# Patient Record
Sex: Male | Born: 1940 | Race: White | Hispanic: No | Marital: Married | Smoking: Never smoker
Health system: Southern US, Community
[De-identification: ages and names within clinical notes are randomized; demographics above are authoritative.]

## PROBLEM LIST (undated history)

## (undated) DIAGNOSIS — C61 Malignant neoplasm of prostate: Secondary | ICD-10-CM

## (undated) DIAGNOSIS — D649 Anemia, unspecified: Secondary | ICD-10-CM

## (undated) DIAGNOSIS — M1990 Unspecified osteoarthritis, unspecified site: Secondary | ICD-10-CM

## (undated) DIAGNOSIS — G709 Myoneural disorder, unspecified: Secondary | ICD-10-CM

## (undated) DIAGNOSIS — C189 Malignant neoplasm of colon, unspecified: Secondary | ICD-10-CM

## (undated) DIAGNOSIS — N529 Male erectile dysfunction, unspecified: Secondary | ICD-10-CM

## (undated) DIAGNOSIS — K4090 Unilateral inguinal hernia, without obstruction or gangrene, not specified as recurrent: Secondary | ICD-10-CM

## (undated) DIAGNOSIS — I739 Peripheral vascular disease, unspecified: Secondary | ICD-10-CM

## (undated) HISTORY — DX: Male erectile dysfunction, unspecified: N52.9

## (undated) HISTORY — PX: BACK SURGERY: SHX140

## (undated) HISTORY — DX: Malignant neoplasm of prostate: C61

## (undated) HISTORY — PX: TONSILLECTOMY: SUR1361

---
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
## 2002-03-27 HISTORY — PX: SPINE SURGERY: SHX786

## 2002-06-01 ENCOUNTER — Encounter: Admission: RE | Admit: 2002-06-01 | Discharge: 2002-06-01 | Payer: Self-pay | Admitting: Family Medicine

## 2002-06-01 ENCOUNTER — Encounter: Payer: Self-pay | Admitting: Family Medicine

## 2002-06-05 ENCOUNTER — Encounter: Admission: RE | Admit: 2002-06-05 | Discharge: 2002-06-05 | Payer: Self-pay | Admitting: Family Medicine

## 2002-06-23 ENCOUNTER — Encounter: Payer: Self-pay | Admitting: Neurosurgery

## 2002-06-23 ENCOUNTER — Ambulatory Visit (HOSPITAL_COMMUNITY): Admission: RE | Admit: 2002-06-23 | Discharge: 2002-06-24 | Payer: Self-pay | Admitting: Neurosurgery

## 2002-10-30 ENCOUNTER — Encounter: Payer: Self-pay | Admitting: Neurosurgery

## 2002-10-30 ENCOUNTER — Inpatient Hospital Stay (HOSPITAL_COMMUNITY): Admission: RE | Admit: 2002-10-30 | Discharge: 2002-10-31 | Payer: Self-pay | Admitting: Neurosurgery

## 2007-09-04 ENCOUNTER — Telehealth: Payer: Self-pay | Admitting: Family Medicine

## 2007-09-04 ENCOUNTER — Ambulatory Visit: Payer: Self-pay | Admitting: Family Medicine

## 2007-09-04 DIAGNOSIS — L259 Unspecified contact dermatitis, unspecified cause: Secondary | ICD-10-CM | POA: Insufficient documentation

## 2007-09-06 ENCOUNTER — Ambulatory Visit: Payer: Self-pay | Admitting: Family Medicine

## 2007-09-06 LAB — CONVERTED CEMR LAB
Bilirubin Urine: NEGATIVE
Blood in Urine, dipstick: NEGATIVE
Glucose, Urine, Semiquant: NEGATIVE
Nitrite: NEGATIVE
Protein, U semiquant: NEGATIVE
Specific Gravity, Urine: 1.02
Urobilinogen, UA: 0.2
WBC Urine, dipstick: NEGATIVE
pH: 7

## 2007-09-11 ENCOUNTER — Encounter: Payer: Self-pay | Admitting: Family Medicine

## 2007-09-11 LAB — CONVERTED CEMR LAB
ALT: 19 units/L (ref 0–53)
AST: 25 units/L (ref 0–37)
Albumin: 4.4 g/dL (ref 3.5–5.2)
Alkaline Phosphatase: 51 units/L (ref 39–117)
BUN: 25 mg/dL — ABNORMAL HIGH (ref 6–23)
Basophils Absolute: 0 10*3/uL (ref 0.0–0.1)
Basophils Relative: 0.2 % (ref 0.0–1.0)
Bilirubin, Direct: 0.1 mg/dL (ref 0.0–0.3)
CO2: 30 meq/L (ref 19–32)
Calcium: 9.6 mg/dL (ref 8.4–10.5)
Chloride: 104 meq/L (ref 96–112)
Cholesterol: 201 mg/dL (ref 0–200)
Creatinine, Ser: 0.9 mg/dL (ref 0.4–1.5)
Direct LDL: 110.6 mg/dL
Eosinophils Absolute: 0 10*3/uL (ref 0.0–0.7)
Eosinophils Relative: 0.1 % (ref 0.0–5.0)
GFR calc Af Amer: 109 mL/min
GFR calc non Af Amer: 90 mL/min
Glucose, Bld: 97 mg/dL (ref 70–99)
HCT: 41.4 % (ref 39.0–52.0)
HDL: 69.6 mg/dL (ref >39.0)
Hemoglobin: 14.3 g/dL (ref 13.0–17.0)
Lymphocytes Relative: 19.6 % (ref 12.0–46.0)
MCHC: 34.6 g/dL (ref 30.0–36.0)
MCV: 95.9 fL (ref 78.0–100.0)
Monocytes Absolute: 0.6 10*3/uL (ref 0.1–1.0)
Monocytes Relative: 5.8 % (ref 3.0–12.0)
Neutro Abs: 7.2 10*3/uL (ref 1.4–7.7)
Neutrophils Relative %: 74.3 % (ref 43.0–77.0)
PSA: 2.49 ng/mL (ref 0.10–4.00)
Platelets: 236 10*3/uL (ref 150–400)
Potassium: 3.8 meq/L (ref 3.5–5.1)
RBC: 4.31 M/uL (ref 4.22–5.81)
RDW: 11.8 % (ref 11.5–14.6)
Sodium: 142 meq/L (ref 135–145)
TSH: 0.43 microintl units/mL (ref 0.35–5.50)
Total Bilirubin: 1.3 mg/dL — ABNORMAL HIGH (ref 0.3–1.2)
Total CHOL/HDL Ratio: 2.9
Total Protein: 6.8 g/dL (ref 6.0–8.3)
Triglycerides: 54 mg/dL (ref 0–149)
VLDL: 11 mg/dL (ref 0–40)
WBC: 9.7 10*3/uL (ref 4.5–10.5)

## 2007-09-13 ENCOUNTER — Ambulatory Visit: Payer: Self-pay | Admitting: Family Medicine

## 2007-09-13 DIAGNOSIS — F528 Other sexual dysfunction not due to a substance or known physiological condition: Secondary | ICD-10-CM | POA: Insufficient documentation

## 2007-11-19 ENCOUNTER — Ambulatory Visit: Payer: Self-pay | Admitting: Family Medicine

## 2007-11-19 DIAGNOSIS — E291 Testicular hypofunction: Secondary | ICD-10-CM | POA: Insufficient documentation

## 2007-11-19 DIAGNOSIS — G5760 Lesion of plantar nerve, unspecified lower limb: Secondary | ICD-10-CM | POA: Insufficient documentation

## 2007-11-27 ENCOUNTER — Encounter: Payer: Self-pay | Admitting: Family Medicine

## 2009-11-09 ENCOUNTER — Ambulatory Visit: Payer: Self-pay | Admitting: Family Medicine

## 2009-11-09 LAB — CONVERTED CEMR LAB
Bilirubin Urine: NEGATIVE
Blood in Urine, dipstick: NEGATIVE
Glucose, Urine, Semiquant: NEGATIVE
Ketones, urine, test strip: NEGATIVE
Nitrite: NEGATIVE
Protein, U semiquant: NEGATIVE
Specific Gravity, Urine: 1.025
Urobilinogen, UA: 0.2
pH: 7

## 2009-11-15 ENCOUNTER — Ambulatory Visit: Payer: Self-pay | Admitting: Family Medicine

## 2009-11-15 DIAGNOSIS — C61 Malignant neoplasm of prostate: Secondary | ICD-10-CM | POA: Insufficient documentation

## 2009-11-15 LAB — CONVERTED CEMR LAB
ALT: 19 units/L (ref 0–53)
AST: 26 units/L (ref 0–37)
Albumin: 4.3 g/dL (ref 3.5–5.2)
Alkaline Phosphatase: 56 units/L (ref 39–117)
BUN: 20 mg/dL (ref 6–23)
Basophils Absolute: 0.1 10*3/uL (ref 0.0–0.1)
Basophils Relative: 0.7 % (ref 0.0–3.0)
Bilirubin, Direct: 0.2 mg/dL (ref 0.0–0.3)
CO2: 33 meq/L — ABNORMAL HIGH (ref 19–32)
Calcium: 9.4 mg/dL (ref 8.4–10.5)
Chloride: 101 meq/L (ref 96–112)
Cholesterol: 210 mg/dL — ABNORMAL HIGH (ref 0–200)
Creatinine, Ser: 0.8 mg/dL (ref 0.4–1.5)
Direct LDL: 112.7 mg/dL
Eosinophils Absolute: 0.4 10*3/uL (ref 0.0–0.7)
Eosinophils Relative: 4.9 % (ref 0.0–5.0)
GFR calc non Af Amer: 96.83 mL/min (ref >60)
Glucose, Bld: 85 mg/dL (ref 70–99)
HCT: 42.4 % (ref 39.0–52.0)
HDL: 91.6 mg/dL (ref >39.00)
Hemoglobin: 14.1 g/dL (ref 13.0–17.0)
Lymphocytes Relative: 22.6 % (ref 12.0–46.0)
Lymphs Abs: 1.8 10*3/uL (ref 0.7–4.0)
MCHC: 33.4 g/dL (ref 30.0–36.0)
MCV: 97.9 fL (ref 78.0–100.0)
Monocytes Absolute: 0.7 10*3/uL (ref 0.1–1.0)
Monocytes Relative: 8.8 % (ref 3.0–12.0)
Neutro Abs: 5 10*3/uL (ref 1.4–7.7)
Neutrophils Relative %: 63 % (ref 43.0–77.0)
PSA: 6.11 ng/mL — ABNORMAL HIGH (ref 0.10–4.00)
Platelets: 204 10*3/uL (ref 150.0–400.0)
Potassium: 4.6 meq/L (ref 3.5–5.1)
RBC: 4.33 M/uL (ref 4.22–5.81)
RDW: 13.4 % (ref 11.5–14.6)
Sodium: 143 meq/L (ref 135–145)
TSH: 0.69 microintl units/mL (ref 0.35–5.50)
Total Bilirubin: 0.8 mg/dL (ref 0.3–1.2)
Total CHOL/HDL Ratio: 2
Total Protein: 6.4 g/dL (ref 6.0–8.3)
Triglycerides: 32 mg/dL (ref 0.0–149.0)
VLDL: 6.4 mg/dL (ref 0.0–40.0)
WBC: 7.9 10*3/uL (ref 4.5–10.5)

## 2009-12-14 ENCOUNTER — Encounter: Payer: Self-pay | Admitting: Family Medicine

## 2009-12-28 ENCOUNTER — Encounter (INDEPENDENT_AMBULATORY_CARE_PROVIDER_SITE_OTHER): Payer: Self-pay | Admitting: *Deleted

## 2010-01-04 ENCOUNTER — Encounter: Payer: Self-pay | Admitting: Family Medicine

## 2010-01-18 ENCOUNTER — Encounter: Payer: Self-pay | Admitting: Family Medicine

## 2010-02-01 ENCOUNTER — Encounter: Payer: Self-pay | Admitting: Family Medicine

## 2010-02-23 ENCOUNTER — Encounter: Payer: Self-pay | Admitting: Family Medicine

## 2010-03-02 ENCOUNTER — Ambulatory Visit
Admission: RE | Admit: 2010-03-02 | Discharge: 2010-04-26 | Payer: Self-pay | Source: Home / Self Care | Attending: Radiation Oncology | Admitting: Radiation Oncology

## 2010-03-03 ENCOUNTER — Encounter: Payer: Self-pay | Admitting: Family Medicine

## 2010-03-14 ENCOUNTER — Encounter
Admission: RE | Admit: 2010-03-14 | Discharge: 2010-03-14 | Payer: Self-pay | Source: Home / Self Care | Attending: Urology | Admitting: Urology

## 2010-04-11 LAB — COMPREHENSIVE METABOLIC PANEL
ALT: 27 U/L (ref 0–53)
AST: 26 U/L (ref 0–37)
Albumin: 4.3 g/dL (ref 3.5–5.2)
Alkaline Phosphatase: 57 U/L (ref 39–117)
BUN: 18 mg/dL (ref 6–23)
CO2: 33 mEq/L — ABNORMAL HIGH (ref 19–32)
Calcium: 10.1 mg/dL (ref 8.4–10.5)
Chloride: 103 mEq/L (ref 96–112)
Creatinine, Ser: 0.87 mg/dL (ref 0.4–1.5)
GFR calc Af Amer: >60 mL/min (ref >60)
GFR calc non Af Amer: >60 mL/min (ref >60)
Glucose, Bld: 76 mg/dL (ref 70–99)
Potassium: 4 mEq/L (ref 3.5–5.1)
Sodium: 144 mEq/L (ref 135–145)
Total Bilirubin: 1 mg/dL (ref 0.3–1.2)
Total Protein: 6.9 g/dL (ref 6.0–8.3)

## 2010-04-11 LAB — CBC
HCT: 43.9 % (ref 39.0–52.0)
Hemoglobin: 14.7 g/dL (ref 13.0–17.0)
MCH: 31.8 pg (ref 26.0–34.0)
MCHC: 33.5 g/dL (ref 30.0–36.0)
MCV: 95 fL (ref 78.0–100.0)
Platelets: 236 10*3/uL (ref 150–400)
RBC: 4.62 MIL/uL (ref 4.22–5.81)
RDW: 12.5 % (ref 11.5–15.5)
WBC: 5.9 10*3/uL (ref 4.0–10.5)

## 2010-04-11 LAB — PROTIME-INR
INR: 1.01 (ref 0.00–1.49)
Prothrombin Time: 13.5 seconds (ref 11.6–15.2)

## 2010-04-11 LAB — APTT: aPTT: 35 seconds (ref 24–37)

## 2010-04-14 ENCOUNTER — Encounter: Payer: Self-pay | Admitting: Family Medicine

## 2010-04-14 ENCOUNTER — Ambulatory Visit
Admission: RE | Admit: 2010-04-14 | Discharge: 2010-04-14 | Payer: Self-pay | Source: Home / Self Care | Attending: Urology | Admitting: Urology

## 2010-04-19 NOTE — Op Note (Signed)
  NAME:  Oscar Sparks, [name]                ACCOUNT NO.:  [id number]  MEDICAL RECORD NO.:  [id number]          PATIENT TYPE:  AMB  LOCATION:  [facility]                         FACILITY:  [facility]  PHYSICIAN:  Rogerio Boutelle I. Patsi Sears, M.D.DATE OF BIRTH:  31-Oct-1940  DATE OF PROCEDURE: DATE OF DISCHARGE:                              OPERATIVE REPORT   PREOPERATIVE DIAGNOSIS:  Adenocarcinoma of the prostate.  POSTOPERATIVE DIAGNOSIS:  Adenocarcinoma of the prostate.  OPERATION:  I-125 radioactive seed implantation.  SURGEON:  Lizet Kelso I. Patsi Sears, M.D.  RADIATION THERAPIST:  Maryln Gottron, M.D.  PREPARATION:  After appropriate preanesthesia, the patient was brought to the operating room, placed on the operating table in dorsal supine position where general LMA anesthesia was induced.  He was then re- placed in the dorsal lithotomy position where the pubis was prepped with Betadine solution and draped in usual fashion.  REVIEW OF HISTORY:  This 70 year old male has had PSA of 8.25 and history of carcinoma of the prostate in multiple areas including the right apex and right mid prostate, right base, left apex, left lateral prostate and left mid base.  All his cancers are Gleason 3+3=6, in 6/12 biopsies.  He has no voiding symptoms and gland size of 33.6 cc.  PROCEDURE DETAILS:  With the patient in the lithotomy position, a total number of 33 needles were placed in the prostate, with 145 Gy total radiation dose delivered in 67 seeds.  The patient tolerated the procedure well.  Cystourethroscopy at the end of procedure revealed no seeds in the urethra or the bladder.  There was no blood in the bladder. Foley catheter was placed.  The patient was given IV Toradol, awakened and taken to recovery room in good condition.     Estephanie Hubbs I. Patsi Sears, M.D.     SIT/MEDQ  D:  04/14/2010  T:  04/14/2010  Job:  [id number]  Electronically Signed by Jethro Bolus M.D. on 04/19/2010 01:43:50 PM

## 2010-04-24 LAB — CONVERTED CEMR LAB: Testosterone: 394.27 ng/dL (ref 350.00–890)

## 2010-04-26 NOTE — Consult Note (Signed)
Summary: [facility]   Imported By: Maryln Gottron 12/21/2009 12:47:30  _____________________________________________________________________  External Attachment:    Type:   Image     Comment:   External Document

## 2010-04-26 NOTE — Letter (Signed)
Summary: [facility]  [facility]   Imported By: Maryln Gottron 03/01/2010 11:01:48  _____________________________________________________________________  External Attachment:    Type:   Image     Comment:   External Document

## 2010-04-26 NOTE — Consult Note (Signed)
Summary: Dr Charlsie Merles note  Dr Charlsie Merles note   Imported By: Kassie Mends 12/09/2007 13:28:43  _____________________________________________________________________  External Attachment:    Type:   Image     Comment:   Dr Charlsie Merles note

## 2010-04-26 NOTE — Assessment & Plan Note (Signed)
Summary: new acute poison sumac?/mhf   Vital Signs:  Patient Profile:   70 Years Old Male Height:     71.5 inches Weight:      156 pounds Temp:     98.0 degrees F oral Pulse rate:   71 / minute Pulse rhythm:   regular BP sitting:   119 / 78  (left arm) Cuff size:   regular  Vitals Entered By: Alfred Levins, CMA (September 04, 2007 11:31 AM)                 Chief Complaint:  establish and poison ivy.  History of Present Illness: 70 yr old male to re- establish with [place] after an absence of 5 years. Has been well for the most part. After working in his yard last weekend he developed an itchy rash over both arms and both legs. Has used Benadryl and Calamine.     Current Allergies (reviewed today): No known allergies   Past Medical History:    Reviewed history and no changes required:       Unremarkable  Past Surgical History:    Reviewed history and no changes required:       Lumbar spine 2004 per Dr. Newell Coral       Tonsillectomy   Family History:    Reviewed history and no changes required:       Family History Breast cancer 1st degree relative <50  Social History:    Reviewed history and no changes required:       Married       Never Smoked       Alcohol use-no       Drug use-no       Regular exercise-no   Risk Factors:  Tobacco use:  never Drug use:  no Alcohol use:  no Exercise:  no   Review of Systems  The patient denies anorexia, fever, weight loss, weight gain, vision loss, decreased hearing, hoarseness, chest pain, syncope, dyspnea on exertion, peripheral edema, prolonged cough, headaches, hemoptysis, abdominal pain, melena, hematochezia, severe indigestion/heartburn, hematuria, incontinence, genital sores, muscle weakness, suspicious skin lesions, transient blindness, difficulty walking, depression, unusual weight change, abnormal bleeding, enlarged lymph nodes, angioedema, breast masses, and testicular masses.     Physical Exam  General:  Well-developed,well-nourished,in no acute distress; alert,appropriate and cooperative throughout examination Skin:     diffuse red papulovessicular rash as above    Impression & Recommendations:  Problem # 1:  CONTACT DERMATITIS (ICD-692.9)  His updated medication list for this problem includes:    Sterapred Ds 12 Day 10 Mg Tabs (Prednisone) .Marland Kitchen... As directed   Complete Medication List: 1)  Sterapred Ds 12 Day 10 Mg Tabs (Prednisone) .... As directed   Patient Instructions: 1)  Add Aveeno tub soaks. Will set up cpx soon.   Prescriptions: STERAPRED DS 12 DAY 10 MG  TABS (PREDNISONE) as directed  #1 x 0   Entered and Authorized by:   Nelwyn Salisbury MD   Signed by:   Nelwyn Salisbury MD on 09/04/2007   Method used:   Electronically sent to ...       [facility]  [address]  [phone]*       [address]       [place], [place]  [address]       Ph: [phone] or [phone]       Fax: [phone]   RxID:   [id number]  ]

## 2010-04-26 NOTE — Letter (Signed)
Summary: [facility] Referral (unable to schedule) Notification  [facility]  [address] [place], [place] [address]   Phone: [phone]  Fax: [phone]      December 28, 2009 Oscar Sparks [birth year] MRN: [id number]   [facility] [address] [place], [place]  [address]   Dear Dr. Clent Ridges:   Thank you for your kind referral of the above patient. We have attempted to schedule the recommended Colonoscopy but have been unable to schedule because:  _x_ The patient was not available by phone and/or has not returned our calls.  __ The patient declined to schedule the procedure at this time.  We appreciate the referral and hope that we will have the opportunity to treat this patient in the future.    Sincerely,   [facility]  Vania Rea. Jarold Motto M.D. Hedwig Morton. Juanda Chance M.D. Venita Lick. Russella Dar M.D. Wilhemina Bonito. Marina Goodell M.D. Barbette Hair. Arlyce Dice M.D. Iva Boop M.D. Cheron Every.D.

## 2010-04-26 NOTE — Letter (Signed)
Summary: Lipid Letter  [facility]  [address] [place], [place] [address]   Phone: [phone]  Fax: [phone]    09/11/2007  Orvil Feil [address] [place], [place]  [address]  Dear Oscar Sparks:  We have carefully reviewed your last lipid profile from 09/06/2007 and the results are noted below with a summary of recommendations for lipid management.    Cholesterol:       201     Goal: <200   HDL "good" Cholesterol:   34.7     Goal: >39   LDL "bad" Cholesterol:   110.6     Goal: <100   Triglycerides:       54     Goal: <149        TLC Diet (Therapeutic Lifestyle Change): Saturated Fats & Transfatty acids should be kept < 7% of total calories ***Reduce Saturated Fats Polyunstaurated Fat can be up to 10% of total calories Monounsaturated Fat Fat can be up to 20% of total calories Total Fat should be no greater than 25-35% of total calories Carbohydrates should be 50-60% of total calories Protein should be approximately 15% of total calories Fiber should be at least 20-30 grams a day ***Increased fiber may help lower LDL Total Cholesterol should be < 200mg /day Consider adding plant stanol/sterols to diet (example: Benacol spread) ***A higher intake of unsaturated fat may reduce Triglycerides and Increase HDL    Adjunctive Measures (may lower LIPIDS and reduce risk of Heart Attack) include: Aerobic Exercise (20-30 minutes 3-4 times a week) Limit Alcohol Consumption Weight Reduction Aspirin 75-81 mg a day by mouth (if not allergic or contraindicated) Vitamin E 400 IU a day by mouth Folic Acid 1mg  a day by mouth Dietary Fiber 20-30 grams a day by mouth   If you have any questions, please call. We appreciate being able to work with you.   Sincerely,    [facility]

## 2010-04-26 NOTE — Letter (Signed)
Summary: [facility]  [facility]   Imported By: Maryln Gottron 01/24/2010 10:29:31  _____________________________________________________________________  External Attachment:    Type:   Image     Comment:   External Document

## 2010-04-26 NOTE — Assessment & Plan Note (Signed)
Summary: cpx/njr   Vital Signs:  Patient profile:   70 year old male Weight:      149 pounds BMI:     20.57 BP sitting:   116 / 64  (left arm) Cuff size:   regular  Vitals Entered By: Raechel Ache, RN (November 15, 2009 10:14 AM) CC: CPX, labs done. C/o weight loss.   History of Present Illness: 70 yr old man for a cpx. He has not seen [place] for 2 years. he feels fine but he has lost some weight. By our records he has lost 10 lbs in the past 2 years. His appetite is good, he eats a well balanced diet, and he exercises regularly. He walks about 4-5 miles every day. When we gave him a cpx 2 years ago, he declined a colonoscopy, but he is interested in this now. Of note, his PSA has tripled in the past 2 years, and I am concerned that his weight loss could be related to a malignancy.   Allergies (verified): No Known Drug Allergies  Past History:  Past Medical History: ED low testosterone sees Dr. Nita Sells for dermatology exams  Past Surgical History: Reviewed history from 09/04/2007 and no changes required. Lumbar spine 2004 per Dr. Newell Coral Tonsillectomy  Family History: Reviewed history from 09/04/2007 and no changes required. Family History Breast cancer 1st degree relative <50  Social History: Reviewed history from 09/04/2007 and no changes required. Married Never Smoked Alcohol use-no Drug use-no Regular exercise-no  Review of Systems  The patient denies anorexia, fever, weight gain, vision loss, decreased hearing, hoarseness, chest pain, syncope, dyspnea on exertion, peripheral edema, prolonged cough, headaches, hemoptysis, abdominal pain, melena, hematochezia, severe indigestion/heartburn, hematuria, incontinence, genital sores, muscle weakness, suspicious skin lesions, transient blindness, difficulty walking, depression, unusual weight change, abnormal bleeding, enlarged lymph nodes, angioedema, breast masses, and testicular masses.    Physical Exam  General:   thin, alert Head:  Normocephalic and atraumatic without obvious abnormalities. No apparent alopecia or balding. Eyes:  No corneal or conjunctival inflammation noted. EOMI. Perrla. Funduscopic exam benign, without hemorrhages, exudates or papilledema. Vision grossly normal. Ears:  External ear exam shows no significant lesions or deformities.  Otoscopic examination reveals clear canals, tympanic membranes are intact bilaterally without bulging, retraction, inflammation or discharge. Hearing is grossly normal bilaterally. Nose:  External nasal examination shows no deformity or inflammation. Nasal mucosa are pink and moist without lesions or exudates. Mouth:  Oral mucosa and oropharynx without lesions or exudates.  Teeth in good repair. Neck:  No deformities, masses, or tenderness noted. Chest Wall:  No deformities, masses, tenderness or gynecomastia noted. Lungs:  Normal respiratory effort, chest expands symmetrically. Lungs are clear to auscultation, no crackles or wheezes. Heart:  Normal rate and regular rhythm. S1 and S2 normal without gallop, murmur, click, rub or other extra sounds. 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 Rectal:  No external abnormalities noted. Normal sphincter tone. No rectal masses or tenderness. Heme neg. Genitalia:  Testes bilaterally descended without nodularity, tenderness or masses. No scrotal masses or lesions. No penis lesions or urethral discharge. Prostate:  overall size is normal but it is firm, and the right lobe is slightly larger than the left. There is a firm nodule on the right also. Not tender Msk:  No deformity or scoliosis noted of thoracic or lumbar spine.   Pulses:  R and L carotid,radial,femoral,dorsalis pedis and posterior  tibial pulses are full and equal bilaterally Extremities:  No clubbing, cyanosis, edema, or deformity noted with normal full  range of motion of all joints.   Neurologic:  No cranial nerve deficits noted. Station and gait are normal. Plantar reflexes are down-going bilaterally. DTRs are symmetrical throughout. Sensory, motor and coordinative functions appear intact. Skin:  Intact without suspicious lesions or rashes Cervical Nodes:  No lymphadenopathy noted Axillary Nodes:  No palpable lymphadenopathy Inguinal Nodes:  No significant adenopathy Psych:  Cognition and judgment appear intact. Alert and cooperative with normal attention span and concentration. No apparent delusions, illusions, hallucinations   Impression & Recommendations:  Problem # 1:  WELL ADULT EXAM (ICD-V70.0)  Orders: Hemoccult Guaiac-1 spec.(in office) (82270) EKG w/ Interpretation (93000)  Complete Medication List: 1)  Fish Oil 1000 Mg Caps (Omega-3 fatty acids) .... Once daily 2)  Glucosamine Chondroitin Complx Caps (Glucosamine-chondroit-biofl-mn) .... Two times a day 3)  Viagra 100 Mg Tabs (Sildenafil citrate) .... As needed  Other Orders: Urology Referral (Urology) Gastroenterology Referral (GI)  Patient Instructions: 1)  Will refer to Urology to evaluate his increased PSA.  2)  Schedule a colonoscopy/ sigmoidoscopy to help detect colon cancer.  Prescriptions: VIAGRA 100 MG TABS (SILDENAFIL CITRATE) as needed  #10 x 11   Entered and Authorized by:   Nelwyn Salisbury MD   Signed by:   Nelwyn Salisbury MD on 11/15/2009   Method used:   Electronically to        [facility]  [phone]* (retail)       [address]       [place], [place]  [address]       Ph: [phone] or [phone]       Fax: [phone]   RxID:   [id number]

## 2010-04-26 NOTE — Letter (Signed)
Summary: [facility]  [facility]   Imported By: Maryln Gottron 01/11/2010 11:07:57  _____________________________________________________________________  External Attachment:    Type:   Image     Comment:   External Document

## 2010-04-26 NOTE — Assessment & Plan Note (Signed)
Summary: cpx/ok per doc/njr   Vital Signs:  Patient Profile:   70 Years Old Male Height:     71.5 inches Weight:      158 pounds Temp:     98.0 degrees F oral Pulse rate:   72 / minute Pulse rhythm:   regular BP sitting:   98 / 72  (left arm) Cuff size:   regular  Vitals Entered By: Alfred Levins, CMA (September 13, 2007 9:22 AM)                 Chief Complaint:  cpx.  History of Present Illness: 70 yr old male for cpx. He has concerns over the past year over some weight loss and some loss of libido. His desire for sex is down, and he has had some erection difficulties as well. He continues to exercise and walks 10 miles a day. He eats well. Has lost some strength and some muscle mass in spite of this. All his cpx labs are normal today.    Current Allergies (reviewed today): No known allergies   Past Medical History:    Reviewed history from 09/04/2007 and no changes required:       Unremarkable       sees a dermatologist yearly  Past Surgical History:    Reviewed history from 09/04/2007 and no changes required:       Lumbar spine 2004 per Dr. Newell Coral       Tonsillectomy   Family History:    Reviewed history from 09/04/2007 and no changes required:       Family History Breast cancer 1st degree relative <50  Social History:    Reviewed history from 09/04/2007 and no changes required:       Married       Never Smoked       Alcohol use-no       Drug use-no       Regular exercise-no    Review of Systems  The patient denies anorexia, fever, weight gain, vision loss, decreased hearing, hoarseness, chest pain, syncope, dyspnea on exertion, peripheral edema, prolonged cough, headaches, hemoptysis, abdominal pain, melena, hematochezia, severe indigestion/heartburn, hematuria, incontinence, genital sores, muscle weakness, suspicious skin lesions, transient blindness, difficulty walking, depression, unusual weight change, abnormal bleeding, enlarged lymph nodes, angioedema,  breast masses, and testicular masses.     Physical Exam  General:     Well-developed,well-nourished,in no acute distress; alert,appropriate and cooperative throughout examination Head:     Normocephalic and atraumatic without obvious abnormalities. No apparent alopecia or balding. Eyes:     No corneal or conjunctival inflammation noted. EOMI. Perrla. Funduscopic exam benign, without hemorrhages, exudates or papilledema. Vision grossly normal. Ears:     External ear exam shows no significant lesions or deformities.  Otoscopic examination reveals clear canals, tympanic membranes are intact bilaterally without bulging, retraction, inflammation or discharge. Hearing is grossly normal bilaterally. Nose:     External nasal examination shows no deformity or inflammation. Nasal mucosa are pink and moist without lesions or exudates. Mouth:     Oral mucosa and oropharynx without lesions or exudates.  Teeth in good repair. Neck:     No deformities, masses, or tenderness noted. Chest Wall:     No deformities, masses, tenderness or gynecomastia noted. Lungs:     Normal respiratory effort, chest expands symmetrically. Lungs are clear to auscultation, no crackles or wheezes. Heart:     Normal rate and regular rhythm. S1 and S2 normal without gallop, murmur, click, rub  or other extra sounds. EKG normal except for some first degree AV block. Abdomen:     Bowel sounds positive,abdomen soft and non-tender without masses, organomegaly or hernias noted. Rectal:     No external abnormalities noted. Normal sphincter tone. No rectal masses or tenderness. Heme neg. Genitalia:     Testes bilaterally descended without nodularity, tenderness or masses. No scrotal masses or lesions. No penis lesions or urethral discharge. Prostate:     Prostate gland firm and smooth, no enlargement, nodularity, tenderness, mass, asymmetry or induration. Msk:     No deformity or scoliosis noted of thoracic or lumbar spine.     Pulses:     R and L carotid,radial,femoral,dorsalis pedis and posterior tibial pulses are full and equal bilaterally Extremities:     No clubbing, cyanosis, edema, or deformity noted with normal full range of motion of all joints.   Neurologic:     No cranial nerve deficits noted. Station and gait are normal. Plantar reflexes are down-going bilaterally. DTRs are symmetrical throughout. Sensory, motor and coordinative functions appear intact. Skin:     Intact without suspicious lesions or rashes Cervical Nodes:     No lymphadenopathy noted Axillary Nodes:     No palpable lymphadenopathy Inguinal Nodes:     No significant adenopathy Psych:     Cognition and judgment appear intact. Alert and cooperative with normal attention span and concentration. 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 As needed   Complete Medication List: 1)  Cialis 20 Mg Tabs (Tadalafil) .... As needed   Patient Instructions: 1)  Seems quite healthy, but he may well have had a drop in his testosterone level over the past year. Check a level today. Try samples for Cialis.    Prescriptions: CIALIS 20 MG  TABS (TADALAFIL) as needed  #3 x 0   Entered and Authorized by:   Nelwyn Salisbury MD   Signed by:   Nelwyn Salisbury MD on 09/13/2007   Method used:   Samples Given   RxID:   [id number]  ]

## 2010-04-26 NOTE — Letter (Signed)
[facility]   Imported By: Sherian Rein 02/09/2010 09:44:16  _____________________________________________________________________  External Attachment:    Type:   Image     Comment:   External Document

## 2010-04-26 NOTE — Progress Notes (Signed)
Summary: WORK IN CPX  Phone Note Call from Patient Call back at Home Phone [phone] Call back at [phone]   Caller: Patient Call For: DR FRY Summary of Call: PT WOULD LIKE TO BE WORK IN FOR CPX ASAP. Initial call taken by: Heron Sabins,  September 04, 2007 12:17 PM  Follow-up for Phone Call        ok Follow-up by: Nelwyn Salisbury MD,  September 04, 2007 12:21 PM  Additional Follow-up for Phone Call Additional follow up Details #1::        lmom at cell# Additional Follow-up by: Heron Sabins,  September 05, 2007 12:48 PM    Additional Follow-up for Phone Call Additional follow up Details #2::    cpx labs 09-06-07 9am cpx 09-13-07 9.45a Follow-up by: Heron Sabins,  September 05, 2007 4:50 PM

## 2010-04-26 NOTE — Assessment & Plan Note (Signed)
Summary: talk with Dr Bobbye Riggs   Vital Signs:  Patient Profile:   70 Years Old Male Height:     71.5 inches Weight:      159 pounds Temp:     98.1 degrees F oral Pulse rate:   64 / minute Pulse rhythm:   regular BP sitting:   112 / 66  (left arm) Cuff size:   regular  Vitals Entered By: Alfred Levins, CMA (November 19, 2007 8:47 AM)                 Chief Complaint:  rt foot pain.  History of Present Illness: Here for a painful swelling in the right foot at the 2nd MTP. This has been present for 4 months. No trauma. He does wear supportive shoes. He relates that he is pleased with the Androgel and the Cialis.     Current Allergies: No known allergies   Past Medical History:    Reviewed history from 09/13/2007 and no changes required:       ED       low testosterone       sees a dermatologist yearly     Review of Systems  The patient denies anorexia, fever, weight loss, weight gain, vision loss, decreased hearing, hoarseness, chest pain, syncope, dyspnea on exertion, peripheral edema, prolonged cough, headaches, hemoptysis, abdominal pain, melena, hematochezia, severe indigestion/heartburn, hematuria, incontinence, genital sores, muscle weakness, suspicious skin lesions, transient blindness, difficulty walking, depression, unusual weight change, abnormal bleeding, enlarged lymph nodes, angioedema, breast masses, and testicular masses.     Physical Exam  General:     Well-developed,well-nourished,in no acute distress; alert,appropriate and cooperative throughout examination Msk:     right foot is tender and mildly swollen between the 2nd and 3rd MTP joints.     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 As needed 2)  Androgel Pump 1 % Gel (Testosterone) .... 5g every day   Patient Instructions: 1)  refer to Podiatry   Prescriptions: CIALIS 20 MG  TABS (TADALAFIL) as needed  #10 x 11   Entered and Authorized by:   Nelwyn Salisbury MD   Signed by:   Alfred Levins, CMA on 11/19/2007   Method used:   Electronically to        [facility]  [phone]* (retail)       [address]       [place], [place]  [address]       Ph: [phone] or [phone]       Fax: [phone]   RxID:   [id number]  ]

## 2010-04-27 ENCOUNTER — Ambulatory Visit: Payer: PRIVATE HEALTH INSURANCE | Admitting: Radiation Oncology

## 2010-04-28 NOTE — Letter (Signed)
Summary: [facility]  [facility]   Imported By: Maryln Gottron 04/21/2010 14:25:48  _____________________________________________________________________  External Attachment:    Type:   Image     Comment:   External Document

## 2010-04-28 NOTE — Letter (Signed)
Summary: [facility]  [facility]   Imported By: Lennie Odor 03/15/2010 14:21:50  _____________________________________________________________________  External Attachment:    Type:   Image     Comment:   External Document

## 2010-05-18 ENCOUNTER — Ambulatory Visit: Payer: PRIVATE HEALTH INSURANCE | Attending: Radiation Oncology | Admitting: Radiation Oncology

## 2010-05-18 DIAGNOSIS — C61 Malignant neoplasm of prostate: Secondary | ICD-10-CM | POA: Insufficient documentation

## 2010-08-12 NOTE — Op Note (Signed)
NAME:  Oscar Sparks, Oscar Sparks                          ACCOUNT NO.:  [id number]   MEDICAL RECORD NO.:  [id number]                   PATIENT TYPE:  INP   LOCATION:  NA                                   FACILITY:  [facility]   PHYSICIAN:  Hewitt Shorts, M.D.            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   POSTOPERATIVE DIAGNOSIS:  Right L4-5 lumbar disk herniation.   OPERATION PERFORMED:  Right L4 hemilaminectomy and microdiskectomy with  microdissection.   SURGEON:  Hewitt Shorts, M.D.   ASSISTANT:  Payton Doughty, M.D.   ANESTHESIA:  General endotracheal.   INDICATIONS FOR PROCEDURE:  The patient is a 70 year old man who presented  with a right lumbar radiculopathy.  He was found by MRI scan to have a  lumbar disk herniation that is located on the right side behind the body of  L4 compressing the L4 nerve root as it exited into the L4-5 nerve root  foramen.  Decision was made to proceed with hemilaminectomy and  microdiskectomy.   DESCRIPTION OF PROCEDURE:  The patient was brought to the operating room and  placed under general endotracheal anesthesia.  The patient was turned to a  prone position and the lumbar region was prepped with Betadine soap and  solution and draped in sterile fashion.  The midline was infiltrated with  local anesthetic with epinephrine.  An x-ray was taken.  The L4 level  identified and a midline incision was made and carried down through the  subcutaneous tissue.  Bipolar cautery and electrocautery were used to  maintain hemostasis.  Dissection was carried down to the lumbar fascia which  was incised on the right side of midline and the paraspinal muscles were  dissected from the spinous process and lamina in subperiosteal fashion.  The  L4 lamina was identified.  An x-ray was taken to confirm the  localization.  The operating microscope was draped and brought into the field to provide  additional magnification, illumination and visualization and the remainder  of the procedure was performed using microdissection and microsurgical  technique.  A hemilaminectomy was performed using the [facility] Max drill and  Kerrison punches.  We identified the thecal sac and then explored the  epidural space and identified the disk herniation.  It was located ventral  to the right L4 nerve root.  The disk herniation was removed in a piecemeal  fashion.  All loose fragments of disk material were removed and good  decompression of the right L4 nerve root was achieved.  We did not enter  into the disk space.  There was no obvious rent in the disk space.  Once the  diskectomy was completed, hemostasis was established with the use of bipolar  cautery as well as  Gelfoam soaked in thrombin.  All the Gelfoam, though, was  removed prior to closure.  We did instill 2 cc of fentanyl and 80 mg of Depo-  Medrol in the epidural space and then proceeded with closure.  The deep  fascia was closed with interrupted undyed 1 Vicryl suture and subcutaneous  and subcuticular layer were closed with interrupted inverted 2-0 undyed  Vicryl sutures and  the skin edges were reapproximated with Dermabond. The patient tolerated the  procedure well.  The estimated blood loss was 50 cc.  Sponge and needle  counts were correct.  Following surgery, the patient was turned back to  supine position, reversed from anesthetic, extubated and transferred to  recovery room for further care.                                                Hewitt Shorts, M.D.    RWN/MEDQ  D:  06/23/2002  T:  06/23/2002  Job:  [id number]

## 2010-08-12 NOTE — Op Note (Signed)
NAME:  Oscar Sparks, Oscar Sparks                          ACCOUNT NO.:  [id number]   MEDICAL RECORD NO.:  [id number]                   PATIENT TYPE:  INP   LOCATION:  3011                                 FACILITY:  [facility]   PHYSICIAN:  Hewitt Shorts, M.D.            DATE OF BIRTH:  October 05, 1940   DATE OF PROCEDURE:  10/30/2002  DATE OF DISCHARGE:                                 OPERATIVE REPORT   PREOPERATIVE DIAGNOSIS:  Recurrent right L4-L5 lumbar herniated disc.   POSTOPERATIVE DIAGNOSIS:  Recurrent right L4-L5 lumbar herniated disc.   PROCEDURE:  Right L4 hemilaminectomy and microdiscectomy with  microdissection.   SURGEON:  Hewitt Shorts, M.D.   ASSISTANT:  Payton Doughty, M.D.   ANESTHESIA:  General endotracheal anesthesia.   INDICATIONS FOR PROCEDURE:  The patient is a 70 year old man who presented  with a recurrent right L4-L5 lumbar disc herniation with a free fragment  that has migrated rostrally into the right L4-L5 nerve root foramen directly  compressing the right L4 nerve root.  The decision was made to proceed with  elective hemilaminectomy and microdiscectomy.   PROCEDURE:  The patient was brought to the operating room and placed under  general endotracheal anesthesia.  The patient was turned to the prone  position and the lumbar region was prepped with DuraPrep and draped in a  sterile fashion.  The previous midline was reopened, dissection was carried  down through the subcutaneous tissue to the lumbar fascia which was incised  on the right side of the midline.  The paraspinal muscles were dissected  from the spinous processes and lamina in a subperiosteal fashion.  The  previous hemilaminectomy was identified and we dissected the scar tissues  from the margins of that hemilaminectomy.  We identified the thecal sac.  The medial facetectomy was slightly extended and we were able to identify  the right L4 nerve roots, the pedicles of the right L4 vertebral body,  and  the right L4-L5 disc space.  An x-ray was taken to help with localization.  The microscope was draped and brought onto the field to provide illumination  and visualization and our dissection was continued using microdissection and  microsurgical technique.  We identified the disc herniation ventral to the  right L4 nerve roots.  We incised over the scar tissue and a fragment of  disc extruded and we were able to further dissect within the foramen and  remove additional fragments of disc herniation and with this, we were able  to decompress the right L4 nerve roots.  Good decompression was achieved.  All loose fragments of disc material were removed from the epidural space  and the foramen.  We did not enter into the disc space and notably, we did  not enter into the disc space at the time of his previous surgery.  Hemostasis was established using bipolar cautery.  Once the discectomy was  completed, we instilled 2 cc of Fentanyl and 80 mg Depo-Medrol into the  epidural space around the nerve root and thecal sac and then proceeded with  closure.  The deep fascia was closed with interrupted undyed #1 Vicryl  suture, the subcutaneous and subcuticular layer was closed with interrupted  inverted 2-0 Vicryl sutures, and the skin  was reapproximated with Dermabond.  The patient tolerated the procedure  well.  Estimated blood loss was 50 mL.  Sponge counts were correct.  Following surgery, the patient was turned back to the supine position,  reversed from the anesthetic, extubated, and transferred to the recovery  room for further care.                                               Hewitt Shorts, M.D.    RWN/MEDQ  D:  10/30/2002  T:  10/30/2002  Job:  [id number]

## 2010-10-07 ENCOUNTER — Other Ambulatory Visit: Payer: Self-pay | Admitting: Family Medicine

## 2010-10-07 ENCOUNTER — Telehealth: Payer: Self-pay | Admitting: Family Medicine

## 2010-10-07 NOTE — Telephone Encounter (Signed)
Please advise 

## 2010-10-07 NOTE — Telephone Encounter (Signed)
Pt is requesting a refill on Prednisone, however Dr. Fabian Sharp said that we could call in some cream. I spoke with pt and he declined.

## 2010-10-07 NOTE — Telephone Encounter (Signed)
Per Dr. Fabian Sharp- needs a office visit first.

## 2010-10-07 NOTE — Telephone Encounter (Signed)
Pt had a Rx for Prednisone for poison ivy. He is out, but he has gotten into poison ivy again while working in the yard. The bottle says no refills, but he is wondering if we can authorize a refill today so he can go to the pharmacy & pick it up. I also recommended that he call the pharmacy and ask them to submit a refill auth request to [place].

## 2010-10-07 NOTE — Telephone Encounter (Signed)
Script was denied by Dr. Fabian Sharp, however she did offer to call in some cream. Pt declined that and wanted me to forward this request to Dr. Clent Ridges.

## 2010-10-10 ENCOUNTER — Telehealth: Payer: Self-pay | Admitting: Family Medicine

## 2010-10-10 NOTE — Telephone Encounter (Signed)
Refill request for Prednison 10 mg 12 day pack. Pt stated that he was working in the yard and must have come into contact with poison ivy. Pt would like a call when & if we can handle this.

## 2010-10-11 NOTE — Telephone Encounter (Signed)
Call in one pack as noted

## 2010-10-12 MED ORDER — PREDNISONE (PAK) 10 MG PO TABS: ORAL_TABLET | Freq: Every day | ORAL | Status: DC

## 2010-10-12 NOTE — Telephone Encounter (Signed)
Call in a 12 day taper pack to take as directed

## 2010-10-12 NOTE — Telephone Encounter (Signed)
Left message on machine for patient

## 2011-03-27 ENCOUNTER — Encounter: Payer: Self-pay | Admitting: *Deleted

## 2011-03-27 NOTE — Progress Notes (Signed)
03/03/2010 I-PSS results=00000001 score+1

## 2011-11-25 ENCOUNTER — Other Ambulatory Visit: Payer: Self-pay | Admitting: Family Medicine

## 2011-11-28 NOTE — Telephone Encounter (Signed)
Pt last seen 11/15/09. Pls advise.

## 2011-12-28 ENCOUNTER — Telehealth: Payer: Self-pay | Admitting: Family Medicine

## 2011-12-28 NOTE — Telephone Encounter (Signed)
Patient called stating that he need a refill of his viagra sent to [facility]. Please assist.

## 2011-12-29 NOTE — Telephone Encounter (Signed)
He needs an OV first.

## 2012-01-01 ENCOUNTER — Ambulatory Visit (INDEPENDENT_AMBULATORY_CARE_PROVIDER_SITE_OTHER): Payer: PRIVATE HEALTH INSURANCE | Admitting: Family Medicine

## 2012-01-01 ENCOUNTER — Encounter: Payer: Self-pay | Admitting: Family Medicine

## 2012-01-01 VITALS — BP 116/76 | HR 90 | Temp 98.4°F | Wt 150.0 lb

## 2012-01-01 DIAGNOSIS — C61 Malignant neoplasm of prostate: Secondary | ICD-10-CM

## 2012-01-01 DIAGNOSIS — N529 Male erectile dysfunction, unspecified: Secondary | ICD-10-CM

## 2012-01-01 MED ORDER — SILDENAFIL CITRATE 100 MG PO TABS: 100.0000 mg | ORAL_TABLET | ORAL | Status: DC | PRN

## 2012-01-01 NOTE — Progress Notes (Signed)
  Subjective:    Patient ID: Oscar Sparks, male    DOB: December 02, 1940, 71 y.o.   MRN: [id number]  HPI Here to follow up after an absence of 2 and 1/2 years. We had noted a rapid rise in his PSA and sent him to Dr. Patsi Sears. His prostate biopsy was positive for early cancer, and he had cesium seeds implanted. This was very successful, and his PSA counts have been down ever since. His PSA from one month ago was 0.99. He feels great and has no concerns, although he asks for a refill on Viagra. His sex life had been quite curtailed during his prostate treatment, but he is now back to normal and he enjoys a healthy relationship with his wife.    Review of Systems  Constitutional: Negative.   Respiratory: Negative.   Cardiovascular: Negative.   Gastrointestinal: Negative.   Genitourinary: Negative.        Objective:   Physical Exam  Constitutional: He appears well-developed and well-nourished.  Neck: No thyromegaly present.  Cardiovascular: Normal rate, regular rhythm, normal heart sounds and intact distal pulses.   Pulmonary/Chest: Effort normal and breath sounds normal.  Lymphadenopathy:    He has no cervical adenopathy.          Assessment & Plan:  He seems to be doing well. Refilled Viagra.

## 2012-02-07 ENCOUNTER — Telehealth: Payer: Self-pay | Admitting: Family Medicine

## 2012-02-07 MED ORDER — TADALAFIL 20 MG PO TABS: 20.0000 mg | ORAL_TABLET | Freq: Every day | ORAL | Status: DC | PRN

## 2012-02-07 NOTE — Telephone Encounter (Signed)
I sent script e-scribe and left voice message for pt 

## 2012-02-07 NOTE — Telephone Encounter (Signed)
Please advise 

## 2012-02-07 NOTE — Telephone Encounter (Signed)
Call in Cialis 20 mg to take prn, #10 with 11 rf 

## 2012-02-07 NOTE — Telephone Encounter (Signed)
Caller: Kalev/Patient; Phone: [phone]; Reason for Call: Calling to report that prescribed medication (Viagra) is not having any effect on his condition.  Please call patient today between 2pm and 3pm to discuss alternate medication.

## 2012-02-07 NOTE — Addendum Note (Signed)
Addended by: Aniceto Boss A on: 02/07/2012 01:49 PM   Modules accepted: Orders

## 2012-09-19 ENCOUNTER — Observation Stay (HOSPITAL_COMMUNITY)
Admission: EM | Admit: 2012-09-19 | Discharge: 2012-09-19 | Disposition: A | Discharge status: Home / Self Care | Payer: PRIVATE HEALTH INSURANCE | Attending: Internal Medicine | Admitting: Internal Medicine

## 2012-09-19 ENCOUNTER — Encounter (HOSPITAL_COMMUNITY): Payer: Self-pay | Admitting: Emergency Medicine

## 2012-09-19 ENCOUNTER — Other Ambulatory Visit: Payer: Self-pay

## 2012-09-19 ENCOUNTER — Emergency Department (HOSPITAL_COMMUNITY): Payer: PRIVATE HEALTH INSURANCE

## 2012-09-19 DIAGNOSIS — Z79899 Other long term (current) drug therapy: Secondary | ICD-10-CM | POA: Insufficient documentation

## 2012-09-19 DIAGNOSIS — R079 Chest pain, unspecified: Secondary | ICD-10-CM

## 2012-09-19 DIAGNOSIS — R072 Precordial pain: Principal | ICD-10-CM | POA: Insufficient documentation

## 2012-09-19 DIAGNOSIS — N529 Male erectile dysfunction, unspecified: Secondary | ICD-10-CM | POA: Insufficient documentation

## 2012-09-19 DIAGNOSIS — I440 Atrioventricular block, first degree: Secondary | ICD-10-CM | POA: Insufficient documentation

## 2012-09-19 LAB — BASIC METABOLIC PANEL
BUN: 21 mg/dL (ref 6–23)
CO2: 26 mEq/L (ref 19–32)
Calcium: 9.7 mg/dL (ref 8.4–10.5)
Chloride: 104 mEq/L (ref 96–112)
Creatinine, Ser: 0.78 mg/dL (ref 0.50–1.35)
GFR calc Af Amer: >90 mL/min (ref >90)
GFR calc non Af Amer: >90 mL/min (ref >90)
Glucose, Bld: 102 mg/dL — ABNORMAL HIGH (ref 70–99)
Potassium: 4.1 mEq/L (ref 3.5–5.1)
Sodium: 139 mEq/L (ref 135–145)

## 2012-09-19 LAB — CBC WITH DIFFERENTIAL/PLATELET
Basophils Absolute: 0 10*3/uL (ref 0.0–0.1)
Basophils Relative: 1 % (ref 0–1)
Eosinophils Absolute: 0.2 10*3/uL (ref 0.0–0.7)
Eosinophils Relative: 3 % (ref 0–5)
HCT: 41.1 % (ref 39.0–52.0)
Hemoglobin: 14.1 g/dL (ref 13.0–17.0)
Lymphocytes Relative: 29 % (ref 12–46)
Lymphs Abs: 1.9 10*3/uL (ref 0.7–4.0)
MCH: 31.6 pg (ref 26.0–34.0)
MCHC: 34.3 g/dL (ref 30.0–36.0)
MCV: 92.2 fL (ref 78.0–100.0)
Monocytes Absolute: 0.6 10*3/uL (ref 0.1–1.0)
Monocytes Relative: 9 % (ref 3–12)
Neutro Abs: 3.9 10*3/uL (ref 1.7–7.7)
Neutrophils Relative %: 59 % (ref 43–77)
Platelets: 231 10*3/uL (ref 150–400)
RBC: 4.46 MIL/uL (ref 4.22–5.81)
RDW: 12.4 % (ref 11.5–15.5)
WBC: 6.6 10*3/uL (ref 4.0–10.5)

## 2012-09-19 LAB — POCT I-STAT TROPONIN I: Troponin i, poc: 0.01 ng/mL (ref 0.00–0.08)

## 2012-09-19 MED ORDER — ASPIRIN 81 MG PO CHEW
324.0000 mg | CHEWABLE_TABLET | Freq: Once | ORAL | Status: AC
  Administered 2012-09-19: 324 mg via ORAL
  Filled 2012-09-19: qty 1

## 2012-09-19 MED ORDER — ONDANSETRON HCL 4 MG/2ML IJ SOLN
4.0000 mg | Freq: Once | INTRAMUSCULAR | Status: DC
  Filled 2012-09-19: qty 2

## 2012-09-19 MED ORDER — SODIUM CHLORIDE 0.9 % IV BOLUS (SEPSIS)
1000.0000 mL | Freq: Once | INTRAVENOUS | Status: AC
  Administered 2012-09-19: 1000 mL via INTRAVENOUS

## 2012-09-19 NOTE — ED Provider Notes (Signed)
History    CSN: [id number] Arrival date & time 09/19/12  1148  First MD Initiated Contact with Patient 09/19/12 1154     Chief Complaint  Patient presents with  . Chest Pain   (Consider location/radiation/quality/duration/timing/severity/associated sxs/prior Treatment) HPI Comments: Patient presents with a chief complaint of chest pain.  He reports that he began having substernal chest pain radiating to the left anterior chest this morning while working on the yard.  He describes the pain as a pressure.  The pain lasted approximately one hour and then resolved without intervention.  He denies any chest pain at this time.  Pain associated with nausea,SOB, lightheadedness, and SOB.  All symptoms have resolved at this time.  He denies any prior cardiac history.  He reports that his PCP set him up with a Stress Test 1-2 years ago, which patient reports was normal.  No history of DM, HTN, or Hyperlipidemia.  Denies history of PE or DVT.  Denies prolonged travel or surgeries in the past 4 weeks.  Denies lower extremity edema or pain.  Patient is a 72 y.o. male presenting with chest pain. The history is provided by the patient.  Chest Pain Associated symptoms: nausea   Associated symptoms: no abdominal pain, no anxiety, no back pain, no cough, no fever, no headache, no lower extremity edema, no near-syncope, no numbness, no syncope and no weakness   Risk factors: no smoking    Past Medical History  Diagnosis Date  . ED (erectile dysfunction)   . Prostate cancer     sees Dr. Patsi Sears, received cesium seeds    Past Surgical History  Procedure Laterality Date  . Spine surgery  2004    Dr. Newell Coral  . Tonsillectomy     Family History  Problem Relation Age of Onset  . Breast cancer     History  Substance Use Topics  . Smoking status: Never Smoker   . Smokeless tobacco: Never Used  . Alcohol Use: 0.5 oz/week    1 drink(s) per week    Review of Systems  Constitutional: Negative for  fever.  Respiratory: Negative for cough.   Cardiovascular: Positive for chest pain. Negative for syncope and near-syncope.  Gastrointestinal: Positive for nausea. Negative for abdominal pain.  Musculoskeletal: Negative for back pain.  Neurological: Positive for light-headedness. Negative for weakness, numbness and headaches.  All other systems reviewed and are negative.    Allergies  Review of patient's allergies indicates no known allergies.  Home Medications   Current Outpatient Rx  Name  Route  Sig  Dispense  Refill  . B Complex-C (SUPER B COMPLEX PO)   Oral   Take by mouth.         . calcium carbonate (OS-CAL) 1250 MG chewable tablet   Oral   Chew 1 tablet by mouth daily.         . Multiple Vitamins-Minerals (MULTIVITAMIN WITH MINERALS) tablet   Oral   Take 1 tablet by mouth daily.         Marland Kitchen selenium 50 MCG TABS   Oral   Take 50 mcg by mouth daily.         . tadalafil (CIALIS) 20 MG tablet   Oral   Take 1 tablet (20 mg total) by mouth daily as needed for erectile dysfunction.   10 tablet   11   . vitamin E 400 UNIT capsule   Oral   Take 400 Units by mouth daily.  BP 116/69  Pulse 66  Temp(Src) 97.9 F (36.6 C) (Oral)  Resp 12  SpO2 100% Physical Exam  Nursing note and vitals reviewed. Constitutional: He appears well-developed and well-nourished. No distress.  HENT:  Head: Normocephalic and atraumatic.  Neck: Normal range of motion. Neck supple.  Cardiovascular: Normal rate, regular rhythm, normal heart sounds and intact distal pulses.   Pulses:      Dorsalis pedis pulses are 2+ on the right side, and 2+ on the left side.  Pulmonary/Chest: Effort normal and breath sounds normal. No respiratory distress. He has no wheezes. He has no rales. He exhibits no tenderness.  Abdominal: Soft. There is no tenderness.  Musculoskeletal: Normal range of motion.  No LE edema  Neurological: He is alert.  Skin: Skin is warm and dry. He is not  diaphoretic.  Psychiatric: He has a normal mood and affect.    ED Course  Procedures (including critical care time) Labs Reviewed  BASIC METABOLIC PANEL - Abnormal; Notable for the following:    Glucose, Bld 102 (*)    All other components within normal limits  CBC WITH DIFFERENTIAL  POCT I-STAT TROPONIN I   Dg Chest 2 View  09/19/2012   *RADIOLOGY REPORT*  Clinical Data: Chest pain  CHEST - 2 VIEW  Comparison: Prior chest x-ray 03/14/2010  Findings: The lungs are clear and well aerated.  No pleural effusion, pneumothorax, pulmonary edema or focal airspace consolidation.  Cardiac and mediastinal contours within normal limits.  Calcifications noted in the transverse aorta.  Subtle 8 mm nodular opacity projects over the lateral right base.  This may represent a prominent nipple shadow.  No acute osseous abnormality.  IMPRESSION:  1.  No acute cardiopulmonary disease.  2.  Subtle nodular opacity in the periphery of the right base may represent a prominent nipple shadow or a true pulmonary nodule. Recommend non emergent repeat PA and lateral chest x-ray with nipple markers in place.   Original Report Authenticated By: Malachy Moan, M.D.   No diagnosis found.   Date: 09/19/2012  Rate: 70  Rhythm: normal sinus rhythm  QRS Axis: normal  Intervals: PR prolonged  ST/T Wave abnormalities: nonspecific T wave changes  Conduction Disutrbances:first-degree A-V block   Narrative Interpretation:   Old EKG Reviewed: none available    MDM  Patient presents with a chief complaint of chest pain.  Patient is pain free during ED course.  No acute ischemic changes on EKG.  Troponin negative.  No acute findings on CXR.  Discussed admission with the patient.  However, the patient refused.  Discussed with patient at length the risks of going home before the work up is completed and without being monitored.  Patient demonstrated understanding and stated that he will return to the ED if the pain returns.   Strict return precautions given.     Pascal Lux Hamer, PA-C 09/19/12 [phone]

## 2012-09-19 NOTE — ED Notes (Signed)
Patient transported to X-ray 

## 2012-09-19 NOTE — ED Notes (Signed)
Pt presenting to ed with c/o chest pressure s/p working in his yard pt states sudden onset with positive nausea and lightheadedness pt denies shortness of breath and states positive headache pain

## 2012-09-19 NOTE — ED Notes (Signed)
PA at bedside.

## 2012-09-20 NOTE — ED Provider Notes (Signed)
Medical screening examination/treatment/procedure(s) were conducted as a shared visit with non-physician practitioner(s) and myself.  I personally evaluated the patient during the encounter. Chief complaint chest pain. We suggested admission to Mr. Burich.   He opted to go home. Risk and benefits discussed with patient and his wife Including the possibility of a heart attack.  Donnetta Hutching, MD 09/20/12 [phone]

## 2012-10-30 ENCOUNTER — Other Ambulatory Visit: Payer: Self-pay

## 2013-01-30 ENCOUNTER — Other Ambulatory Visit: Payer: Self-pay

## 2014-06-04 ENCOUNTER — Encounter: Payer: Self-pay | Admitting: Family Medicine

## 2014-06-04 ENCOUNTER — Ambulatory Visit (INDEPENDENT_AMBULATORY_CARE_PROVIDER_SITE_OTHER): Payer: PRIVATE HEALTH INSURANCE | Admitting: Family Medicine

## 2014-06-04 VITALS — BP 102/63 | HR 66 | Temp 98.2°F | Ht 71.5 in | Wt 144.0 lb

## 2014-06-04 DIAGNOSIS — S39011A Strain of muscle, fascia and tendon of abdomen, initial encounter: Secondary | ICD-10-CM

## 2014-06-04 DIAGNOSIS — S76219A Strain of adductor muscle, fascia and tendon of unspecified thigh, initial encounter: Secondary | ICD-10-CM

## 2014-06-04 NOTE — Progress Notes (Signed)
Pre visit review using our clinic review tool, if applicable. No additional management support is needed unless otherwise documented below in the visit note. 

## 2014-06-04 NOTE — Progress Notes (Signed)
   Subjective:    Patient ID: Oscar Sparks, male    DOB: May 18, 1940, 74 y.o.   MRN: [id number]  HPI Here for 6 months of daily pain in the left groin area which bothers him the most when going up or down steps or when getting into or out of a car. He is active and still walks 5-7 miles a day. Advil helps briefly. No back or hip problems. He has tried stretching exercises with no benefit.    Review of Systems  Constitutional: Negative.   Musculoskeletal: Positive for arthralgias and gait problem. Negative for back pain.       Objective:   Physical Exam  Constitutional: He appears well-developed and well-nourished.  Musculoskeletal:  Tender over the proximal insertion of the left hip adductor. Full ROM of the hip, no swelling          Assessment & Plan:  Chronic groin strain. We will refer to Sports Medicine

## 2014-06-18 ENCOUNTER — Ambulatory Visit: Payer: PRIVATE HEALTH INSURANCE | Admitting: Family Medicine

## 2014-06-25 ENCOUNTER — Ambulatory Visit (INDEPENDENT_AMBULATORY_CARE_PROVIDER_SITE_OTHER)
Admission: RE | Admit: 2014-06-25 | Discharge: 2014-06-25 | Disposition: A | Discharge status: Home / Self Care | Payer: PRIVATE HEALTH INSURANCE | Source: Ambulatory Visit | Attending: Family Medicine | Admitting: Family Medicine

## 2014-06-25 ENCOUNTER — Encounter: Payer: Self-pay | Admitting: Family Medicine

## 2014-06-25 ENCOUNTER — Ambulatory Visit (INDEPENDENT_AMBULATORY_CARE_PROVIDER_SITE_OTHER): Payer: PRIVATE HEALTH INSURANCE | Admitting: Family Medicine

## 2014-06-25 VITALS — BP 114/76 | HR 56 | Ht 71.5 in | Wt 145.0 lb

## 2014-06-25 DIAGNOSIS — G5702 Lesion of sciatic nerve, left lower limb: Secondary | ICD-10-CM

## 2014-06-25 DIAGNOSIS — M25552 Pain in left hip: Secondary | ICD-10-CM

## 2014-06-25 NOTE — Patient Instructions (Addendum)
Good to see you.   Ice 20 minutes 2 times daily. Usually after activity and before bed. Exercises 3 times a week.  Take tylenol 650 mg three times a day is the best evidence based medicine we have for arthritis.  Glucosamine sulfate 1500mg  twice a day is a supplement that has been shown to help moderate to severe arthritis. Vitamin D 2000 IU daily Fish oil 2 grams daily.  Tumeric 500mg  twice daily.  Capsaicin topically up to four times a day may also help with pain. Cortisone injections are an option if these interventions do not seem to make a difference or need more relief.  If cortisone injections do not help, there are different types of shots that may help but they take longer to take effect.  We can discuss this at follow up.  It's important that you continue to stay active. Consider physical therapy to strengthen muscles around the joint that hurts to take pressure off of the joint itself. Water aerobics and cycling with low resistance are the best two types of exercise for arthritis. Come back and see me in 3-4 weeks.

## 2014-06-25 NOTE — Assessment & Plan Note (Addendum)
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  Piriformis Syndrome  Using an anatomical model, reviewed with the patient the structures involved and how they related to diagnosis. The patient indicated understanding.   The patient was given a handout from Dr. Arne Cleveland book "The Sports Medicine Patient Advisor" describing the anatomy and rehabilitation of the following condition: Piriformis Syndrome  Also given a handout with more extensive Piriformis stretching, hip flexor and abductor strengthening, ham stretching  Rec deep massage, explained self-massage with ball Return to clinic in 3-4 weeks

## 2014-06-25 NOTE — Assessment & Plan Note (Signed)
Unfortunate secondary to the limited to range of motion with internal rotation as well as patient's history I am concerned the patient actually has some osteophytic changes and x-rays are currently ordered today for further evaluation. We discussed over-the-counter medications a could be beneficial in patient did work with Product/process development scientist today to learn home exercises in greater detail. We discussed an icing regimen. We discussed proper shoewear. We discussed changing certain activities throughout the weeks that could be beneficial. Patient will make these changes and come back and see me again in 3-4 weeks for further evaluation.

## 2014-06-25 NOTE — Progress Notes (Signed)
Oscar Sparks, [place] [address] Phone: [phone] Subjective:    I'm seeing this patient by the request  of:  FRY,STEPHEN A, MD   CC: Left hip pain  [id number] Oscar Sparks is a 74 y.o. male coming in with complaint of left hip pain. Patient was seen by primary care provider 3 weeks ago No significant concern for a groin strain. Patient states that unfortunately going up and down stairs seems to be the most hurtful thing. Patient states though that now even some of his other activities such as golfing or walking can give him discomfort. Seems that most of this pain seems to be in the groin area but sometimes can have pain on the posterior aspect of the hip. Patient has noticed that he has changed how he walks somewhat and since then that when the posterior and lateral aspect of the hip has occurred. Patient denies any radiation down the leg or any associated back pain. Patient also denies any fevers chills or any abnormal weight loss. Patient rates the severity of pain of 5 out of 10 but this is not stopping any of his daily activities and not waking him up at night.    Past medical history, social, surgical and family history all reviewed in electronic medical record.  Past Medical History  Diagnosis Date  . ED (erectile dysfunction)   . Prostate cancer     sees Dr. Gaynelle Arabian, received cesium seeds     Past Surgical History  Procedure Laterality Date  . Spine surgery  2004    Dr. Sherwood Gambler  . Tonsillectomy     History   Social History  . Marital Status: Married    Spouse Name: N/A  . Number of Children: N/A  . Years of Education: N/A   Occupational History  . Not on file.   Social History Main Topics  . Smoking status: Never Smoker   . Smokeless tobacco: Never Used  . Alcohol Use: 0.6 oz/week    1 Glasses of wine per week  . Drug Use: No  . Sexual Activity: Not on file   Other Topics Concern  . Not on file    Social History Narrative   No Known Allergies Oscar Sparks does not currently have medications on file.   Review of Systems: No headache, visual changes, nausea, vomiting, diarrhea, constipation, dizziness, abdominal pain, skin rash, fevers, chills, night sweats, weight loss, swollen lymph nodes, body aches, joint swelling, muscle aches, chest pain, shortness of breath, mood changes.   Objective Blood pressure 114/76, pulse 56, height 5' 11.5" (1.816 m), weight 145 lb (65.772 kg), SpO2 99 %.  General: No apparent distress alert and oriented x3 mood and affect normal, dressed appropriately.  HEENT: Pupils equal, extraocular movements intact  Respiratory: Patient's speak in full sentences and does not appear short of breath  Cardiovascular: No lower extremity edema, non tender, no erythema  Skin: Warm dry intact with no signs of infection or rash on extremities or on axial skeleton.  Abdomen: Soft nontender  Neuro: Cranial nerves II through XII are intact, neurovascularly intact in all extremities with 2+ DTRs and 2+ pulses.  Lymph: No lymphadenopathy of posterior or anterior cervical chain or axillae bilaterally.  Gait normal with good balance and coordination.  MSK:  Non tender with full range of motion and good stability and symmetric strength and tone of shoulders, elbows, wrist,  knee and ankles bilaterally.  \Hip: Left ROM IR:  36 Deg with pain, ER: 25 Deg with pain, Flexion: 100 Deg, Extension: 100 Deg, Abduction: 45 Deg, Adduction: 45 Deg Strength IR: 4/5, ER: 5/5, Flexion: 5/5, Extension: 4/5, Abduction: 4/5, Adduction: 5/5 Pelvic alignment unremarkable to inspection and palpation. Standing hip rotation and gait without trendelenburg sign / unsteadiness. Greater trochanter without tenderness to palpation. Tender over the piriformis muscle as well. Positive Faber and pain with internal rotation No SI joint tenderness and normal minimal SI movement. Contralateral hip unremarkable  minimal decrease in internal and external range of motion  Procedure note 97110; 15 minutes spent for Therapeutic exercises as stated in above notes.  This included exercises focusing on stretching, strengthening, with significant focus on eccentric aspects.   Hip strengthening exercises which included:  Pelvic tilt/bracing to help with proper recruitment of the lower abs and pelvic floor muscles  Glute strengthening to properly contract glutes without over-engaging low back and hamstrings - prone hip extension and glute bridge exercises Proper stretching techniques to increase effectiveness for the hip flexors, groin, quads, piriformis and low back when appropriate  Proper technique shown and discussed handout in great detail with ATC.  All questions were discussed and answered.       Impression and Recommendations:     This case required medical decision making of moderate complexity.

## 2014-06-25 NOTE — Progress Notes (Signed)
Pre visit review using our clinic review tool, if applicable. No additional management support is needed unless otherwise documented below in the visit note. 

## 2014-07-16 ENCOUNTER — Ambulatory Visit (INDEPENDENT_AMBULATORY_CARE_PROVIDER_SITE_OTHER): Payer: PRIVATE HEALTH INSURANCE | Admitting: Family Medicine

## 2014-07-16 ENCOUNTER — Encounter: Payer: Self-pay | Admitting: Family Medicine

## 2014-07-16 VITALS — BP 112/72 | HR 65 | Ht 71.5 in | Wt 144.0 lb

## 2014-07-16 DIAGNOSIS — G5702 Lesion of sciatic nerve, left lower limb: Secondary | ICD-10-CM | POA: Diagnosis not present

## 2014-07-16 NOTE — Progress Notes (Signed)
[facility] [address] [place], [place] [address] Phone: [phone] Subjective:     CC: Left hip pain follow up  [id number] Oscar Sparks is a 74 y.o. male coming in with complaint of left hip pain. Patient was seen previously and was diagnosed with more of a piriformis syndrome. Patient did have x-rays after last exam that did show some degenerative changes with mild narrowing of the hips bilaterally but no significant bony abnormality with patient's past medical history significant for prostate cancer. Patient was given conservative therapy such as exercises including external rotation and hip abductor strengthening. Patient was also to do manual massage as well as over-the-counter natural supplementations. Patient states he is doing any 5% better. Still noticed some mild discomfort especially after doing stairs a lot. States though that there is no significant radiation down the leg, no numbness, nothing that his stopping him from any activities. Patient is doing very well and is happy with the results.    Past medical history, social, surgical and family history all reviewed in electronic medical record.  Past Medical History  Diagnosis Date  . ED (erectile dysfunction)   . Prostate cancer     sees Dr. Gaynelle Sparks, received cesium seeds     Past Surgical History  Procedure Laterality Date  . Spine surgery  2004    Dr. Sherwood Gambler  . Tonsillectomy     History   Social History  . Marital Status: Married    Spouse Name: N/A  . Number of Children: N/A  . Years of Education: N/A   Occupational History  . Not on file.   Social History Main Topics  . Smoking status: Never Smoker   . Smokeless tobacco: Never Used  . Alcohol Use: 0.6 oz/week    1 Glasses of wine per week  . Drug Use: No  . Sexual Activity: Not on file   Other Topics Concern  . Not on file   Social History Narrative   No Known Allergies Oscar Sparks does not currently have  medications on file.   Review of Systems: No headache, visual changes, nausea, vomiting, diarrhea, constipation, dizziness, abdominal pain, skin rash, fevers, chills, night sweats, weight loss, swollen lymph nodes, body aches, joint swelling, muscle aches, chest pain, shortness of breath, mood changes.   Objective Blood pressure 112/72, pulse 65, height 5' 11.5" (1.816 m), weight 144 lb (65.318 kg), SpO2 99 %.  General: No apparent distress alert and oriented x3 mood and affect normal, dressed appropriately.  HEENT: Pupils equal, extraocular movements intact  Respiratory: Patient's speak in full sentences and does not appear short of breath  Cardiovascular: No lower extremity edema, non tender, no erythema  Skin: Warm dry intact with no signs of infection or rash on extremities or on axial skeleton.  Abdomen: Soft nontender  Neuro: Cranial nerves II through XII are intact, neurovascularly intact in all extremities with 2+ DTRs and 2+ pulses.  Lymph: No lymphadenopathy of posterior or anterior cervical chain or axillae bilaterally.  Gait normal with good balance and coordination.  MSK:  Non tender with full range of motion and good stability and symmetric strength and tone of shoulders, elbows, wrist,  knee and ankles bilaterally.  Hip: Left ROM IR: 15 Deg with pain, ER: 25 Deg with pain, Flexion: 100 Deg, Extension: 100 Deg, Abduction: 45 Deg, Adduction: 45 Deg Strength IR: 4/5, ER: 5/5, Flexion: 5/5, Extension: 4/5, Abduction: 4/5, Adduction: 5/5 Pelvic alignment unremarkable to inspection and palpation.  Standing hip rotation and gait without trendelenburg sign / unsteadiness. Greater trochanter without tenderness to palpation. Less tender than previously over the piriformis Positive Faber and pain with internal rotation or range of motion with external rotation. No SI joint tenderness and normal minimal SI movement. Contralateral hip unremarkable minimal decrease in internal and  external range of motion    Impression and Recommendations:     This case required medical decision making of moderate complexity.

## 2014-07-16 NOTE — Patient Instructions (Signed)
Good to see you You are doing great! Ice when you need it after activity and maybe before bed Try the pennsaid 2 times daily See me when you need me

## 2014-07-16 NOTE — Assessment & Plan Note (Signed)
Patient is doing much better with the conservative therapy. Encourage patient to continue the natural supplementations as well as the home exercises. His lungs patient does well patient can follow-up on an as-needed basis.

## 2014-07-16 NOTE — Progress Notes (Signed)
Pre visit review using our clinic review tool, if applicable. No additional management support is needed unless otherwise documented below in the visit note. 

## 2015-09-10 ENCOUNTER — Ambulatory Visit: Payer: PRIVATE HEALTH INSURANCE | Admitting: Family Medicine

## 2016-10-09 ENCOUNTER — Ambulatory Visit (INDEPENDENT_AMBULATORY_CARE_PROVIDER_SITE_OTHER): Payer: PRIVATE HEALTH INSURANCE | Admitting: Family Medicine

## 2016-10-09 ENCOUNTER — Encounter: Payer: Self-pay | Admitting: Family Medicine

## 2016-10-09 VITALS — BP 141/75 | HR 81 | Temp 98.8°F | Ht 71.5 in | Wt 142.0 lb

## 2016-10-09 DIAGNOSIS — R531 Weakness: Secondary | ICD-10-CM | POA: Diagnosis not present

## 2016-10-09 LAB — POC URINALSYSI DIPSTICK (AUTOMATED)
Bilirubin, UA: NEGATIVE
Blood, UA: NEGATIVE
Glucose, UA: NEGATIVE
Ketones, UA: NEGATIVE
Leukocytes, UA: NEGATIVE
Nitrite, UA: NEGATIVE
Protein, UA: NEGATIVE
Spec Grav, UA: >=1.03 — AB (ref 1.010–1.025)
Urobilinogen, UA: 0.2 E.U./dL
pH, UA: 6 (ref 5.0–8.0)

## 2016-10-09 NOTE — Progress Notes (Signed)
   Subjective:    Patient ID: Oscar Sparks, male    DOB: 02/01/1941, 76 y.o.   MRN: [id number]  HPI Here for an 11 month history of intermittent generalized weakness, cold chills, and a dry cough. No fevers or ST. No SOB. No other specific symptoms. These have been a little worse the last few days.    Review of Systems  Constitutional: Positive for chills and fatigue. Negative for diaphoresis and fever.  HENT: Negative.   Eyes: Negative.   Respiratory: Positive for cough. Negative for chest tightness, shortness of breath and wheezing.   Cardiovascular: Negative.   Gastrointestinal: Negative.   Endocrine: Negative.   Genitourinary: Negative.   Neurological: Negative.        Objective:   Physical Exam  Constitutional: He is oriented to person, place, and time. He appears well-developed and well-nourished. No distress.  HENT:  Right Ear: External ear normal.  Left Ear: External ear normal.  Nose: Nose normal.  Mouth/Throat: Oropharynx is clear and moist.  Eyes: Conjunctivae are normal. No scleral icterus.  Neck: Neck supple. No thyromegaly present.  Cardiovascular: Normal rate, regular rhythm, normal heart sounds and intact distal pulses.   Pulmonary/Chest: Effort normal and breath sounds normal. No respiratory distress. He has no wheezes. He has no rales.  Abdominal: Soft. Bowel sounds are normal. He exhibits no distension and no mass. There is no tenderness. There is no rebound and no guarding.  Lymphadenopathy:    He has no cervical adenopathy.  Neurological: He is alert and oriented to person, place, and time.          Assessment & Plan:  Generalized weakness and a dry cough. It is unclear what the etiology of this could be. We will send him for labs to rule out infection, anemia, etc and he will get a CXR as well. We will follow up per the results.  Alysia Penna, MD

## 2016-10-09 NOTE — Patient Instructions (Signed)
WE NOW OFFER   [facility] FAST TRACK!!!  SAME DAY Appointments for ACUTE CARE  Such as: Sprains, Injuries, cuts, abrasions, rashes, muscle pain, joint pain, back pain Colds, flu, sore throats, headache, allergies, cough, fever  Ear pain, sinus and eye infections Abdominal pain, nausea, vomiting, diarrhea, upset stomach Animal/insect bites  3 Easy Ways to Schedule: Walk-In Scheduling Call in scheduling Mychart Sign-up: [email or web address]         

## 2016-10-10 ENCOUNTER — Encounter (HOSPITAL_COMMUNITY): Payer: Self-pay | Admitting: Emergency Medicine

## 2016-10-10 ENCOUNTER — Emergency Department (HOSPITAL_COMMUNITY)
Admission: EM | Admit: 2016-10-10 | Discharge: 2016-10-11 | Disposition: A | Discharge status: Home / Self Care | Payer: PRIVATE HEALTH INSURANCE | Attending: Emergency Medicine | Admitting: Emergency Medicine

## 2016-10-10 ENCOUNTER — Telehealth: Payer: Self-pay | Admitting: Family Medicine

## 2016-10-10 DIAGNOSIS — Z79899 Other long term (current) drug therapy: Secondary | ICD-10-CM | POA: Diagnosis not present

## 2016-10-10 DIAGNOSIS — Z8546 Personal history of malignant neoplasm of prostate: Secondary | ICD-10-CM | POA: Diagnosis not present

## 2016-10-10 DIAGNOSIS — D509 Iron deficiency anemia, unspecified: Secondary | ICD-10-CM | POA: Diagnosis not present

## 2016-10-10 DIAGNOSIS — R0602 Shortness of breath: Secondary | ICD-10-CM | POA: Diagnosis not present

## 2016-10-10 DIAGNOSIS — D649 Anemia, unspecified: Secondary | ICD-10-CM

## 2016-10-10 DIAGNOSIS — R5383 Other fatigue: Secondary | ICD-10-CM | POA: Diagnosis present

## 2016-10-10 LAB — RETICULOCYTES
RBC.: 4.51 MIL/uL (ref 4.22–5.81)
Retic Count, Absolute: 45.1 10*3/uL (ref 19.0–186.0)
Retic Ct Pct: 1 % (ref 0.4–3.1)

## 2016-10-10 LAB — COMPREHENSIVE METABOLIC PANEL
ALT: 23 U/L (ref 17–63)
AST: 32 U/L (ref 15–41)
Albumin: 3.9 g/dL (ref 3.5–5.0)
Alkaline Phosphatase: 64 U/L (ref 38–126)
Anion gap: 10 (ref 5–15)
BUN: 19 mg/dL (ref 6–20)
CO2: 25 mmol/L (ref 22–32)
Calcium: 8.8 mg/dL — ABNORMAL LOW (ref 8.9–10.3)
Chloride: 99 mmol/L — ABNORMAL LOW (ref 101–111)
Creatinine, Ser: 0.91 mg/dL (ref 0.61–1.24)
GFR calc Af Amer: >60 mL/min (ref >60)
GFR calc non Af Amer: >60 mL/min (ref >60)
Glucose, Bld: 113 mg/dL — ABNORMAL HIGH (ref 65–99)
Potassium: 4.1 mmol/L (ref 3.5–5.1)
Sodium: 134 mmol/L — ABNORMAL LOW (ref 135–145)
Total Bilirubin: 0.6 mg/dL (ref 0.3–1.2)
Total Protein: 6.9 g/dL (ref 6.5–8.1)

## 2016-10-10 LAB — IRON AND TIBC
Iron: 7 ug/dL — ABNORMAL LOW (ref 45–182)
Saturation Ratios: 2 % — ABNORMAL LOW (ref 17.9–39.5)
TIBC: 458 ug/dL — ABNORMAL HIGH (ref 250–450)
UIBC: 451 ug/dL

## 2016-10-10 LAB — BASIC METABOLIC PANEL
BUN: 15 mg/dL (ref 6–23)
CO2: 28 mEq/L (ref 19–32)
Calcium: 9.4 mg/dL (ref 8.4–10.5)
Chloride: 99 mEq/L (ref 96–112)
Creatinine, Ser: 0.91 mg/dL (ref 0.40–1.50)
GFR: 86.55 mL/min (ref >60.00)
Glucose, Bld: 96 mg/dL (ref 70–99)
Potassium: 4.6 mEq/L (ref 3.5–5.1)
Sodium: 136 mEq/L (ref 135–145)

## 2016-10-10 LAB — CBC WITH DIFFERENTIAL/PLATELET
Basophils Absolute: 0 10*3/uL (ref 0.0–0.1)
Basophils Relative: 0.4 % (ref 0.0–3.0)
Eosinophils Absolute: 0 10*3/uL (ref 0.0–0.7)
Eosinophils Relative: 0.6 % (ref 0.0–5.0)
HCT: 25.8 % — ABNORMAL LOW (ref 39.0–52.0)
Hemoglobin: 7.2 g/dL — CL (ref 13.0–17.0)
Lymphocytes Relative: 19.5 % (ref 12.0–46.0)
Lymphs Abs: 1.6 10*3/uL (ref 0.7–4.0)
MCHC: 28 g/dL — ABNORMAL LOW (ref 30.0–36.0)
MCV: 64 fl — ABNORMAL LOW (ref 78.0–100.0)
Monocytes Absolute: 0.8 10*3/uL (ref 0.1–1.0)
Monocytes Relative: 9.7 % (ref 3.0–12.0)
Neutro Abs: 5.8 10*3/uL (ref 1.4–7.7)
Neutrophils Relative %: 69.8 % (ref 43.0–77.0)
Platelets: 355 10*3/uL (ref 150.0–400.0)
RBC: 4.03 Mil/uL — ABNORMAL LOW (ref 4.22–5.81)
RDW: 20.7 % — ABNORMAL HIGH (ref 11.5–15.5)
WBC: 8.4 10*3/uL (ref 4.0–10.5)

## 2016-10-10 LAB — CBC
HCT: 24.6 % — ABNORMAL LOW (ref 39.0–52.0)
Hemoglobin: 6.7 g/dL — CL (ref 13.0–17.0)
MCH: 17.7 pg — ABNORMAL LOW (ref 26.0–34.0)
MCHC: 27.2 g/dL — ABNORMAL LOW (ref 30.0–36.0)
MCV: 64.9 fL — ABNORMAL LOW (ref 78.0–100.0)
Platelets: 310 10*3/uL (ref 150–400)
RBC: 3.79 MIL/uL — ABNORMAL LOW (ref 4.22–5.81)
RDW: 19.3 % — ABNORMAL HIGH (ref 11.5–15.5)
WBC: 5.6 10*3/uL (ref 4.0–10.5)

## 2016-10-10 LAB — HEPATIC FUNCTION PANEL
ALT: 16 U/L (ref 0–53)
AST: 25 U/L (ref 0–37)
Albumin: 4.2 g/dL (ref 3.5–5.2)
Alkaline Phosphatase: 74 U/L (ref 39–117)
Bilirubin, Direct: 0.1 mg/dL (ref 0.0–0.3)
Total Bilirubin: 0.6 mg/dL (ref 0.2–1.2)
Total Protein: 6.7 g/dL (ref 6.0–8.3)

## 2016-10-10 LAB — POC OCCULT BLOOD, ED: Fecal Occult Bld: NEGATIVE

## 2016-10-10 LAB — TSH: TSH: 2.13 u[IU]/mL (ref 0.35–4.50)

## 2016-10-10 LAB — PREPARE RBC (CROSSMATCH)

## 2016-10-10 LAB — VITAMIN B12: Vitamin B-12: 744 pg/mL (ref 180–914)

## 2016-10-10 LAB — ABO/RH: ABO/RH(D): O POS

## 2016-10-10 LAB — FERRITIN: Ferritin: 5 ng/mL — ABNORMAL LOW (ref 24–336)

## 2016-10-10 LAB — FOLATE: Folate: 40 ng/mL (ref >5.9)

## 2016-10-10 MED ORDER — SODIUM CHLORIDE 0.9 % IV SOLN: Freq: Once | INTRAVENOUS | Status: DC

## 2016-10-10 MED ORDER — FERROUS SULFATE 325 (65 FE) MG PO TABS: 325.0000 mg | ORAL_TABLET | Freq: Two times a day (BID) | ORAL | 0 refills | Status: DC

## 2016-10-10 NOTE — ED Notes (Signed)
Unable to collect lab patient is receiving blood product

## 2016-10-10 NOTE — ED Notes (Signed)
RATE DOSE CHANGE BLOOD RATE FROM 120 to 200 VERBAL ORDER PROVIDER

## 2016-10-10 NOTE — Telephone Encounter (Signed)
I spoke with Oscar Sparks and and went over lab results, Oscar Sparks agreed to go to [facility] and Dr. Sarajane Jews is aware.

## 2016-10-10 NOTE — ED Triage Notes (Signed)
Patient been having on going weakness and saw [facility] yesterday and had blood work done. Patient was called today stating that Hgb 7.1 on needed to go to Ed for blood traNSFUSION.  Patient denies any abnormal or dark stools.

## 2016-10-10 NOTE — Telephone Encounter (Signed)
Please  call [phone] pt work number

## 2016-10-10 NOTE — Telephone Encounter (Signed)
I left a voice message for pt to return my call, very important lab results to discuss, hemoglobin 7.2 and Dr. Sarajane Jews recommends pt to to ER, most likely will need to transfuse.

## 2016-10-10 NOTE — ED Provider Notes (Signed)
[facility] DEPT Provider Note   CSN: [id number] Arrival date & time: 10/10/16  1825     History   Chief Complaint Chief Complaint  Patient presents with  . sent by PCP for Hgb 7.1    HPI Oscar Sparks is a 76 y.o. male.  Oscar Sparks is a 76 y.o. Male who presents to the emergency department complaining of worsening fatigue over the past several months as well as anemia detected by his PCP today. Patient tells me since September 2017 he's been feeling more fatigued and generally weak. He reports he becomes short of breath at times with exertion. He reports having low energy. He reports seeing his PCP yesterday who drew blood work. He was noted to have a hemoglobin of 7.1. This is a significant decrease from his baseline of around 13. He denies feeling short of breath at rest. Patient denies any easy bruising or bleeding. He denies anticoagulation use. He denies hematemesis, hematochezia, gum bleeding or nosebleeds. He denies fevers, abdominal pain, chest pain, rashes, urinary symptoms, nausea, vomiting, diarrhea, hematemesis, hematochezia, anticoagulation use, or history of anemia.   The history is provided by the patient, medical records and the spouse. No language interpreter was used.    Past Medical History:  Diagnosis Date  . ED (erectile dysfunction)   . Prostate cancer [facility])    sees Dr. Gaynelle Arabian, received cesium seeds     Patient Active Problem List   Diagnosis Date Noted  . Left hip pain 06/25/2014  . 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 Prostate cancer ([facility]) 11/15/2009  . 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 CONTACT DERMATITIS 09/04/2007    Past Surgical History:  Procedure Laterality Date  . BACK SURGERY    . SPINE SURGERY  2004   Dr. Sherwood Gambler  . TONSILLECTOMY         Home Medications    Prior to Admission medications   Medication Sig Start Date End Date Taking?  Authorizing Provider  Ascorbic Acid (VITAMIN C PO) Take by mouth daily.   Yes [provider]  B Complex-C (SUPER B COMPLEX PO) Take by mouth.   Yes [provider]  Cholecalciferol (VITAMIN D PO) Take by mouth daily.   Yes [provider]  Multiple Vitamins-Minerals (MULTIVITAMIN WITH MINERALS) tablet Take 1 tablet by mouth daily.   Yes [provider]  Multiple Vitamins-Minerals (ZINC PO) Take by mouth daily.   Yes [provider]  Omega-3 Fatty Acids (FISH OIL PO) Take by mouth daily.   Yes [provider]  selenium 50 MCG TABS Take 50 mcg by mouth daily.   Yes [provider]  TURMERIC PO Take 1 tablet by mouth daily.   Yes [provider]  vitamin E 400 UNIT capsule Take 400 Units by mouth daily.   Yes [provider]  ferrous sulfate 325 (65 FE) MG tablet Take 1 tablet (325 mg total) by mouth 2 (two) times daily with a meal. 10/10/16   Waynetta Pean, PA-C  tadalafil (CIALIS) 20 MG tablet Take 1 tablet (20 mg total) by mouth daily as needed for erectile dysfunction. Patient not taking: Reported on 10/10/2016 02/07/12   Laurey Morale, MD    Family History Family History  Problem Relation Age of Onset  . Breast cancer Unknown     Social History Social History  Substance Use Topics  . Smoking status: Never Smoker  . Smokeless tobacco:  Never Used  . Alcohol use 0.6 oz/week    1 Glasses of wine per week     Allergies   Poison ivy extract   Review of Systems Review of Systems  Constitutional: Positive for fatigue. Negative for chills and fever.  HENT: Negative for congestion, nosebleeds and sore throat.   Eyes: Negative for visual disturbance.  Respiratory: Positive for shortness of breath (with exertion ). Negative for cough and wheezing.   Cardiovascular: Negative for chest pain.  Gastrointestinal: Negative for abdominal pain, blood in stool, diarrhea, nausea and vomiting.  Genitourinary:  Negative for dysuria and hematuria.  Musculoskeletal: Negative for back pain and neck pain.  Skin: Negative for rash.  Neurological: Negative for dizziness, syncope, weakness, light-headedness, numbness and headaches.     Physical Exam Updated Vital Signs BP (!) 112/52   Pulse 76   Temp 98.2 F (36.8 C) (Oral)   Resp 18   SpO2 98%   Physical Exam  Constitutional: He is oriented to person, place, and time. He appears well-developed and well-nourished. No distress.  Nontoxic appearing.  HENT:  Head: Normocephalic and atraumatic.  Mouth/Throat: Oropharynx is clear and moist.  Eyes: Pupils are equal, round, and reactive to light. Conjunctivae are normal. Right eye exhibits no discharge. Left eye exhibits no discharge.  Neck: Neck supple.  Cardiovascular: Normal rate, regular rhythm, normal heart sounds and intact distal pulses.  Exam reveals no gallop and no friction rub.   No murmur heard. Pulmonary/Chest: Effort normal and breath sounds normal. No respiratory distress. He has no wheezes. He has no rales.  Abdominal: Soft. There is no tenderness. There is no guarding.  Genitourinary: Rectum normal.  Genitourinary Comments: Digital rectal exam by myself with male PA student as chaperone. No gross bloody stool. Normal anal sphincter tone.  Musculoskeletal: He exhibits no edema.  Lymphadenopathy:    He has no cervical adenopathy.  Neurological: He is alert and oriented to person, place, and time. Coordination normal.  Skin: Skin is warm and dry. Capillary refill takes less than 2 seconds. No rash noted. He is not diaphoretic. No erythema. There is pallor.  Slightly pale.  Psychiatric: He has a normal mood and affect. His behavior is normal.  Nursing note and vitals reviewed.    ED Treatments / Results  Labs (all labs ordered are listed, but only abnormal results are displayed) Labs Reviewed  COMPREHENSIVE METABOLIC PANEL - Abnormal; Notable for the following:       Result  Value   Sodium 134 (*)    Chloride 99 (*)    Glucose, Bld 113 (*)    Calcium 8.8 (*)    All other components within normal limits  CBC - Abnormal; Notable for the following:    RBC 3.79 (*)    Hemoglobin 6.7 (*)    HCT 24.6 (*)    MCV 64.9 (*)    MCH 17.7 (*)    MCHC 27.2 (*)    RDW 19.3 (*)    All other components within normal limits  IRON AND TIBC - Abnormal; Notable for the following:    Iron 7 (*)    TIBC 458 (*)    Saturation Ratios 2 (*)    All other components within normal limits  FERRITIN - Abnormal; Notable for the following:    Ferritin 5 (*)    All other components within normal limits  VITAMIN B12  RETICULOCYTES  FOLATE  HEMOGLOBIN AND HEMATOCRIT, BLOOD  POC OCCULT BLOOD, ED  TYPE AND  SCREEN  PREPARE RBC (CROSSMATCH)  ABO/RH    EKG  EKG Interpretation None       Radiology No results found.  Procedures Procedures (including critical care time)    Medications Ordered in ED Medications  0.9 %  sodium chloride infusion (not administered)     Initial Impression / Assessment and Plan / ED Course  I have reviewed the triage vital signs and the nursing notes.  Pertinent labs & imaging results that were available during my care of the patient were reviewed by me and considered in my medical decision making (see chart for details).    This  is a 76 y.o. Male who presents to the emergency department complaining of worsening fatigue over the past several months as well as anemia detected by his PCP today. Patient tells me since September 2017 he's been feeling more fatigued and generally weak. He reports he becomes short of breath at times with exertion. He reports having low energy. He reports seeing his PCP yesterday who drew blood work. He was noted to have a hemoglobin of 7.1. This is a significant decrease from his baseline of around 13. He denies feeling short of breath at rest. Patient denies any easy bruising or bleeding. He denies anticoagulation  use. He denies hematemesis, hematochezia, gum bleeding or nosebleeds. On exam patient is afebrile nontoxic appearing. He does appear slightly pale. Exam is otherwise unremarkable. Blood work reveals a hemoglobin of 6.7. This is decreased from yesterday that was 7.2. Guaiac result is negative. Iron level is low as is TIBC. I discussed the risks and benefits of a blood transfusion. Patient would like blood transfusion today. I also recommended admission for this patient's unexplained anemia that has continued to drop since yesterday. Patient is strongly against admission. I did discuss the risks and benefits of an admission today. He declines admission. He will follow-up with primary care. Will start the patient on oral iron supplementation. Discussed high iron diet. At reevaluation patient is still receiving his blood transfusion. Pain is for repeat H&H following blood transfusion. If this is improved patient can be discharged as he requests. At shift change patient is still awaiting completion of his blood transfusion. Patient care signed out to Anderson Malta, PA-C at shift change who will disposition the patient after H&H results.   This patient was discussed with and evaluated by Dr. Ellender Hose who agrees with assessment and plan.   Final Clinical Impressions(s) / ED Diagnoses   Final diagnoses:  Symptomatic anemia  Iron deficiency anemia, unspecified iron deficiency anemia type    New Prescriptions New Prescriptions   FERROUS SULFATE 325 (65 FE) MG TABLET    Take 1 tablet (325 mg total) by mouth 2 (two) times daily with a meal.     Waynetta Pean, PA-C 10/10/16 2350    Duffy Bruce, MD 10/11/16 1144

## 2016-10-11 DIAGNOSIS — D509 Iron deficiency anemia, unspecified: Secondary | ICD-10-CM | POA: Diagnosis not present

## 2016-10-11 LAB — BPAM RBC
Blood Product Expiration Date: 201808102359
ISSUE DATE / TIME: 201807172046
Unit Type and Rh: 5100

## 2016-10-11 LAB — TYPE AND SCREEN
ABO/RH(D): O POS
Antibody Screen: NEGATIVE
Unit division: 0

## 2016-10-11 LAB — HEMOGLOBIN AND HEMATOCRIT, BLOOD
HCT: 27.4 % — ABNORMAL LOW (ref 39.0–52.0)
Hemoglobin: 7.8 g/dL — ABNORMAL LOW (ref 13.0–17.0)

## 2016-10-12 ENCOUNTER — Ambulatory Visit (INDEPENDENT_AMBULATORY_CARE_PROVIDER_SITE_OTHER)
Admission: RE | Admit: 2016-10-12 | Discharge: 2016-10-12 | Disposition: A | Discharge status: Home / Self Care | Payer: PRIVATE HEALTH INSURANCE | Source: Ambulatory Visit | Attending: Family Medicine | Admitting: Family Medicine

## 2016-10-12 DIAGNOSIS — R05 Cough: Secondary | ICD-10-CM | POA: Diagnosis not present

## 2016-10-12 DIAGNOSIS — R531 Weakness: Secondary | ICD-10-CM

## 2016-10-16 ENCOUNTER — Encounter: Payer: Self-pay | Admitting: Family Medicine

## 2016-10-16 ENCOUNTER — Ambulatory Visit (INDEPENDENT_AMBULATORY_CARE_PROVIDER_SITE_OTHER): Payer: PRIVATE HEALTH INSURANCE | Admitting: Family Medicine

## 2016-10-16 VITALS — BP 122/71 | HR 63 | Temp 98.1°F | Ht 71.5 in | Wt 141.0 lb

## 2016-10-16 DIAGNOSIS — D509 Iron deficiency anemia, unspecified: Secondary | ICD-10-CM | POA: Diagnosis not present

## 2016-10-16 NOTE — Patient Instructions (Signed)
WE NOW OFFER   [facility] FAST TRACK!!!  SAME DAY Appointments for ACUTE CARE  Such as: Sprains, Injuries, cuts, abrasions, rashes, muscle pain, joint pain, back pain Colds, flu, sore throats, headache, allergies, cough, fever  Ear pain, sinus and eye infections Abdominal pain, nausea, vomiting, diarrhea, upset stomach Animal/insect bites  3 Easy Ways to Schedule: Walk-In Scheduling Call in scheduling Mychart Sign-up: [email or web address]         

## 2016-10-16 NOTE — Progress Notes (Signed)
   Subjective:    Patient ID: Oscar Sparks, male    DOB: May 22, 1940, 76 y.o.   MRN: [id number]  HPI Here to follow up on an ER visit on 10-10-16 for severe anemia. He saw [place] the day before for several month sof progressive weakness and fatigue, and labs that day revealed his Hgb to be quite low. We sent him to the ER the next day, and that day the Hgb was 6.7. His iron was low at 7 and the ferritin was 5. Folate and B12 were normal. Stool hemocult was negative. He was transfused 2 units of PRBC. No follow up Hgb was obtained. Since then he feels much better with normal stamina and strength. He has been taking ferrous sulfate bid and is tolerating it well. His last colonoscopy was 15 years ago.   Review of Systems  Constitutional: Negative.   Respiratory: Negative.   Cardiovascular: Negative.   Gastrointestinal: Negative.   Neurological: Negative.        Objective:   Physical Exam  Constitutional: He is oriented to person, place, and time. He appears well-developed and well-nourished.  Cardiovascular: Normal rate, regular rhythm and intact distal pulses.   Pulmonary/Chest: Effort normal and breath sounds normal. No respiratory distress. He has no wheezes. He has no rales.  Neurological: He is alert and oriented to person, place, and time.          Assessment & Plan:  Iron deficiency anemia. We will check another CBC today. He will stay on oral iron. Refer to GI to consider panendoscopy to look for GI sources of blood loss.  Alysia Penna, MD

## 2016-10-17 LAB — CBC WITH DIFFERENTIAL/PLATELET
Basophils Absolute: 0 10*3/uL (ref 0.0–0.1)
Basophils Relative: 0.1 % (ref 0.0–3.0)
Eosinophils Absolute: 0.3 10*3/uL (ref 0.0–0.7)
Eosinophils Relative: 3.4 % (ref 0.0–5.0)
HCT: 28.1 % — ABNORMAL LOW (ref 39.0–52.0)
Hemoglobin: 8.1 g/dL — ABNORMAL LOW (ref 13.0–17.0)
Lymphocytes Relative: 27.9 % (ref 12.0–46.0)
Lymphs Abs: 2.4 10*3/uL (ref 0.7–4.0)
MCHC: 28.7 g/dL — ABNORMAL LOW (ref 30.0–36.0)
MCV: 71.2 fl — ABNORMAL LOW (ref 78.0–100.0)
Monocytes Absolute: 0.9 10*3/uL (ref 0.1–1.0)
Monocytes Relative: 10 % (ref 3.0–12.0)
Neutro Abs: 5.1 10*3/uL (ref 1.4–7.7)
Neutrophils Relative %: 58.6 % (ref 43.0–77.0)
Platelets: 401 10*3/uL — ABNORMAL HIGH (ref 150.0–400.0)
RBC: 3.94 Mil/uL — ABNORMAL LOW (ref 4.22–5.81)
RDW: 25.4 % — ABNORMAL HIGH (ref 11.5–15.5)
WBC: 8.8 10*3/uL (ref 4.0–10.5)

## 2016-12-14 ENCOUNTER — Encounter: Payer: Self-pay | Admitting: Family Medicine

## 2016-12-18 ENCOUNTER — Encounter: Payer: Self-pay | Admitting: Family Medicine

## 2017-03-09 ENCOUNTER — Ambulatory Visit: Payer: PRIVATE HEALTH INSURANCE | Admitting: Family Medicine

## 2017-03-12 ENCOUNTER — Encounter: Payer: Self-pay | Admitting: Family Medicine

## 2017-03-12 ENCOUNTER — Ambulatory Visit (INDEPENDENT_AMBULATORY_CARE_PROVIDER_SITE_OTHER): Payer: PRIVATE HEALTH INSURANCE | Admitting: Family Medicine

## 2017-03-12 VITALS — BP 118/70 | HR 82 | Temp 98.4°F | Wt 141.6 lb

## 2017-03-12 DIAGNOSIS — D509 Iron deficiency anemia, unspecified: Secondary | ICD-10-CM

## 2017-03-12 NOTE — Progress Notes (Signed)
   Subjective:    Patient ID: Oscar Sparks, male    DOB: 11/25/1940, 76 y.o.   MRN: [id number]  HPI Here to follow up on iron deficiency anemia. He feels well with normal energy. He is taking 4 ferrous sulfate tablets a day. His last Hgb in July was 8.1.    Review of Systems  Constitutional: Negative.   Respiratory: Negative.   Cardiovascular: Negative.   Neurological: Negative.        Objective:   Physical Exam  Constitutional: He is oriented to person, place, and time. He appears well-developed and well-nourished.  Cardiovascular: Normal rate, regular rhythm, normal heart sounds and intact distal pulses.  Pulmonary/Chest: Effort normal and breath sounds normal. No respiratory distress. He has no wheezes. He has no rales.  Neurological: He is alert and oriented to person, place, and time.          Assessment & Plan:  Iron deficiency anemia. We will check a CBC and a ferritin today.  Alysia Penna, MD

## 2017-03-13 LAB — FERRITIN: Ferritin: 12.1 ng/mL — ABNORMAL LOW (ref 22.0–322.0)

## 2017-10-03 DIAGNOSIS — C44529 Squamous cell carcinoma of skin of other part of trunk: Secondary | ICD-10-CM | POA: Diagnosis not present

## 2017-10-03 DIAGNOSIS — C4441 Basal cell carcinoma of skin of scalp and neck: Secondary | ICD-10-CM | POA: Diagnosis not present

## 2017-10-03 DIAGNOSIS — C4442 Squamous cell carcinoma of skin of scalp and neck: Secondary | ICD-10-CM | POA: Diagnosis not present

## 2017-10-03 DIAGNOSIS — D225 Melanocytic nevi of trunk: Secondary | ICD-10-CM | POA: Diagnosis not present

## 2017-10-03 DIAGNOSIS — Z1283 Encounter for screening for malignant neoplasm of skin: Secondary | ICD-10-CM | POA: Diagnosis not present

## 2017-10-03 DIAGNOSIS — C44519 Basal cell carcinoma of skin of other part of trunk: Secondary | ICD-10-CM | POA: Diagnosis not present

## 2017-10-31 DIAGNOSIS — Z08 Encounter for follow-up examination after completed treatment for malignant neoplasm: Secondary | ICD-10-CM | POA: Diagnosis not present

## 2017-10-31 DIAGNOSIS — Z85828 Personal history of other malignant neoplasm of skin: Secondary | ICD-10-CM | POA: Diagnosis not present

## 2018-03-18 DIAGNOSIS — T691XXA Chilblains, initial encounter: Secondary | ICD-10-CM | POA: Diagnosis not present

## 2018-07-03 ENCOUNTER — Ambulatory Visit (INDEPENDENT_AMBULATORY_CARE_PROVIDER_SITE_OTHER): Payer: PRIVATE HEALTH INSURANCE | Admitting: Family Medicine

## 2018-07-03 ENCOUNTER — Encounter: Payer: Self-pay | Admitting: Family Medicine

## 2018-07-03 ENCOUNTER — Other Ambulatory Visit: Payer: Self-pay

## 2018-07-03 DIAGNOSIS — R1033 Periumbilical pain: Secondary | ICD-10-CM

## 2018-07-03 NOTE — Progress Notes (Deleted)
   Subjective:    Patient ID: Oscar Sparks, male    DOB: January 09, 1941, 78 y.o.   MRN: [id number]  HPI    Review of Systems     Objective:   Physical Exam        Assessment & Plan:

## 2018-07-03 NOTE — Progress Notes (Signed)
   Subjective:    Patient ID: Oscar Sparks, male    DOB: 1940/11/07, 78 y.o.   MRN: [id number]  HPI Virtual Visit via Telephone Note  I connected with the patient on 07/03/18 at  2:15 PM EDT by [email or web address]  and verified that I am speaking with the correct person using two identifiers. We had technical difficulty with both video and audio, so we spoke by telephone.   I discussed the limitations, risks, security and privacy concerns of performing an evaluation and management service by telephone and the availability of in person appointments. I also discussed with the patient that there may be a patient responsible charge related to this service. The patient expressed understanding and agreed to proceed.  Location patient: home Location provider: work or home office Participants present for the call: patient, provider Patient did not have a visit in the prior 7 days to address this/these issue(s).   History of Present Illness: He had the onset 2 weeks ago of an intermittent sharp pain in the abdomen around the navel, and sometimes he can feel a small knot under the skin. This knot is very tender to the touch. No fever or nausea. No urinary or bowel symptoms.    Observations/Objective: Patient sounds cheerful and well on the phone. I do not appreciate any SOB. Speech and thought processing are grossly intact. Patient reported vitals:  Assessment and Plan: He likely has an umbilical hernia. We will plan to see him in person next week on 07-08-18, and at that time we will make decisions about how to care for this given the current virus pandemic. He will avoid any heavy lifting.  Alysia Penna, MD   Follow Up Instructions:     [phone] 5-10 [phone] 11-20 [id number] 21-30 I did not refer this patient for an OV in the next 24 hours for this/these issue(s).  I discussed the assessment and treatment plan with the patient. The patient was provided an opportunity to ask questions and all were answered.  The patient agreed with the plan and demonstrated an understanding of the instructions.   The patient was advised to call back or seek an in-person evaluation if the symptoms worsen or if the condition fails to improve as anticipated.  I provided  18 minutes of non-face-to-face time during this encounter.   Alysia Penna, MD    Review of Systems     Objective:   Physical Exam        Assessment & Plan:

## 2018-07-08 ENCOUNTER — Ambulatory Visit (INDEPENDENT_AMBULATORY_CARE_PROVIDER_SITE_OTHER): Payer: PRIVATE HEALTH INSURANCE | Admitting: Family Medicine

## 2018-07-08 ENCOUNTER — Other Ambulatory Visit: Payer: Self-pay

## 2018-07-08 ENCOUNTER — Encounter: Payer: Self-pay | Admitting: Family Medicine

## 2018-07-08 VITALS — BP 106/68 | HR 80 | Temp 98.2°F | Wt 146.0 lb

## 2018-07-08 DIAGNOSIS — K429 Umbilical hernia without obstruction or gangrene: Secondary | ICD-10-CM

## 2018-07-08 NOTE — Progress Notes (Signed)
   Subjective:    Patient ID: Oscar Sparks, male    DOB: Aug 01, 1940, 78 y.o.   MRN: [id number]  HPI Here to check on a possible abdominal hernia. We spoke virtually last week about a sharp intermittent pain around the navel that started about 10 days ago. He was able to feel a tender lump under the skin a few times, but now the lump is gone. His BMs are normal. He has refrained from heavy lifting.    Review of Systems  Constitutional: Negative.   Respiratory: Negative.   Cardiovascular: Negative.   Gastrointestinal: Positive for abdominal pain. Negative for abdominal distention, blood in stool, constipation, diarrhea, nausea and vomiting.  Genitourinary: Negative.        Objective:   Physical Exam Constitutional:      General: He is not in acute distress.    Appearance: Normal appearance.  Cardiovascular:     Rate and Rhythm: Normal rate and regular rhythm.     Pulses: Normal pulses.     Heart sounds: Normal heart sounds.  Pulmonary:     Effort: Pulmonary effort is normal.     Breath sounds: Normal breath sounds.  Abdominal:     General: Abdomen is flat. Bowel sounds are normal. There is no distension.     Palpations: Abdomen is soft.     Tenderness: There is no guarding or rebound.     Comments: He is mildly tender over the abdomen just inferior to the umbilicus, and there is a small reducible firm lump at the inferior edge of the umbilicus. Also the abdominal aorta is very easy to palpate and he has strong pulsations. No tenderness and no bruits.   Neurological:     Mental Status: He is alert.           Assessment & Plan:  He has a small umbilical hernia, and we agreed to simply monitor this for now. He will avoid heavy  lifting. Follow up if the lump gets bigger or if the pain increases. He may have an aortic aneurysm as well, although he is quite thin and his aorta may simply be easier to palpate. We will consider getting an [place] or CT later this year to evaluate this  better once the [place] pandemic allows [place] to.  Alysia Penna, MD

## 2018-09-17 ENCOUNTER — Emergency Department (HOSPITAL_COMMUNITY)
Admission: EM | Admit: 2018-09-17 | Discharge: 2018-09-18 | Disposition: A | Discharge status: Home / Self Care | Payer: PRIVATE HEALTH INSURANCE | Attending: Emergency Medicine | Admitting: Emergency Medicine

## 2018-09-17 ENCOUNTER — Emergency Department (HOSPITAL_COMMUNITY): Payer: PRIVATE HEALTH INSURANCE

## 2018-09-17 ENCOUNTER — Other Ambulatory Visit: Payer: Self-pay

## 2018-09-17 ENCOUNTER — Ambulatory Visit (INDEPENDENT_AMBULATORY_CARE_PROVIDER_SITE_OTHER): Payer: PRIVATE HEALTH INSURANCE | Admitting: Family Medicine

## 2018-09-17 ENCOUNTER — Encounter (HOSPITAL_COMMUNITY): Payer: Self-pay | Admitting: Pharmacy Technician

## 2018-09-17 ENCOUNTER — Encounter: Payer: Self-pay | Admitting: Family Medicine

## 2018-09-17 ENCOUNTER — Telehealth: Payer: Self-pay | Admitting: Family Medicine

## 2018-09-17 VITALS — BP 106/70 | HR 93 | Temp 98.2°F | Wt 138.0 lb

## 2018-09-17 DIAGNOSIS — R1031 Right lower quadrant pain: Secondary | ICD-10-CM | POA: Diagnosis not present

## 2018-09-17 DIAGNOSIS — R1903 Right lower quadrant abdominal swelling, mass and lump: Secondary | ICD-10-CM

## 2018-09-17 DIAGNOSIS — K6389 Other specified diseases of intestine: Secondary | ICD-10-CM | POA: Diagnosis not present

## 2018-09-17 DIAGNOSIS — Z79899 Other long term (current) drug therapy: Secondary | ICD-10-CM | POA: Insufficient documentation

## 2018-09-17 DIAGNOSIS — Z8546 Personal history of malignant neoplasm of prostate: Secondary | ICD-10-CM | POA: Insufficient documentation

## 2018-09-17 LAB — CBC WITH DIFFERENTIAL/PLATELET
Abs Immature Granulocytes: 0.05 10*3/uL (ref 0.00–0.07)
Basophils Absolute: 0.1 10*3/uL (ref 0.0–0.1)
Basophils Relative: 1 %
Eosinophils Absolute: 0.3 10*3/uL (ref 0.0–0.5)
Eosinophils Relative: 3 %
HCT: 35.6 % — ABNORMAL LOW (ref 39.0–52.0)
Hemoglobin: 10.6 g/dL — ABNORMAL LOW (ref 13.0–17.0)
Immature Granulocytes: 0 %
Lymphocytes Relative: 12 %
Lymphs Abs: 1.4 10*3/uL (ref 0.7–4.0)
MCH: 27.4 pg (ref 26.0–34.0)
MCHC: 29.8 g/dL — ABNORMAL LOW (ref 30.0–36.0)
MCV: 92 fL (ref 80.0–100.0)
Monocytes Absolute: 0.9 10*3/uL (ref 0.1–1.0)
Monocytes Relative: 8 %
Neutro Abs: 8.8 10*3/uL — ABNORMAL HIGH (ref 1.7–7.7)
Neutrophils Relative %: 76 %
Platelets: 435 10*3/uL — ABNORMAL HIGH (ref 150–400)
RBC: 3.87 MIL/uL — ABNORMAL LOW (ref 4.22–5.81)
RDW: 16.1 % — ABNORMAL HIGH (ref 11.5–15.5)
WBC: 11.6 10*3/uL — ABNORMAL HIGH (ref 4.0–10.5)
nRBC: 0 % (ref 0.0–0.2)

## 2018-09-17 LAB — COMPREHENSIVE METABOLIC PANEL
ALT: 15 U/L (ref 0–44)
AST: 22 U/L (ref 15–41)
Albumin: 3.4 g/dL — ABNORMAL LOW (ref 3.5–5.0)
Alkaline Phosphatase: 62 U/L (ref 38–126)
Anion gap: 9 (ref 5–15)
BUN: 13 mg/dL (ref 8–23)
CO2: 29 mmol/L (ref 22–32)
Calcium: 9 mg/dL (ref 8.9–10.3)
Chloride: 100 mmol/L (ref 98–111)
Creatinine, Ser: 0.98 mg/dL (ref 0.61–1.24)
GFR calc Af Amer: >60 mL/min (ref >60)
GFR calc non Af Amer: >60 mL/min (ref >60)
Glucose, Bld: 104 mg/dL — ABNORMAL HIGH (ref 70–99)
Potassium: 3.6 mmol/L (ref 3.5–5.1)
Sodium: 138 mmol/L (ref 135–145)
Total Bilirubin: 0.5 mg/dL (ref 0.3–1.2)
Total Protein: 6.4 g/dL — ABNORMAL LOW (ref 6.5–8.1)

## 2018-09-17 MED ORDER — IOHEXOL 300 MG/ML  SOLN
100.0000 mL | Freq: Once | INTRAMUSCULAR | Status: AC | PRN
  Administered 2018-09-17: 100 mL via INTRAVENOUS

## 2018-09-17 NOTE — ED Triage Notes (Signed)
abd pain started saturday -- last BM was yesterday, normal, - pain with palpation. States has had some pain intermittently at night.

## 2018-09-17 NOTE — ED Notes (Signed)
Patient verbalizes understanding of discharge instructions. Opportunity for questioning and answers were provided. Armband removed by staff, pt discharged from ED.  

## 2018-09-17 NOTE — Discharge Instructions (Addendum)
You need to follow-up with your gastroenterologist as soon as you can.

## 2018-09-17 NOTE — Telephone Encounter (Signed)
Pt was seen in ED and was told he need a colonoscopy. Pt want was told he has an obstruction in his colon. Please advise pt what he need to do to sched this.

## 2018-09-17 NOTE — Progress Notes (Signed)
   Subjective:    Patient ID: Oscar Sparks, male    DOB: May 16, 1940, 78 y.o.   MRN: [id number]  HPI Here for a painful mass in the right lower abdomen. This first appeared suddenly 4 days ago. The first 24 hours it was very painful, but now the pain has eased up quite a bit. No fever. No nausea or vomiting. He has been having normal BMs up to yesterday morning. No stools since then. He has no appetite. No urinary symptoms.    Review of Systems  Constitutional: Positive for appetite change. Negative for chills, diaphoresis and fever.  Respiratory: Negative.   Cardiovascular: Negative.   Gastrointestinal: Positive for abdominal distention and abdominal pain. Negative for anal bleeding, blood in stool, constipation, diarrhea, nausea, rectal pain and vomiting.  Genitourinary: Negative.        Objective:   Physical Exam Constitutional:      General: He is not in acute distress.    Appearance: Normal appearance. He is well-developed.  Cardiovascular:     Rate and Rhythm: Normal rate and regular rhythm.     Pulses: Normal pulses.     Heart sounds: Normal heart sounds.  Pulmonary:     Effort: Pulmonary effort is normal.     Breath sounds: Normal breath sounds.  Abdominal:     General: Bowel sounds are normal.     Tenderness: There is no guarding or rebound.     Comments: There is a large firm tender non-pulsatile mass in the right middle abdomen. The left abdomen is unremarkable. No inguinal masses.   Neurological:     Mental Status: He is alert.           Assessment & Plan:  I suspect this is a small bowel obstruction, less likely an appendiceal abscess. We will have his wife drive him directly to [facility] ER for further evaluation.  Alysia Penna, MD

## 2018-09-18 NOTE — Telephone Encounter (Signed)
ED f/u appointment made for 09/19/2018 at 11:15AM per patient request.

## 2018-09-18 NOTE — Telephone Encounter (Signed)
Set up a Doxy OV to discuss the ER visit asap

## 2018-09-18 NOTE — Telephone Encounter (Signed)
Patient wants to be referred to [facility] on [address] to see Dr. Herbie [place] Buccini for colon cancer. [phone].

## 2018-09-18 NOTE — Telephone Encounter (Signed)
Dr. Sarajane Jews can we go and place this referral for patient?

## 2018-09-18 NOTE — Telephone Encounter (Signed)
Dr. Fry please advise. Thanks  

## 2018-09-19 ENCOUNTER — Ambulatory Visit: Payer: Self-pay | Admitting: Family Medicine

## 2018-09-19 DIAGNOSIS — R195 Other fecal abnormalities: Secondary | ICD-10-CM | POA: Diagnosis not present

## 2018-09-19 DIAGNOSIS — R1031 Right lower quadrant pain: Secondary | ICD-10-CM | POA: Diagnosis not present

## 2018-09-19 DIAGNOSIS — R935 Abnormal findings on diagnostic imaging of other abdominal regions, including retroperitoneum: Secondary | ICD-10-CM | POA: Diagnosis not present

## 2018-09-19 DIAGNOSIS — Z862 Personal history of diseases of the blood and blood-forming organs and certain disorders involving the immune mechanism: Secondary | ICD-10-CM | POA: Diagnosis not present

## 2018-09-19 NOTE — Addendum Note (Signed)
Addended by: Alysia Penna A on: 09/19/2018 12:20 PM   Modules accepted: Orders

## 2018-09-19 NOTE — Telephone Encounter (Signed)
I reviewed his ER note from yesterday where he had a CT scan revealing a cecal mass which is most likely a colon cancer. I will do an urgent referral to Surgery to evaluate this further

## 2018-09-22 NOTE — ED Provider Notes (Signed)
[facility] EMERGENCY DEPARTMENT Provider Note   CSN: [id number] Arrival date & time: 09/17/18  1013     History   Chief Complaint Chief Complaint  Patient presents with  . Abdominal Pain    HPI Oscar Sparks is a 78 y.o. male.     HPI   77yM with abdominal pain. RLQ. ONset several days ago. Seen by PCP and referred to ED for imaging. Abdominal mass felt on exam. Mild nausea. No vomiting. Last BM yesterday.   Past Medical History:  Diagnosis Date  . ED (erectile dysfunction)   . Prostate cancer [facility]    sees Dr. Gaynelle Arabian, received cesium seeds     Patient Active Problem List   Diagnosis Date Noted  . 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 Left hip pain 06/25/2014  . 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 Prostate cancer ([place]) 11/15/2009  . 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 CONTACT DERMATITIS 09/04/2007    Past Surgical History:  Procedure Laterality Date  . BACK SURGERY    . SPINE SURGERY  2004   Dr. Sherwood Gambler  . TONSILLECTOMY          Home Medications    Prior to Admission medications   Medication Sig Start Date End Date Taking? Authorizing Provider  Ascorbic Acid (VITAMIN C PO) Take by mouth daily.    [provider]  B Complex-C (SUPER B COMPLEX PO) Take by mouth.    [provider]  Cholecalciferol (VITAMIN D PO) Take by mouth daily.    [provider]  ferrous sulfate 325 (65 FE) MG tablet Take 1 tablet (325 mg total) by mouth 2 (two) times daily with a meal. 10/10/16   Waynetta Pean, PA-C  MAGNESIUM OXIDE PO Take by mouth daily.    [provider]  Multiple Vitamins-Minerals (MULTIVITAMIN WITH MINERALS) tablet Take 1 tablet by mouth daily.    [provider]  Multiple Vitamins-Minerals (ZINC PO) Take by mouth daily.    [provider]  Omega-3 Fatty Acids (FISH OIL PO) Take by mouth daily.     [provider]  selenium 50 MCG TABS Take 50 mcg by mouth daily.    [provider]  TURMERIC PO Take 1 tablet by mouth daily.    [provider]  vitamin E 400 UNIT capsule Take 400 Units by mouth daily.    [provider]    Family History Family History  Problem Relation Age of Onset  . Breast cancer Other     Social History Social History   Tobacco Use  . Smoking status: Never Smoker  . Smokeless tobacco: Never Used  Substance Use Topics  . Alcohol use: Yes    Alcohol/week: 1.0 standard drinks    Types: 1 Glasses of wine per week  . Drug use: No     Allergies   Poison ivy extract   Review of Systems Review of Systems All systems reviewed and negative, other than as noted in HPI.   Physical Exam Updated Vital Signs BP 120/72 (BP Location: Left Arm)   Pulse 81   Temp 98.6 F (37 C) (Oral)   Resp 18   SpO2 97%   Physical Exam Vitals signs and nursing note reviewed.  Constitutional:      General: He is not in acute distress.    Appearance: He is well-developed.  HENT:     Head: Normocephalic and atraumatic.  Eyes:     General:        Right eye: No discharge.        Left eye: No discharge.     Conjunctiva/sclera: Conjunctivae normal.  Neck:     Musculoskeletal: Neck supple.  Cardiovascular:     Rate and Rhythm: Normal rate and regular rhythm.     Heart sounds: Normal heart sounds. No murmur. No friction rub. No gallop.   Pulmonary:     Effort: Pulmonary effort is normal. No respiratory distress.     Breath sounds: Normal breath sounds.  Abdominal:     General: There is no distension.     Palpations: Abdomen is soft. There is mass.     Tenderness: There is abdominal tenderness.     Comments: Tender mass in RLQ.   Musculoskeletal:        General: No tenderness.  Skin:    General: Skin is warm and dry.  Neurological:     Mental Status: He is alert.  Psychiatric:        Behavior: Behavior normal.         Thought Content: Thought content normal.      ED Treatments / Results  Labs (all labs ordered are listed, but only abnormal results are displayed) Labs Reviewed  CBC WITH DIFFERENTIAL/PLATELET - Abnormal; Notable for the following components:      Result Value   WBC 11.6 (*)    RBC 3.87 (*)    Hemoglobin 10.6 (*)    HCT 35.6 (*)    MCHC 29.8 (*)    RDW 16.1 (*)    Platelets 435 (*)    Neutro Abs 8.8 (*)    All other components within normal limits  COMPREHENSIVE METABOLIC PANEL - Abnormal; Notable for the following components:   Glucose, Bld 104 (*)    Total Protein 6.4 (*)    Albumin 3.4 (*)    All other components within normal limits    EKG    Radiology No results found.   Ct Abdomen Pelvis W Contrast  Result Date: 09/17/2018 CLINICAL DATA:  Right lower quadrant pain, hernia, mass EXAM: CT ABDOMEN AND PELVIS WITH CONTRAST TECHNIQUE: Multidetector CT imaging of the abdomen and pelvis was performed using the standard protocol following bolus administration of intravenous contrast. CONTRAST:  170mL OMNIPAQUE IOHEXOL 300 MG/ML  SOLN COMPARISON:  None. FINDINGS: Lower chest: No acute abnormality. Coronary artery calcifications and/or stents. Aortic valve calcifications. Hepatobiliary: No solid liver abnormality is seen. Subcentimeter low-attenuation lesions lesions of the left lobe of the liver (series 3, image 16) and subcapsular right lobe of the liver (series 3, image 22), incompletely characterized although likely simple cysts or hemangiomata. No gallstones, gallbladder wall thickening, or biliary dilatation. Pancreas: Unremarkable. No pancreatic ductal dilatation or surrounding inflammatory changes. Spleen: Normal in size without significant abnormality. Adrenals/Urinary Tract: Adrenal glands are unremarkable. Kidneys are normal, without renal calculi, solid lesion, or hydronephrosis. Bladder is unremarkable. Stomach/Bowel: Stomach is within normal limits. Appendix appears  normal. There is a circumferential mass of the mid cecum (series 3, image 35, series 6, image 35) with a distended cecal base and distended, fecalized terminal ileum, consistent with partial obstipation. Vascular/Lymphatic: Aortic atherosclerosis. There are enlarged lymph nodes in the right lower quadrant mesocolon measuring up to 1.3 x 1.0 cm (series 3, image 47). Reproductive: Prostate brachytherapy pellets Other: No abdominal wall hernia or abnormality. No abdominopelvic ascites. Musculoskeletal: No acute or significant osseous findings. IMPRESSION: 1. There is a circumferential mass  of the mid cecum concerning for primary colon malignancy (series 3, image 35, series 6, image 35) with a distended cecal base and distended, fecalized terminal ileum, consistent with partial obstipation. 2.  No evidence of abdominal wall hernia. 3. There are enlarged lymph nodes in the right lower quadrant mesocolon measuring up to 1.3 x 1.0 cm (series 3, image 47) but no definite evidence of metastatic disease in the abdomen or pelvis. 4. Subcentimeter low-attenuation lesions lesions of the left lobe of the liver (series 3, image 16) and subcapsular right lobe of the liver (series 3, image 22), incompletely characterized although likely simple cysts or hemangiomata. Attention on follow-up. 5. Other chronic, incidental, and postoperative findings as detailed above. Electronically Signed   By: Eddie Candle M.D.   On: 09/17/2018 14:08     Procedures Procedures (including critical care time)  Medications Ordered in ED Medications  iohexol (OMNIPAQUE) 300 MG/ML solution 100 mL (100 mLs Intravenous Contrast Given 09/17/18 1355)     Initial Impression / Assessment and Plan / ED Course  I have reviewed the triage vital signs and the nursing notes.  Pertinent labs & imaging results that were available during my care of the patient were reviewed by me and considered in my medical decision making (see chart for details).        77yM with abdominal pain and mass. CT findings discussed with pt. Unfortunately, likely colon CA. Will need close GI FU and like colonoscopy to further evaluate.   Final Clinical Impressions(s) / ED Diagnoses   Final diagnoses:  Colonic mass    ED Discharge Orders    None       Virgel Manifold, MD 09/22/18 1342

## 2018-09-24 DIAGNOSIS — C189 Malignant neoplasm of colon, unspecified: Secondary | ICD-10-CM

## 2018-09-24 DIAGNOSIS — R933 Abnormal findings on diagnostic imaging of other parts of digestive tract: Secondary | ICD-10-CM | POA: Diagnosis not present

## 2018-09-24 DIAGNOSIS — D128 Benign neoplasm of rectum: Secondary | ICD-10-CM | POA: Diagnosis not present

## 2018-09-24 DIAGNOSIS — C180 Malignant neoplasm of cecum: Secondary | ICD-10-CM | POA: Diagnosis not present

## 2018-09-24 HISTORY — DX: Malignant neoplasm of colon, unspecified: C18.9

## 2018-09-25 ENCOUNTER — Other Ambulatory Visit: Payer: Self-pay

## 2018-09-25 ENCOUNTER — Ambulatory Visit (INDEPENDENT_AMBULATORY_CARE_PROVIDER_SITE_OTHER): Payer: PRIVATE HEALTH INSURANCE | Admitting: Family Medicine

## 2018-09-25 ENCOUNTER — Encounter: Payer: Self-pay | Admitting: Family Medicine

## 2018-09-25 VITALS — BP 98/62 | HR 65 | Temp 98.0°F | Ht 71.5 in | Wt 137.2 lb

## 2018-09-25 DIAGNOSIS — R1903 Right lower quadrant abdominal swelling, mass and lump: Secondary | ICD-10-CM | POA: Diagnosis not present

## 2018-09-25 NOTE — Progress Notes (Signed)
   Subjective:    Patient ID: Oscar Sparks, male    DOB: Feb 25, 1941, 78 y.o.   MRN: [id number]  HPI Here to follow up on an ER visit on 09-16-28. We saw him that day for swelling in the abdomen, and on exam he had a large mass in the RLQ. At the ER a CT scan revealed a cecal mass that most likely represents a colon cancer. He also had a partial small bowel obstruction, but this has since resolved. The swelling in the abdomen has gone back down. His labs were unremarkable that day. He was seen urgently by Dr. Cristina Gong yesterday and he had a colonoscopy. Dr.Buccini confirmed he likely has a cancer, and biopsy results are pending. We had also done a referral to Surgery and they spoke to the patient, but they agreed to wait in the tissue diagnosis before they saw him. Today he feels fine except for some mild weakness. No abdominal pain or nausea. His appetite is good. He is having normal BMs.    Review of Systems  Constitutional: Positive for fatigue. Negative for fever.  Respiratory: Negative.   Cardiovascular: Negative.   Gastrointestinal: Negative.   Genitourinary: Negative.        Objective:   Physical Exam Constitutional:      Appearance: Normal appearance. He is not ill-appearing.  Cardiovascular:     Rate and Rhythm: Normal rate and regular rhythm.     Pulses: Normal pulses.     Heart sounds: Normal heart sounds.  Pulmonary:     Effort: Pulmonary effort is normal.     Breath sounds: Normal breath sounds.  Abdominal:     General: Abdomen is flat. Bowel sounds are normal. There is no distension.     Palpations: Abdomen is soft.     Tenderness: There is no abdominal tenderness. There is no guarding or rebound.     Hernia: No hernia is present.     Comments: A firm mass is felt in the RLQ. He is no longer distended like he was at his last exam here   Neurological:     Mental Status: He is alert.           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 We await the biopsy  results, and he will follow up with Dr. Cristina Gong. He had a partial SBO but this has resolved. I encouraged him to eat plenty of fruits and vegetables, to keep meats to a minimum, and to drink plenty of water. We will follow along. Alysia Penna, MD

## 2018-09-30 ENCOUNTER — Ambulatory Visit: Payer: Self-pay | Admitting: Surgery

## 2018-09-30 DIAGNOSIS — C182 Malignant neoplasm of ascending colon: Secondary | ICD-10-CM | POA: Diagnosis not present

## 2018-09-30 DIAGNOSIS — M40294 Other kyphosis, thoracic region: Secondary | ICD-10-CM

## 2018-09-30 DIAGNOSIS — M40204 Unspecified kyphosis, thoracic region: Secondary | ICD-10-CM | POA: Insufficient documentation

## 2018-09-30 NOTE — H&P (Signed)
Oscar Sparks Documented: 09/30/2018 9:21 AM Location: [facility] Patient #: [id number] DOB: 1940/08/08 Married / Language: English / Race: White Male  History of Present Illness Oscar Hector MD; 09/30/2018 10:06 AM) The patient is a 78 year old male who presents with a colonic mass. Note for "Colonic mass": ` ` ` Patient sent for surgical consultation at the request of Dr Oscar Sparks  Chief Complaint: Partially obstructing mass of cecum ` ` The patient is an active male who has noticed a lump in the right side of his abdomen. Had some crampy abdominal pain. Concern for possible bowel obstruction symptoms. Saw primary care physician. CAT scan revealed a bulky mass in the right colon. Noted is possibly cecum although clearly has dilated colon proximal to it. He improved on his own nonoperatively. Sent for urgent colonoscopy. Dr. Ernest Sparks removed as a few small adenomas in his rectum. Encountered a bulky mass in his proximal colon very suspicious for adenocarcinoma. Biopsies done. Pathology revealed results not back yet as of this morning. However suspicious for cancer. Surgical consultation requested.  The patient is not a smoker. Not diabetic. He walks a mile a day without difficulty. He's never had any abdominal surgery. Usually moves his bowels every morning. He has had colonoscopies in the past. His last one was over 5 years ago. He knows it was in town but not recall who did it. No personal nor family history of GI/colon cancer, inflammatory bowel disease, irritable bowel syndrome, allergy such as Celiac Sprue, dietary/dairy problems, colitis, ulcers nor gastritis. No recent sick contacts/gastroenteritis. No travel outside the country. No changes in diet. No dysphagia to solids or liquids. No significant heartburn or reflux. No hematochezia, hematemesis, coffee ground emesis. No evidence of prior gastric/peptic ulceration. Mother had breast cancer possible  bone metastases. Father had cardiac issues. The patient cells never had any heart issues. No exertional chest pain or shortness of breath. He is married. He is here today by himself.  (Review of systems as stated in this history (HPI) or in the review of systems. Otherwise all other 12 point ROS are negative) ` ` `   Past Surgical History Emeline Gins, [place]; 09/30/2018 9:31 AM) No pertinent past surgical history  Diagnostic Studies History Emeline Gins, [place]; 09/30/2018 9:31 AM) Colonoscopy within last year  Allergies Emeline Gins, CMA; 09/30/2018 9:32 AM) No Known Drug Allergies [09/30/2018]: Allergies Reconciled  Medication History Emeline Gins, CMA; 09/30/2018 9:35 AM) Iron (65MG  Tablet, Oral) Active. Omega 3 (1000MG  Capsule, Oral) Active. Turmeric Curcumin (500MG  Capsule, Oral) Active. Vitamin B Complex (Oral) Active. Vitamin C (500MG  Tablet, Oral) Active. Vitamin D3 (250 MCG(10000 UT) Capsule, Oral) Active. Vitamin E (400UNIT Tablet, Oral) Active. Zinc (50MG  Tablet, Oral) Active. Medications Reconciled  Social History Emeline Gins, [place]; 09/30/2018 9:31 AM) Alcohol use Occasional alcohol use. Caffeine use Coffee. No drug use Tobacco use Never smoker.  Family History Emeline Gins, [place]; 09/30/2018 9:31 AM) Breast Cancer Mother. Heart Disease Father.  Other Problems Emeline Gins, [place]; 09/30/2018 9:31 AM) Colon Cancer     Review of Systems Emeline Gins CMA; 09/30/2018 9:31 AM) General Not Present- Appetite Loss, Chills, Fatigue, Fever, Night Sweats, Weight Gain and Weight Loss. Skin Not Present- Change in Wart/Mole, Dryness, Hives, Jaundice, New Lesions, Non-Healing Wounds, Rash and Ulcer. HEENT Not Present- Earache, Hearing Loss, Hoarseness, Nose Bleed, Oral Ulcers, Ringing in the Ears, Seasonal Allergies, Sinus Pain, Sore Throat, Visual Disturbances, Wears glasses/contact lenses and Yellow Eyes. Respiratory Not Present- Bloody  sputum, Chronic Cough,  Difficulty Breathing, Snoring and Wheezing. Breast Not Present- Breast Mass, Breast Pain, Nipple Discharge and Skin Changes. Cardiovascular Not Present- Chest Pain, Difficulty Breathing Lying Down, Leg Cramps, Palpitations, Rapid Heart Rate, Shortness of Breath and Swelling of Extremities. Gastrointestinal Not Present- Abdominal Pain, Bloating, Bloody Stool, Change in Bowel Habits, Chronic diarrhea, Constipation, Difficulty Swallowing, Excessive gas, Gets full quickly at meals, Hemorrhoids, Indigestion, Nausea, Rectal Pain and Vomiting. Male Genitourinary Not Present- Blood in Urine, Change in Urinary Stream, Frequency, Impotence, Nocturia, Painful Urination, Urgency and Urine Leakage. Musculoskeletal Not Present- Back Pain, Joint Pain, Joint Stiffness, Muscle Pain, Muscle Weakness and Swelling of Extremities. Neurological Not Present- Decreased Memory, Fainting, Headaches, Numbness, Seizures, Tingling, Tremor, Trouble walking and Weakness. Psychiatric Not Present- Anxiety, Bipolar, Change in Sleep Pattern, Depression, Fearful and Frequent crying. Endocrine Not Present- Cold Intolerance, Excessive Hunger, Hair Changes, Heat Intolerance, Hot flashes and New Diabetes. Hematology Not Present- Blood Thinners, Easy Bruising, Excessive bleeding, Gland problems, HIV and Persistent Infections.  Vitals Emeline Gins CMA; 09/30/2018 9:32 AM) 09/30/2018 9:31 AM Weight: 137.4 lb Height: 71in Body Surface Area: 1.8 m Body Mass Index: 19.16 kg/m  Temp.: 98.52F  Pulse: 84 (Regular)  BP: 124/76 (Sitting, Left Arm, Standard)        Physical Exam Oscar Hector MD; 09/30/2018 9:58 AM)  General Mental Status-Alert. General Appearance-Not in acute distress, Not Sickly. Orientation-Oriented X3. Hydration-Well hydrated. Voice-Normal. Note: Thin but active. Bright and alert. Asks appropriate questions.  Integumentary Global Assessment Upon inspection and  palpation of skin surfaces of the - Axillae: non-tender, no inflammation or ulceration, no drainage. and Distribution of scalp and body hair is normal. General Characteristics Temperature - normal warmth is noted.  Head and Neck Head-normocephalic, atraumatic with no lesions or palpable masses. Face Global Assessment - atraumatic, no absence of expression. Neck Global Assessment - no abnormal movements, no bruit auscultated on the right, no bruit auscultated on the left, no decreased range of motion, non-tender. Trachea-midline. Thyroid Gland Characteristics - non-tender.  Eye Eyeball - Left-Extraocular movements intact, No Nystagmus - Left. Eyeball - Right-Extraocular movements intact, No Nystagmus - Right. Cornea - Left-No Hazy - Left. Cornea - Right-No Hazy - Right. Sclera/Conjunctiva - Left-No scleral icterus, No Discharge - Left. Sclera/Conjunctiva - Right-No scleral icterus, No Discharge - Right. Pupil - Left-Direct reaction to light normal. Pupil - Right-Direct reaction to light normal.  ENMT Ears Pinna - Left - no drainage observed, no generalized tenderness observed. Pinna - Right - no drainage observed, no generalized tenderness observed. Nose and Sinuses External Inspection of the Nose - no destructive lesion observed. Inspection of the nares - Left - quiet respiration. Inspection of the nares - Right - quiet respiration. Mouth and Throat Lips - Upper Lip - no fissures observed, no pallor noted. Lower Lip - no fissures observed, no pallor noted. Nasopharynx - no discharge present. Oral Cavity/Oropharynx - Tongue - no dryness observed. Oral Mucosa - no cyanosis observed. Hypopharynx - no evidence of airway distress observed.  Chest and Lung Exam Inspection Movements - Normal and Symmetrical. Accessory muscles - No use of accessory muscles in breathing. Palpation Palpation of the chest reveals - Non-tender. Auscultation Breath sounds - Normal and  Clear.  Cardiovascular Auscultation Rhythm - Regular. Murmurs & Other Heart Sounds - Auscultation of the heart reveals - No Murmurs and No Systolic Clicks.  Abdomen Inspection Inspection of the abdomen reveals - No Visible peristalsis and No Abnormal pulsations. Umbilicus - No Bleeding, No Urine drainage. Palpation/Percussion Palpation and Percussion of  the abdomen reveal - Soft, Non Tender, No Rebound tenderness, No Rigidity (guarding) and No Cutaneous hyperesthesia. Note: Abdomen flat & soft. Nontender. Not distended. Thin. Firm somewhat mobile mass in right paramedian region. Not tender. No umbilical or incisional hernias. No guarding.  Male Genitourinary Sexual Maturity Tanner 5 - Adult hair pattern and Adult penile size and shape. Note: No inguinal hernias. Normal external genitalia. Epididymi, testes, and spermatic cords normal without any masses.  Rectal Note: Deferred given recent colonoscopy.  Peripheral Vascular Upper Extremity Inspection - Left - No Cyanotic nailbeds - Left, Not Ischemic. Inspection - Right - No Cyanotic nailbeds - Right, Not Ischemic.  Neurologic Neurologic evaluation reveals -normal attention span and ability to concentrate, able to name objects and repeat phrases. Appropriate fund of knowledge , normal sensation and normal coordination. Mental Status Affect - not angry, not paranoid. Cranial Nerves-Normal Bilaterally. Gait-Normal.  Neuropsychiatric Mental status exam performed with findings of-able to articulate well with normal speech/language, rate, volume and coherence, thought content normal with ability to perform basic computations and apply abstract reasoning and no evidence of hallucinations, delusions, obsessions or homicidal/suicidal ideation.  Musculoskeletal Global Assessment Spine, Ribs and Pelvis - no instability, subluxation or laxity. Right Upper Extremity - no instability, subluxation or laxity. Note: Moderate  kyphosis of thoracic spine  Lymphatic Head & Neck  General Head & Neck Lymphatics: Bilateral - Description - No Localized lymphadenopathy. Axillary  General Axillary Region: Bilateral - Description - No Localized lymphadenopathy. Femoral & Inguinal  Generalized Femoral & Inguinal Lymphatics: Left - Description - No Localized lymphadenopathy. Right - Description - No Localized lymphadenopathy.    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  Pathology is not back yet although pictures and CAT scan highly suspicious. He reached out to the pathology department and asked them to try and get any answer in the next day or so.  CT scan of chest and CEA blood level to complete colon cancer workup.  Review of benefit from segmental colonic resection. Good candidate for minimally invasive approach. Ideally would do a robotic medial to lateral resection with intracorporeal anastomosis. Extraction at the Pfannenstiel incision. We can feel something palpable, I suspect it's is chronically distended cecum more than anything else. The tumor itself does not look particularly bulky on CAT scan. It seems more at the hepatic flexure and the cecum to be, but it doesn't change the plan of surgical removal. He is quite active and does not smoke. He is interesting proceeding as soon as possible.   PREOP COLON - ENCOUNTER FOR PREOPERATIVE EXAMINATION FOR GENERAL SURGICAL PROCEDURE (Z01.818)  Current Plans You are being scheduled for surgery- Our schedulers will call you.  You should hear from our office's scheduling department within 5 working days about the location, date, and time of surgery. We try to make accommodations for patient's preferences in scheduling surgery, but sometimes the OR schedule or the surgeon's schedule prevents [place] from making  those accommodations.  If you have not heard from our office [phone]) in 5 working days, call the office and ask for your surgeon's nurse.  If you have other questions about your diagnosis, plan, or surgery, call the office and ask for your surgeon's nurse.  Written instructions provided The anatomy & physiology of the digestive tract was discussed. The pathophysiology of the colon was discussed. Natural history risks without surgery was discussed.  I feel the risks of no intervention will lead to serious problems that outweigh the operative risks; therefore, I recommended a partial colectomy to remove the pathology. Minimally invasive (Robotic/Laparoscopic) & open techniques were discussed.  Risks such as bleeding, infection, abscess, leak, reoperation, possible ostomy, hernia, heart attack, death, and other risks were discussed. I noted a good likelihood this will help address the problem. Goals of post-operative recovery were discussed as well. Need for adequate nutrition, daily bowel regimen and healthy physical activity, to optimize recovery was noted as well. We will work to minimize complications. Educational materials were available as well. Questions were answered. The patient expresses understanding & wishes to proceed with surgery.  Pt Education - [facility] Colon Bowel Prep 2018 ERAS/Miralax/Antibiotics Started Neomycin Sulfate 500 MG Oral Tablet, 2 (two) Tablet SEE NOTE, #6, 09/30/2018, No Refill. Local Order: Pharmacist Notes: TAKE TWO TABLETS AT 2 PM, 3 PM, AND 10 PM THE DAY PRIOR TO SURGERY Started Flagyl 500 MG Oral Tablet, 2 (two) Tablet SEE NOTE, #6, 09/30/2018, No Refill. Local Order: Pharmacist Notes: Take at 2pm, 3pm, and 10pm the day prior to your colon operation Pt Education - Pamphlet Given - Laparoscopic Colorectal Surgery: discussed with patient and provided information. Pt Education - [facility] Colectomy post-op instructions: discussed with patient and provided  information.

## 2018-10-08 ENCOUNTER — Other Ambulatory Visit: Payer: Self-pay | Admitting: Surgery

## 2018-10-08 DIAGNOSIS — C182 Malignant neoplasm of ascending colon: Secondary | ICD-10-CM

## 2018-11-08 ENCOUNTER — Other Ambulatory Visit: Payer: Self-pay

## 2018-11-08 ENCOUNTER — Ambulatory Visit
Admission: RE | Admit: 2018-11-08 | Discharge: 2018-11-08 | Disposition: A | Discharge status: Home / Self Care | Payer: PRIVATE HEALTH INSURANCE | Source: Ambulatory Visit | Attending: Surgery | Admitting: Surgery

## 2018-11-08 ENCOUNTER — Inpatient Hospital Stay: Admission: RE | Admit: 2018-11-08 | Payer: Medicare Other | Source: Ambulatory Visit

## 2018-11-08 DIAGNOSIS — Z85038 Personal history of other malignant neoplasm of large intestine: Secondary | ICD-10-CM | POA: Diagnosis not present

## 2018-11-08 DIAGNOSIS — C182 Malignant neoplasm of ascending colon: Secondary | ICD-10-CM

## 2018-11-08 DIAGNOSIS — K6389 Other specified diseases of intestine: Secondary | ICD-10-CM | POA: Diagnosis not present

## 2018-11-08 MED ORDER — IOPAMIDOL (ISOVUE-300) INJECTION 61%
75.0000 mL | Freq: Once | INTRAVENOUS | Status: AC | PRN
  Administered 2018-11-08: 75 mL via INTRAVENOUS

## 2018-11-12 ENCOUNTER — Other Ambulatory Visit: Payer: Self-pay

## 2018-11-12 ENCOUNTER — Encounter: Payer: Self-pay | Admitting: Family Medicine

## 2018-11-12 ENCOUNTER — Ambulatory Visit (INDEPENDENT_AMBULATORY_CARE_PROVIDER_SITE_OTHER): Payer: PRIVATE HEALTH INSURANCE | Admitting: Family Medicine

## 2018-11-12 VITALS — BP 140/58 | HR 81 | Temp 98.3°F

## 2018-11-12 DIAGNOSIS — S300XXA Contusion of lower back and pelvis, initial encounter: Secondary | ICD-10-CM

## 2018-11-13 ENCOUNTER — Encounter: Payer: Self-pay | Admitting: Family Medicine

## 2018-11-13 NOTE — Progress Notes (Signed)
   Subjective:    Patient ID: Oscar Sparks, male    DOB: [birth year], 78 y.o.   MRN: [id number]  HPI Here for injuries from a fall at home one week ago. While turning around he tripped over his feet and fell backwards, landing on his bottom. He immediately had pain and numbness down the left leg. Since then the pain has been improving every day, and the numbness has resolved. No back pain. No weakness. He has been applying ice and heat to the left buttock, and he has taken Ibuprofen.    Review of Systems  Constitutional: Negative.   Respiratory: Negative.   Cardiovascular: Negative.   Musculoskeletal: Positive for myalgias.  Neurological: Positive for numbness.       Objective:   Physical Exam Constitutional:      Comments: Able to walk with a limp   Cardiovascular:     Rate and Rhythm: Normal rate and regular rhythm.     Pulses: Normal pulses.     Heart sounds: Normal heart sounds.  Pulmonary:     Effort: Pulmonary effort is normal.     Breath sounds: Normal breath sounds.  Musculoskeletal:     Comments: He is tender over the left sciatic notch, but lower spine has full ROM. Left hip is intact with full ROM.   Neurological:     General: No focal deficit present.     Mental Status: He is alert and oriented to person, place, and time.     Motor: No weakness.           Assessment & Plan:  He has a contusion to the left sciatic nerve. This seems to be resolving as expected. He will rest and take Ibuprofen as needed. He is written out of work from 11-11-18 until 11-18-18. Recheck prn.  Alysia Penna, MD

## 2018-11-15 NOTE — Patient Instructions (Signed)
YOU NEED TO HAVE A COVID 19 TEST ON_Sat 8/22______ @_______ , THIS TEST MUST BE DONE BEFORE SURGERY, COME  [name], Oscar Sparks , [phone] . ONCE YOUR COVID TEST IS COMPLETED, PLEASE BEGIN THE QUARANTINE INSTRUCTIONS AS OUTLINED IN YOUR HANDOUT.                Oscar Sparks   Your procedure is scheduled on: Wednesday 11/20/18   Report to [facility] Main  Entrance Report to admitting at 6:30 AM   1 VISITOR IS ALLOWED TO WAIT IN WAITING ROOM  ONLY DAY OF YOUR SURGERY.  NO VISITORS ARE ALLOWED IN SHORT STAY OR RECOVERY ROOM.   Call this number if you have problems the morning of surgery [place] AND RINSE YOUR MOUTH OUT, NO CHEWING GUM CANDY OR MINTS.  No solid food after midnight on Monday night 8/24 . The day before surgery Tues 8/25  Start your clear liquid diet.   Drink lots of liquid to prevent dehydration. Follow the bowel prep instructions given by Dr. [name] office  CLEAR LIQUID DIET   Foods Allowed                                                                     Foods Excluded  Coffee and tea, regular and decaf                             liquids that you cannot  Plain Jell-O any favor except red or purple                                           see through such as: Fruit ices (not with fruit pulp)                                     milk, soups, orange juice  Iced Popsicles                                    All solid food Carbonated beverages, regular and diet                                    Cranberry, grape and apple juices Sports drinks like Gatorade Lightly seasoned clear broth or consume(fat free) Sugar, honey syrup  Sample Menu Breakfast                                Lunch                                     Supper Cranberry juice  Beef broth                            Chicken broth Jell-O                                     Grape juice                           Apple  juice Coffee or tea                        Jell-O                                      Popsicle                                                Coffee or tea                        Coffee or tea  _____________________________________________________________________  YOU MAY HAVE CLEAR LIQUIDS FROM MIDNIGHT UNTIL 4:30AM. The morning of surgery.   At 4:30AM Please finish the prescribed Pre-Surgery Ensure drink.  Nothing by mouth after you finish the Ensure drink !   Take these medicines the morning of surgery with A SIP OF WATER:none                                 You may not have any metal on your body including piercings              Do not wear jewelry, lotions, powders or  deodorant                    Men may shave face and neck.   Do not bring valuables to the hospital. [place].  Contacts, dentures or bridgework may not be worn into surgery.       Name and phone number of your driver:  Special Instructions: N/A              Please read over the following fact sheets you were given: _____________________________________________________________________             [facility] - Preparing for Surgery Before surgery, you can play an important role.   Because skin is not sterile, your skin needs to be as free of germs as possible.   You can reduce the number of germs on your skin by washing with CHG (chlorahexidine gluconate) soap before surgery.   CHG is an antiseptic cleaner which kills germs and bonds with the skin to continue killing germs even after washing. Please DO NOT use if you have an allergy to CHG or antibacterial soaps.   If your skin becomes reddened/irritated stop using the CHG and inform your nurse when you arrive at Short Stay.   You may shave your face/neck. Please follow these instructions carefully:  1.  Shower with CHG Soap the night before surgery and the  morning of Surgery.  2.  If you choose to  wash your hair, wash your hair first as usual with your  normal  shampoo.  3.  After you shampoo, rinse your hair and body thoroughly to remove the  shampoo.                                        4.  Use CHG as you would any other liquid soap.  You can apply chg directly  to the skin and wash                       Gently with a scrungie or clean washcloth.  5.  Apply the CHG Soap to your body ONLY FROM THE NECK DOWN.   Do not use on face/ open                           Wound or open sores. Avoid contact with eyes, ears mouth and genitals (private parts).                       Wash face,  Genitals (private parts) with your normal soap.             6.  Wash thoroughly, paying special attention to the area where your surgery  will be performed.  7.  Thoroughly rinse your body with warm water from the neck down.  8.  DO NOT shower/wash with your normal soap after using and rinsing off  the CHG Soap.             9.  Pat yourself dry with a clean towel.            10.  Wear clean pajamas.            11.  Place clean sheets on your bed the night of your first shower and do not  sleep with pets. Day of Surgery : Do not apply any lotions/deodorants the morning of surgery.  Please wear clean clothes to the hospital/surgery center.  FAILURE TO FOLLOW THESE INSTRUCTIONS MAY RESULT IN THE CANCELLATION OF YOUR SURGERY PATIENT SIGNATURE_________________________________  NURSE SIGNATURE__________________________________  ________________________________________________________________________   [name]  An incentive spirometer is a tool that can help keep your lungs clear and active. This tool measures how well you are filling your lungs with each breath. Taking long deep breaths may help reverse or decrease the chance of developing breathing (pulmonary) problems (especially infection) following:  A long period of time when you are unable to move or be active. BEFORE THE PROCEDURE   If  the spirometer includes an indicator to show your best effort, your nurse or respiratory therapist will set it to a desired goal.  If possible, sit up straight or lean slightly forward. Try not to slouch.  Hold the incentive spirometer in an upright position. INSTRUCTIONS FOR USE  1. Sit on the edge of your bed if possible, or sit up as far as you can in bed or on a chair. 2. Hold the incentive spirometer in an upright position. 3. Breathe out normally. 4. Place the mouthpiece in your mouth and seal your lips tightly around it. 5. Breathe in slowly and as deeply as possible, raising  the piston or the ball toward the top of the column. 6. Hold your breath for 3-5 seconds or for as long as possible. Allow the piston or ball to fall to the bottom of the column. 7. Remove the mouthpiece from your mouth and breathe out normally. 8. Rest for a few seconds and repeat Steps 1 through 7 at least 10 times every 1-2 hours when you are awake. Take your time and take a few normal breaths between deep breaths. 9. The spirometer may include an indicator to show your best effort. Use the indicator as a goal to work toward during each repetition. 10. After each set of 10 deep breaths, practice coughing to be sure your lungs are clear. If you have an incision (the cut made at the time of surgery), support your incision when coughing by placing a pillow or rolled up towels firmly against it. Once you are able to get out of bed, walk around indoors and cough well. You may stop using the incentive spirometer when instructed by your caregiver.  RISKS AND COMPLICATIONS  Take your time so you do not get dizzy or light-headed.  If you are in pain, you may need to take or ask for pain medication before doing incentive spirometry. It is harder to take a deep breath if you are having pain. AFTER USE  Rest and breathe slowly and easily.  It can be helpful to keep track of a log of your progress. Your caregiver can  provide you with a simple table to help with this. If you are using the spirometer at home, follow these instructions: [place] IF:   You are having difficultly using the spirometer.  You have trouble using the spirometer as often as instructed.  Your pain medication is not giving enough relief while using the spirometer.  You develop fever of 100.5 F (38.1 C) or higher. SEEK IMMEDIATE MEDICAL CARE IF:   You cough up bloody sputum that had not been present before.  You develop fever of 102 F (38.9 C) or greater.  You develop worsening pain at or near the incision site. MAKE SURE YOU:   Understand these instructions.  Will watch your condition.  Will get help right away if you are not doing well or get worse. Document Released: 07/24/2006 Document Revised: 06/05/2011 Document Reviewed: 09/24/2006 [facility] Patient Information 2014 [place], [place].   ________________________________________________________________________

## 2018-11-16 ENCOUNTER — Other Ambulatory Visit (HOSPITAL_COMMUNITY)
Admission: RE | Admit: 2018-11-16 | Discharge: 2018-11-16 | Disposition: A | Discharge status: Home / Self Care | Payer: PRIVATE HEALTH INSURANCE | Source: Ambulatory Visit | Attending: Surgery | Admitting: Surgery

## 2018-11-16 DIAGNOSIS — Z20828 Contact with and (suspected) exposure to other viral communicable diseases: Secondary | ICD-10-CM | POA: Insufficient documentation

## 2018-11-16 DIAGNOSIS — Z01812 Encounter for preprocedural laboratory examination: Secondary | ICD-10-CM | POA: Insufficient documentation

## 2018-11-16 LAB — SARS CORONAVIRUS 2 (TAT 6-24 HRS): SARS Coronavirus 2: NEGATIVE

## 2018-11-18 ENCOUNTER — Encounter (HOSPITAL_COMMUNITY)
Admission: RE | Admit: 2018-11-18 | Discharge: 2018-11-18 | Disposition: A | Discharge status: Home / Self Care | Payer: PRIVATE HEALTH INSURANCE | Source: Ambulatory Visit | Attending: Surgery | Admitting: Surgery

## 2018-11-18 ENCOUNTER — Other Ambulatory Visit (HOSPITAL_COMMUNITY): Payer: Medicare Other

## 2018-11-18 ENCOUNTER — Encounter (HOSPITAL_COMMUNITY): Payer: Self-pay

## 2018-11-18 ENCOUNTER — Other Ambulatory Visit: Payer: Self-pay

## 2018-11-18 DIAGNOSIS — Z01818 Encounter for other preprocedural examination: Secondary | ICD-10-CM | POA: Insufficient documentation

## 2018-11-18 DIAGNOSIS — I739 Peripheral vascular disease, unspecified: Secondary | ICD-10-CM | POA: Insufficient documentation

## 2018-11-18 HISTORY — DX: Unspecified osteoarthritis, unspecified site: M19.90

## 2018-11-18 HISTORY — DX: Peripheral vascular disease, unspecified: I73.9

## 2018-11-18 HISTORY — DX: Myoneural disorder, unspecified: G70.9

## 2018-11-18 LAB — CBC
HCT: 28.6 % — ABNORMAL LOW (ref 39.0–52.0)
Hemoglobin: 8.1 g/dL — ABNORMAL LOW (ref 13.0–17.0)
MCH: 24.5 pg — ABNORMAL LOW (ref 26.0–34.0)
MCHC: 28.3 g/dL — ABNORMAL LOW (ref 30.0–36.0)
MCV: 86.4 fL (ref 80.0–100.0)
Platelets: 484 10*3/uL — ABNORMAL HIGH (ref 150–400)
RBC: 3.31 MIL/uL — ABNORMAL LOW (ref 4.22–5.81)
RDW: 15.9 % — ABNORMAL HIGH (ref 11.5–15.5)
WBC: 10.8 10*3/uL — ABNORMAL HIGH (ref 4.0–10.5)
nRBC: 0 % (ref 0.0–0.2)

## 2018-11-18 LAB — BASIC METABOLIC PANEL
Anion gap: 8 (ref 5–15)
BUN: 28 mg/dL — ABNORMAL HIGH (ref 8–23)
CO2: 28 mmol/L (ref 22–32)
Calcium: 9.2 mg/dL (ref 8.9–10.3)
Chloride: 106 mmol/L (ref 98–111)
Creatinine, Ser: 0.85 mg/dL (ref 0.61–1.24)
GFR calc Af Amer: >60 mL/min (ref >60)
GFR calc non Af Amer: >60 mL/min (ref >60)
Glucose, Bld: 99 mg/dL (ref 70–99)
Potassium: 4.4 mmol/L (ref 3.5–5.1)
Sodium: 142 mmol/L (ref 135–145)

## 2018-11-18 LAB — HEMOGLOBIN A1C
Hgb A1c MFr Bld: 4.9 % (ref 4.8–5.6)
Mean Plasma Glucose: 93.93 mg/dL

## 2018-11-18 MED ORDER — ENSURE PRE-SURGERY PO LIQD
592.0000 mL | Freq: Once | ORAL | Status: DC
  Filled 2018-11-18: qty 592

## 2018-11-18 MED ORDER — ENSURE PRE-SURGERY PO LIQD
296.0000 mL | Freq: Once | ORAL | Status: DC
  Filled 2018-11-18: qty 296

## 2018-11-18 NOTE — Progress Notes (Signed)
CRITICAL VALUE ALERT  Critical Value:  Hgb 8.1  Date & Time Notied:  1745  Provider Notified: Janett Billow PA at 17:54, Dr. Johney [place] through the answering service at 17:50  Orders Received/Actions taken:Dr. Harlow Asa, who was on call,  called Dr. Johney [place] and reported the value. He has no orders at this time.

## 2018-11-19 MED ORDER — SODIUM CHLORIDE 0.9 % IV SOLN
INTRAVENOUS | Status: DC
  Filled 2018-11-19: qty 6

## 2018-11-19 MED ORDER — BUPIVACAINE LIPOSOME 1.3 % IJ SUSP
20.0000 mL | Freq: Once | INTRAMUSCULAR | Status: DC
  Filled 2018-11-19: qty 20

## 2018-11-19 NOTE — Anesthesia Preprocedure Evaluation (Addendum)
Anesthesia Evaluation  Patient identified by MRN, date of birth, ID band Patient awake    Reviewed: Allergy & Precautions, NPO status , Patient's Chart, lab work & pertinent test results  History of Anesthesia Complications Negative for: history of anesthetic complications  Airway Mallampati: II  TM Distance: >3 FB Neck ROM: Full    Dental  (+) Dental Advisory Given, Teeth Intact   Pulmonary neg pulmonary ROS,    Pulmonary exam normal        Cardiovascular (-) angina+ Peripheral Vascular Disease  Normal cardiovascular exam     Neuro/Psych  Piriformis syndrome on left side  negative psych ROS   GI/Hepatic Neg liver ROS,  Colon cancer    Endo/Other  negative endocrine ROS  Renal/GU negative Renal ROS    Prostate cancer     Musculoskeletal  (+) Arthritis ,   Abdominal   Peds  Hematology negative hematology ROS (+)   Anesthesia Other Findings   Reproductive/Obstetrics                            Anesthesia Physical Anesthesia Plan  ASA: III  Anesthesia Plan: General   Post-op Pain Management:    Induction: Intravenous  PONV Risk Score and Plan: 3 and Treatment may vary due to age or medical condition, Ondansetron and Dexamethasone  Airway Management Planned: Oral ETT  Additional Equipment: None  Intra-op Plan:   Post-operative Plan: Extubation in OR  Informed Consent: I have reviewed the patients History and Physical, chart, labs and discussed the procedure including the risks, benefits and alternatives for the proposed anesthesia with the patient or authorized representative who has indicated his/her understanding and acceptance.     Dental advisory given  Plan Discussed with: CRNA and Anesthesiologist  Anesthesia Plan Comments:        Anesthesia Quick Evaluation

## 2018-11-20 ENCOUNTER — Encounter (HOSPITAL_COMMUNITY): Payer: Self-pay | Admitting: *Deleted

## 2018-11-20 ENCOUNTER — Other Ambulatory Visit: Payer: Self-pay

## 2018-11-20 ENCOUNTER — Inpatient Hospital Stay (HOSPITAL_COMMUNITY): Payer: PRIVATE HEALTH INSURANCE | Admitting: Physician Assistant

## 2018-11-20 ENCOUNTER — Inpatient Hospital Stay (HOSPITAL_COMMUNITY)
Admission: RE | Admit: 2018-11-20 | Discharge: 2018-11-24 | DRG: 330 | Disposition: A | Discharge status: Home Health | Payer: PRIVATE HEALTH INSURANCE | Attending: Surgery | Admitting: Surgery

## 2018-11-20 ENCOUNTER — Encounter (HOSPITAL_COMMUNITY)
Admission: RE | Disposition: A | Discharge status: Home Health | Payer: Self-pay | Source: Home / Self Care | Attending: Surgery

## 2018-11-20 DIAGNOSIS — Y92009 Unspecified place in unspecified non-institutional (private) residence as the place of occurrence of the external cause: Secondary | ICD-10-CM

## 2018-11-20 DIAGNOSIS — Z79899 Other long term (current) drug therapy: Secondary | ICD-10-CM | POA: Diagnosis not present

## 2018-11-20 DIAGNOSIS — I739 Peripheral vascular disease, unspecified: Secondary | ICD-10-CM | POA: Diagnosis present

## 2018-11-20 DIAGNOSIS — M170 Bilateral primary osteoarthritis of knee: Secondary | ICD-10-CM | POA: Diagnosis present

## 2018-11-20 DIAGNOSIS — R109 Unspecified abdominal pain: Secondary | ICD-10-CM | POA: Diagnosis not present

## 2018-11-20 DIAGNOSIS — Z23 Encounter for immunization: Secondary | ICD-10-CM

## 2018-11-20 DIAGNOSIS — C182 Malignant neoplasm of ascending colon: Secondary | ICD-10-CM | POA: Diagnosis not present

## 2018-11-20 DIAGNOSIS — M40204 Unspecified kyphosis, thoracic region: Secondary | ICD-10-CM | POA: Diagnosis not present

## 2018-11-20 DIAGNOSIS — D509 Iron deficiency anemia, unspecified: Secondary | ICD-10-CM | POA: Diagnosis present

## 2018-11-20 DIAGNOSIS — G629 Polyneuropathy, unspecified: Secondary | ICD-10-CM | POA: Diagnosis present

## 2018-11-20 DIAGNOSIS — M25552 Pain in left hip: Secondary | ICD-10-CM

## 2018-11-20 DIAGNOSIS — C61 Malignant neoplasm of prostate: Secondary | ICD-10-CM | POA: Diagnosis not present

## 2018-11-20 DIAGNOSIS — D62 Acute posthemorrhagic anemia: Secondary | ICD-10-CM | POA: Diagnosis not present

## 2018-11-20 DIAGNOSIS — W010XXA Fall on same level from slipping, tripping and stumbling without subsequent striking against object, initial encounter: Secondary | ICD-10-CM | POA: Diagnosis present

## 2018-11-20 SURGERY — COLECTOMY, PARTIAL, ROBOT-ASSISTED, LAPAROSCOPIC
Anesthesia: General | Site: Abdomen

## 2018-11-20 MED ORDER — VITAMIN C 500 MG PO TABS
1000.0000 mg | ORAL_TABLET | Freq: Every day | ORAL | Status: DC
  Administered 2018-11-21 – 2018-11-24 (×4): 1000 mg via ORAL
  Filled 2018-11-20 (×4): qty 2

## 2018-11-20 MED ORDER — SODIUM CHLORIDE 0.9 % IV SOLN
INTRAVENOUS | Status: DC | PRN
  Administered 2018-11-20: 1000 mL

## 2018-11-20 MED ORDER — MAGIC MOUTHWASH
15.0000 mL | Freq: Four times a day (QID) | ORAL | Status: DC | PRN
  Filled 2018-11-20: qty 15

## 2018-11-20 MED ORDER — DEXAMETHASONE SODIUM PHOSPHATE 10 MG/ML IJ SOLN
INTRAMUSCULAR | Status: DC | PRN
  Administered 2018-11-20: 10 mg via INTRAVENOUS

## 2018-11-20 MED ORDER — BUPIVACAINE-EPINEPHRINE 0.5% -1:200000 IJ SOLN
INTRAMUSCULAR | Status: AC
  Filled 2018-11-20: qty 1

## 2018-11-20 MED ORDER — ENOXAPARIN SODIUM 40 MG/0.4ML ~~LOC~~ SOLN
40.0000 mg | SUBCUTANEOUS | Status: DC
  Administered 2018-11-21 – 2018-11-24 (×4): 40 mg via SUBCUTANEOUS
  Filled 2018-11-20 (×4): qty 0.4

## 2018-11-20 MED ORDER — BUPIVACAINE-EPINEPHRINE (PF) 0.5% -1:200000 IJ SOLN
INTRAMUSCULAR | Status: DC | PRN
  Administered 2018-11-20: 30 mL

## 2018-11-20 MED ORDER — LIDOCAINE 2% (20 MG/ML) 5 ML SYRINGE
INTRAMUSCULAR | Status: AC
  Filled 2018-11-20: qty 5

## 2018-11-20 MED ORDER — PNEUMOCOCCAL VAC POLYVALENT 25 MCG/0.5ML IJ INJ
0.5000 mL | INJECTION | INTRAMUSCULAR | Status: AC
  Administered 2018-11-21: 0.5 mL via INTRAMUSCULAR
  Filled 2018-11-20: qty 0.5

## 2018-11-20 MED ORDER — SODIUM CHLORIDE 0.9 % IV SOLN: Freq: Three times a day (TID) | INTRAVENOUS | Status: DC | PRN

## 2018-11-20 MED ORDER — LIP MEDEX EX OINT
1.0000 "application " | TOPICAL_OINTMENT | Freq: Two times a day (BID) | CUTANEOUS | Status: DC
  Administered 2018-11-20 – 2018-11-24 (×9): 1 via TOPICAL
  Filled 2018-11-20: qty 7

## 2018-11-20 MED ORDER — FENTANYL CITRATE (PF) 100 MCG/2ML IJ SOLN: 25.0000 ug | INTRAMUSCULAR | Status: DC | PRN

## 2018-11-20 MED ORDER — OXYCODONE HCL 5 MG/5ML PO SOLN: 5.0000 mg | Freq: Once | ORAL | Status: DC | PRN

## 2018-11-20 MED ORDER — VITAMIN D 25 MCG (1000 UNIT) PO TABS
2000.0000 [IU] | ORAL_TABLET | Freq: Every day | ORAL | Status: DC
  Administered 2018-11-21 – 2018-11-24 (×4): 2000 [IU] via ORAL
  Filled 2018-11-20 (×4): qty 2

## 2018-11-20 MED ORDER — BISACODYL 5 MG PO TBEC
20.0000 mg | DELAYED_RELEASE_TABLET | Freq: Once | ORAL | Status: DC
  Filled 2018-11-20: qty 4

## 2018-11-20 MED ORDER — GABAPENTIN 300 MG PO CAPS
300.0000 mg | ORAL_CAPSULE | ORAL | Status: AC
  Administered 2018-11-20: 300 mg via ORAL
  Filled 2018-11-20: qty 1

## 2018-11-20 MED ORDER — PROCHLORPERAZINE EDISYLATE 10 MG/2ML IJ SOLN: 5.0000 mg | Freq: Four times a day (QID) | INTRAMUSCULAR | Status: DC | PRN

## 2018-11-20 MED ORDER — FENTANYL CITRATE (PF) 250 MCG/5ML IJ SOLN
INTRAMUSCULAR | Status: AC
  Filled 2018-11-20: qty 5

## 2018-11-20 MED ORDER — SODIUM CHLORIDE (PF) 0.9 % IJ SOLN
INTRAMUSCULAR | Status: DC | PRN
  Administered 2018-11-20: 30 mL

## 2018-11-20 MED ORDER — PROPOFOL 10 MG/ML IV BOLUS
INTRAVENOUS | Status: DC | PRN
  Administered 2018-11-20: 120 mg via INTRAVENOUS

## 2018-11-20 MED ORDER — PROCHLORPERAZINE MALEATE 10 MG PO TABS: 10.0000 mg | ORAL_TABLET | Freq: Four times a day (QID) | ORAL | Status: DC | PRN

## 2018-11-20 MED ORDER — ALUM & MAG HYDROXIDE-SIMETH 200-200-20 MG/5ML PO SUSP: 30.0000 mL | Freq: Four times a day (QID) | ORAL | Status: DC | PRN

## 2018-11-20 MED ORDER — BUPIVACAINE LIPOSOME 1.3 % IJ SUSP
INTRAMUSCULAR | Status: DC | PRN
  Administered 2018-11-20: 20 mL

## 2018-11-20 MED ORDER — SODIUM CHLORIDE 0.9 % IV SOLN
500.0000 mg | Freq: Once | INTRAVENOUS | Status: AC
  Administered 2018-11-20: 500 mg via INTRAVENOUS
  Filled 2018-11-20: qty 10

## 2018-11-20 MED ORDER — LIDOCAINE 2% (20 MG/ML) 5 ML SYRINGE
INTRAMUSCULAR | Status: DC | PRN
  Administered 2018-11-20: 1 mg/kg/h via INTRAVENOUS

## 2018-11-20 MED ORDER — HYDROMORPHONE HCL 1 MG/ML IJ SOLN: 0.5000 mg | INTRAMUSCULAR | Status: DC | PRN

## 2018-11-20 MED ORDER — ONDANSETRON HCL 4 MG/2ML IJ SOLN
INTRAMUSCULAR | Status: AC
  Filled 2018-11-20: qty 2

## 2018-11-20 MED ORDER — ONDANSETRON HCL 4 MG/2ML IJ SOLN: 4.0000 mg | Freq: Once | INTRAMUSCULAR | Status: DC | PRN

## 2018-11-20 MED ORDER — ROCURONIUM BROMIDE 10 MG/ML (PF) SYRINGE
PREFILLED_SYRINGE | INTRAVENOUS | Status: AC
  Filled 2018-11-20: qty 10

## 2018-11-20 MED ORDER — SODIUM CHLORIDE 0.9 % IV SOLN
INTRAVENOUS | Status: DC | PRN
  Administered 2018-11-20: 50 ug/min via INTRAVENOUS

## 2018-11-20 MED ORDER — SUCCINYLCHOLINE CHLORIDE 200 MG/10ML IV SOSY
PREFILLED_SYRINGE | INTRAVENOUS | Status: AC
  Filled 2018-11-20: qty 10

## 2018-11-20 MED ORDER — DIPHENHYDRAMINE HCL 12.5 MG/5ML PO ELIX: 12.5000 mg | ORAL_SOLUTION | Freq: Four times a day (QID) | ORAL | Status: DC | PRN

## 2018-11-20 MED ORDER — ENSURE SURGERY PO LIQD
237.0000 mL | Freq: Two times a day (BID) | ORAL | Status: DC
  Administered 2018-11-20 – 2018-11-23 (×5): 237 mL via ORAL
  Filled 2018-11-20 (×9): qty 237

## 2018-11-20 MED ORDER — SODIUM CHLORIDE 0.9 % IV SOLN
25.0000 mg | Freq: Once | INTRAVENOUS | Status: AC
  Administered 2018-11-20: 25 mg via INTRAVENOUS
  Filled 2018-11-20: qty 0.5

## 2018-11-20 MED ORDER — SACCHAROMYCES BOULARDII 250 MG PO CAPS
250.0000 mg | ORAL_CAPSULE | Freq: Two times a day (BID) | ORAL | Status: DC
  Administered 2018-11-20 – 2018-11-24 (×8): 250 mg via ORAL
  Filled 2018-11-20 (×8): qty 1

## 2018-11-20 MED ORDER — FENTANYL CITRATE (PF) 250 MCG/5ML IJ SOLN
INTRAMUSCULAR | Status: DC | PRN
  Administered 2018-11-20 (×3): 50 ug via INTRAVENOUS
  Administered 2018-11-20: 100 ug via INTRAVENOUS

## 2018-11-20 MED ORDER — ACETAMINOPHEN 500 MG PO TABS
1000.0000 mg | ORAL_TABLET | ORAL | Status: AC
  Administered 2018-11-20: 1000 mg via ORAL
  Filled 2018-11-20: qty 2

## 2018-11-20 MED ORDER — METOPROLOL TARTRATE 5 MG/5ML IV SOLN: 5.0000 mg | Freq: Four times a day (QID) | INTRAVENOUS | Status: DC | PRN

## 2018-11-20 MED ORDER — POLYETHYLENE GLYCOL 3350 17 GM/SCOOP PO POWD: 1.0000 | Freq: Once | ORAL | Status: DC

## 2018-11-20 MED ORDER — ONDANSETRON HCL 4 MG/2ML IJ SOLN: 4.0000 mg | Freq: Four times a day (QID) | INTRAMUSCULAR | Status: DC | PRN

## 2018-11-20 MED ORDER — LACTATED RINGERS IR SOLN
Status: DC | PRN
  Administered 2018-11-20: 1000 mL

## 2018-11-20 MED ORDER — SODIUM CHLORIDE 0.9 % IV SOLN
2.0000 g | Freq: Two times a day (BID) | INTRAVENOUS | Status: AC
  Administered 2018-11-20: 2 g via INTRAVENOUS
  Filled 2018-11-20: qty 2

## 2018-11-20 MED ORDER — LACTATED RINGERS IV SOLN
INTRAVENOUS | Status: DC
  Administered 2018-11-20 (×2): via INTRAVENOUS

## 2018-11-20 MED ORDER — ADULT MULTIVITAMIN W/MINERALS CH
1.0000 | ORAL_TABLET | Freq: Every day | ORAL | Status: DC
  Administered 2018-11-21 – 2018-11-24 (×4): 1 via ORAL
  Filled 2018-11-20 (×4): qty 1

## 2018-11-20 MED ORDER — ONDANSETRON HCL 4 MG PO TABS: 4.0000 mg | ORAL_TABLET | Freq: Four times a day (QID) | ORAL | Status: DC | PRN

## 2018-11-20 MED ORDER — DEXAMETHASONE SODIUM PHOSPHATE 10 MG/ML IJ SOLN
INTRAMUSCULAR | Status: AC
  Filled 2018-11-20: qty 1

## 2018-11-20 MED ORDER — ALVIMOPAN 12 MG PO CAPS
12.0000 mg | ORAL_CAPSULE | ORAL | Status: AC
  Administered 2018-11-20: 12 mg via ORAL
  Filled 2018-11-20: qty 1

## 2018-11-20 MED ORDER — HYDRALAZINE HCL 20 MG/ML IJ SOLN: 10.0000 mg | INTRAMUSCULAR | Status: DC | PRN

## 2018-11-20 MED ORDER — ONDANSETRON HCL 4 MG/2ML IJ SOLN
INTRAMUSCULAR | Status: DC | PRN
  Administered 2018-11-20: 4 mg via INTRAVENOUS

## 2018-11-20 MED ORDER — ENOXAPARIN SODIUM 40 MG/0.4ML ~~LOC~~ SOLN
40.0000 mg | Freq: Once | SUBCUTANEOUS | Status: AC
  Administered 2018-11-20: 40 mg via SUBCUTANEOUS
  Filled 2018-11-20: qty 0.4

## 2018-11-20 MED ORDER — SODIUM CHLORIDE (PF) 0.9 % IJ SOLN
INTRAMUSCULAR | Status: AC
  Filled 2018-11-20: qty 50

## 2018-11-20 MED ORDER — METRONIDAZOLE 500 MG PO TABS: 1000.0000 mg | ORAL_TABLET | ORAL | Status: DC

## 2018-11-20 MED ORDER — SUGAMMADEX SODIUM 200 MG/2ML IV SOLN
INTRAVENOUS | Status: DC | PRN
  Administered 2018-11-20: 300 mg via INTRAVENOUS

## 2018-11-20 MED ORDER — SODIUM CHLORIDE 0.9 % IV SOLN
INTRAVENOUS | Status: DC | PRN
  Administered 2018-11-20: 250 mL via INTRAVENOUS

## 2018-11-20 MED ORDER — SODIUM CHLORIDE 0.9 % IV SOLN
2.0000 g | INTRAVENOUS | Status: AC
  Administered 2018-11-20: 2 g via INTRAVENOUS
  Filled 2018-11-20: qty 2

## 2018-11-20 MED ORDER — LIDOCAINE 2% (20 MG/ML) 5 ML SYRINGE
INTRAMUSCULAR | Status: DC | PRN
  Administered 2018-11-20: 80 mg via INTRAVENOUS

## 2018-11-20 MED ORDER — GABAPENTIN 100 MG PO CAPS
200.0000 mg | ORAL_CAPSULE | Freq: Three times a day (TID) | ORAL | Status: DC
  Administered 2018-11-20 – 2018-11-24 (×12): 200 mg via ORAL
  Filled 2018-11-20 (×12): qty 2

## 2018-11-20 MED ORDER — OXYCODONE HCL 5 MG PO TABS: 5.0000 mg | ORAL_TABLET | Freq: Once | ORAL | Status: DC | PRN

## 2018-11-20 MED ORDER — ROCURONIUM BROMIDE 10 MG/ML (PF) SYRINGE
PREFILLED_SYRINGE | INTRAVENOUS | Status: DC | PRN
  Administered 2018-11-20: 30 mg via INTRAVENOUS
  Administered 2018-11-20: 50 mg via INTRAVENOUS

## 2018-11-20 MED ORDER — TRAMADOL HCL 50 MG PO TABS
50.0000 mg | ORAL_TABLET | Freq: Four times a day (QID) | ORAL | Status: DC | PRN
  Administered 2018-11-22 – 2018-11-24 (×5): 50 mg via ORAL
  Filled 2018-11-20 (×5): qty 1

## 2018-11-20 MED ORDER — ZINC SULFATE 220 (50 ZN) MG PO CAPS
220.0000 mg | ORAL_CAPSULE | Freq: Every day | ORAL | Status: DC
  Administered 2018-11-21 – 2018-11-24 (×4): 220 mg via ORAL
  Filled 2018-11-20 (×5): qty 1

## 2018-11-20 MED ORDER — ACETAMINOPHEN 500 MG PO TABS
1000.0000 mg | ORAL_TABLET | Freq: Four times a day (QID) | ORAL | Status: DC
  Administered 2018-11-20 – 2018-11-23 (×10): 1000 mg via ORAL
  Filled 2018-11-20 (×10): qty 2

## 2018-11-20 MED ORDER — NEOMYCIN SULFATE 500 MG PO TABS: 1000.0000 mg | ORAL_TABLET | ORAL | Status: DC

## 2018-11-20 MED ORDER — ZINC GLUCONATE 50 MG PO TABS: 50.0000 mg | ORAL_TABLET | Freq: Every day | ORAL | Status: DC

## 2018-11-20 MED ORDER — LACTATED RINGERS IV SOLN
INTRAVENOUS | Status: DC
  Administered 2018-11-20: 23:00:00 via INTRAVENOUS

## 2018-11-20 MED ORDER — DIPHENHYDRAMINE HCL 50 MG/ML IJ SOLN: 12.5000 mg | Freq: Four times a day (QID) | INTRAMUSCULAR | Status: DC | PRN

## 2018-11-20 MED ORDER — SUCCINYLCHOLINE CHLORIDE 200 MG/10ML IV SOSY
PREFILLED_SYRINGE | INTRAVENOUS | Status: DC | PRN
  Administered 2018-11-20: 100 mg via INTRAVENOUS

## 2018-11-20 MED ORDER — ALVIMOPAN 12 MG PO CAPS
12.0000 mg | ORAL_CAPSULE | Freq: Two times a day (BID) | ORAL | Status: DC
  Administered 2018-11-21: 10:00:00 12 mg via ORAL
  Filled 2018-11-20 (×3): qty 1

## 2018-11-20 MED ORDER — PROPOFOL 10 MG/ML IV BOLUS
INTRAVENOUS | Status: AC
  Filled 2018-11-20: qty 20

## 2018-11-20 MED ORDER — 0.9 % SODIUM CHLORIDE (POUR BTL) OPTIME
TOPICAL | Status: DC | PRN
  Administered 2018-11-20: 2000 mL

## 2018-11-20 MED ORDER — PHENYLEPHRINE HCL-NACL 10-0.9 MG/250ML-% IV SOLN
INTRAVENOUS | Status: AC
  Filled 2018-11-20: qty 250

## 2018-11-20 MED ORDER — LIDOCAINE HCL 2 % IJ SOLN
INTRAMUSCULAR | Status: AC
  Filled 2018-11-20: qty 20

## 2018-11-20 SURGICAL SUPPLY — 100 items
APL PRP STRL LF DISP 70% ISPRP (MISCELLANEOUS) ×1
APPLIER CLIP 5 13 M/L LIGAMAX5 (MISCELLANEOUS)
APPLIER CLIP ROT 10 11.4 M/L (STAPLE)
APR CLP MED LRG 11.4X10 (STAPLE)
APR CLP MED LRG 5 ANG JAW (MISCELLANEOUS)
BLADE EXTENDED COATED 6.5IN (ELECTRODE) ×2 IMPLANT
CANNULA REDUC XI 12-8 STAPL (CANNULA) ×1
CANNULA REDUCER 12-8 DVNC XI (CANNULA) ×1 IMPLANT
CELLS DAT CNTRL 66122 CELL SVR (MISCELLANEOUS) IMPLANT
CHLORAPREP W/TINT 26 (MISCELLANEOUS) ×2 IMPLANT
CLIP APPLIE 5 13 M/L LIGAMAX5 (MISCELLANEOUS) IMPLANT
CLIP APPLIE ROT 10 11.4 M/L (STAPLE) IMPLANT
CLIP VESOLOCK LG 6/CT PURPLE (CLIP) IMPLANT
CLIP VESOLOCK MED LG 6/CT (CLIP) IMPLANT
COVER SURGICAL LIGHT HANDLE (MISCELLANEOUS) ×4 IMPLANT
COVER TIP SHEARS 8 DVNC (MISCELLANEOUS) ×1 IMPLANT
COVER TIP SHEARS 8MM DA VINCI (MISCELLANEOUS) ×1
COVER WAND RF STERILE (DRAPES) IMPLANT
DECANTER SPIKE VIAL GLASS SM (MISCELLANEOUS) ×2 IMPLANT
DEVICE TROCAR PUNCTURE CLOSURE (ENDOMECHANICALS) IMPLANT
DRAIN CHANNEL 19F RND (DRAIN) ×2 IMPLANT
DRAPE ARM DVNC X/XI (DISPOSABLE) ×4 IMPLANT
DRAPE COLUMN DVNC XI (DISPOSABLE) ×1 IMPLANT
DRAPE DA VINCI XI ARM (DISPOSABLE) ×4
DRAPE DA VINCI XI COLUMN (DISPOSABLE) ×1
DRAPE SURG IRRIG POUCH 19X23 (DRAPES) ×2 IMPLANT
DRSG OPSITE POSTOP 4X10 (GAUZE/BANDAGES/DRESSINGS) IMPLANT
DRSG OPSITE POSTOP 4X6 (GAUZE/BANDAGES/DRESSINGS) ×2 IMPLANT
DRSG OPSITE POSTOP 4X8 (GAUZE/BANDAGES/DRESSINGS) IMPLANT
DRSG TEGADERM 2-3/8X2-3/4 SM (GAUZE/BANDAGES/DRESSINGS) ×2 IMPLANT
DRSG TEGADERM 4X4.75 (GAUZE/BANDAGES/DRESSINGS) ×2 IMPLANT
ELECT PENCIL ROCKER SW 15FT (MISCELLANEOUS) ×2 IMPLANT
ELECT REM PT RETURN 15FT ADLT (MISCELLANEOUS) ×2 IMPLANT
ENDOLOOP SUT PDS II  0 18 (SUTURE)
ENDOLOOP SUT PDS II 0 18 (SUTURE) IMPLANT
EVACUATOR SILICONE 100CC (DRAIN) ×2 IMPLANT
GAUZE SPONGE 2X2 8PLY STRL LF (GAUZE/BANDAGES/DRESSINGS) ×1 IMPLANT
GAUZE SPONGE 4X4 12PLY STRL (GAUZE/BANDAGES/DRESSINGS) IMPLANT
GLOVE ECLIPSE 8.0 STRL XLNG CF (GLOVE) ×10 IMPLANT
GLOVE INDICATOR 8.0 STRL GRN (GLOVE) ×10 IMPLANT
GOWN STRL REUS W/TWL XL LVL3 (GOWN DISPOSABLE) ×10 IMPLANT
GRASPER SUT TROCAR 14GX15 (MISCELLANEOUS) ×2 IMPLANT
HOLDER FOLEY CATH W/STRAP (MISCELLANEOUS) ×2 IMPLANT
IRRIG SUCT STRYKERFLOW 2 WTIP (MISCELLANEOUS)
IRRIGATION SUCT STRKRFLW 2 WTP (MISCELLANEOUS) IMPLANT
KIT PROCEDURE DA VINCI SI (MISCELLANEOUS)
KIT PROCEDURE DVNC SI (MISCELLANEOUS) IMPLANT
KIT TURNOVER KIT A (KITS) ×2 IMPLANT
NEEDLE INSUFFLATION 14GA 120MM (NEEDLE) ×2 IMPLANT
PACK CARDIOVASCULAR III (CUSTOM PROCEDURE TRAY) ×2 IMPLANT
PACK COLON (CUSTOM PROCEDURE TRAY) ×2 IMPLANT
PAD POSITIONING PINK XL (MISCELLANEOUS) ×2 IMPLANT
PORT LAP GEL ALEXIS MED 5-9CM (MISCELLANEOUS) ×2 IMPLANT
PROTECTOR NERVE ULNAR (MISCELLANEOUS) ×4 IMPLANT
RTRCTR WOUND ALEXIS 18CM MED (MISCELLANEOUS)
SCISSORS LAP 5X35 DISP (ENDOMECHANICALS) ×2 IMPLANT
SEAL CANN UNIV 5-8 DVNC XI (MISCELLANEOUS) ×4 IMPLANT
SEAL XI 5MM-8MM UNIVERSAL (MISCELLANEOUS) ×4
SEALER VESSEL DA VINCI XI (MISCELLANEOUS) ×1
SEALER VESSEL EXT DVNC XI (MISCELLANEOUS) ×1 IMPLANT
SLEEVE ADV FIXATION 5X100MM (TROCAR) IMPLANT
SOLUTION ELECTROLUBE (MISCELLANEOUS) ×2 IMPLANT
SPONGE GAUZE 2X2 STER 10/PKG (GAUZE/BANDAGES/DRESSINGS) ×1
STAPLER 45 BLU RELOAD XI (STAPLE) ×4 IMPLANT
STAPLER 45 BLUE RELOAD XI (STAPLE) ×4
STAPLER 45 GREEN RELOAD XI (STAPLE)
STAPLER 45 GRN RELOAD XI (STAPLE) IMPLANT
STAPLER CANNULA SEAL DVNC XI (STAPLE) ×1 IMPLANT
STAPLER CANNULA SEAL XI (STAPLE) ×1
STAPLER SHEATH (SHEATH) ×1
STAPLER SHEATH ENDOWRIST DVNC (SHEATH) ×1 IMPLANT
SUT MNCRL AB 4-0 PS2 18 (SUTURE) ×2 IMPLANT
SUT PDS AB 1 CT1 27 (SUTURE) ×4 IMPLANT
SUT PDS AB 1 TP1 96 (SUTURE) IMPLANT
SUT PROLENE 0 CT 2 (SUTURE) IMPLANT
SUT PROLENE 2 0 KS (SUTURE) IMPLANT
SUT PROLENE 2 0 SH DA (SUTURE) IMPLANT
SUT SILK 2 0 (SUTURE)
SUT SILK 2 0 SH CR/8 (SUTURE) IMPLANT
SUT SILK 2-0 18XBRD TIE 12 (SUTURE) IMPLANT
SUT SILK 3 0 (SUTURE) ×2
SUT SILK 3 0 SH CR/8 (SUTURE) ×2 IMPLANT
SUT SILK 3-0 18XBRD TIE 12 (SUTURE) ×1 IMPLANT
SUT V-LOC BARB 180 2/0GR6 GS22 (SUTURE)
SUT VIC AB 3-0 SH 18 (SUTURE) IMPLANT
SUT VIC AB 3-0 SH 27 (SUTURE)
SUT VIC AB 3-0 SH 27XBRD (SUTURE) IMPLANT
SUT VICRYL 0 UR6 27IN ABS (SUTURE) ×2 IMPLANT
SUT VLOC BARB 180 ABS3/0GR12 (SUTURE) ×4
SUTURE V-LC BRB 180 2/0GR6GS22 (SUTURE) IMPLANT
SUTURE VLOC BRB 180 ABS3/0GR12 (SUTURE) ×2 IMPLANT
SYR 10ML ECCENTRIC (SYRINGE) ×2 IMPLANT
SYS LAPSCP GELPORT 120MM (MISCELLANEOUS)
SYSTEM LAPSCP GELPORT 120MM (MISCELLANEOUS) IMPLANT
TAPE UMBILICAL COTTON 1/8X30 (MISCELLANEOUS) ×2 IMPLANT
TOWEL OR NON WOVEN STRL DISP B (DISPOSABLE) ×2 IMPLANT
TRAY FOLEY MTR SLVR 16FR STAT (SET/KITS/TRAYS/PACK) ×2 IMPLANT
TROCAR ADV FIXATION 5X100MM (TROCAR) ×2 IMPLANT
TUBING CONNECTING 10 (TUBING) ×4 IMPLANT
TUBING INSUFFLATION 10FT LAP (TUBING) ×2 IMPLANT

## 2018-11-20 NOTE — Op Note (Signed)
11/20/2018  11:52 AM  PATIENT:  Oscar Sparks  78 y.o. male  Patient Care Team: Laurey Morale, MD as PCP - Huston Foley, MD as Consulting Physician (General Surgery) Ronald Lobo, MD as Consulting Physician (Gastroenterology)  PRE-OPERATIVE DIAGNOSIS:  COLON CANCER  POST-OPERATIVE DIAGNOSIS:  ASCENDING COLON CANCER  PROCEDURE:   XI ROBOT ASSISTED PROXIMAL COLECTOMY OMENTOPEXY TAP BLOCK - BILATERAL  SURGEON:  [name], MD  ASSISTANT: Carlena Hurl, PA-C An experienced assistant was required given the standard of surgical care given the complexity of the case.  This assistant was needed for exposure, dissection, suctioning, retraction, instrument exchange, etc.  ANESTHESIA:   local and general  Nerve block provided with liposomal bupivacaine (Experel) mixed with 0.25% bupivacaine as a Bilateral TAP block x 27mL each side at the level of the transverse abdominis & preperitoneal spaces along the flank at the anterior axillary line, from subcostal ridge to iliac crest under laparoscopic guidance    EBL:  Total I/O In: 1600 [I.V.:1500; IV Piggyback:100] Out: 450 [Urine:350; Blood:100]  Delay start of Pharmacological VTE agent (>24hrs) due to surgical blood loss or risk of bleeding:  no  DRAINS: none   SPECIMEN:  PROXIMAL "RIGHT" COLON  DISPOSITION OF SPECIMEN:  PATHOLOGY  COUNTS:  YES  PLAN OF CARE: Admit to inpatient   PATIENT DISPOSITION:  PACU - hemodynamically stable.  INDICATION:    Pleasant elderly gentleman with bulky tumor in ascending colon by CT scan exam and colonoscopy.  I recommended segmental resection:  The anatomy & physiology of the digestive tract was discussed.  The pathophysiology was discussed.  Natural history risks without surgery was discussed.   I worked to give an overview of the disease and the frequent need to have multispecialty involvement.  I feel the risks of no intervention will lead to serious problems that outweigh the  operative risks; therefore, I recommended a partial colectomy to remove the pathology.  Laparoscopic & open techniques were discussed.   Risks such as bleeding, infection, abscess, leak, reoperation, possible ostomy, hernia, heart attack, death, and other risks were discussed.  I noted a good likelihood this will help address the problem.   Goals of post-operative recovery were discussed as well.  We will work to minimize complications.  An educational handout on the pathology was given as well.  Questions were answered.    The patient expresses understanding & wishes to proceed with surgery.  OR FINDINGS:   Patient had very bulky ascending colon tumor near obstructing.  Obviously enlarged but smooth ellipsoid lymph nodes going along the ileocolic pedicle.  Uncertain if metastatic versus inflammatory.  No obvious metastatic disease on visceral parietal peritoneum or liver.  It is an ileocolonic isoperistaltic side-to-side anastomosis that rests in the epigastric region.  DESCRIPTION:   Informed consent was confirmed.  The patient underwent general anaesthesia without difficulty.  The patient was positioned with arms tucked & secured appropriately.  VTE prevention in place.  The patient's abdomen was clipped, prepped, & draped in a sterile fashion.  Surgical timeout confirmed our plan.  The patient was positioned in reverse Trendelenburg.  Abdominal entry was gained using Varess technique at the left subcostal ridge on the anterior abdominal wall.  No elevated EtCO2 noted.  Port placed.  Camera inspection revealed no injury.  Extra ports were carefully placed under direct laparoscopic visualization.  We docked the Inituitive Vinci robot carefully and placed intstruments under visualization  I mobilized & reflected the greater omentum and small  bowel in the upper abdomen.  Could easily isolate the very bulky tumor involving almost his entire ascending colon.  I was able to elevate the proximal colon  to isolate the ileocolonic pedicle.  I scored the ileal mesentery just proximal to that.   I carried that further dissection in a medial to lateral fashion.  I was able to bluntly get into the retro-mesenteric plane on the right side.  I freed the proximal right sided colonic mesentery off the retroperitoneum including the duodenal sweep, pancreatic head, & Gerota's fascia of the right kidney.  I was able to see and protect the ureter and gonadal's in the retroperitoneal position.  I ended up transecting a fair amount of the anterior Gerota's fascia off the right kidney since it was a bulky tumor and seemed to have a rather inflamed plane in between it.  Did not want to risk a positive deep margin on the right retroperitoneum/Gerota's fascia/kidney.  I was able to get underneath the hepatic flexure.  I was able to get underneath the proximal and mid transverse colon.  I isolated the proximal ileocecal pedicle.  I skeletonized it & transected the vessels.    I then proceeded to mobilize the terminal ileum & proximal "right" colon in a lateral to medial fashion.  I mobilized the distal ileal mesentery off its retroperitoneal and pelvic attachments.  I mobilized the ascending colon off It is side wall attachments to the paracolic gutter and retroperitoneum.  I also mobilized the greater omentum off the mid transverse colon and mobilized the mid to proximal transverse colon in a superior to inferior fashion.  This allowed me to mobilize the hepatic flexure and get a complete mobilization of the proximal "right" colon to the mid-transverse colon.  I elevated the mid transverse colon and transected the mesentery radially of the colon going over the pancreatic head and duodenal bulb and pylorus.  Took care to skeletonize and transect vessels.  Patient had an obvious right colic pedicle that I did a high ligation on as well.  I could isolate the pathology. When he went ahead and proceeded with transection.  I transected  the distal ileal mesentery and then transected at the distal ileum with a robotic stapler.  I then transected transverse colon mesentery just proximal to a dominant middle colic arterial pedicle radially.  Transected at the proximal transverse colon with a robotic stapler.  We assured hemostasis.   I did a side-to-side stapled anastomosis of ileum to mid-transverse colon using a 40mm robotic stapler x 2 firings in an isoperistaltic fashion.  (Distal stump of ileum to mid transverse colon for the distal end of the anastomosis.  Proximal end of colon stump to more proximal ileum for the proximal end of the anastomosis).  I sewed the common staple channel wound with an absorbable suture ( 2-0 V-lock) in a running [place] fashion from each corner and meeting in the center.  I did meticulous inspection prove an airtight closure.  I protected the anastomosis line with an anterior omentopexy of greater omentum using V lock suture.  We did reinspection of the abdomen.  Hemostasis was good.   Ureters, retroperitoneum, and bowel uninjured.  The anastomosis looked healthy.   We did a final irrigation of antibiotic solution (900 mg clindamycin/240 mg gentamicin in a liter of crystalloid) & held that while we placed the wound protector through the 62mm port site after it was enlarged in a Pfannenstiel fashion.  Specimen removed without incident.  Ports & wound protector removed.  We changed gloves & redraped the patient per colon SSI prevention protocol.  We aspirated the antibiotic irrigation.  Hemostasis was good.  Sterile unused instruments were used from this point.  I closed the skin at the port sites using Monocryl stitch and sterile dressing.  I closed the extraction wound using a 0 Vicryl vertical peritoneal closure and a #1 PDS transverse anterior rectal fascial closure like a small Pfannenstiel closure. I closed the skin with Monocryl stitches.  I placed sterile dressings.     Patient is being extubated go to  recovery room. I discussed postop care with the patient in detail the office & in the holding area. Instructions are written.I discussed operative findings, updated the patient's status, discussed probable steps to recovery, and gave postoperative recommendations to the patient's spouse.  Recommendations were made.  Questions were answered.  She expressed understanding & appreciation.   [name], M.D., F.A.C.S. Gastrointestinal and Minimally Invasive Surgery [facility] [address] [place], [place] [address] [phone] Main / Paging

## 2018-11-20 NOTE — Transfer of Care (Signed)
Immediate Anesthesia Transfer of Care Note  Patient: Oscar Sparks  Procedure(s) Performed: XI ROBOT ASSISTED PROXIMAL COLECTOMY (N/A Abdomen)  Patient Location: PACU  Anesthesia Type:General  Level of Consciousness: awake, alert , oriented and patient cooperative  Airway & Oxygen Therapy: Patient Spontanous Breathing and Patient connected to face mask oxygen  Post-op Assessment: Report given to RN, Post -op Vital signs reviewed and stable and Patient moving all extremities  Post vital signs: Reviewed and stable  Last Vitals:  Vitals Value Taken Time  BP 155/88 11/20/18 1148  Temp    Pulse 71 11/20/18 1151  Resp 7 11/20/18 1151  SpO2 100 % 11/20/18 1151  Vitals shown include unvalidated device data.  Last Pain:  Vitals:   11/20/18 0659  TempSrc: Oral         Complications: No apparent anesthesia complications

## 2018-11-20 NOTE — Interval H&P Note (Signed)
History and Physical Interval Note:  11/20/2018 7:08 AM  Oscar Sparks  has presented today for surgery, with the diagnosis of COLON CANCER.  The various methods of treatment have been discussed with the patient and family. After consideration of risks, benefits and other options for treatment, the patient has consented to  Procedure(s): XI [place] (N/A) as a surgical intervention.  The patient's history has been reviewed, patient examined, no change in status, stable for surgery.  I have reviewed the patient's chart and labs.  Questions were answered to the patient's satisfaction.    I have re-reviewed the the patient's records, history, medications, and allergies.  I have re-examined the patient.  I again discussed intraoperative plans and goals of post-operative recovery.  The patient agrees to proceed.  Oscar Sparks  [birth year] [id number]  Patient Care Team: Laurey Morale, MD as PCP - Huston Foley, MD as Consulting Physician (General Surgery) Ronald Lobo, MD as Consulting Physician (Gastroenterology)  Patient Active Problem List   Diagnosis Date Noted  . Cancer of ascending colon ([place]) 09/30/2018    Priority: High  . 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 Left hip pain 06/25/2014  . 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 Prostate cancer ([place]) 11/15/2009  . 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 CONTACT DERMATITIS 09/04/2007    Past Medical History:  Diagnosis Date  . Arthritis    knees  . Colon cancer ([place]) 09/24/2018  . ED (erectile dysfunction)   . 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 Peripheral vascular disease ([place])    poor curculation in hands and feet hard to monitor with pulse oximetry  . Prostate cancer [facility])    sees Dr. Gaynelle Arabian, received cesium seeds     Past Surgical History:   Procedure Laterality Date  . BACK SURGERY    . SPINE SURGERY  2004   Dr. Sherwood Gambler  . TONSILLECTOMY      Social History   Socioeconomic History  . Marital status: Married    Spouse name: Not on file  . Number of children: Not on file  . Years of education: Not on file  . Highest education level: Not on file  Occupational History  . Not on file  Social Needs  . Financial resource strain: Not on file  . Food insecurity    Worry: Not on file    Inability: Not on file  . Transportation needs    Medical: Not on file    Non-medical: Not on file  Tobacco Use  . Smoking status: Never Smoker  . Smokeless tobacco: Never Used  Substance and Sexual Activity  . Alcohol use: Yes    Alcohol/week: 1.0 standard drinks    Types: 1 Glasses of wine per week  . Drug use: No  . Sexual activity: Not on file  Lifestyle  . Physical activity    Days per week: Not on file    Minutes per session: Not on file  . Stress: Not on file  Relationships  . Social Herbalist on phone: Not on file    Gets together: Not on file    Attends religious service: Not on file    Active member of club or organization: Not on file    Attends meetings of clubs or organizations: Not on file    Relationship status: Not  on file  . Intimate partner violence    Fear of current or ex partner: Not on file    Emotionally abused: Not on file    Physically abused: Not on file    Forced sexual activity: Not on file  Other Topics Concern  . Not on file  Social History Narrative  . Not on file    Family History  Problem Relation Age of Onset  . Breast cancer Other     Medications Prior to Admission  Medication Sig Dispense Refill Last Dose  . Ascorbic Acid (VITAMIN C) 1000 MG tablet Take 1,000 mg by mouth daily.     . B Complex-C (SUPER B COMPLEX PO) Take 1 tablet by mouth daily.      . Cholecalciferol (VITAMIN D) 50 MCG (2000 UT) CAPS Take 2,000 Units by mouth daily.     Marland Kitchen ibuprofen (ADVIL) 200 MG  tablet Take 400 mg by mouth 3 (three) times daily as needed for moderate pain.     . Multiple Vitamins-Minerals (MULTIVITAMIN WITH MINERALS) tablet Take 1 tablet by mouth daily.     . Omega-3 Fatty Acids (FISH OIL PO) Take 2,000 mg by mouth daily.      . Turmeric Curcumin 500 MG CAPS Take 500 mg by mouth daily.     . vitamin E 400 UNIT capsule Take 400 Units by mouth daily.     Marland Kitchen zinc gluconate 50 MG tablet Take 50 mg by mouth daily.       Current Facility-Administered Medications  Medication Dose Route Frequency Provider Last Rate Last Dose  . acetaminophen (TYLENOL) tablet 1,000 mg  1,000 mg Oral On Call to OR Michael Boston, MD      . alvimopan (ENTEREG) capsule 12 mg  12 mg Oral On Call to OR Michael Boston, MD      . bisacodyl (DULCOLAX) EC tablet 20 mg  20 mg Oral Once Michael Boston, MD      . bupivacaine liposome (EXPAREL) 1.3 % injection 266 mg  20 mL Infiltration Once Michael Boston, MD      . cefoTEtan (CEFOTAN) 2 g in sodium chloride 0.9 % 100 mL IVPB  2 g Intravenous On Call to OR Michael Boston, MD      . clindamycin (CLEOCIN) 900 mg, gentamicin (GARAMYCIN) 240 mg in sodium chloride 0.9 % 1,000 mL for intraperitoneal lavage   Irrigation To OR Michael Boston, MD      . enoxaparin (LOVENOX) injection 40 mg  40 mg Subcutaneous Once Michael Boston, MD      . gabapentin (NEURONTIN) capsule 300 mg  300 mg Oral On Call to OR Michael Boston, MD      . lactated ringers infusion   Intravenous Continuous Lillia Abed, MD         Allergies  Allergen Reactions  . Poison Ivy Extract Itching and Rash    BP 111/67   Pulse 68   Temp 97.9 F (36.6 C) (Oral)   Resp 16   SpO2 93%   Labs: Results for orders placed or performed during the hospital encounter of 11/18/18 (from the past 48 hour(s))  Type and screen All Cardiac and thoracic surgeries, spinal fusions, myomectomies, craniotomies, colon & liver resections, total joint revisions, same day c-section with placenta previa or accreta.      Status: None   Collection Time: 11/18/18  2:20 PM  Result Value Ref Range   ABO/RH(D) O POS    Antibody Screen NEG    Sample  Expiration 12/02/2018,2359    Extend sample reason      NO TRANSFUSIONS OR PREGNANCY IN THE PAST 3 MONTHS Performed at [facility] [address], [place], [place] [address]   Hemoglobin A1c     Status: None   Collection Time: 11/18/18  2:21 PM  Result Value Ref Range   Hgb A1c MFr Bld 4.9 4.8 - 5.6 %    Comment: (NOTE) Pre diabetes:          5.7%-6.4% Diabetes:              >6.4% Glycemic control for   <7.0% adults with diabetes    Mean Plasma Glucose 93.93 mg/dL    Comment: Performed at [facility] [address], [place], [place] [id number]  Basic metabolic panel     Status: Abnormal   Collection Time: 11/18/18  2:21 PM  Result Value Ref Range   Sodium 142 135 - 145 mmol/L   Potassium 4.4 3.5 - 5.1 mmol/L   Chloride 106 98 - 111 mmol/L   CO2 28 22 - 32 mmol/L   Glucose, Bld 99 70 - 99 mg/dL   BUN 28 (H) 8 - 23 mg/dL   Creatinine, Ser 0.85 0.61 - 1.24 mg/dL   Calcium 9.2 8.9 - 10.3 mg/dL   GFR calc non Af Amer >60 >60 mL/min   GFR calc Af Amer >60 >60 mL/min   Anion gap 8 5 - 15    Comment: Performed at [facility], [place] [address] [place], [place] [address]  CBC     Status: Abnormal   Collection Time: 11/18/18  2:21 PM  Result Value Ref Range   WBC 10.8 (H) 4.0 - 10.5 K/uL   RBC 3.31 (L) 4.22 - 5.81 MIL/uL   Hemoglobin 8.1 (L) 13.0 - 17.0 g/dL   HCT 28.6 (L) 39.0 - 52.0 %   MCV 86.4 80.0 - 100.0 fL   MCH 24.5 (L) 26.0 - 34.0 pg   MCHC 28.3 (L) 30.0 - 36.0 g/dL   RDW 15.9 (H) 11.5 - 15.5 %   Platelets 484 (H) 150 - 400 K/uL   nRBC 0.0 0.0 - 0.2 %    Comment: Performed at [facility], [place] [address], [place], [place] [address]    Imaging / Studies: Ct Chest W Contrast  Result Date: 11/08/2018 CLINICAL DATA:  History of colon cancer.  Surgery in 2 weeks. EXAM: CT CHEST  WITH CONTRAST TECHNIQUE: Multidetector CT imaging of the chest was performed during intravenous contrast administration. CONTRAST:  21mL ISOVUE-300 IOPAMIDOL (ISOVUE-300) INJECTION 61% COMPARISON:  None. FINDINGS: Cardiovascular: The ascending thoracic aorta demonstrates no aneurysm. There are scattered mild to moderate atherosclerotic changes. No dissection. The central pulmonary arteries are normal in caliber. Coronary artery calcifications affect right and left coronary arteries. Mediastinum/Nodes: No enlarged mediastinal, hilar, or axillary lymph nodes. Thyroid gland, trachea, and esophagus demonstrate no significant findings. Lungs/Pleura: Lungs are clear. No pleural effusion or pneumothorax. Upper Abdomen: There are probable cysts in the liver with the largest seen on series 2, image 149, unchanged too small to completely characterize. The gallbladder is normal. Visualized portions of the portal vein are normal. The patient's known mass in the ascending colon is only partially imaged on this study. See the CT scan of the abdomen for further characterization of the upper abdomen. Musculoskeletal: No bony metastatic disease identified. IMPRESSION: 1. No metastatic disease identified in the chest. 2. Atherosclerotic changes in the nonaneurysmal  aorta. Coronary artery calcifications. 3. The patient's known ascending colon mass is only partially characterized on this study. See the previous CT of the abdomen from September 17, 2018 for further characterization. Aortic Atherosclerosis (ICD10-I70.0). Electronically Signed   By: Dorise Bullion III M.D   On: 11/08/2018 15:56     .[name], M.D., F.A.C.S. Gastrointestinal and Minimally Invasive Surgery [facility] [address] [place], [place] [address] [phone] Main / Paging  11/20/2018 7:08 AM    [name]

## 2018-11-20 NOTE — Anesthesia Procedure Notes (Signed)
Procedure Name: Intubation Date/Time: 11/20/2018 8:36 AM Performed by: Mitzie Na, CRNA Pre-anesthesia Checklist: Patient identified, Emergency Drugs available, Suction available and Patient being monitored Patient Re-evaluated:Patient Re-evaluated prior to induction Oxygen Delivery Method: Circle system utilized Preoxygenation: Pre-oxygenation with 100% oxygen Induction Type: IV induction and Rapid sequence Laryngoscope Size: Mac and 3 Grade View: Grade I Tube type: Oral Tube size: 7.0 mm Number of attempts: 1 Airway Equipment and Method: Stylet and Oral airway Placement Confirmation: ETT inserted through vocal cords under direct vision,  positive ETCO2 and breath sounds checked- equal and bilateral Secured at: 23 cm Tube secured with: Tape Dental Injury: Teeth and Oropharynx as per pre-operative assessment

## 2018-11-20 NOTE — Progress Notes (Signed)
Pt denies any pain, tolerating clear liquids with no nausea or vomiting.  He is having incontinent episodes of brown liquid stools.

## 2018-11-20 NOTE — Anesthesia Postprocedure Evaluation (Signed)
Anesthesia Post Note  Patient: Oscar Sparks  Procedure(s) Performed: XI ROBOT ASSISTED PROXIMAL COLECTOMY (N/A Abdomen)     Patient location during evaluation: PACU Anesthesia Type: General Level of consciousness: awake and alert Pain management: pain level controlled Vital Signs Assessment: post-procedure vital signs reviewed and stable Respiratory status: spontaneous breathing, nonlabored ventilation, respiratory function stable and patient connected to nasal cannula oxygen Cardiovascular status: blood pressure returned to baseline and stable Postop Assessment: no apparent nausea or vomiting Anesthetic complications: no    Last Vitals:  Vitals:   11/20/18 1245 11/20/18 1319  BP: 131/81 138/75  Pulse: 72 68  Resp: 13 12  Temp: (!) 36.4 C 36.5 C  SpO2: 100% 100%    Last Pain:  Vitals:   11/20/18 1319  TempSrc: Oral  PainSc:                  Audry Pili

## 2018-11-20 NOTE — H&P (Signed)
Oscar Sparks DOB: Feb 17, 1941 Married / Language: English / Race: White Male  Patient Care Team: Laurey Morale, MD as PCP - Huston Foley, MD as Consulting Physician (General Surgery) Ronald Lobo, MD as Consulting Physician (Gastroenterology)  ` ` Patient sent for surgical consultation at the request of Dr Cristina Gong  Chief Complaint: Partially obstructing mass of cecum ` ` The patient is an active male who has noticed a lump in the right side of his abdomen. Had some crampy abdominal pain. Concern for possible bowel obstruction symptoms. Saw primary care physician. CAT scan revealed a bulky mass in the right colon. Noted is possibly cecum although clearly has dilated colon proximal to it. He improved on his own nonoperatively. Sent for urgent colonoscopy. Dr. Cristina Gong removed as a few small adenomas in his rectum. Encountered a bulky mass in his proximal colon very suspicious for adenocarcinoma. Biopsies done. Pathology revealed results not back yet as of this morning. However suspicious for cancer. Surgical consultation requested.  The patient is not a smoker. Not diabetic. He walks a mile a day without difficulty. He's never had any abdominal surgery. Usually moves his bowels every morning. He has had colonoscopies in the past. His last one was over 5 years ago. He knows it was in town but not recall who did it. No personal nor family history of GI/colon cancer, inflammatory bowel disease, irritable bowel syndrome, allergy such as Celiac Sprue, dietary/dairy problems, colitis, ulcers nor gastritis. No recent sick contacts/gastroenteritis. No travel outside the country. No changes in diet. No dysphagia to solids or liquids. No significant heartburn or reflux. No hematochezia, hematemesis, coffee ground emesis. No evidence of prior gastric/peptic ulceration. Mother had breast cancer possible bone metastases. Father had cardiac issues. The patient  cells never had any heart issues. No exertional chest pain or shortness of breath. He is married. He is here today by himself.  (Review of systems as stated in this history (HPI) or in the review of systems. Otherwise all other 12 point ROS are negative) ` ` `   Past Surgical History Emeline Gins, [place]; 09/30/2018 9:31 AM) No pertinent past surgical history  Diagnostic Studies History Emeline Gins, [place]; 09/30/2018 9:31 AM) Colonoscopy within last year  Allergies Emeline Gins, CMA; 09/30/2018 9:32 AM) No Known Drug Allergies [09/30/2018]: Allergies Reconciled  Medication History Emeline Gins, CMA; 09/30/2018 9:35 AM) Iron (65MG  Tablet, Oral) Active. Omega 3 (1000MG  Capsule, Oral) Active. Turmeric Curcumin (500MG  Capsule, Oral) Active. Vitamin B Complex (Oral) Active. Vitamin C (500MG  Tablet, Oral) Active. Vitamin D3 (250 MCG(10000 UT) Capsule, Oral) Active. Vitamin E (400UNIT Tablet, Oral) Active. Zinc (50MG  Tablet, Oral) Active. Medications Reconciled  Social History Emeline Gins, [place]; 09/30/2018 9:31 AM) Alcohol use Occasional alcohol use. Caffeine use Coffee. No drug use Tobacco use Never smoker.  Family History Emeline Gins, [place]; 09/30/2018 9:31 AM) Breast Cancer Mother. Heart Disease Father.  Other Problems Emeline Gins, [place]; 09/30/2018 9:31 AM) Colon Cancer     Review of Systems Emeline Gins CMA; 09/30/2018 9:31 AM) General Not Present- Appetite Loss, Chills, Fatigue, Fever, Night Sweats, Weight Gain and Weight Loss. Skin Not Present- Change in Wart/Mole, Dryness, Hives, Jaundice, New Lesions, Non-Healing Wounds, Rash and Ulcer. HEENT Not Present- Earache, Hearing Loss, Hoarseness, Nose Bleed, Oral Ulcers, Ringing in the Ears, Seasonal Allergies, Sinus Pain, Sore Throat, Visual Disturbances, Wears glasses/contact lenses and Yellow Eyes. Respiratory Not Present- Bloody sputum, Chronic Cough, Difficulty Breathing,  Snoring and Wheezing. Breast Not Present- Breast Mass, Breast Pain, Nipple Discharge  and Skin Changes. Cardiovascular Not Present- Chest Pain, Difficulty Breathing Lying Down, Leg Cramps, Palpitations, Rapid Heart Rate, Shortness of Breath and Swelling of Extremities. Gastrointestinal Not Present- Abdominal Pain, Bloating, Bloody Stool, Change in Bowel Habits, Chronic diarrhea, Constipation, Difficulty Swallowing, Excessive gas, Gets full quickly at meals, Hemorrhoids, Indigestion, Nausea, Rectal Pain and Vomiting. Male Genitourinary Not Present- Blood in Urine, Change in Urinary Stream, Frequency, Impotence, Nocturia, Painful Urination, Urgency and Urine Leakage. Musculoskeletal Not Present- Back Pain, Joint Pain, Joint Stiffness, Muscle Pain, Muscle Weakness and Swelling of Extremities. Neurological Not Present- Decreased Memory, Fainting, Headaches, Numbness, Seizures, Tingling, Tremor, Trouble walking and Weakness. Psychiatric Not Present- Anxiety, Bipolar, Change in Sleep Pattern, Depression, Fearful and Frequent crying. Endocrine Not Present- Cold Intolerance, Excessive Hunger, Hair Changes, Heat Intolerance, Hot flashes and New Diabetes. Hematology Not Present- Blood Thinners, Easy Bruising, Excessive bleeding, Gland problems, HIV and Persistent Infections.  Vitals Emeline Gins CMA; 09/30/2018 9:32 AM) 09/30/2018 9:31 AM Weight: 137.4 lb Height: 71in Body Surface Area: 1.8 m Body Mass Index: 19.16 kg/m  Temp.: 98.68F  Pulse: 84 (Regular)  BP: 124/76 (Sitting, Left Arm, Standard)   There were no vitals taken for this visit.      Physical Exam [name] MD; 09/30/2018 9:58 AM)  General Mental Status-Alert. General Appearance-Not in acute distress, Not Sickly. Orientation-Oriented X3. Hydration-Well hydrated. Voice-Normal. Note: Thin but active. Bright and alert. Asks appropriate questions.  Integumentary Global Assessment Upon  inspection and palpation of skin surfaces of the - Axillae: non-tender, no inflammation or ulceration, no drainage. and Distribution of scalp and body hair is normal. General Characteristics Temperature - normal warmth is noted.  Head and Neck Head-normocephalic, atraumatic with no lesions or palpable masses. Face Global Assessment - atraumatic, no absence of expression. Neck Global Assessment - no abnormal movements, no bruit auscultated on the right, no bruit auscultated on the left, no decreased range of motion, non-tender. Trachea-midline. Thyroid Gland Characteristics - non-tender.  Eye Eyeball - Left-Extraocular movements intact, No Nystagmus - Left. Eyeball - Right-Extraocular movements intact, No Nystagmus - Right. Cornea - Left-No Hazy - Left. Cornea - Right-No Hazy - Right. Sclera/Conjunctiva - Left-No scleral icterus, No Discharge - Left. Sclera/Conjunctiva - Right-No scleral icterus, No Discharge - Right. Pupil - Left-Direct reaction to light normal. Pupil - Right-Direct reaction to light normal.  ENMT Ears Pinna - Left - no drainage observed, no generalized tenderness observed. Pinna - Right - no drainage observed, no generalized tenderness observed. Nose and Sinuses External Inspection of the Nose - no destructive lesion observed. Inspection of the nares - Left - quiet respiration. Inspection of the nares - Right - quiet respiration. Mouth and Throat Lips - Upper Lip - no fissures observed, no pallor noted. Lower Lip - no fissures observed, no pallor noted. Nasopharynx - no discharge present. Oral Cavity/Oropharynx - Tongue - no dryness observed. Oral Mucosa - no cyanosis observed. Hypopharynx - no evidence of airway distress observed.  Chest and Lung Exam Inspection Movements - Normal and Symmetrical. Accessory muscles - No use of accessory muscles in breathing. Palpation Palpation of the chest reveals - Non-tender. Auscultation Breath  sounds - Normal and Clear.  Cardiovascular Auscultation Rhythm - Regular. Murmurs & Other Heart Sounds - Auscultation of the heart reveals - No Murmurs and No Systolic Clicks.  Abdomen Inspection Inspection of the abdomen reveals - No Visible peristalsis and No Abnormal pulsations. Umbilicus - No Bleeding, No Urine drainage. Palpation/Percussion Palpation and Percussion of the abdomen reveal - Soft, Non  Tender, No Rebound tenderness, No Rigidity (guarding) and No Cutaneous hyperesthesia. Note: Abdomen flat & soft. Nontender. Not distended. Thin. Firm somewhat mobile mass in right paramedian region. Not tender. No umbilical or incisional hernias. No guarding.  Male Genitourinary Sexual Maturity Tanner 5 - Adult hair pattern and Adult penile size and shape. Note: No inguinal hernias. Normal external genitalia. Epididymi, testes, and spermatic cords normal without any masses.  Rectal Note: Deferred given recent colonoscopy.  Peripheral Vascular Upper Extremity Inspection - Left - No Cyanotic nailbeds - Left, Not Ischemic. Inspection - Right - No Cyanotic nailbeds - Right, Not Ischemic.  Neurologic Neurologic evaluation reveals -normal attention span and ability to concentrate, able to name objects and repeat phrases. Appropriate fund of knowledge , normal sensation and normal coordination. Mental Status Affect - not angry, not paranoid. Cranial Nerves-Normal Bilaterally. Gait-Normal.  Neuropsychiatric Mental status exam performed with findings of-able to articulate well with normal speech/language, rate, volume and coherence, thought content normal with ability to perform basic computations and apply abstract reasoning and no evidence of hallucinations, delusions, obsessions or homicidal/suicidal ideation.  Musculoskeletal Global Assessment Spine, Ribs and Pelvis - no instability, subluxation or laxity. Right Upper Extremity - no instability, subluxation or  laxity. Note: Moderate kyphosis of thoracic spine  Lymphatic Head & Neck  General Head & Neck Lymphatics: Bilateral - Description - No Localized lymphadenopathy. Axillary  General Axillary Region: Bilateral - Description - No Localized lymphadenopathy. Femoral & Inguinal  Generalized Femoral & Inguinal Lymphatics: Left - Description - No Localized lymphadenopathy. Right - Description - No Localized lymphadenopathy.    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  Pathology is not back yet although pictures and CAT scan highly suspicious. He reached out to the pathology department and asked them to try and get any answer in the next day or so.  CT scan of chest and CEA blood level to complete colon cancer workup.  He would benefit from segmental colonic resection. Good candidate for minimally invasive approach. Ideally would do a robotic medial to lateral resection with intracorporeal anastomosis. Extraction at the Pfannenstiel incision. We can feel something palpable, I suspect it's is chronically distended cecum more than anything else. The tumor itself does not look particularly bulky on CAT scan. It seems more at the hepatic flexure and the cecum to be, but it doesn't change the plan of surgical removal. He is quite active and does not smoke. He is interesting proceeding as soon as possible.    The anatomy & physiology of the digestive tract was discussed. The pathophysiology of the colon was discussed. Natural history risks without surgery was discussed. I feel the risks of no intervention will lead to serious problems that outweigh the operative risks; therefore, I recommended a partial colectomy to remove the pathology. Minimally invasive (Robotic/Laparoscopic) & open techniques were discussed.  Risks such as bleeding, infection, abscess, leak,  reoperation, possible ostomy, hernia, heart attack, death, and other risks were discussed. I noted a good likelihood this will help address the problem. Goals of post-operative recovery were discussed as well. Need for adequate nutrition, daily bowel regimen and healthy physical activity, to optimize recovery was noted as well. We will work to minimize complications. Educational materials were available as well. Questions were answered. The patient expresses understanding & wishes to proceed with surgery.  [name], MD, FACS, MASCRS Gastrointestinal and Minimally Invasive Surgery  [address] [place], [place] [address] [phone] Main / Paging [phone] Fax

## 2018-11-20 NOTE — Discharge Instructions (Signed)
SURGERY: POST OP INSTRUCTIONS °(Surgery for small bowel obstruction, colon resection, etc) ° ° °###################################################################### ° °EAT °Gradually transition to a high fiber diet with a fiber supplement over the next few days after discharge ° °WALK °Walk an hour a day.  Control your pain to do that.   ° °CONTROL PAIN °Control pain so that you can walk, sleep, tolerate sneezing/coughing, go up/down stairs. ° °HAVE A BOWEL MOVEMENT DAILY °Keep your bowels regular to avoid problems.  OK to try a laxative to override constipation.  OK to use an antidairrheal to slow down diarrhea.  Call if not better after 2 tries ° °CALL IF YOU HAVE PROBLEMS/CONCERNS °Call if you are still struggling despite following these instructions. °Call if you have concerns not answered by these instructions ° °###################################################################### ° ° °DIET °Follow a light diet the first few days at home.  Start with a bland diet such as soups, liquids, starchy foods, low fat foods, etc.  If you feel full, bloated, or constipated, stay on a ful liquid or pureed/blenderized diet for a few days until you feel better and no longer constipated. °Be sure to drink plenty of fluids every day to avoid getting dehydrated (feeling dizzy, not urinating, etc.). °Gradually add a fiber supplement to your diet over the next week.  Gradually get back to a regular solid diet.  Avoid fast food or heavy meals the first week as you are more likely to get nauseated. °It is expected for your digestive tract to need a few months to get back to normal.  It is common for your bowel movements and stools to be irregular.  You will have occasional bloating and cramping that should eventually fade away.  Until you are eating solid food normally, off all pain medications, and back to regular activities; your bowels will not be normal. °Focus on eating a low-fat, high fiber diet the rest of your life  (See Getting to Good Bowel Health, below). ° °CARE of your INCISION or WOUND °It is good for closed incision and even open wounds to be washed every day.  Shower every day.  Short baths are fine.  Wash the incisions and wounds clean with soap & water.    °If you have a closed incision(s), wash the incision with soap & water every day.  You may leave closed incisions open to air if it is dry.   You may cover the incision with clean gauze & replace it after your daily shower for comfort. °If you have skin tapes (Steristrips) or skin glue (Dermabond) on your incision, leave them in place.  They will fall off on their own like a scab.  You may trim any edges that curl up with clean scissors.  If you have staples, set up an appointment for them to be removed in the office in 10 days after surgery.  °If you have a drain, wash around the skin exit site with soap & water and place a new dressing of gauze or band aid around the skin every day.  Keep the drain site clean & dry.    °If you have an open wound with packing, see wound care instructions.  In general, it is encouraged that you remove your dressing and packing, shower with soap & water, and replace your dressing once a day.  Pack the wound with clean gauze moistened with normal (0.9%) saline to keep the wound moist & uninfected.  Pressure on the dressing for 30 minutes will stop most wound   bleeding.  Eventually your body will heal & pull the open wound closed over the next few months.  °Raw open wounds will occasionally bleed or secrete yellow drainage until it heals closed.  Drain sites will drain a little until the drain is removed.  Even closed incisions can have mild bleeding or drainage the first few days until the skin edges scab over & seal.   °If you have an open wound with a wound vac, see wound vac care instructions. ° ° ° ° °ACTIVITIES as tolerated °Start light daily activities --- self-care, walking, climbing stairs-- beginning the day after surgery.   Gradually increase activities as tolerated.  Control your pain to be active.  Stop when you are tired.  Ideally, walk several times a day, eventually an hour a day.   °Most people are back to most day-to-day activities in a few weeks.  It takes 4-8 weeks to get back to unrestricted, intense activity. °If you can walk 30 minutes without difficulty, it is safe to try more intense activity such as jogging, treadmill, bicycling, low-impact aerobics, swimming, etc. °Save the most intensive and strenuous activity for last (Usually 4-8 weeks after surgery) such as sit-ups, heavy lifting, contact sports, etc.  Refrain from any intense heavy lifting or straining until you are off narcotics for pain control.  You will have off days, but things should improve week-by-week. °DO NOT PUSH THROUGH PAIN.  Let pain be your guide: If it hurts to do something, don't do it.  Pain is your body warning you to avoid that activity for another week until the pain goes down. °You may drive when you are no longer taking narcotic prescription pain medication, you can comfortably wear a seatbelt, and you can safely make sudden turns/stops to protect yourself without hesitating due to pain. °You may have sexual intercourse when it is comfortable. If it hurts to do something, stop. ° °MEDICATIONS °Take your usually prescribed home medications unless otherwise directed.   °Blood thinners:  °Usually you can restart any strong blood thinners after the second postoperative day.  It is OK to take aspirin right away.    ° If you are on strong blood thinners (warfarin/Coumadin, Plavix, Xerelto, Eliquis, Pradaxa, etc), discuss with your surgeon, medicine PCP, and/or cardiologist for instructions on when to restart the blood thinner & if blood monitoring is needed (PT/INR blood check, etc).   ° ° °PAIN CONTROL °Pain after surgery or related to activity is often due to strain/injury to muscle, tendon, nerves and/or incisions.  This pain is usually  short-term and will improve in a few months.  °To help speed the process of healing and to get back to regular activity more quickly, DO THE FOLLOWING THINGS TOGETHER: °1. Increase activity gradually.  DO NOT PUSH THROUGH PAIN °2. Use Ice and/or Heat °3. Try Gentle Massage and/or Stretching °4. Take over the counter pain medication °5. Take Narcotic prescription pain medication for more severe pain ° °Good pain control = faster recovery.  It is better to take more medicine to be more active than to stay in bed all day to avoid medications. °1.  Increase activity gradually °Avoid heavy lifting at first, then increase to lifting as tolerated over the next 6 weeks. °Do not “push through” the pain.  Listen to your body and avoid positions and maneuvers than reproduce the pain.  Wait a few days before trying something more intense °Walking an hour a day is encouraged to help your body recover faster   and more safely.  Start slowly and stop when getting sore.  If you can walk 30 minutes without stopping or pain, you can try more intense activity (running, jogging, aerobics, cycling, swimming, treadmill, sex, sports, weightlifting, etc.) °Remember: If it hurts to do it, then don’t do it! °2. Use Ice and/or Heat °You will have swelling and bruising around the incisions.  This will take several weeks to resolve. °Ice packs or heating pads (6-8 times a day, 30-60 minutes at a time) will help sooth soreness & bruising. °Some people prefer to use ice alone, heat alone, or alternate between ice & heat.  Experiment and see what works best for you.  Consider trying ice for the first few days to help decrease swelling and bruising; then, switch to heat to help relax sore spots and speed recovery. °Shower every day.  Short baths are fine.  It feels good!  Keep the incisions and wounds clean with soap & water.   °3. Try Gentle Massage and/or Stretching °Massage at the area of pain many times a day °Stop if you feel pain - do not  overdo it °4. Take over the counter pain medication °This helps the muscle and nerve tissues become less irritable and calm down faster °Choose ONE of the following over-the-counter anti-inflammatory medications: °Acetaminophen 500mg tabs (Tylenol) 1-2 pills with every meal and just before bedtime (avoid if you have liver problems or if you have acetaminophen in you narcotic prescription) °Naproxen 220mg tabs (ex. Aleve, Naprosyn) 1-2 pills twice a day (avoid if you have kidney, stomach, IBD, or bleeding problems) °Ibuprofen 200mg tabs (ex. Advil, Motrin) 3-4 pills with every meal and just before bedtime (avoid if you have kidney, stomach, IBD, or bleeding problems) °Take with food/snack several times a day as directed for at least 2 weeks to help keep pain / soreness down & more manageable. °5. Take Narcotic prescription pain medication for more severe pain °A prescription for strong pain control is often given to you upon discharge (for example: oxycodone/Percocet, hydrocodone/Norco/Vicodin, or tramadol/Ultram) °Take your pain medication as prescribed. °Be mindful that most narcotic prescriptions contain Tylenol (acetaminophen) as well - avoid taking too much Tylenol. °If you are having problems/concerns with the prescription medicine (does not control pain, nausea, vomiting, rash, itching, etc.), please call us [phone] to see if we need to switch you to a different pain medicine that will work better for you and/or control your side effects better. °If you need a refill on your pain medication, you must call the office before 4 pm and on weekdays only.  By federal law, prescriptions for narcotics cannot be called into a pharmacy.  They must be filled out on paper & picked up from our office by the patient or authorized caretaker.  Prescriptions cannot be filled after 4 pm nor on weekends.   ° °WHEN TO CALL US [phone] °Severe uncontrolled or worsening pain  °Fever over 101 F (38.5 C) °Concerns with  the incision: Worsening pain, redness, rash/hives, swelling, bleeding, or drainage °Reactions / problems with new medications (itching, rash, hives, nausea, etc.) °Nausea and/or vomiting °Difficulty urinating °Difficulty breathing °Worsening fatigue, dizziness, lightheadedness, blurred vision °Other concerns °If you are not getting better after two weeks or are noticing you are getting worse, contact our office [phone] for further advice.  We may need to adjust your medications, re-evaluate you in the office, send you to the emergency room, or see what other things we can do to help. °The   clinic staff is available to answer your questions during regular business hours (8:30am-5pm).  Please don’t hesitate to call and ask to speak to one of our nurses for clinical concerns.    °A surgeon from [facility] is always on call at the hospitals 24 hours/day °If you have a medical emergency, go to the nearest emergency room or call 911. ° °FOLLOW UP in our office °One the day of your discharge from the hospital (or the next business weekday), please call [facility] to set up or confirm an appointment to see your surgeon in the office for a follow-up appointment.  Usually it is 2-3 weeks after your surgery.   °If you have skin staples at your incision(s), let the office know so we can set up a time in the office for the nurse to remove them (usually around 10 days after surgery). °Make sure that you call for appointments the day of discharge (or the next business weekday) from the hospital to ensure a convenient appointment time. °IF YOU HAVE DISABILITY OR FAMILY LEAVE FORMS, BRING THEM TO THE OFFICE FOR PROCESSING.  DO NOT GIVE THEM TO YOUR DOCTOR. ° °[facility] °[address], [address], [place],   [address] ? °[phone] - Main °[phone] - Toll Free,  [phone] - Fax °[email or web address] ° °GETTING TO GOOD BOWEL HEALTH. °It is  expected for your digestive tract to need a few months to get back to normal.  It is common for your bowel movements and stools to be irregular.  You will have occasional bloating and cramping that should eventually fade away.  Until you are eating solid food normally, off all pain medications, and back to regular activities; your bowels will not be normal.   °Avoiding constipation °The goal: ONE SOFT BOWEL MOVEMENT A DAY!    °Drink plenty of fluids.  Choose water first. °TAKE A FIBER SUPPLEMENT EVERY DAY THE REST OF YOUR LIFE °During your first week back home, gradually add back a fiber supplement every day °Experiment which form you can tolerate.   There are many forms such as powders, tablets, wafers, gummies, etc °Psyllium bran (Metamucil), methylcellulose (Citrucel), Miralax or Glycolax, Benefiber, Flax Seed.  °Adjust the dose week-by-week (1/2 dose/day to 6 doses a day) until you are moving your bowels 1-2 times a day.  Cut back the dose or try a different fiber product if it is giving you problems such as diarrhea or bloating. °Sometimes a laxative is needed to help jump-start bowels if constipated until the fiber supplement can help regulate your bowels.  If you are tolerating eating & you are farting, it is okay to try a gentle laxative such as double dose MiraLax, prune juice, or Milk of Magnesia.  Avoid using laxatives too often. °Stool softeners can sometimes help counteract the constipating effects of narcotic pain medicines.  It can also cause diarrhea, so avoid using for too long. °If you are still constipated despite taking fiber daily, eating solids, and a few doses of laxatives, call our office. °Controlling diarrhea °Try drinking liquids and eating bland foods for a few days to avoid stressing your intestines further. °Avoid dairy products (especially milk & ice cream) for a short time.  The intestines often can lose the ability to digest lactose when stressed. °Avoid foods that cause gassiness or  bloating.  Typical foods include beans and other legumes, cabbage, broccoli, and dairy foods.  Avoid greasy, spicy, fast foods.  Every person has   some sensitivity to other foods, so listen to your body and avoid those foods that trigger problems for you. °Probiotics (such as active yogurt, Align, etc) may help repopulate the intestines and colon with normal bacteria and calm down a sensitive digestive tract °Adding a fiber supplement gradually can help thicken stools by absorbing excess fluid and retrain the intestines to act more normally.  Slowly increase the dose over a few weeks.  Too much fiber too soon can backfire and cause cramping & bloating. °It is okay to try and slow down diarrhea with a few doses of antidiarrheal medicines.   °Bismuth subsalicylate (ex. Kayopectate, Pepto Bismol) for a few doses can help control diarrhea.  Avoid if pregnant.   °Loperamide (Imodium) can slow down diarrhea.  Start with one tablet (2mg) first.  Avoid if you are having fevers or severe pain.  °ILEOSTOMY PATIENTS WILL HAVE CHRONIC DIARRHEA since their colon is not in use.    °Drink plenty of liquids.  You will need to drink even more glasses of water/liquid a day to avoid getting dehydrated. °Record output from your ileostomy.  Expect to empty the bag every 3-4 hours at first.  Most people with a permanent ileostomy empty their bag 4-6 times at the least.   °Use antidiarrheal medicine (especially Imodium) several times a day to avoid getting dehydrated.  Start with a dose at bedtime & breakfast.  Adjust up or down as needed.  Increase antidiarrheal medications as directed to avoid emptying the bag more than 8 times a day (every 3 hours). °Work with your wound ostomy nurse to learn care for your ostomy.  See ostomy care instructions. °TROUBLESHOOTING IRREGULAR BOWELS °1) Start with a soft & bland diet. No spicy, greasy, or fried foods.  °2) Avoid gluten/wheat or dairy products from diet to see if symptoms improve. °3) Miralax  17gm or flax seed mixed in 8oz. water or juice-daily. May use 2-4 times a day as needed. °4) Gas-X, Phazyme, etc. as needed for gas & bloating.  °5) Prilosec (omeprazole) over-the-counter as needed °6)  Consider probiotics (Align, Activa, etc) to help calm the bowels down ° °Call your doctor if you are getting worse or not getting better.  Sometimes further testing (cultures, endoscopy, X-ray studies, CT scans, bloodwork, etc.) may be needed to help diagnose and treat the cause of the diarrhea. °[facility] °[address], [address], [place], [place]  [address] °[phone] - Main.    °[phone]  - Toll Free.   [phone] - Fax °[email or web address] ° ° ° °Colorectal Cancer ° °Colorectal cancer is an abnormal growth of cells and tissue (tumor) in the colon or rectum, which are parts of the large intestine. The cancer can spread (metastasize) to other parts of the body. °What are the causes? °Most cases of colorectal cancer start as abnormal growths called polyps on the inner wall of the colon or rectum. Other times, abnormal changes to genes (genetic mutations) can cause cells to form cancer. °What increases the risk? °You are more likely to develop this condition if: °· You are older than age 50. °· You have multiple polyps in the colon or rectum. °· You have diabetes. °· You are African American. °· You have a family history of Lynch syndrome. °· You have had cancer before. °· You have certain hereditary conditions, such as: °? Familial adenomatous polyposis. °? Turcot syndrome. °? Peutz-Jeghers syndrome. °· You eat a diet that is high in fat (especially animal fat)   and low in fiber, fruits, and vegetables. °· You have an inactive (sedentary) lifestyle. °· You have an inflammatory bowel disease or Crohn's disease. °· You smoke. °· You drink alcohol excessively. °What are the signs or symptoms? °Early colorectal cancer often does not cause symptoms. As the cancer grows,  symptoms may include: °· Changes in bowel habits. °· Feeling like the bowel does not empty completely after a bowel movement. °· Stools that are narrower than usual. °· Blood in the stool. °· Diarrhea. °· Constipation. °· Anemia. °· Discomfort, pain, bloating, fullness, or cramps in the abdomen. °· Frequent gas pain. °· Unexplained weight loss. °· Constant fatigue. °· Nausea and vomiting. °How is this diagnosed? °This condition may be diagnosed with: °· A medical history. °· A physical exam. °· Tests. These may include: °? A digital rectal exam. °? A stool test called a fecal occult blood test. °? Blood tests. °? A test in which a tissue sample is taken from the colon or rectum and examined under a microscope (biopsy). °? Imaging tests, such as: °§ X-rays. °§ A barium enema. °§ CT scans. °§ MRIs. °§ A sigmoidoscopy. This test is done to view the inside of the rectum. °§ A colonoscopy. This test is done to view the inside of the colon. During this test, small polyps can be removed or biopsies may be taken. °§ An endorectal ultrasound. This test checks how deep a tumor in the rectum has grown and whether the cancer has spread to lymph nodes or other nearby tissues. °Your health care provider may order additional tests to find out whether the cancer has spread to other parts of the body (what stage it is). The stages of cancer include: °· Stage 0. At this stage, the cancer is found only in the innermost lining of the colon or rectum. °· Stage I. At this stage, the cancer has grown into the inner wall of the colon or rectum. °· Stage II. At this stage, the cancer has gone more deeply into the wall of the colon or rectum or through the wall. It may have invaded nearby tissue. °· Stage III. At this stage, the cancer has spread to nearby lymph nodes. °· Stage IV. At this stage, the cancer has spread to other parts of the body, such as the liver or lungs. °How is this treated? °Treatment for this condition depends on the  type and stage of the cancer. Treatment may include: °· Surgery. In the early stages of the cancer, surgery may be done to remove polyps or small tumors from the colon. In later stages, surgery may be done to remove part of the colon. °· Chemotherapy. This treatment uses medicines to kill cancer cells. °· Targeted therapy. This treatment targets specific gene mutations or proteins that the cancer expresses in order to kill tumor cells. °· Radiation therapy. This treatment uses radiation to kill cancer cells or shrink tumors. °· Radiofrequency ablation. This treatment uses radio waves to destroy the tumors that may have spread to other areas of the body, such as the liver. °Follow these instructions at home: °· Take over-the-counter and prescription medicines only as told by your health care provider. °· Try to eat regular, healthy meals. Some of your treatments might affect your appetite. If you are having problems eating or with your appetite, ask to meet with a food and nutrition specialist (dietitian). °· Consider joining a support group. This may help you learn about your diagnosis and cope with the stress of   having colorectal cancer. °· If you are admitted to the hospital, inform your cancer care team. °· Keep all follow-up visits as told by your health care provider. This is important. °How is this prevented? °· Colorectal cancer can be prevented with screening tests that find polyps so they can be removed before they develop into cancer. °· All adults should have screening for colorectal cancer starting at age 50 and continuing until age 75. Your health care provider may recommend screening at age 45. People at increased risk should start screening at an earlier age. °Where to find more information °· American Cancer Society: https://www.cancer.org °· National Cancer Institute (NCI): https://www.cancer.gov °Contact a health care provider if: °· Your diarrhea or constipation does not go away. °· You have blood  in your stool. °· Your bowel habits change. °· You have increased pain in your abdomen. °· You notice new fatigue or weakness. °· You lose weight. °Get help right away if: °· You have increased bleeding from your rectum. °· You have any uncontrollable or severe abdomen (abdominal) symptoms. °Summary °· Colorectal cancer is an abnormal growth of cells and tissue (tumor) in the colon or rectum. °· Common risk factors for this condition include having a relative with colon cancer, being older in age, having an inflammatory bowel disease, and being African American. °· This condition may be diagnosed with tests, such as a colonoscopy and biopsy. °· Treatment depends on the type and stage of the cancer. Commonly, treatment includes surgery to remove the tumor along with chemotherapy or targeted therapy. °· Keep all follow-up visits as told by your health care provider. This is important. °This information is not intended to replace advice given to you by your health care provider. Make sure you discuss any questions you have with your health care provider. °Document Released: 03/13/2005 Document Revised: 05/03/2017 Document Reviewed: 04/14/2016 °[employer]vier Patient Education © 2020 [employer] ° °

## 2018-11-21 LAB — MAGNESIUM: Magnesium: 2.2 mg/dL (ref 1.7–2.4)

## 2018-11-21 LAB — BASIC METABOLIC PANEL
Anion gap: 8 (ref 5–15)
BUN: 13 mg/dL (ref 8–23)
CO2: 25 mmol/L (ref 22–32)
Calcium: 7.8 mg/dL — ABNORMAL LOW (ref 8.9–10.3)
Chloride: 105 mmol/L (ref 98–111)
Creatinine, Ser: 0.98 mg/dL (ref 0.61–1.24)
GFR calc Af Amer: >60 mL/min (ref >60)
GFR calc non Af Amer: >60 mL/min (ref >60)
Glucose, Bld: 111 mg/dL — ABNORMAL HIGH (ref 70–99)
Potassium: 3.7 mmol/L (ref 3.5–5.1)
Sodium: 138 mmol/L (ref 135–145)

## 2018-11-21 LAB — CBC
HCT: 22.5 % — ABNORMAL LOW (ref 39.0–52.0)
Hemoglobin: 6.6 g/dL — CL (ref 13.0–17.0)
MCH: 25 pg — ABNORMAL LOW (ref 26.0–34.0)
MCHC: 29.3 g/dL — ABNORMAL LOW (ref 30.0–36.0)
MCV: 85.2 fL (ref 80.0–100.0)
Platelets: 380 10*3/uL (ref 150–400)
RBC: 2.64 MIL/uL — ABNORMAL LOW (ref 4.22–5.81)
RDW: 15.5 % (ref 11.5–15.5)
WBC: 11.4 10*3/uL — ABNORMAL HIGH (ref 4.0–10.5)
nRBC: 0 % (ref 0.0–0.2)

## 2018-11-21 LAB — PREPARE RBC (CROSSMATCH)

## 2018-11-21 MED ORDER — SODIUM CHLORIDE 0.9% IV SOLUTION: Freq: Once | INTRAVENOUS | Status: DC

## 2018-11-21 NOTE — Progress Notes (Signed)
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  Transfuse 1 unit since blood pressure low.  ERAS enhance recovery pathway.  Follow-up pathology.  With his history of fall and hip weakness, will have physical and Occupational Therapy evaluate to see if he would benefit from home health or further intervention is needed. -VTE prophylaxis- SCDs, etc -mobilize as tolerated to help recovery  20 minutes spent in review, evaluation, examination, counseling, and coordination of care.  More than 50% of that time was spent in counseling.  11/21/2018    Subjective: (Chief complaint)  Minimal soreness.  Tolerating liquids.  Feels weak at his hip from when he slipped and fell at home 2 weeks ago.  Objective:  Vital signs:  Vitals:   11/20/18 1628 11/20/18 1637 11/20/18 2050 11/21/18 0628  BP: (!) 115/57 124/72 115/63 (!) 103/54  Pulse: 66 67 65 67  Resp: 18 12 16 12   Temp: (!) 97.5 F (36.4 C) 97.6 F (36.4 C) (!)  97.5 F (36.4 C) 98.2 F (36.8 C)  TempSrc: Oral Oral Oral Oral  SpO2:  96% 93% 100%  Weight:      Height:        Last BM Date: 11/20/18  Intake/Output   Yesterday:  08/26 0701 - 08/27 0700 In: 1950 [I.V.:1700; IV Piggyback:250] Out: [id number]; Blood:100] This shift:  Total I/O In: -  Out: 675 [Urine:675]  Bowel function:  Flatus: YES  BM:  No  Drain: (No drain)   Physical Exam:  General: Pt awake/alert/oriented x4 in no acute distress Eyes: PERRL, normal EOM.  Sclera clear.  No icterus Neuro: CN II-XII intact w/o focal sensory/motor deficits. Lymph: No head/neck/groin lymphadenopathy Psych:  No delerium/psychosis/paranoia HENT: Normocephalic, Mucus membranes moist.  No thrush Neck: Supple, No tracheal deviation Chest:  No chest wall pain w good excursion CV:  Pulses intact.  Regular rhythm MS: Normal AROM mjr joints.  No obvious deformity  Abdomen: Soft.  Nondistended.  Mildly tender at incisions only.  No evidence of peritonitis.  No incarcerated hernias.  Ext:  No deformity.  No mjr edema.  No cyanosis Skin: No petechiae / purpura  Results:   Cultures: Recent Results (from the past 720 hour(s))  SARS CORONAVIRUS 2 Nasal Swab Aptima Multi Swab     Status: None   Collection Time: 11/16/18 10:26 AM   Specimen: Aptima Multi Swab; Nasal Swab  Result Value Ref Range Status   SARS Coronavirus 2 NEGATIVE NEGATIVE Final    Comment: (NOTE) SARS-CoV-2 target nucleic acids are NOT DETECTED. The SARS-CoV-2 RNA is generally detectable in  upper and lower respiratory specimens during the acute phase of infection. Negative results do not preclude SARS-CoV-2 infection, do not rule out co-infections with other pathogens, and should not be used as the sole basis for treatment or other patient management decisions. Negative results must be combined with clinical observations, patient history, and epidemiological information. The expected result is Negative. Fact  Sheet for Patients: SugarRoll.be Fact Sheet for Healthcare Providers: https://www.woods-mathews.com/ This test is not yet approved or cleared by the [place] FDA and  has been authorized for detection and/or diagnosis of SARS-CoV-2 by FDA under an Emergency Use Authorization (EUA). This EUA will remain  in effect (meaning this test can be used) for the duration of the COVID-19 declaration under Section 56 4(b)(1) of the Act, 21 U.S.C. section 360bbb-3(b)(1), unless the authorization is terminated or revoked sooner. Performed at [facility], [place] [address], [place], [place] [address]     Labs: Results for orders placed or performed during the hospital encounter of 11/20/18 (from the past 48 hour(s))  Basic metabolic panel     Status: Abnormal   Collection Time: 11/21/18  3:00 AM  Result Value Ref Range   Sodium 138 135 - 145 mmol/L   Potassium 3.7 3.5 - 5.1 mmol/L   Chloride 105 98 - 111 mmol/L   CO2 25 22 - 32 mmol/L   Glucose, Bld 111 (H) 70 - 99 mg/dL   BUN 13 8 - 23 mg/dL   Creatinine, Ser 0.98 0.61 - 1.24 mg/dL   Calcium 7.8 (L) 8.9 - 10.3 mg/dL   GFR calc non Af Amer >60 >60 mL/min   GFR calc Af Amer >60 >60 mL/min   Anion gap 8 5 - 15    Comment: Performed at [facility], [place] [address] [place], [place] [address]  CBC     Status: Abnormal   Collection Time: 11/21/18  3:00 AM  Result Value Ref Range   WBC 11.4 (H) 4.0 - 10.5 K/uL   RBC 2.64 (L) 4.22 - 5.81 MIL/uL   Hemoglobin 6.6 (LL) 13.0 - 17.0 g/dL    Comment: This critical result has verified and been called to [facility] by Rozelle Logan on 08 27 2020 at [place], and has been read back. CRITICAL RESULTS VERIFIED   HCT 22.5 (L) 39.0 - 52.0 %   MCV 85.2 80.0 - 100.0 fL   MCH 25.0 (L) 26.0 - 34.0 pg   MCHC 29.3 (L) 30.0 - 36.0 g/dL   RDW 15.5 11.5 - 15.5 %   Platelets 380 150 - 400 K/uL   nRBC 0.0 0.0 - 0.2 %    Comment: Performed at [facility], [place] [address], [place], [place] [address]  Magnesium     Status: None   Collection Time: 11/21/18  3:00 AM  Result Value Ref Range   Magnesium 2.2 1.7 - 2.4 mg/dL    Comment: Performed at [facility], [place] [address], [place], [place] [address]    Imaging / Studies: No results found.  Medications / Allergies: per chart  Antibiotics: Anti-infectives (From admission, onward)   Start     Dose/Rate Route Frequency Ordered Stop   11/20/18 1800  cefoTEtan (CEFOTAN) 2 g in sodium chloride 0.9 % 100 mL IVPB     2 g 200 mL/hr over 30 Minutes Intravenous Every 12 hours 11/20/18 1231 11/20/18 1823   11/20/18 1400  neomycin (MYCIFRADIN) tablet 1,000 mg  Status:  Discontinued  1,000 mg Oral 3 times per day 11/20/18 0637 11/20/18 0649   11/20/18 1400  metroNIDAZOLE (FLAGYL) tablet 1,000 mg  Status:  Discontinued     1,000 mg Oral 3 times per day 11/20/18 [id number] 11/20/18 0649   11/20/18 1126  clindamycin (CLEOCIN) 900 mg, gentamicin (GARAMYCIN) 240 mg in sodium chloride 0.9 % 1,000 mL for intraperitoneal lavage  Status:  Discontinued       As needed 11/20/18 1127 11/20/18 1145   11/20/18 0645  cefoTEtan (CEFOTAN) 2 g in sodium chloride 0.9 % 100 mL IVPB     2 g 200 mL/hr over 30 Minutes Intravenous On call to O.R. 11/20/18 [id number] 11/20/18 0851   11/20/18 0600  clindamycin (CLEOCIN) 900 mg, gentamicin (GARAMYCIN) 240 mg in sodium chloride 0.9 % 1,000 mL for intraperitoneal lavage  Status:  Discontinued      Irrigation To Surgery 11/19/18 0749 11/20/18 1231        Note: Portions of this report may have been transcribed using voice recognition software. Every effort was made to ensure accuracy; however, inadvertent computerized transcription errors may be present.   Any transcriptional errors that result from this process are unintentional.     [name], MD, FACS, MASCRS Gastrointestinal and Minimally Invasive Surgery    [address], [place], [place] [address] [phone] Main / Paging [phone] Fax

## 2018-11-21 NOTE — Progress Notes (Addendum)
CRITICAL VALUE ALERT  Critical Value:  Hgb-6.6  Date & Time Notied:  11/21/2018 at 0410  Provider Notified: Paged Dr Michael Boston  11/21/2018 at [phone]  Orders Received/Actions taken: waiting for MD to response

## 2018-11-21 NOTE — Evaluation (Addendum)
Physical Therapy Evaluation Patient Details Name: Oscar Sparks MRN: [id number] DOB: 1940-09-09 Today's Date: 11/21/2018   History of Present Illness  pt with ascending colon cancer and s/p proximal colectomy. hx of back surgery 2004, prostate cancer, and reports he fell 2 weeks ago on his left hip and it has been sore and painful ever since. He did not get an xray secondary to advice from primary and to not be out and about prior to this surgery.  Clinical Impression  Pt with some general weakness noted from surgery and some slight balance difficulties. Pt stated he had walked with nursing around unit earlier with RW , so our distance was limited and pt was having watery loose stools. 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 Pt stated he has noticed it has become more difficult for him to walk in the last few years. He has not explanation for the noted LE side weakness. At this time feel he will benefit from acute skilled PT and HHPT once DC for safety at home and f/u to see if can progress with balance and progressing off RW.   His wife is also getting admitted currently into [facility] hospital.      Follow Up Recommendations Home health PT    Equipment Recommendations  Rolling walker with 5" wheels    Recommendations for Other Services       Precautions / Restrictions Precautions Precautions: Fall Precaution Comments: recent fall at home within the last 10 days Restrictions Weight Bearing Restrictions: No      Mobility  Bed Mobility Overal bed mobility: Independent                Transfers Overall transfer level: Needs assistance Equipment used: Rolling walker (2 wheeled) Transfers: Sit to/from Stand Sit to Stand: Min assist         General transfer comment: Min A to steady upon rising and starting to intial step with some LOB and A needed to correct.  Ambulation/Gait Ambulation/Gait  assistance: Min assist Gait Distance (Feet): 60 Feet Assistive device: Rolling walker (2 wheeled) Gait Pattern/deviations: Narrow base of support     General Gait Details: L foot with decreased DF and foot clearnce and L ankle enversion ( at times catchng on R foot) , decreased stance time on L as well. Didatnce limited due to loose water bowels. He stated he walked with RW and nursing the entire loop earlier today.  Stairs            Wheelchair Mobility    Modified Rankin (Stroke Patients Only)       Balance Overall balance assessment: Needs assistance Sitting-balance support: Bilateral upper extremity supported Sitting balance-Leahy Scale: Fair Sitting balance - Comments: some sway in sitting blance and listed to the L slightly at times when performing other tasks.   Standing balance support: Bilateral upper extremity supported Standing balance-Leahy Scale: Fair Standing balance comment: slight LOB with rising and to trun to initiate stepping                             Pertinent Vitals/Pain Pain Assessment: Faces Faces Pain Scale: Hurts little more(left hip with mobility) Pain Location: L lateral hip area Pain Descriptors / Indicators: Sore Pain Intervention(s): Monitored during session;Limited activity within patient's tolerance    Home Living Family/patient expects to be discharged to:: Private residence Living Arrangements:  Spouse/significant other(however wife just entered hospital tonight with potential need for surgery for appendix.) Available Help at Discharge: Family(dtr will be able to stay with her father and mother when they both return home for a little while) Type of Home: House Home Access: Stairs to enter   [employer] of Steps: 2(no difficulty before) Home Layout: One level Home Equipment: Cane - single point Additional Comments: may have access to a RW    Prior Function Level of Independence: Independent          Comments: works at a nursing home . pt with great difficluties walking last 2 weeks due to the pain in the L hip after the fall. used a cane recently for this.     Hand Dominance        Extremity/Trunk Assessment        Lower Extremity Assessment Lower Extremity Assessment: Generalized weakness;LLE deficits/detail LLE Deficits / Details: noted L DF weakness ( foot drop) 2/5, and knee extension 4/5, knee flexion 3/5, hip abduction 3-/5 and adduction 3/5. Atrophy noted in entire LLE compared to RLE    Cervical / Trunk Assessment Cervical / Trunk Assessment: Kyphotic  Communication   Communication: No difficulties  Cognition Arousal/Alertness: Awake/alert Behavior During Therapy: WFL for tasks assessed/performed Overall Cognitive Status: Within Functional Limits for tasks assessed                                        General Comments      Exercises     Assessment/Plan    PT Assessment Patient needs continued PT services  PT Problem List Decreased strength;Decreased range of motion;Decreased activity tolerance;Decreased mobility       PT Treatment Interventions Gait training;Functional mobility training;Therapeutic activities;Therapeutic exercise;Balance training    PT Goals (Current goals can be found in the Care Plan section)  Acute Rehab PT Goals Patient Stated Goal: I wish I could walk and be active like I used to. Just seems my legs ( especially my L one) dont' work the past few years. PT Goal Formulation: With patient Time For Goal Achievement: 12/05/18 Potential to Achieve Goals: Good    Frequency Min 3X/week   Barriers to discharge        Co-evaluation               AM-PAC PT "6 Clicks" Mobility  Outcome Measure Help needed turning from your back to your side while in a flat bed without using bedrails?: A Little Help needed moving from lying on your back to sitting on the side of a flat bed without using bedrails?: A  Little Help needed moving to and from a bed to a chair (including a wheelchair)?: A Little Help needed standing up from a chair using your arms (e.g., wheelchair or bedside chair)?: A Little Help needed to walk in hospital room?: A Little Help needed climbing 3-5 steps with a railing? : A Little 6 Click Score: 18    End of Session Equipment Utilized During Treatment: Gait belt Activity Tolerance: Patient tolerated treatment well Patient left: in bed;with bed alarm set Nurse Communication: Mobility status PT Visit Diagnosis: Unsteadiness on feet (R26.81);Muscle weakness (generalized) (M62.81);History of falling (Z91.81)    Time: [id number] PT Time Calculation (min) (ACUTE ONLY): 48 min   Charges:   PT Evaluation $PT Eval Moderate Complexity: 1 Mod PT Treatments $Gait Training: 8-22 mins $Therapeutic Activity: 8-22 mins  Clide Dales, PT Acute Rehabilitation Services Pager: [phone] Office: [phone] 11/21/2018   Clide Dales 11/21/2018, 10:30 PM

## 2018-11-22 ENCOUNTER — Inpatient Hospital Stay (HOSPITAL_COMMUNITY): Payer: PRIVATE HEALTH INSURANCE

## 2018-11-22 DIAGNOSIS — M25552 Pain in left hip: Secondary | ICD-10-CM

## 2018-11-22 LAB — TYPE AND SCREEN
ABO/RH(D): O POS
Antibody Screen: NEGATIVE
Unit division: 0

## 2018-11-22 LAB — BPAM RBC
Blood Product Expiration Date: 202009142359
ISSUE DATE / TIME: 202008270844
Unit Type and Rh: 5100

## 2018-11-22 LAB — CREATININE, SERUM
Creatinine, Ser: 0.79 mg/dL (ref 0.61–1.24)
GFR calc Af Amer: >60 mL/min (ref >60)
GFR calc non Af Amer: >60 mL/min (ref >60)

## 2018-11-22 LAB — HEMOGLOBIN: Hemoglobin: 8.7 g/dL — ABNORMAL LOW (ref 13.0–17.0)

## 2018-11-22 LAB — POTASSIUM: Potassium: 3.7 mmol/L (ref 3.5–5.1)

## 2018-11-22 NOTE — Evaluation (Signed)
Occupational Therapy Evaluation Patient Details Name: Oscar Sparks MRN: [id number] DOB: [birth year] Today's Date: 11/22/2018    History of Present Illness pt with ascending colon cancer and s/p proximal colectomy. hx of back surgery 2004, prostate cancer, and reports he fell 2 weeks ago on his left hip and it has been sore and painful ever since. He did not get an xray secondary to advice from primary and to not be out and about prior to this surgery.   Clinical Impression   78 year old man was admitted for the above sx.  Of note, he has a L foot drop and hip pain and had recent fall prior to sx. Will follow in acute setting with supervision level goals. He mostly needs min A at this time. His daughter will stay with him as his wife is currently in the hospital   Follow Up Recommendations  Home health OT;Supervision/Assistance - 24 hour    Equipment Recommendations  (to be further assessed; pt has high commode)    Recommendations for Other Services       Precautions / Restrictions Precautions Precautions: Fall Precaution Comments: recent fall at home within the last 10 days Restrictions Weight Bearing Restrictions: No      Mobility Bed Mobility               General bed mobility comments: mod A today for OOB from mostly flat bed  Transfers   Equipment used: Rolling walker (2 wheeled)   Sit to Stand: Min assist         General transfer comment: steadying assistance    Balance                                           ADL either performed or assessed with clinical judgement   ADL Overall ADL's : Needs assistance/impaired Eating/Feeding: Independent   Grooming: Set up   Upper Body Bathing: Set up   Lower Body Bathing: Minimal assistance   Upper Body Dressing : Set up   Lower Body Dressing: Moderate assistance   Toilet Transfer: Minimal assistance;Ambulation;RW(chair)   Toileting- Clothing Manipulation and Hygiene: Minimal  assistance         General ADL Comments: Daughter came at end of evaluation.  She did not see bed mobility, which pt needs mod A with. Demonstrated myself on window bench.  Pt gets up on L which is his sore side     Vision         Perception     Praxis      Pertinent Vitals/Pain Faces Pain Scale: Hurts even more Pain Location: L ankle more than hip Pain Descriptors / Indicators: Sore Pain Intervention(s): Limited activity within patient's tolerance;Monitored during session;Patient requesting pain meds-RN notified     Hand Dominance     Extremity/Trunk Assessment             Communication Communication Communication: No difficulties   Cognition Arousal/Alertness: Awake/alert Behavior During Therapy: WFL for tasks assessed/performed Overall Cognitive Status: Within Functional Limits for tasks assessed                                     General Comments       Exercises     Shoulder Instructions      Home Living Family/patient expects  to be discharged to:: Private residence Living Arrangements: Spouse/significant other Available Help at Discharge: Family               Bathroom Shower/Tub: Walk-in [employer] treasurer Toilet: Handicapped height     Home Equipment: Shower seat          Prior Functioning/Environment Level of Independence: Independent        Comments: wife is in the hospital; daughter will stay with him        OT Problem List: Decreased strength;Decreased activity tolerance;Impaired balance (sitting and/or standing);Pain;Decreased knowledge of use of DME or AE;Decreased knowledge of precautions      OT Treatment/Interventions: Self-care/ADL training;DME and/or AE instruction;Balance training;Patient/family education;Therapeutic activities    OT Goals(Current goals can be found in the care plan section) Acute Rehab OT Goals Patient Stated Goal: I wish I could walk and be active like I used to. Just seems my legs  ( especially my L one) dont' work the past few years. OT Goal Formulation: With patient Time For Goal Achievement: 12/06/18 Potential to Achieve Goals: Good ADL Goals Pt Will Transfer to Toilet: with supervision;ambulating(high commode vs 3:1 over it) Pt Will Perform Tub/Shower Transfer: Shower transfer;with min guard assist;ambulating;shower seat Additional ADL Goal #1: pt will perform bed mobility with min A from flat bed in preparation for adls Additional ADL Goal #2: pt will perform LB bathing with set up/supervision and LB dressing with min A  OT Frequency: Min 2X/week   Barriers to D/C:            Co-evaluation              AM-PAC OT "6 Clicks" Daily Activity     Outcome Measure Help from another person eating meals?: None Help from another person taking care of personal grooming?: A Little Help from another person toileting, which includes using toliet, bedpan, or urinal?: A Little Help from another person bathing (including washing, rinsing, drying)?: A Little Help from another person to put on and taking off regular upper body clothing?: A Little Help from another person to put on and taking off regular lower body clothing?: A Lot 6 Click Score: 18   End of Session    Activity Tolerance: Patient tolerated treatment well Patient left: in chair;with call bell/phone within reach;with chair alarm set  OT Visit Diagnosis: Unsteadiness on feet (R26.81);Pain Pain - Right/Left: Left Pain - part of body: Ankle and joints of foot;Hip                Time: [id number] OT Time Calculation (min): 20 min Charges:  OT General Charges $OT Visit: 1 Visit OT Evaluation $OT Eval Low Complexity: [place], OTR/L Acute Rehabilitation Services [phone] [facility] pager [phone] office 11/22/2018  [place] 11/22/2018, 10:54 AM

## 2018-11-22 NOTE — Progress Notes (Signed)
Note: Portions of this report may have been transcribed using voice recognition software. Every effort was made to ensure accuracy; however, inadvertent computerized transcription errors may be present.   Any transcriptional errors that result from this process are unintentional.              Oscar Sparks  1940/12/20 [id number]  Patient Care Team: Laurey Morale, MD as PCP - Huston Foley, MD as Consulting Physician (General Surgery) Ronald Lobo, MD as Consulting Physician (Gastroenterology)   Evaluations by Physical and Occupational Therapy.  Recommendation for home health physical therapy.  Unfortunately, patient's insurance refuses to cover that.  Therefore we will try and set up outpatient physical and occupational therapy through [facility] health system according to case management/social work.  Another option is skilled nursing facility.  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  However I worry about the COVID risks.  Patient and daughter wish to avoid that.  Prescription written for the patient to help establish that.  Discussed with the patient and his daughter as well as his nurse, Eustaquio Maize, in the room.  Patient feeling a little tired and weak.  Wished to wait 1 more day before attempting discharge.  Daughter requested as well since the patient's wife, her mother, just required surgery today as well.  Patient and daughter claim there is additional friends and family to help support him.  Blood pressure was a little soft yesterday but improved after blood transfusion and seems pretty good today.  Just in case, we will check orthostatic vital signs to make sure that he does not have any evidence of any dehydration orthostasis.  If he feels worse, may need medical consultation.  He is otherwise eating well with bowel movements and urinating fine.  Hopefully discharge tomorrow.  I updated the patient's status to the patient, nurse, and family.   Recommendations were made.  Questions were answered.  They expressed understanding & appreciation.   Patient Active Problem List   Diagnosis Date Noted  . 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 Left hip pain 06/25/2014  . 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 Prostate cancer ([place]) 11/15/2009  . 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 CONTACT DERMATITIS 09/04/2007    Past Medical History:  Diagnosis Date  . Arthritis    knees  . Colon cancer ([place]) 09/24/2018  . ED (erectile dysfunction)   . 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 Peripheral vascular disease ([place])    poor curculation in hands and feet hard to monitor with pulse oximetry  . Prostate cancer [facility])    sees Dr. Gaynelle Arabian, received cesium seeds     Past Surgical History:  Procedure Laterality Date  . BACK SURGERY    . SPINE SURGERY  2004   Dr. Sherwood Gambler  . TONSILLECTOMY      Social History   Socioeconomic History  . Marital status: Married    Spouse name: Not on file  . Number of children: Not on file  . Years of education: Not on file  . Highest education level: Not on file  Occupational History  . Not on file  Social Needs  . Financial resource strain: Not on file  . Food insecurity    Worry: Not on file  Inability: Not on file  . Transportation needs    Medical: Not on file    Non-medical: Not on file  Tobacco Use  . Smoking status: Never Smoker  . Smokeless tobacco: Never Used  Substance and Sexual Activity  . Alcohol use: Yes    Alcohol/week: 1.0 standard drinks    Types: 1 Glasses of wine per week  . Drug use: No  . Sexual activity: Not on file  Lifestyle  . Physical activity    Days per week: Not on file    Minutes per session: Not on file  . Stress: Not on file   Relationships  . Social Herbalist on phone: Not on file    Gets together: Not on file    Attends religious service: Not on file    Active member of club or organization: Not on file    Attends meetings of clubs or organizations: Not on file    Relationship status: Not on file  . Intimate partner violence    Fear of current or ex partner: Not on file    Emotionally abused: Not on file    Physically abused: Not on file    Forced sexual activity: Not on file  Other Topics Concern  . Not on file  Social History Narrative  . Not on file    Family History  Problem Relation Age of Onset  . Breast cancer Other     Current Facility-Administered Medications  Medication Dose Route Frequency Provider Last Rate Last Dose  . 0.9 %  sodium chloride infusion   Intravenous PRN Michael Boston, MD 10 mL/hr at 11/20/18 1852 250 mL at 11/20/18 1852  . acetaminophen (TYLENOL) tablet 1,000 mg  1,000 mg Oral Lajuana Ripple, MD   1,000 mg at 11/22/18 1242  . alum & mag hydroxide-simeth (MAALOX/MYLANTA) 200-200-20 MG/5ML suspension 30 mL  30 mL Oral Q6H PRN Michael Boston, MD      . cholecalciferol (VITAMIN D3) tablet 2,000 Units  2,000 Units Oral Daily Michael Boston, MD   2,000 Units at 11/22/18 1006  . diphenhydrAMINE (BENADRYL) 12.5 MG/5ML elixir 12.5 mg  12.5 mg Oral Q6H PRN Michael Boston, MD       Or  . diphenhydrAMINE (BENADRYL) injection 12.5 mg  12.5 mg Intravenous Q6H PRN Michael Boston, MD      . enoxaparin (LOVENOX) injection 40 mg  40 mg Subcutaneous Q24H Michael Boston, MD   40 mg at 11/22/18 0827  . feeding supplement (ENSURE SURGERY) liquid 237 mL  237 mL Oral BID BM Michael Boston, MD   237 mL at 11/22/18 1500  . gabapentin (NEURONTIN) capsule 200 mg  200 mg Oral TID Michael Boston, MD   200 mg at 11/22/18 1502  . hydrALAZINE (APRESOLINE) injection 10 mg  10 mg Intravenous Q2H PRN Michael Boston, MD      . HYDROmorphone (DILAUDID) injection 0.5-2 mg  0.5-2 mg Intravenous Q4H PRN  Michael Boston, MD      . lip balm (CARMEX) ointment 1 application  1 application Topical BID Michael Boston, MD   1 application at [id number]  . magic mouthwash  15 mL Oral QID PRN Michael Boston, MD      . metoprolol tartrate (LOPRESSOR) injection 5 mg  5 mg Intravenous Q6H PRN Michael Boston, MD      . multivitamin with minerals tablet 1 tablet  1 tablet Oral Daily Michael Boston, MD   1 tablet at 11/22/18 1006  .  ondansetron (ZOFRAN) tablet 4 mg  4 mg Oral Q6H PRN Michael Boston, MD       Or  . ondansetron [facility]) injection 4 mg  4 mg Intravenous Q6H PRN Michael Boston, MD      . prochlorperazine (COMPAZINE) tablet 10 mg  10 mg Oral Q6H PRN Michael Boston, MD       Or  . prochlorperazine (COMPAZINE) injection 5-10 mg  5-10 mg Intravenous Q6H PRN Michael Boston, MD      . saccharomyces boulardii (FLORASTOR) capsule 250 mg  250 mg Oral BID Michael Boston, MD   250 mg at 11/22/18 1006  . traMADol (ULTRAM) tablet 50-100 mg  50-100 mg Oral Q6H PRN Michael Boston, MD   50 mg at 11/22/18 0601  . vitamin C (ASCORBIC ACID) tablet 1,000 mg  1,000 mg Oral Daily Michael Boston, MD   1,000 mg at 11/22/18 1006  . zinc sulfate capsule 220 mg  220 mg Oral Daily Michael Boston, MD   220 mg at 11/22/18 1500     Allergies  Allergen Reactions  . Poison Ivy Extract Itching and Rash    BP 128/66 (BP Location: Right Arm)   Pulse 76   Temp 98 F (36.7 C) (Oral)   Resp 15   Ht 5\' 11"  (1.803 m)   Wt 63.3 kg   SpO2 100%   BMI 19.46 kg/m   Ct Chest W Contrast  Result Date: 11/08/2018 CLINICAL DATA:  History of colon cancer.  Surgery in 2 weeks. EXAM: CT CHEST WITH CONTRAST TECHNIQUE: Multidetector CT imaging of the chest was performed during intravenous contrast administration. CONTRAST:  29mL ISOVUE-300 IOPAMIDOL (ISOVUE-300) INJECTION 61% COMPARISON:  None. FINDINGS: Cardiovascular: The ascending thoracic aorta demonstrates no aneurysm. There are scattered mild to moderate atherosclerotic changes. No dissection.  The central pulmonary arteries are normal in caliber. Coronary artery calcifications affect right and left coronary arteries. Mediastinum/Nodes: No enlarged mediastinal, hilar, or axillary lymph nodes. Thyroid gland, trachea, and esophagus demonstrate no significant findings. Lungs/Pleura: Lungs are clear. No pleural effusion or pneumothorax. Upper Abdomen: There are probable cysts in the liver with the largest seen on series 2, image 149, unchanged too small to completely characterize. The gallbladder is normal. Visualized portions of the portal vein are normal. The patient's known mass in the ascending colon is only partially imaged on this study. See the CT scan of the abdomen for further characterization of the upper abdomen. Musculoskeletal: No bony metastatic disease identified. IMPRESSION: 1. No metastatic disease identified in the chest. 2. Atherosclerotic changes in the nonaneurysmal aorta. Coronary artery calcifications. 3. The patient's known ascending colon mass is only partially characterized on this study. See the previous CT of the abdomen from September 17, 2018 for further characterization. Aortic Atherosclerosis (ICD10-I70.0). Electronically Signed   By: Dorise Bullion III M.D   On: 11/08/2018 15:56   Dg Hip Unilat With Pelvis 2-3 Views Left  Result Date: 11/22/2018 CLINICAL DATA:  [place] 2 weeks ago. Persistent left groin and lateral hip pain. EXAM: DG HIP (WITH OR WITHOUT PELVIS) 2-3V LEFT COMPARISON:  06/25/2014 FINDINGS: No evidence of fracture or dislocation. Possible soft tissue swelling in the gluteal region. Multiple prostate seed implants as seen previously. IMPRESSION: No acute or traumatic bone or joint finding. Possible soft tissue swelling in the left gluteal region. Electronically Signed   By: Nelson Chimes M.D.   On: 11/22/2018 09:16

## 2018-11-22 NOTE — TOC Progression Note (Addendum)
Transition of Care [facility]) - Progression Note    Patient Details  Name: DYLON JERMAN MRN: [id number] Date of Birth: March 09, 1941  Transition of Care [facility]) CM/SW Contact  Leeroy Cha, RN Phone Number: 11/22/2018, 10:45 AM  Clinical Narrative:    DME -rollinmg walker through [employer] ordered at 1045. hhc-All area providers have refused the insurance [employer]. For [facility]. Note to Dr. Johney Maine sent.  Expected Discharge Plan: [place] Barriers to Discharge: Inadequate or no insurance, Other (comment)  Expected Discharge Plan and Services Expected Discharge Plan: [place]   Discharge Planning Services: CM Consult Post Acute Care Choice: Durable Medical Equipment, Home Health Living arrangements for the past 2 months: Single Family Home                 DME Arranged: Walker rolling DME Agency: [employer] Date DME Agency Contacted: 11/22/18 Time DME Agency Contacted: [phone] Representative spoke with at DME Agency: [place]: PT           Social Determinants of Health ([place]) Interventions    Readmission Risk Interventions No flowsheet data found.

## 2018-11-22 NOTE — Consult Note (Signed)
Reason for Consult: Left hip pain Referring Physician: Dr. Alric Ran Vigna is an 78 y.o. male.  HPI: The patient is a very pleasant 78 year old gentleman who I have been asked to see as a relates to left hip pain.  He has been in the hospital recovering from colon cancer surgery performed by Dr. Johney Maine.  While he has been getting up with therapy for mobility he has been complaining of left hip pain.  Some of this is over the trochanteric area and it is global.  He does have a history of neuropathy and a weak left lower extremity having had previous back surgery in the past as well.  He does report that he had a significant mechanical fall about 2 weeks ago landing on his left hip.  Plain films were obtained today of his pelvis and his left hip and there is no acute injury that is seen on these films and I have reviewed them myself as well.  Given his history of colon cancer I did not see any worrisome features around the pelvis or the hips on plain film to suggest any type of lesions.  Past Medical History:  Diagnosis Date  . Arthritis    knees  . Colon cancer ([place]) 09/24/2018  . ED (erectile dysfunction)   . 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 Peripheral vascular disease ([place])    poor curculation in hands and feet hard to monitor with pulse oximetry  . Prostate cancer [facility])    sees Dr. Gaynelle Arabian, received cesium seeds     Past Surgical History:  Procedure Laterality Date  . BACK SURGERY    . SPINE SURGERY  2004   Dr. Sherwood Gambler  . TONSILLECTOMY      Family History  Problem Relation Age of Onset  . Breast cancer Other     Social History:  reports that he has never smoked. He has never used smokeless tobacco. He reports current alcohol use of about 1.0 standard drinks of alcohol per week. He reports that he does not use drugs.  Allergies:  Allergies  Allergen Reactions  . Poison Ivy Extract Itching and Rash    Medications: I have reviewed the  patient's current medications.  Results for orders placed or performed during the hospital encounter of 11/20/18 (from the past 48 hour(s))  Basic metabolic panel     Status: Abnormal   Collection Time: 11/21/18  3:00 AM  Result Value Ref Range   Sodium 138 135 - 145 mmol/L   Potassium 3.7 3.5 - 5.1 mmol/L   Chloride 105 98 - 111 mmol/L   CO2 25 22 - 32 mmol/L   Glucose, Bld 111 (H) 70 - 99 mg/dL   BUN 13 8 - 23 mg/dL   Creatinine, Ser 0.98 0.61 - 1.24 mg/dL   Calcium 7.8 (L) 8.9 - 10.3 mg/dL   GFR calc non Af Amer >60 >60 mL/min   GFR calc Af Amer >60 >60 mL/min   Anion gap 8 5 - 15    Comment: Performed at [facility], [place] [address], [place], [place] [address]  CBC     Status: Abnormal   Collection Time: 11/21/18  3:00 AM  Result Value Ref Range   WBC 11.4 (H) 4.0 - 10.5 K/uL   RBC 2.64 (L) 4.22 - 5.81 MIL/uL   Hemoglobin 6.6 (LL) 13.0 - 17.0 g/dL    Comment: This critical result has verified and been called to  BISHMU,RN by Rozelle Logan on 08 27 2020 at [place], and has been read back. CRITICAL RESULTS VERIFIED   HCT 22.5 (L) 39.0 - 52.0 %   MCV 85.2 80.0 - 100.0 fL   MCH 25.0 (L) 26.0 - 34.0 pg   MCHC 29.3 (L) 30.0 - 36.0 g/dL   RDW 15.5 11.5 - 15.5 %   Platelets 380 150 - 400 K/uL   nRBC 0.0 0.0 - 0.2 %    Comment: Performed at [facility], [place] [address] [place], [place] [address]  Magnesium     Status: None   Collection Time: 11/21/18  3:00 AM  Result Value Ref Range   Magnesium 2.2 1.7 - 2.4 mg/dL    Comment: Performed at [facility], [place] [address], [place], [place] [address]  Prepare RBC     Status: None   Collection Time: 11/21/18  7:15 AM  Result Value Ref Range   Order Confirmation      ORDER PROCESSED BY BLOOD BANK Performed at [place] [address], [place], [place] [address]   Hemoglobin     Status: Abnormal   Collection Time: 11/22/18  8:01 AM  Result Value Ref Range    Hemoglobin 8.7 (L) 13.0 - 17.0 g/dL    Comment: POST TRANSFUSION SPECIMEN Performed at [place] [address] [place], [place] [address]   Creatinine, serum     Status: None   Collection Time: 11/22/18  8:01 AM  Result Value Ref Range   Creatinine, Ser 0.79 0.61 - 1.24 mg/dL   GFR calc non Af Amer >60 >60 mL/min   GFR calc Af Amer >60 >60 mL/min    Comment: Performed at [facility], [place] [address] [place], [place] [address]  Potassium     Status: None   Collection Time: 11/22/18  8:01 AM  Result Value Ref Range   Potassium 3.7 3.5 - 5.1 mmol/L    Comment: Performed at [facility], [place] [address], [place], [place] [address]    Dg Hip Unilat With Pelvis 2-3 Views Left  Result Date: 11/22/2018 CLINICAL DATA:  [place] 2 weeks ago. Persistent left groin and lateral hip pain. EXAM: DG HIP (WITH OR WITHOUT PELVIS) 2-3V LEFT COMPARISON:  06/25/2014 FINDINGS: No evidence of fracture or dislocation. Possible soft tissue swelling in the gluteal region. Multiple prostate seed implants as seen previously. IMPRESSION: No acute or traumatic bone or joint finding. Possible soft tissue swelling in the left gluteal region. Electronically Signed   By: Nelson Chimes M.D.   On: 11/22/2018 09:16    ROS Blood pressure 128/66, pulse 76, temperature 98 F (36.7 C), temperature source Oral, resp. rate 15, height 5\' 11"  (1.803 m), weight 63.3 kg, SpO2 100 %. Physical Exam  Constitutional: He is oriented to person, place, and time. He appears well-developed and well-nourished.  HENT:  Head: Normocephalic and atraumatic.  Cardiovascular: Normal rate.  Respiratory: Effort normal.  GI: Soft.  Musculoskeletal:     Left hip: He exhibits bony tenderness.  Neurological: He is alert and oriented to person, place, and time.  Skin: Skin is warm and dry.  Psychiatric: He has a normal mood and affect.   I am able to put his left hip through full  internal and external rotation with no significant discomfort.  There is slight discomfort to palpation of the trochanteric area but this is only mild.  There is no bruising in this  area that I can see.  I can compress his hip and compress his pelvis and this does not elicit any type of significant pain at all.  His knee exam is normal.  He does have some slight weakness in his left foot but he does not have foot drop.  His ankle is stable on exam.   Assessment/Plan: Left hip pain status post mechanical fall  I gave him reassurance that we do not see anything broken and that hopefully he will improve with time.  Certainly mobility with physical therapy and using a walker is important if he is a fall risk.  We had a long and thorough discussion about this.  All question concerns were answered and addressed.  Follow-up will be as needed.  Mcarthur Rossetti 11/22/2018, 1:47 PM

## 2018-11-22 NOTE — Progress Notes (Signed)
Pharmacy Brief Note - Alvimopan (Entereg)  The standing order set for alvimopan (Entereg) now includes an automatic order to discontinue the drug after the patient has had a bowel movement. The change was approved by the Pharmacy & Therapeutics Committee and the Medical Executive Committee.  This patient has had a bowel movement documented by nursing. Therefore, alvimopan has been discontinued. If there are questions, please contact the pharmacy at [phone].  Thank you    

## 2018-11-22 NOTE — Progress Notes (Signed)
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  Transfused 1 unit packed red cells yesterday.  Hemoglobin back into 8s.  Feeling better.  Already received IV iron this admission.  ERAS enhanced recovery pathway.  D/C Foley  Follow-up pathology.  Still with some persistent left-sided hip pain.  He requested orthopedic consultation.  Some challenge mobility with physical therapy.  Discussed with Dr. Zollie Beckers who is on consultation today.  Plain films ordered.  Occupational Therapy evaluate to see if he would benefit from home health or further intervention is needed.  Patient's wife is in the hospital here.  Planning to have diagnostic laparoscopy with probable appendectomy.  Concern of recurrent appendicitis.  Patient notes his daughter & family are around to help take care of them  -VTE prophylaxis- SCDs, etc  -mobilize as tolerated to  help recovery  D/C patient from hospital when patient meets criteria (anticipate later today):  Cleared by orthopedics with follow-up plan. Tolerating oral intake well Ambulating well Adequate pain control without IV medications Urinating  Having flatus Disposition planning in place -so far home health PT ordered   20 minutes spent in review, evaluation, examination, counseling, and coordination of care.  More than 50% of that time was spent in counseling.  11/22/2018    Subjective: (Chief complaint)  Having flatus and bowel movements.  Denies much abdominal pain.  Still has persistent left hip pain when he gets up and walks around.  Concerned given his fall from 2 weeks ago.  Objective:  Vital signs:  Vitals:   11/21/18 1258 11/21/18 2058 11/22/18 0500 11/22/18 0557  BP: (!) 105/55 110/71  121/70  Pulse: 73 72  77  Resp: 16 14  14   Temp: 98 F (36.7 C) 98 F (36.7 C)  98.2 F (36.8 C)  TempSrc: Oral Oral  Oral  SpO2: 100% 99%  99%  Weight:   63.3 kg   Height:        Last BM Date: 11/21/18  Intake/Output   Yesterday:  08/27 0701 - 08/28 0700 In: 1853 [P.O.:1200; I.V.:23; Blood:630] Out: 2351 [Urine:2351] This shift:  No intake/output data recorded.  Bowel function:  Flatus: YES  BM:  YES  Drain: (No drain)   Physical Exam:  General: Pt awake/alert/oriented x4 in no acute distress Eyes: PERRL, normal EOM.  Sclera clear.  No icterus Neuro: CN II-XII intact w/o focal sensory/motor deficits. Lymph: No head/neck/groin lymphadenopathy Psych:  No delerium/psychosis/paranoia HENT: Normocephalic, Mucus membranes moist.  No thrush Neck: Supple, No tracheal  deviation Chest:  No chest wall pain w good excursion CV:  Pulses intact.  Regular rhythm MS: Normal AROM mjr joints.  No obvious deformity  Abdomen: Soft.  Nondistended.  Mildly tender at incisions only.  No evidence of peritonitis.  No incarcerated hernias.  Ext: Mild tenderness to palpation  along left lateral hip.  No ecchymosis or crepitus.  Normal passive range of motion.  No deformity.  No mjr edema.  No cyanosis Skin: No petechiae / purpura  Results:   Cultures: Recent Results (from the past 720 hour(s))  SARS CORONAVIRUS 2 Nasal Swab Aptima Multi Swab     Status: None   Collection Time: 11/16/18 10:26 AM   Specimen: Aptima Multi Swab; Nasal Swab  Result Value Ref Range Status   SARS Coronavirus 2 NEGATIVE NEGATIVE Final    Comment: (NOTE) SARS-CoV-2 target nucleic acids are NOT DETECTED. The SARS-CoV-2 RNA is generally detectable in upper and lower respiratory specimens during the acute phase of infection. Negative results do not preclude SARS-CoV-2 infection, do not rule out co-infections with other pathogens, and should not be used as the sole basis for treatment or other patient management decisions. Negative results must be combined with clinical observations, patient history, and epidemiological information. The expected result is Negative. Fact Sheet for Patients: SugarRoll.be Fact Sheet for Healthcare Providers: https://www.woods-mathews.com/ This test is not yet approved or cleared by the [place] FDA and  has been authorized for detection and/or diagnosis of SARS-CoV-2 by FDA under an Emergency Use Authorization (EUA). This EUA will remain  in effect (meaning this test can be used) for the duration of the COVID-19 declaration under Section 56 4(b)(1) of the Act, 21 U.S.C. section 360bbb-3(b)(1), unless the authorization is terminated or revoked sooner. Performed at [facility], [place] [address], [place], [place] [address]     Labs: Results for orders placed or performed during the hospital encounter of 11/20/18 (from the past 48 hour(s))  Basic metabolic panel     Status: Abnormal   Collection Time: 11/21/18  3:00 AM  Result Value Ref Range   Sodium 138 135 - 145 mmol/L   Potassium 3.7 3.5 - 5.1  mmol/L   Chloride 105 98 - 111 mmol/L   CO2 25 22 - 32 mmol/L   Glucose, Bld 111 (H) 70 - 99 mg/dL   BUN 13 8 - 23 mg/dL   Creatinine, Ser 0.98 0.61 - 1.24 mg/dL   Calcium 7.8 (L) 8.9 - 10.3 mg/dL   GFR calc non Af Amer >60 >60 mL/min   GFR calc Af Amer >60 >60 mL/min   Anion gap 8 5 - 15    Comment: Performed at [facility], [place] [address], [place], [place] [address]  CBC     Status: Abnormal   Collection Time: 11/21/18  3:00 AM  Result Value Ref Range   WBC 11.4 (H) 4.0 - 10.5 K/uL   RBC 2.64 (L) 4.22 - 5.81 MIL/uL   Hemoglobin 6.6 (LL) 13.0 - 17.0 g/dL    Comment: This critical result has verified and been called to [facility] by Rozelle Logan on 08 27 2020 at [place], and has been read back. CRITICAL RESULTS VERIFIED   HCT 22.5 (L) 39.0 - 52.0 %   MCV 85.2 80.0 - 100.0 fL   MCH 25.0 (L) 26.0 - 34.0 pg   MCHC 29.3 (L) 30.0 - 36.0 g/dL   RDW 15.5 11.5 - 15.5 %   Platelets 380 150 - 400  K/uL   nRBC 0.0 0.0 - 0.2 %    Comment: Performed at [facility], [place] [address], [place], [place] [address]  Magnesium     Status: None   Collection Time: 11/21/18  3:00 AM  Result Value Ref Range   Magnesium 2.2 1.7 - 2.4 mg/dL    Comment: Performed at [facility], [place] [address] [place], [place] [address]  Prepare RBC     Status: None   Collection Time: 11/21/18  7:15 AM  Result Value Ref Range   Order Confirmation      ORDER PROCESSED BY BLOOD BANK Performed at [place] [address], [place], [place] [address]     Imaging / Studies: No results found.  Medications / Allergies: per chart  Antibiotics: Anti-infectives (From admission, onward)   Start     Dose/Rate Route Frequency Ordered Stop   11/20/18 1800  cefoTEtan (CEFOTAN) 2 g in sodium chloride 0.9 % 100 mL IVPB     2 g 200 mL/hr over 30 Minutes Intravenous Every 12 hours 11/20/18 1231 11/20/18 1823   11/20/18 1400  neomycin (MYCIFRADIN)  tablet 1,000 mg  Status:  Discontinued     1,000 mg Oral 3 times per day 11/20/18 0637 11/20/18 0649   11/20/18 1400  metroNIDAZOLE (FLAGYL) tablet 1,000 mg  Status:  Discontinued     1,000 mg Oral 3 times per day 11/20/18 0637 11/20/18 0649   11/20/18 1126  clindamycin (CLEOCIN) 900 mg, gentamicin (GARAMYCIN) 240 mg in sodium chloride 0.9 % 1,000 mL for intraperitoneal lavage  Status:  Discontinued       As needed 11/20/18 1127 11/20/18 1145   11/20/18 0645  cefoTEtan (CEFOTAN) 2 g in sodium chloride 0.9 % 100 mL IVPB     2 g 200 mL/hr over 30 Minutes Intravenous On call to O.R. 11/20/18 [id number] 11/20/18 0851   11/20/18 0600  clindamycin (CLEOCIN) 900 mg, gentamicin (GARAMYCIN) 240 mg in sodium chloride 0.9 % 1,000 mL for intraperitoneal lavage  Status:  Discontinued      Irrigation To Surgery 11/19/18 0749 11/20/18 1231        Note: Portions of this report may have been transcribed using voice recognition software. Every effort was made to ensure accuracy; however, inadvertent computerized transcription errors may be present.   Any transcriptional errors that result from this process are unintentional.     [name], MD, FACS, MASCRS Gastrointestinal and Minimally Invasive Surgery    [address], [place], [place] [address] [phone] Main / Paging [phone] Fax

## 2018-11-22 NOTE — Discharge Summary (Signed)
Physician Discharge Summary    Patient ID: JAKSYN FESER MRN: [id number] DOB/AGE: 03-Nov-1940  78 y.o.  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  No evidence of metastatic disease.  The patient underwent the surgery above.  Postoperatively, the patient gradually mobilized and advanced to a solid diet.  Pain and other symptoms were treated aggressively.  Patient did have complaint of left hip pain persistent after a fall at home 2 weeks ago.  Orthopedic was consulted since he was having some discomfort.  Plain films negative for obvious fracture.  Dr. Ninfa Linden evaluated and was not concerned about any major joint issue.  Felt it would resolve with gradual increase mobility seen by physical therapy and felt to benefit from home health physical therapy & occupational therapy evaluation.  May have to be outpatient physical therapy  By the time of discharge, the patient was walking OK in the hallways with a walker, eating food, having flatus.  Pain was well-controlled on an oral medications.  Based on meeting discharge criteria and continuing to recover, I felt it was safe for the patient to be discharged from the hospital to further  recover with close followup. Postoperative recommendations were discussed in detail.  They are written as well.  Discharged Condition: good  Discharge Exam: Blood pressure 121/70, pulse 77, temperature 98.2 F (36.8 C), temperature source Oral, resp. rate 14, height 5\' 11"  (1.803 m), weight 63.3 kg, SpO2 99 %.  General: Pt awake/alert/oriented x4 in No acute distress Eyes: PERRL, normal EOM.  Sclera clear.  No icterus Neuro: CN II-XII intact w/o focal sensory/motor deficits. Lymph: No head/neck/groin lymphadenopathy Psych:  No delerium/psychosis/paranoia HENT: Normocephalic, Mucus membranes moist.  No thrush Neck: Supple, No tracheal deviation Chest:  No chest wall pain w good excursion CV:  Pulses intact.  Regular rhythm MS: Normal AROM mjr joints.  No obvious deformity Abdomen: Soft.  Nondistended.  Mildly tender at incisions only.  No evidence of peritonitis.  No incarcerated hernias. Ext:  SCDs BLE.  No mjr edema.  No cyanosis.  Mild soreness along left lateral hip.  No obvious crepitus.  No decreased passive range of motion Skin: No petechiae / purpura   Disposition:   Follow-up Information    Michael Boston, MD. Schedule an appointment as soon as possible for a visit in 3 weeks.   Specialty: General Surgery Why: To follow up after your operation, To follow up after your hospital stay Contact information: [place] [address] [phone]             Discharge Instructions    Call MD for:   Complete by: As directed    FEVER > 101.5 F  (temperatures < 101.5 F are not significant)  Call MD for:  extreme fatigue   Complete by: As directed    Call MD for:  persistant dizziness or light-headedness   Complete by: As directed    Call MD for:  persistant nausea and vomiting   Complete by: As directed    Call MD for:  redness, tenderness, or signs of infection (pain, swelling, redness, odor or green/yellow discharge around incision site)    Complete by: As directed    Call MD for:  severe uncontrolled pain   Complete by: As directed    Diet - low sodium heart healthy   Complete by: As directed    Start with a bland diet such as soups, liquids, starchy foods, low fat foods, etc. the first few days at home. Gradually advance to a solid, low-fat, high fiber diet by the end of the first week at home.   Add a fiber supplement to your diet (Metamucil, etc) If you feel full, bloated, or constipated, stay on a full liquid or pureed/blenderized diet for a few days until you feel better and are no longer constipated.   Discharge instructions   Complete by: As directed    See Discharge Instructions If you are not getting better after two weeks or are noticing you are getting worse, contact our office [phone] for further advice.  We may need to adjust your medications, re-evaluate you in the office, send you to the emergency room, or see what other things we can do to help. The clinic staff is available to answer your questions during regular business hours (8:30am-5pm).  Please don't hesitate to call and ask to speak to one of our nurses for clinical concerns.    A surgeon from [facility] is always on call at the hospitals 24 hours/day If you have a medical emergency, go to the nearest emergency room or call 911.   Discharge wound care:   Complete by: As directed    It is good for closed incisions and even open wounds to be washed every day.  Shower every day.  Short baths are fine.  Wash the incisions and wounds clean with soap & water.    You may leave closed incisions open to air if it is dry.   You may cover the incision with clean gauze & replace it after your daily shower for comfort.   Driving Restrictions   Complete by: As directed    You may drive when: - you are no longer taking narcotic prescription pain medication - you can comfortably wear a seatbelt - you can safely make sudden turns/stops without pain.    Increase activity slowly   Complete by: As directed    Start light daily activities --- self-care, walking, climbing stairs- beginning the day after surgery.  Gradually increase activities as tolerated.  Control your pain to be active.  Stop when you are tired.  Ideally, walk several times a day, eventually an hour a day.   Most people are back to most day-to-day activities in a few weeks.  It takes 4-6 weeks to get back to unrestricted, intense activity. If you can walk 30 minutes without difficulty, it is safe to try more intense activity such as jogging, treadmill, bicycling, low-impact aerobics, swimming, etc. Save the most intensive and strenuous activity for last (Usually 4-8 weeks after surgery) such as sit-ups, heavy lifting, contact sports, etc.  Refrain from any intense heavy lifting or straining until you are off narcotics for pain control.  You  will have off days, but things should improve week-by-week. DO NOT PUSH THROUGH PAIN.  Let pain be your guide: If it hurts to do something, don't do it.   Lifting restrictions   Complete by: As directed    If you can walk 30 minutes without difficulty, it is safe to try more intense activity such as jogging, treadmill, bicycling, low-impact aerobics, swimming, etc. Save the most intensive and strenuous activity for last (Usually 4-8 weeks after surgery) such as sit-ups, heavy lifting, contact sports, etc.   Refrain from any intense heavy lifting or straining until you are off narcotics for pain control.  You will have off days, but things should improve week-by-week. DO NOT PUSH THROUGH PAIN.  Let pain be your guide: If it hurts to do something, don't do it.  Pain is your body warning you to avoid that activity for another week until the pain goes down.   May shower / Bathe   Complete by: As directed    May walk up steps   Complete by: As directed    Remove dressing in 72 hours   Complete by: As directed    Make sure all dressings are removed  by the third day after surgery.  Leave incisions open to air.  OK to cover incisions with gauze or bandages as desired   Sexual Activity Restrictions   Complete by: As directed    You may have sexual intercourse when it is comfortable. If it hurts to do something, stop.      Allergies as of 11/22/2018      Reactions   Poison Ivy Extract Itching, Rash      Medication List    TAKE these medications   FISH OIL PO Take 2,000 mg by mouth daily.   ibuprofen 200 MG tablet Commonly known as: ADVIL Take 400 mg by mouth 3 (three) times daily as needed for moderate pain.   multivitamin with minerals tablet Take 1 tablet by mouth daily.   SUPER B COMPLEX PO Take 1 tablet by mouth daily.   Turmeric Curcumin 500 MG Caps Take 500 mg by mouth daily.   vitamin C 1000 MG tablet Take 1,000 mg by mouth daily.   Vitamin D 50 MCG (2000 UT) Caps Take 2,000 Units by mouth daily.   vitamin E 400 UNIT capsule Take 400 Units by mouth daily.   zinc gluconate 50 MG tablet Take 50 mg by mouth daily.            Durable Medical Equipment  (From admission, onward)         Start     Ordered   11/22/18 0731  For home use only DME Walker rolling  Once    Comments: To help patient transfer and ambulate.  Physical / Occupational Therapy may change type of walker PRN.  Question:  Patient needs a walker to treat with the following condition  Answer:  Balance problem   11/22/18 0730           Discharge Care Instructions  (From admission, onward)         Start     Ordered   11/22/18 0000  Discharge wound care:    Comments: It is good for closed incisions and even open wounds to be washed every day.  Shower every day.  Short baths are fine.  Wash the incisions and wounds clean with soap & water.    You may leave closed incisions open to air if it  is dry.   You may cover the incision with clean gauze & replace it after your daily shower for comfort.   11/22/18 1554   11/20/18 0000   Discharge wound care:    Comments: It is good for closed incisions and even open wounds to be washed every day.  Shower every day.  Short baths are fine.  Wash the incisions and wounds clean with soap & water.    You may leave closed incisions open to air if it is dry.   You may cover the incision with clean gauze & replace it after your daily shower for comfort.   11/20/18 1151          Significant Diagnostic Studies:  Results for orders placed or performed during the hospital encounter of 11/20/18 (from the past 72 hour(s))  Basic metabolic panel     Status: Abnormal   Collection Time: 11/21/18  3:00 AM  Result Value Ref Range   Sodium 138 135 - 145 mmol/L   Potassium 3.7 3.5 - 5.1 mmol/L   Chloride 105 98 - 111 mmol/L   CO2 25 22 - 32 mmol/L   Glucose, Bld 111 (H) 70 - 99 mg/dL   BUN 13 8 - 23 mg/dL   Creatinine, Ser 0.98 0.61 - 1.24 mg/dL   Calcium 7.8 (L) 8.9 - 10.3 mg/dL   GFR calc non Af Amer >60 >60 mL/min   GFR calc Af Amer >60 >60 mL/min   Anion gap 8 5 - 15    Comment: Performed at [facility], [place] [address] [place], [place] [address]  CBC     Status: Abnormal   Collection Time: 11/21/18  3:00 AM  Result Value Ref Range   WBC 11.4 (H) 4.0 - 10.5 K/uL   RBC 2.64 (L) 4.22 - 5.81 MIL/uL   Hemoglobin 6.6 (LL) 13.0 - 17.0 g/dL    Comment: This critical result has verified and been called to [facility] by Rozelle Logan on 08 27 2020 at [place], and has been read back. CRITICAL RESULTS VERIFIED   HCT 22.5 (L) 39.0 - 52.0 %   MCV 85.2 80.0 - 100.0 fL   MCH 25.0 (L) 26.0 - 34.0 pg   MCHC 29.3 (L) 30.0 - 36.0 g/dL   RDW 15.5 11.5 - 15.5 %   Platelets 380 150 - 400 K/uL   nRBC 0.0 0.0 - 0.2 %    Comment: Performed at [facility], [place] [address] [place], [place] [address]  Magnesium     Status: None   Collection Time: 11/21/18  3:00 AM  Result Value Ref Range   Magnesium 2.2 1.7 - 2.4 mg/dL    Comment: Performed at [facility], [place] [address] [place], [place] [address]  Prepare RBC     Status: None   Collection Time: 11/21/18  7:15 AM  Result Value Ref Range   Order Confirmation      ORDER PROCESSED BY BLOOD BANK Performed at [place] [address], [place], [place] [address]   Hemoglobin     Status: Abnormal   Collection Time: 11/22/18  8:01 AM  Result Value Ref Range   Hemoglobin 8.7 (L) 13.0 - 17.0 g/dL    Comment: POST TRANSFUSION SPECIMEN Performed at [place] [address] [place], [place] [address]   Creatinine, serum     Status: None   Collection Time: 11/22/18  8:01 AM  Result Value Ref  Range   Creatinine, Ser 0.79 0.61 - 1.24 mg/dL   GFR calc non Af Amer >60 >60 mL/min   GFR calc Af Amer >60 >60 mL/min    Comment: Performed at [facility], [place] [address] [place], [place] [address]  Potassium     Status: None   Collection Time: 11/22/18  8:01 AM  Result Value Ref Range   Potassium 3.7 3.5 - 5.1 mmol/L    Comment: Performed at [facility], [place] [address], [place], [place] [address]    No results found.  Past Medical History:  Diagnosis Date  . Arthritis    knees  . Colon cancer ([place]) 09/24/2018  . ED (erectile dysfunction)   . 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 Peripheral vascular disease ([place])    poor curculation in hands and feet hard to monitor with pulse oximetry  . Prostate cancer [facility])    sees Dr. Gaynelle Arabian, received cesium seeds     Past Surgical History:  Procedure Laterality Date  . BACK SURGERY    . SPINE SURGERY  2004   Dr. Sherwood Gambler  . TONSILLECTOMY      Social History   Socioeconomic History  . Marital status: Married    Spouse name: Not on file  . Number of children: Not on file  . Years of education: Not on file  . Highest education level: Not on file  Occupational History  . Not on file  Social Needs  . Financial resource  strain: Not on file  . Food insecurity    Worry: Not on file    Inability: Not on file  . Transportation needs    Medical: Not on file    Non-medical: Not on file  Tobacco Use  . Smoking status: Never Smoker  . Smokeless tobacco: Never Used  Substance and Sexual Activity  . Alcohol use: Yes    Alcohol/week: 1.0 standard drinks    Types: 1 Glasses of wine per week  . Drug use: No  . Sexual activity: Not on file  Lifestyle  . Physical activity    Days per week: Not on file    Minutes per session: Not on file  . Stress: Not on file  Relationships  . Social Herbalist on phone: Not on file    Gets together: Not on file    Attends religious service: Not on file    Active member of club or organization: Not on file    Attends meetings of clubs or organizations: Not on file    Relationship status: Not on file  . Intimate partner violence    Fear of current or ex partner: Not on file    Emotionally abused: Not on file    Physically abused: Not on file    Forced sexual activity: Not on file  Other Topics Concern  . Not on file  Social History Narrative  . Not on file    Family History  Problem Relation Age of Onset  . Breast cancer Other     Current Facility-Administered Medications  Medication Dose Route Frequency Provider Last Rate Last Dose  . 0.9 %  sodium chloride infusion (Manually program via Guardrails IV Fluids)   Intravenous Once Michael Boston, MD      . 0.9 %  sodium chloride infusion   Intravenous Q8H PRN Lynne Takemoto, Remo Lipps, MD      . 0.9 %  sodium chloride infusion  Intravenous PRN Michael Boston, MD 10 mL/hr at 11/20/18 1852 250 mL at 11/20/18 1852  . acetaminophen (TYLENOL) tablet 1,000 mg  1,000 mg Oral Lajuana Ripple, MD   1,000 mg at 11/22/18 0540  . alum & mag hydroxide-simeth (MAALOX/MYLANTA) 200-200-20 MG/5ML suspension 30 mL  30 mL Oral Q6H PRN Michael Boston, MD      . alvimopan (ENTEREG) capsule 12 mg  12 mg Oral BID Michael Boston, MD   12 mg  at 11/21/18 0943  . cholecalciferol (VITAMIN D3) tablet 2,000 Units  2,000 Units Oral Daily Michael Boston, MD   2,000 Units at 11/21/18 [phone]  . diphenhydrAMINE (BENADRYL) 12.5 MG/5ML elixir 12.5 mg  12.5 mg Oral Q6H PRN Michael Boston, MD       Or  . diphenhydrAMINE (BENADRYL) injection 12.5 mg  12.5 mg Intravenous Q6H PRN Michael Boston, MD      . enoxaparin (LOVENOX) injection 40 mg  40 mg Subcutaneous Q24H Michael Boston, MD   40 mg at 11/22/18 0827  . feeding supplement (ENSURE SURGERY) liquid 237 mL  237 mL Oral BID BM Michael Boston, MD   237 mL at 11/21/18 0944  . gabapentin (NEURONTIN) capsule 200 mg  200 mg Oral TID Michael Boston, MD   200 mg at 11/21/18 2155  . hydrALAZINE (APRESOLINE) injection 10 mg  10 mg Intravenous Q2H PRN Michael Boston, MD      . HYDROmorphone (DILAUDID) injection 0.5-2 mg  0.5-2 mg Intravenous Q4H PRN Michael Boston, MD      . lip balm (CARMEX) ointment 1 application  1 application Topical BID Michael Boston, MD   1 application at [phone]  . magic mouthwash  15 mL Oral QID PRN Michael Boston, MD      . metoprolol tartrate (LOPRESSOR) injection 5 mg  5 mg Intravenous Q6H PRN Michael Boston, MD      . multivitamin with minerals tablet 1 tablet  1 tablet Oral Daily Michael Boston, MD   1 tablet at 11/21/18 [phone]  . ondansetron (ZOFRAN) tablet 4 mg  4 mg Oral Q6H PRN Michael Boston, MD       Or  . ondansetron [facility]) injection 4 mg  4 mg Intravenous Q6H PRN Michael Boston, MD      . prochlorperazine (COMPAZINE) tablet 10 mg  10 mg Oral Q6H PRN Michael Boston, MD       Or  . prochlorperazine (COMPAZINE) injection 5-10 mg  5-10 mg Intravenous Q6H PRN Michael Boston, MD      . saccharomyces boulardii (FLORASTOR) capsule 250 mg  250 mg Oral BID Michael Boston, MD   250 mg at 11/21/18 2155  . traMADol (ULTRAM) tablet 50-100 mg  50-100 mg Oral Q6H PRN Michael Boston, MD   50 mg at 11/22/18 0601  . vitamin C (ASCORBIC ACID) tablet 1,000 mg  1,000 mg Oral Daily Michael Boston, MD    1,000 mg at 11/21/18 0943  . zinc sulfate capsule 220 mg  220 mg Oral Daily Michael Boston, MD   220 mg at 11/21/18 0944     Allergies  Allergen Reactions  . Poison Ivy Extract Itching and Rash    Signed: Morton Peters, MD, FACS, MASCRS [facility]    [address] [place], [place] [address] [phone] Main / Paging [phone] Fax   11/22/2018, 9:48 AM

## 2018-11-22 NOTE — Care Management Important Message (Signed)
Important Message  Patient Details IM Letter given to Velva Harman RN to present to the Patient Name: Oscar Sparks MRN: [id number] Date of Birth: Dec 17, 1940   Medicare Important Message Given:  Yes     Kerin Salen 11/22/2018, 10:08 AM

## 2018-11-23 NOTE — Progress Notes (Signed)
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  Transfused 1 unit packed red cells 8/27.  Hemoglobin back into 8s.  Feeling better.  Already received IV iron this admission.  ERAS enhanced recovery pathway.  Follow-up pathology.  L hip discomfort - seen by Dr. Zollie Beckers. No fracture; ongoing therapies.  Patient's wife is in the hospital here.  Patient notes his daughter & family are around to help take care of them  -VTE prophylaxis- SCDs, etc  -mobilize as tolerated to help recovery  -Home health planning underway  D/C patient from hospital when patient meets criteria (anticipate later today):  Cleared by orthopedics with follow-up plan. Tolerating oral intake well Ambulating well Adequate pain control without IV medications Urinating  Having flatus Disposition planning in place -so far home health PT ordered   20 minutes spent in review, evaluation, examination, counseling, and coordination of care.  More than 50% of that time was spent in  counseling.  11/23/2018    Subjective: (Chief complaint)  Having flatus and bowel movements.  Denies much abdominal pain.  L hip less bothersome today  Objective:  Vital signs:  Vitals:   11/22/18 2004 11/22/18 2005 11/22/18 2007 11/23/18 0536  BP: 117/64 (!) 115/56 121/62 127/65  Pulse: 71 76 93 61  Resp: 17 18 18 16   Temp:  98.2 F (36.8 C)  98 F (36.7 C)  TempSrc:  Oral  Oral  SpO2: 97% 96% 95% 99%  Weight:      Height:        Last BM Date: 11/22/18  Intake/Output   Yesterday:  08/28 0701 - 08/29 0700 In: 1160 [P.O.:1160] Out: 1300 [Urine:1300] This shift:  No intake/output data recorded.  Bowel function:  Flatus: YES  BM:  YES  Drain: (No drain)   Physical Exam:  General: Pt awake/alert/oriented x3 in no acute distress Eyes: PERRL, normal EOM.  Sclera clear.  No icterus Chest:  No chest wall pain with good excursion CV:  RRR MS: Normal AROM mjr joints.  No obvious deformity  Abdomen: Soft.  Nondistended.  Mildly tender at incisions only.  No evidence of peritonitis.  No incarcerated hernias.  Ext: SCDs in place Skin: No petechiae / purpura  Results:   Cultures: Recent Results (from the past 720 hour(s))  SARS CORONAVIRUS 2 Nasal Swab Aptima Multi Swab     Status: None   Collection Time: 11/16/18 10:26 AM   Specimen: Aptima Multi Swab; Nasal Swab  Result Value Ref Range Status   SARS Coronavirus 2 NEGATIVE NEGATIVE Final    Comment: (NOTE) SARS-CoV-2 target nucleic acids are NOT DETECTED.  The SARS-CoV-2 RNA is generally detectable in upper and lower respiratory specimens during the acute phase of infection. Negative results do not preclude SARS-CoV-2 infection, do not rule out co-infections with other pathogens, and should not be used as the sole basis for treatment or other patient management decisions. Negative results must be combined with clinical observations, patient history, and epidemiological information. The expected result  is Negative. Fact Sheet for Patients: SugarRoll.be Fact Sheet for Healthcare Providers: https://www.woods-mathews.com/ This test is not yet approved or cleared by the [place] FDA and  has been authorized for detection and/or diagnosis of SARS-CoV-2 by FDA under an Emergency Use Authorization (EUA). This EUA will remain  in effect (meaning this test can be used) for the duration of the COVID-19 declaration under Section 56 4(b)(1) of the Act, 21 U.S.C. section 360bbb-3(b)(1), unless the authorization is terminated or revoked sooner. Performed at [facility], [place] [address], [place], [place] [address]     Labs: Results for orders placed or performed during the hospital encounter of 11/20/18 (from the past 48 hour(s))  Hemoglobin     Status: Abnormal   Collection Time: 11/22/18  8:01 AM  Result Value Ref Range   Hemoglobin 8.7 (L) 13.0 - 17.0 g/dL    Comment: POST TRANSFUSION SPECIMEN Performed at [facility] [address], [place], [place] [address]   Creatinine, serum     Status: None   Collection Time: 11/22/18  8:01 AM  Result Value Ref Range   Creatinine, Ser 0.79 0.61 - 1.24 mg/dL   GFR calc non Af Amer >60 >60 mL/min   GFR calc Af Amer >60 >60 mL/min    Comment: Performed at [facility], [place] [address] [place], [place] [address]  Potassium     Status: None   Collection Time: 11/22/18  8:01 AM  Result Value Ref Range   Potassium 3.7 3.5 - 5.1 mmol/L    Comment: Performed at [facility], [place] [address] [place], [place] [address]    Imaging / Studies: Dg Hip Unilat With Pelvis 2-3 Views Left  Result Date: 11/22/2018 CLINICAL DATA:  [place] 2 weeks ago. Persistent left groin and lateral hip pain. EXAM: DG HIP (WITH OR WITHOUT PELVIS) 2-3V LEFT COMPARISON:  06/25/2014 FINDINGS: No evidence of fracture or dislocation. Possible soft tissue swelling in the gluteal  region. Multiple prostate seed implants as seen previously. IMPRESSION: No acute or traumatic bone or joint finding. Possible soft tissue swelling in the left gluteal region. Electronically Signed   By: Nelson Chimes M.D.   On: 11/22/2018 09:16    Medications / Allergies: per chart  Antibiotics: Anti-infectives (From admission, onward)   Start     Dose/Rate Route Frequency Ordered Stop   11/20/18 1800  cefoTEtan (CEFOTAN) 2 g in sodium chloride 0.9 % 100 mL IVPB     2 g 200 mL/hr over 30 Minutes Intravenous Every 12 hours 11/20/18 1231 11/20/18 1823   11/20/18 1400  neomycin (MYCIFRADIN) tablet 1,000 mg  Status:  Discontinued     1,000 mg Oral 3 times per day 11/20/18 0637 11/20/18 0649   11/20/18 1400  metroNIDAZOLE (FLAGYL) tablet 1,000 mg  Status:  Discontinued     1,000 mg Oral 3 times per day 11/20/18 0637 11/20/18 0649   11/20/18 1126  clindamycin (CLEOCIN) 900 mg, gentamicin (GARAMYCIN) 240 mg in sodium chloride 0.9 % 1,000 mL for intraperitoneal lavage  Status:  Discontinued  As needed 11/20/18 1127 11/20/18 1145   11/20/18 0645  cefoTEtan (CEFOTAN) 2 g in sodium chloride 0.9 % 100 mL IVPB     2 g 200 mL/hr over 30 Minutes Intravenous On call to O.R. 11/20/18 [id number] 11/20/18 0851   11/20/18 0600  clindamycin (CLEOCIN) 900 mg, gentamicin (GARAMYCIN) 240 mg in sodium chloride 0.9 % 1,000 mL for intraperitoneal lavage  Status:  Discontinued      Irrigation To Surgery 11/19/18 0749 11/20/18 1231        Note: Portions of this report may have been transcribed using voice recognition software. Every effort was made to ensure accuracy; however, inadvertent computerized transcription errors may be present.   Any transcriptional errors that result from this process are unintentional.     Sharon Mt. Dema Severin, M.D.; [facility]    [address] [place], [place] [address] [phone] Main / Paging [phone] Fax

## 2018-11-23 NOTE — Progress Notes (Signed)
Occupational Therapy Treatment Patient Details Name: Oscar Sparks MRN: [id number] DOB: [birth year] Today's Date: 11/23/2018    History of present illness pt with ascending colon cancer and s/p proximal colectomy. hx of back surgery 2004, prostate cancer, and reports he fell 2 weeks ago on his left hip and it has been sore and painful ever since. He did not get an xray secondary to advice from primary and to not be out and about prior to this surgery.   OT comments  Focused on bed mobility ( incorporated rolling to decrease pain on abdomen)  Follow Up Recommendations  Home health OT;Supervision/Assistance - 24 hour    Equipment Recommendations  3 in 1 bedside commode(to be further assessed; pt has high commode)       Precautions / Restrictions Precautions Precautions: Fall Precaution Comments: recent fall at home within the last 10 days Restrictions Weight Bearing Restrictions: No       Mobility Bed Mobility Overal bed mobility: Needs Assistance Bed Mobility: Rolling;Sidelying to Sit Rolling: Min assist Sidelying to sit: Min assist          Transfers Overall transfer level: Needs assistance Equipment used: Rolling walker (2 wheeled) Transfers: Sit to/from [facility] Sit to Stand: Min assist Stand pivot transfers: Min assist            Balance Overall balance assessment: Needs assistance Sitting-balance support: Bilateral upper extremity supported Sitting balance-Leahy Scale: Fair Sitting balance - Comments: some sway in sitting blance and listed to the L slightly at times when performing other tasks.   Standing balance support: Bilateral upper extremity supported Standing balance-Leahy Scale: Fair Standing balance comment: slight LOB with rising and to trun to initiate stepping                           ADL either performed or assessed with clinical judgement   ADL Overall ADL's : Needs assistance/impaired Eating/Feeding: Set  up;Sitting   Grooming: Set up;Sitting                   Toilet Transfer: Minimal assistance;Ambulation;RW   Toileting- Clothing Manipulation and Hygiene: Minimal assistance;Cueing for safety;Cueing for sequencing;Cueing for compensatory techniques       Functional mobility during ADLs: Minimal assistance;Rolling walker;Cueing for safety       Vision Patient Visual Report: No change from baseline            Cognition Arousal/Alertness: Awake/alert Behavior During Therapy: WFL for tasks assessed/performed Overall Cognitive Status: Within Functional Limits for tasks assessed                                                General Comments      Pertinent Vitals/ Pain       Pain Score: 3  Pain Location: abdomen Pain Descriptors / Indicators: Sore Pain Intervention(s): Repositioned;Limited activity within patient's tolerance         Frequency  Min 2X/week        Progress Toward Goals  OT Goals(current goals can now be found in the care plan section)  Progress towards OT goals: Progressing toward goals  Acute Rehab OT Goals OT Goal Formulation: With patient  Plan Discharge plan remains appropriate       AM-PAC OT "6 Clicks" Daily Activity     Outcome Measure  Help from another person eating meals?: None Help from another person taking care of personal grooming?: A Little Help from another person toileting, which includes using toliet, bedpan, or urinal?: A Little Help from another person bathing (including washing, rinsing, drying)?: A Little Help from another person to put on and taking off regular upper body clothing?: A Little Help from another person to put on and taking off regular lower body clothing?: A Lot 6 Click Score: 18    End of Session Equipment Utilized During Treatment: Rolling walker  OT Visit Diagnosis: Unsteadiness on feet (R26.81);Pain Pain - Right/Left: Left Pain - part of body: Ankle and joints of  foot;Hip   Activity Tolerance Patient tolerated treatment well   Patient Left in chair;with call bell/phone within reach;with chair alarm set   Nurse Communication          Time: [id number] OT Time Calculation (min): 15 min  Charges: OT General Charges $OT Visit: 1 Visit OT Treatments $Self Care/Home Management : 8-22 mins  Kari Baars, [place] [phone] Office- [phone], Edwena Felty D 11/23/2018, 4:57 PM

## 2018-11-24 LAB — CBC WITH DIFFERENTIAL/PLATELET
Abs Immature Granulocytes: 0.05 10*3/uL (ref 0.00–0.07)
Basophils Absolute: 0.1 10*3/uL (ref 0.0–0.1)
Basophils Relative: 1 %
Eosinophils Absolute: 0.2 10*3/uL (ref 0.0–0.5)
Eosinophils Relative: 3 %
HCT: 30.3 % — ABNORMAL LOW (ref 39.0–52.0)
Hemoglobin: 9 g/dL — ABNORMAL LOW (ref 13.0–17.0)
Immature Granulocytes: 1 %
Lymphocytes Relative: 13 %
Lymphs Abs: 1 10*3/uL (ref 0.7–4.0)
MCH: 25.3 pg — ABNORMAL LOW (ref 26.0–34.0)
MCHC: 29.7 g/dL — ABNORMAL LOW (ref 30.0–36.0)
MCV: 85.1 fL (ref 80.0–100.0)
Monocytes Absolute: 0.4 10*3/uL (ref 0.1–1.0)
Monocytes Relative: 6 %
Neutro Abs: 5.8 10*3/uL (ref 1.7–7.7)
Neutrophils Relative %: 76 %
Platelets: 388 10*3/uL (ref 150–400)
RBC: 3.56 MIL/uL — ABNORMAL LOW (ref 4.22–5.81)
RDW: 15.9 % — ABNORMAL HIGH (ref 11.5–15.5)
WBC: 7.6 10*3/uL (ref 4.0–10.5)
nRBC: 0 % (ref 0.0–0.2)

## 2018-11-24 MED ORDER — TRAMADOL HCL 50 MG PO TABS: 50.0000 mg | ORAL_TABLET | Freq: Four times a day (QID) | ORAL | 0 refills | Status: AC | PRN

## 2018-11-24 NOTE — Discharge Summary (Signed)
Patient ID: KELIAN ADRIANCE MRN: [id number] DOB/AGE: 12/21/1940 78 y.o.  Admit date: 11/20/2018 Discharge date: 11/24/2018  Discharge Diagnoses Patient Active Problem List   Diagnosis Date Noted  . 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 Left hip pain 06/25/2014  . 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 Prostate cancer ([place]) 11/15/2009  . 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 CONTACT DERMATITIS 09/04/2007    Consultants Orthopedic surgery, Dr. Ninfa Linden  Procedures OR 11/20/2018 POST-OPERATIVE DIAGNOSIS:  ASCENDING COLON CANCER  PROCEDURE:   XI ROBOT ASSISTED PROXIMAL COLECTOMY OMENTOPEXY TAP BLOCK - BILATERAL   Hospital Course: He was admitted postoperatively where he recovered appropriately. His diet was advanced and he began having bowel function. He had left hip pain x2 weeks, preceding his surgery, and likely related to fall at home. Orthopedic surgery was consulted and evaluated him - cleared him of any acute/bony abnormality and planned outpatient follow-up. Given this fall, he had home health care plans for therapy at home. His daughter is planning to assist. 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 He also had DME and his home has been set up to facilitate safe transition with assistive devices in homes, ramps, etc. He was tolerating a diet, pain well controlled on oral analgesics, mobilizing with therapy well, having bowel function. He was deemed stable for discharge home  NAD, comfortable, sitting up watching TV RRR Abdomen is soft, NT/ND; incisions c/d/i without erythema or drainage   Allergies as of 11/24/2018      Reactions   Poison Ivy Extract Itching, Rash       Medication List    TAKE these medications   FISH OIL PO Take 2,000 mg by mouth daily.   ibuprofen 200 MG tablet Commonly known as: ADVIL Take 400 mg by mouth 3 (three) times daily as needed for moderate pain.   multivitamin with minerals tablet Take 1 tablet by mouth daily.   SUPER B COMPLEX PO Take 1 tablet by mouth daily.   traMADol 50 MG tablet Commonly known as: Ultram Take 1 tablet (50 mg total) by mouth every 6 (six) hours as needed for up to 7 days (postop pain not controlled with tylenol/ibuprofen).   Turmeric Curcumin 500 MG Caps Take 500 mg by mouth daily.   vitamin C 1000 MG tablet Take 1,000 mg by mouth daily.   Vitamin D 50 MCG (2000 UT) Caps Take 2,000 Units by mouth daily.   vitamin E 400 UNIT capsule Take 400 Units by mouth daily.   zinc gluconate 50 MG tablet Take 50 mg by mouth daily.            Durable Medical Equipment  (From admission, onward)         Start     Ordered   11/22/18 0731  For home use only DME Walker rolling  Once    Comments: To help patient transfer and ambulate.  Physical / Occupational Therapy may change type of walker PRN.  Question:  Patient needs a walker to treat with the following condition  Answer:  Balance problem   11/22/18 0730           Discharge Care Instructions  (From admission, onward)  Start     Ordered   11/22/18 0000  Discharge wound care:    Comments: It is good for closed incisions and even open wounds to be washed every day.  Shower every day.  Short baths are fine.  Wash the incisions and wounds clean with soap & water.    You may leave closed incisions open to air if it is dry.   You may cover the incision with clean gauze & replace it after your daily shower for comfort.   11/22/18 1554   11/20/18 0000  Discharge wound care:    Comments: It is good for closed incisions and even open wounds to be washed every day.  Shower every day.  Short baths are fine.  Wash the incisions and  wounds clean with soap & water.    You may leave closed incisions open to air if it is dry.   You may cover the incision with clean gauze & replace it after your daily shower for comfort.   11/20/18 1151           Follow-up Information    Michael Boston, MD. Schedule an appointment as soon as possible for a visit in 3 weeks.   Specialty: General Surgery Why: To follow up after your operation, To follow up after your hospital stay Contact information: [place] [address] [phone]           Tyden Kann M. Dema Severin, M.D. [facility]

## 2018-11-26 ENCOUNTER — Telehealth (INDEPENDENT_AMBULATORY_CARE_PROVIDER_SITE_OTHER): Payer: PRIVATE HEALTH INSURANCE | Admitting: Family Medicine

## 2018-11-26 ENCOUNTER — Encounter: Payer: Self-pay | Admitting: Family Medicine

## 2018-11-26 ENCOUNTER — Other Ambulatory Visit: Payer: Self-pay

## 2018-11-26 ENCOUNTER — Telehealth: Payer: Self-pay

## 2018-11-26 DIAGNOSIS — C182 Malignant neoplasm of ascending colon: Secondary | ICD-10-CM | POA: Diagnosis not present

## 2018-11-26 DIAGNOSIS — M25552 Pain in left hip: Secondary | ICD-10-CM

## 2018-11-26 NOTE — Telephone Encounter (Signed)
Ronalee Belts, with [facility] calling and states patient will be seen on Friday. He states nothing further is needed but he can be contacted with any additional questions or concerns.

## 2018-11-26 NOTE — Telephone Encounter (Signed)
Per Dr. Sarajane Jews, the patient had a telephone visit today for left hip pain due to a fall.  Dr. Sarajane Jews would like for [facility] to assist the patient with PT. I have called [facility] and given all necessary information. They will call back if any further information is needed.

## 2018-11-26 NOTE — Progress Notes (Signed)
Virtual Visit via Telephone Note  I connected with the patient on 11/26/18 at  9:30 AM EDT by telephone and verified that I am speaking with the correct person using two identifiers. We attempted to connect virtually but we had technical difficulties with the audio and video.     I discussed the limitations, risks, security and privacy concerns of performing an evaluation and management service by telephone and the availability of in person appointments. I also discussed with the patient that there may be a patient responsible charge related to this service. The patient expressed understanding and agreed to proceed.  Location patient: home Location provider: work or home office Participants present for the call: patient, provider Patient did not have a visit in the prior 7 days to address this/these issue(s).   History of Present Illness: Here to follow up a hospital stay from 11-20-18 to 11-22-18 for a robot assisted proximal colectomy per Dr. Michael Boston. This was done to remove an adenocarcinoma in the colon. There was no evidence of any metastases. The surgery went well and he is back at home and doing well. He has very little abdominal pain, his bowels are moving normally, appetite is good. He takes nothing for pain now. His main concern is actually left hip pain. He fell at home a month ago, and he has had pain ever since. He was seen in the hospital by Dr. Rush Farmer, and Xrays revealed no fractures. He now has pain when walking and is using a walker.    Observations/Objective: Patient sounds cheerful and well on the phone. I do not appreciate any SOB. Speech and thought processing are grossly intact. Patient reported vitals:  Assessment and Plan: He is recovering from surgery to remove a cancer from the colon, and this is going well. He will follow up with Dr. Johney Maine on 12-16-18. For the hip injury, we will arrange for home health PT to work with him soon.   Follow Up  Instructions:     [id number] [id number] 11-20 9443 21-30 I did not refer this patient for an OV in the next 24 hours for this/these issue(s).  I discussed the assessment and treatment plan with the patient. The patient was provided an opportunity to ask questions and all were answered. The patient agreed with the plan and demonstrated an understanding of the instructions.   The patient was advised to call back or seek an in-person evaluation if the symptoms worsen or if the condition fails to improve as anticipated.  I provided 18 minutes of non-face-to-face time during this encounter.   Alysia Penna, MD

## 2018-11-26 NOTE — Telephone Encounter (Signed)
Noted nothing further needed. 

## 2018-11-29 DIAGNOSIS — Z483 Aftercare following surgery for neoplasm: Secondary | ICD-10-CM | POA: Diagnosis not present

## 2018-11-29 DIAGNOSIS — G8911 Acute pain due to trauma: Secondary | ICD-10-CM | POA: Diagnosis not present

## 2018-11-29 DIAGNOSIS — W19XXXS Unspecified fall, sequela: Secondary | ICD-10-CM | POA: Diagnosis not present

## 2018-11-29 DIAGNOSIS — Z9049 Acquired absence of other specified parts of digestive tract: Secondary | ICD-10-CM | POA: Diagnosis not present

## 2018-11-29 DIAGNOSIS — Z9181 History of falling: Secondary | ICD-10-CM | POA: Diagnosis not present

## 2018-11-29 DIAGNOSIS — G5702 Lesion of sciatic nerve, left lower limb: Secondary | ICD-10-CM | POA: Diagnosis not present

## 2018-11-29 DIAGNOSIS — C189 Malignant neoplasm of colon, unspecified: Secondary | ICD-10-CM | POA: Diagnosis not present

## 2018-11-29 DIAGNOSIS — M40204 Unspecified kyphosis, thoracic region: Secondary | ICD-10-CM | POA: Diagnosis not present

## 2018-12-03 ENCOUNTER — Encounter: Payer: Self-pay | Admitting: Hematology

## 2018-12-03 DIAGNOSIS — M40204 Unspecified kyphosis, thoracic region: Secondary | ICD-10-CM | POA: Diagnosis not present

## 2018-12-03 DIAGNOSIS — W19XXXS Unspecified fall, sequela: Secondary | ICD-10-CM | POA: Diagnosis not present

## 2018-12-03 DIAGNOSIS — Z483 Aftercare following surgery for neoplasm: Secondary | ICD-10-CM | POA: Diagnosis not present

## 2018-12-03 DIAGNOSIS — G5702 Lesion of sciatic nerve, left lower limb: Secondary | ICD-10-CM | POA: Diagnosis not present

## 2018-12-03 DIAGNOSIS — C189 Malignant neoplasm of colon, unspecified: Secondary | ICD-10-CM | POA: Diagnosis not present

## 2018-12-03 DIAGNOSIS — G8911 Acute pain due to trauma: Secondary | ICD-10-CM | POA: Diagnosis not present

## 2018-12-05 ENCOUNTER — Telehealth: Payer: Self-pay | Admitting: Family Medicine

## 2018-12-05 DIAGNOSIS — C189 Malignant neoplasm of colon, unspecified: Secondary | ICD-10-CM | POA: Diagnosis not present

## 2018-12-05 DIAGNOSIS — M40204 Unspecified kyphosis, thoracic region: Secondary | ICD-10-CM | POA: Diagnosis not present

## 2018-12-05 DIAGNOSIS — W19XXXS Unspecified fall, sequela: Secondary | ICD-10-CM | POA: Diagnosis not present

## 2018-12-05 DIAGNOSIS — Z483 Aftercare following surgery for neoplasm: Secondary | ICD-10-CM | POA: Diagnosis not present

## 2018-12-05 DIAGNOSIS — G5702 Lesion of sciatic nerve, left lower limb: Secondary | ICD-10-CM | POA: Diagnosis not present

## 2018-12-05 DIAGNOSIS — G8911 Acute pain due to trauma: Secondary | ICD-10-CM | POA: Diagnosis not present

## 2018-12-05 NOTE — Telephone Encounter (Signed)
Home Health Verbal Orders - Caller/Agency: [facility] Number: [phone] VM can be left  Requesting PT Frequency: 1x a week for 1 week  2x's a week for 4 weeks  1x a week for 1 week  For balancing and strengthening

## 2018-12-06 NOTE — Telephone Encounter (Signed)
Orders have been given.  

## 2018-12-06 NOTE — Telephone Encounter (Signed)
[facility] or orders?

## 2018-12-06 NOTE — Telephone Encounter (Signed)
Please okay these orders  ?

## 2018-12-09 ENCOUNTER — Telehealth: Payer: Self-pay | Admitting: Hematology

## 2018-12-09 DIAGNOSIS — G5702 Lesion of sciatic nerve, left lower limb: Secondary | ICD-10-CM | POA: Diagnosis not present

## 2018-12-09 DIAGNOSIS — W19XXXS Unspecified fall, sequela: Secondary | ICD-10-CM | POA: Diagnosis not present

## 2018-12-09 DIAGNOSIS — Z483 Aftercare following surgery for neoplasm: Secondary | ICD-10-CM | POA: Diagnosis not present

## 2018-12-09 DIAGNOSIS — G8911 Acute pain due to trauma: Secondary | ICD-10-CM | POA: Diagnosis not present

## 2018-12-09 DIAGNOSIS — M40204 Unspecified kyphosis, thoracic region: Secondary | ICD-10-CM | POA: Diagnosis not present

## 2018-12-09 DIAGNOSIS — C189 Malignant neoplasm of colon, unspecified: Secondary | ICD-10-CM | POA: Diagnosis not present

## 2018-12-09 NOTE — Telephone Encounter (Signed)
Mr. Morein cld and confirmed appt w/Dr. Burr Medico on 10/7 at 2pm

## 2018-12-10 ENCOUNTER — Telehealth: Payer: Self-pay | Admitting: Family Medicine

## 2018-12-10 NOTE — Telephone Encounter (Signed)
-----   Message from Marie sent at 12/10/2018  9:22 AM EDT ----- Regarding: FW: virtual visit Please contact pt and schedule an in-office visit for [facility] documentation and hospital follow up.  Thanks- Sheena  ----- Message ----- From: Laurey Morale, MD Sent: 12/09/2018   5:15 PM EDT To: Melburn Hake Cox, CMA Subject: RE: virtual visit                              I don't think he has the ability to do a virtual visit, so make this an in person visit  ----- Message ----- From: Beckie Busing, CMA Sent: 12/09/2018   2:26 PM EDT To: Laurey Morale, MD Subject: virtual visit                                  Per home health guidelines pt needs an in-office or virtual visit for hospital follow up for home health to be accepted. Telephone visits do not count. Could we schedule pt in the next few days for a visit - and do you want in-office or virtual?   Nanine Means has already been to see the pt and need this visit to document medical necessity.  Bethena Roys

## 2018-12-10 NOTE — Telephone Encounter (Signed)
Made appointment with patient.

## 2018-12-12 DIAGNOSIS — Z483 Aftercare following surgery for neoplasm: Secondary | ICD-10-CM | POA: Diagnosis not present

## 2018-12-12 DIAGNOSIS — C189 Malignant neoplasm of colon, unspecified: Secondary | ICD-10-CM | POA: Diagnosis not present

## 2018-12-12 DIAGNOSIS — M40204 Unspecified kyphosis, thoracic region: Secondary | ICD-10-CM | POA: Diagnosis not present

## 2018-12-12 DIAGNOSIS — W19XXXS Unspecified fall, sequela: Secondary | ICD-10-CM | POA: Diagnosis not present

## 2018-12-12 DIAGNOSIS — G8911 Acute pain due to trauma: Secondary | ICD-10-CM | POA: Diagnosis not present

## 2018-12-12 DIAGNOSIS — G5702 Lesion of sciatic nerve, left lower limb: Secondary | ICD-10-CM | POA: Diagnosis not present

## 2018-12-16 DIAGNOSIS — M40204 Unspecified kyphosis, thoracic region: Secondary | ICD-10-CM | POA: Diagnosis not present

## 2018-12-16 DIAGNOSIS — C189 Malignant neoplasm of colon, unspecified: Secondary | ICD-10-CM | POA: Diagnosis not present

## 2018-12-16 DIAGNOSIS — G8911 Acute pain due to trauma: Secondary | ICD-10-CM | POA: Diagnosis not present

## 2018-12-16 DIAGNOSIS — W19XXXS Unspecified fall, sequela: Secondary | ICD-10-CM | POA: Diagnosis not present

## 2018-12-16 DIAGNOSIS — G5702 Lesion of sciatic nerve, left lower limb: Secondary | ICD-10-CM | POA: Diagnosis not present

## 2018-12-16 DIAGNOSIS — Z483 Aftercare following surgery for neoplasm: Secondary | ICD-10-CM | POA: Diagnosis not present

## 2018-12-17 ENCOUNTER — Encounter: Payer: Self-pay | Admitting: Family Medicine

## 2018-12-17 ENCOUNTER — Ambulatory Visit (INDEPENDENT_AMBULATORY_CARE_PROVIDER_SITE_OTHER): Payer: PRIVATE HEALTH INSURANCE | Admitting: Family Medicine

## 2018-12-17 ENCOUNTER — Other Ambulatory Visit: Payer: Self-pay

## 2018-12-17 VITALS — BP 110/70 | Temp 98.1°F | Ht 71.0 in | Wt 138.8 lb

## 2018-12-17 DIAGNOSIS — C182 Malignant neoplasm of ascending colon: Secondary | ICD-10-CM

## 2018-12-17 DIAGNOSIS — M25552 Pain in left hip: Secondary | ICD-10-CM

## 2018-12-17 NOTE — Patient Instructions (Signed)
Health Maintenance Due  Topic Date Due  . INFLUENZA VACCINE  10/26/2018    Depression screen [facility] 2/9 03/12/2017 06/04/2014  Decreased Interest 0 0  Down, Depressed, Hopeless 0 0  PHQ - 2 Score 0 0

## 2018-12-17 NOTE — Progress Notes (Signed)
   Subjective:    Patient ID: Oscar Sparks, male    DOB: 1941/03/24, 78 y.o.   MRN: [id number]  HPI Here to follow up a hospital stay from 11-20-18 to 11-22-18 for colon adenocarcinoma. He had a right colectomy and sampling of 41 regional lymph nodes was totally negative for metastases. Therefore he is not a candidate for chemotherapy. He has done extremely well since then. His appetite is good, he has gained some weight, and his BMs are regular. He has been dealing with some left hip pain and left radicular pain down to the foot. Xrays revealed no fractures. He is going to PT currently and the pain is getting better every day. He walks with a cane whenever he leaves his home.    Review of Systems  Constitutional: Negative.   Respiratory: Negative.   Cardiovascular: Negative.   Gastrointestinal: Negative.   Musculoskeletal: Positive for arthralgias and back pain.       Objective:   Physical Exam Constitutional:      Appearance: Normal appearance.  Cardiovascular:     Rate and Rhythm: Normal rate and regular rhythm.     Pulses: Normal pulses.     Heart sounds: Normal heart sounds.  Pulmonary:     Effort: Pulmonary effort is normal.     Breath sounds: Normal breath sounds.  Abdominal:     General: Abdomen is flat. Bowel sounds are normal. There is no distension.     Palpations: Abdomen is soft. There is no mass.     Tenderness: There is no abdominal tenderness. There is no guarding or rebound.     Hernia: No hernia is present.  Neurological:     Mental Status: He is alert.           Assessment & Plan:  He is recovering well from colon surgery. He is going to PT for sciatica pain. Recheck prn. Alysia Penna, MD

## 2018-12-18 ENCOUNTER — Telehealth: Payer: Self-pay | Admitting: Hematology

## 2018-12-18 NOTE — Telephone Encounter (Signed)
Returned patient's phone call regarding cancelling October appointments, per patient's request appointment has been cancelled per 09/23 schedule message. Left a voicemail for patient.

## 2018-12-19 DIAGNOSIS — Z9181 History of falling: Secondary | ICD-10-CM

## 2018-12-19 DIAGNOSIS — G8911 Acute pain due to trauma: Secondary | ICD-10-CM | POA: Diagnosis not present

## 2018-12-19 DIAGNOSIS — Z483 Aftercare following surgery for neoplasm: Secondary | ICD-10-CM

## 2018-12-19 DIAGNOSIS — W19XXXS Unspecified fall, sequela: Secondary | ICD-10-CM | POA: Diagnosis not present

## 2018-12-19 DIAGNOSIS — C189 Malignant neoplasm of colon, unspecified: Secondary | ICD-10-CM

## 2018-12-19 DIAGNOSIS — M40204 Unspecified kyphosis, thoracic region: Secondary | ICD-10-CM | POA: Diagnosis not present

## 2018-12-19 DIAGNOSIS — Z9049 Acquired absence of other specified parts of digestive tract: Secondary | ICD-10-CM

## 2018-12-19 DIAGNOSIS — G5702 Lesion of sciatic nerve, left lower limb: Secondary | ICD-10-CM | POA: Diagnosis not present

## 2018-12-24 ENCOUNTER — Telehealth: Payer: Self-pay | Admitting: Family Medicine

## 2018-12-24 DIAGNOSIS — C189 Malignant neoplasm of colon, unspecified: Secondary | ICD-10-CM | POA: Diagnosis not present

## 2018-12-24 DIAGNOSIS — Z483 Aftercare following surgery for neoplasm: Secondary | ICD-10-CM | POA: Diagnosis not present

## 2018-12-24 DIAGNOSIS — G8911 Acute pain due to trauma: Secondary | ICD-10-CM | POA: Diagnosis not present

## 2018-12-24 DIAGNOSIS — G5702 Lesion of sciatic nerve, left lower limb: Secondary | ICD-10-CM | POA: Diagnosis not present

## 2018-12-24 DIAGNOSIS — M40204 Unspecified kyphosis, thoracic region: Secondary | ICD-10-CM | POA: Diagnosis not present

## 2018-12-24 DIAGNOSIS — W19XXXS Unspecified fall, sequela: Secondary | ICD-10-CM | POA: Diagnosis not present

## 2018-12-24 NOTE — Telephone Encounter (Signed)
Copied from [place] [id number]. Topic: General - Other >> Dec 20, 2018 11:17 AM Burchel, Abbi R wrote: Pt requesting a callback from [facility].  Please call pt: [phone]

## 2018-12-27 ENCOUNTER — Telehealth: Payer: Self-pay | Admitting: Family Medicine

## 2018-12-27 NOTE — Telephone Encounter (Signed)
Okay for verbal orders? Please advise 

## 2018-12-27 NOTE — Telephone Encounter (Signed)
Please okay these orders  ?

## 2018-12-27 NOTE — Telephone Encounter (Signed)
Home Health Verbal Orders - Caller/Agency: [facility] Number: [phone] Requesting OT/PT/Skilled Nursing/Social Work/Speech Therapy: Discharge moved to Monday, pt woke up with neck pain after sleeping wrong  Frequency:

## 2018-12-29 DIAGNOSIS — Z9181 History of falling: Secondary | ICD-10-CM | POA: Diagnosis not present

## 2018-12-29 DIAGNOSIS — W19XXXS Unspecified fall, sequela: Secondary | ICD-10-CM | POA: Diagnosis not present

## 2018-12-29 DIAGNOSIS — G8911 Acute pain due to trauma: Secondary | ICD-10-CM | POA: Diagnosis not present

## 2018-12-29 DIAGNOSIS — C189 Malignant neoplasm of colon, unspecified: Secondary | ICD-10-CM | POA: Diagnosis not present

## 2018-12-29 DIAGNOSIS — Z483 Aftercare following surgery for neoplasm: Secondary | ICD-10-CM | POA: Diagnosis not present

## 2018-12-29 DIAGNOSIS — M40204 Unspecified kyphosis, thoracic region: Secondary | ICD-10-CM | POA: Diagnosis not present

## 2018-12-29 DIAGNOSIS — Z9049 Acquired absence of other specified parts of digestive tract: Secondary | ICD-10-CM | POA: Diagnosis not present

## 2018-12-29 DIAGNOSIS — G5702 Lesion of sciatic nerve, left lower limb: Secondary | ICD-10-CM | POA: Diagnosis not present

## 2018-12-30 NOTE — Telephone Encounter (Signed)
Message routed to PCP.

## 2018-12-30 NOTE — Telephone Encounter (Signed)
Yes that is okay  

## 2018-12-30 NOTE — Telephone Encounter (Signed)
Verbal orders given to Liji 

## 2018-12-30 NOTE — Telephone Encounter (Signed)
Oscar Sparks also wants to extend orders for 2 weeks.   Okay for orders?

## 2018-12-31 DIAGNOSIS — G8911 Acute pain due to trauma: Secondary | ICD-10-CM | POA: Diagnosis not present

## 2018-12-31 DIAGNOSIS — Z483 Aftercare following surgery for neoplasm: Secondary | ICD-10-CM | POA: Diagnosis not present

## 2018-12-31 DIAGNOSIS — G5702 Lesion of sciatic nerve, left lower limb: Secondary | ICD-10-CM | POA: Diagnosis not present

## 2018-12-31 DIAGNOSIS — C189 Malignant neoplasm of colon, unspecified: Secondary | ICD-10-CM | POA: Diagnosis not present

## 2018-12-31 DIAGNOSIS — W19XXXS Unspecified fall, sequela: Secondary | ICD-10-CM | POA: Diagnosis not present

## 2018-12-31 DIAGNOSIS — M40204 Unspecified kyphosis, thoracic region: Secondary | ICD-10-CM | POA: Diagnosis not present

## 2019-01-01 ENCOUNTER — Ambulatory Visit: Payer: Medicare Other | Admitting: Hematology

## 2019-01-01 DIAGNOSIS — W19XXXS Unspecified fall, sequela: Secondary | ICD-10-CM | POA: Diagnosis not present

## 2019-01-01 DIAGNOSIS — Z483 Aftercare following surgery for neoplasm: Secondary | ICD-10-CM | POA: Diagnosis not present

## 2019-01-01 DIAGNOSIS — G8911 Acute pain due to trauma: Secondary | ICD-10-CM | POA: Diagnosis not present

## 2019-01-01 DIAGNOSIS — M40204 Unspecified kyphosis, thoracic region: Secondary | ICD-10-CM | POA: Diagnosis not present

## 2019-01-01 DIAGNOSIS — G5702 Lesion of sciatic nerve, left lower limb: Secondary | ICD-10-CM | POA: Diagnosis not present

## 2019-01-01 DIAGNOSIS — C189 Malignant neoplasm of colon, unspecified: Secondary | ICD-10-CM | POA: Diagnosis not present

## 2019-01-06 DIAGNOSIS — G5702 Lesion of sciatic nerve, left lower limb: Secondary | ICD-10-CM | POA: Diagnosis not present

## 2019-01-06 DIAGNOSIS — W19XXXS Unspecified fall, sequela: Secondary | ICD-10-CM | POA: Diagnosis not present

## 2019-01-06 DIAGNOSIS — G8911 Acute pain due to trauma: Secondary | ICD-10-CM | POA: Diagnosis not present

## 2019-01-06 DIAGNOSIS — C189 Malignant neoplasm of colon, unspecified: Secondary | ICD-10-CM | POA: Diagnosis not present

## 2019-01-06 DIAGNOSIS — Z483 Aftercare following surgery for neoplasm: Secondary | ICD-10-CM | POA: Diagnosis not present

## 2019-01-06 DIAGNOSIS — M40204 Unspecified kyphosis, thoracic region: Secondary | ICD-10-CM | POA: Diagnosis not present

## 2019-01-09 DIAGNOSIS — G5702 Lesion of sciatic nerve, left lower limb: Secondary | ICD-10-CM | POA: Diagnosis not present

## 2019-01-09 DIAGNOSIS — W19XXXS Unspecified fall, sequela: Secondary | ICD-10-CM | POA: Diagnosis not present

## 2019-01-09 DIAGNOSIS — M40204 Unspecified kyphosis, thoracic region: Secondary | ICD-10-CM | POA: Diagnosis not present

## 2019-01-09 DIAGNOSIS — C189 Malignant neoplasm of colon, unspecified: Secondary | ICD-10-CM | POA: Diagnosis not present

## 2019-01-09 DIAGNOSIS — G8911 Acute pain due to trauma: Secondary | ICD-10-CM | POA: Diagnosis not present

## 2019-01-09 DIAGNOSIS — Z483 Aftercare following surgery for neoplasm: Secondary | ICD-10-CM | POA: Diagnosis not present

## 2019-01-13 DIAGNOSIS — G5702 Lesion of sciatic nerve, left lower limb: Secondary | ICD-10-CM | POA: Diagnosis not present

## 2019-01-13 DIAGNOSIS — C189 Malignant neoplasm of colon, unspecified: Secondary | ICD-10-CM | POA: Diagnosis not present

## 2019-01-13 DIAGNOSIS — W19XXXS Unspecified fall, sequela: Secondary | ICD-10-CM | POA: Diagnosis not present

## 2019-01-13 DIAGNOSIS — G8911 Acute pain due to trauma: Secondary | ICD-10-CM | POA: Diagnosis not present

## 2019-01-13 DIAGNOSIS — Z483 Aftercare following surgery for neoplasm: Secondary | ICD-10-CM | POA: Diagnosis not present

## 2019-01-13 DIAGNOSIS — M40204 Unspecified kyphosis, thoracic region: Secondary | ICD-10-CM | POA: Diagnosis not present

## 2019-01-16 DIAGNOSIS — W19XXXS Unspecified fall, sequela: Secondary | ICD-10-CM | POA: Diagnosis not present

## 2019-01-16 DIAGNOSIS — Z483 Aftercare following surgery for neoplasm: Secondary | ICD-10-CM | POA: Diagnosis not present

## 2019-01-16 DIAGNOSIS — G5702 Lesion of sciatic nerve, left lower limb: Secondary | ICD-10-CM | POA: Diagnosis not present

## 2019-01-16 DIAGNOSIS — G8911 Acute pain due to trauma: Secondary | ICD-10-CM | POA: Diagnosis not present

## 2019-01-16 DIAGNOSIS — M40204 Unspecified kyphosis, thoracic region: Secondary | ICD-10-CM | POA: Diagnosis not present

## 2019-01-16 DIAGNOSIS — C189 Malignant neoplasm of colon, unspecified: Secondary | ICD-10-CM | POA: Diagnosis not present

## 2019-01-21 DIAGNOSIS — W19XXXS Unspecified fall, sequela: Secondary | ICD-10-CM | POA: Diagnosis not present

## 2019-01-21 DIAGNOSIS — G8911 Acute pain due to trauma: Secondary | ICD-10-CM | POA: Diagnosis not present

## 2019-01-21 DIAGNOSIS — Z483 Aftercare following surgery for neoplasm: Secondary | ICD-10-CM | POA: Diagnosis not present

## 2019-01-21 DIAGNOSIS — C189 Malignant neoplasm of colon, unspecified: Secondary | ICD-10-CM | POA: Diagnosis not present

## 2019-01-21 DIAGNOSIS — M40204 Unspecified kyphosis, thoracic region: Secondary | ICD-10-CM | POA: Diagnosis not present

## 2019-01-21 DIAGNOSIS — G5702 Lesion of sciatic nerve, left lower limb: Secondary | ICD-10-CM | POA: Diagnosis not present

## 2019-02-12 ENCOUNTER — Encounter: Payer: Self-pay | Admitting: Family Medicine

## 2019-02-12 ENCOUNTER — Ambulatory Visit (INDEPENDENT_AMBULATORY_CARE_PROVIDER_SITE_OTHER): Payer: PRIVATE HEALTH INSURANCE | Admitting: Family Medicine

## 2019-02-12 ENCOUNTER — Other Ambulatory Visit: Payer: Self-pay

## 2019-02-12 VITALS — BP 120/68 | Temp 97.3°F | Ht 71.0 in | Wt 146.0 lb

## 2019-02-12 DIAGNOSIS — C182 Malignant neoplasm of ascending colon: Secondary | ICD-10-CM | POA: Diagnosis not present

## 2019-02-12 NOTE — Progress Notes (Signed)
   Subjective:    Patient ID: Oscar Sparks, male    DOB: 04/06/1940, 78 y.o.   MRN: [id number]  HPI Here to discuss returning to work. 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 He has done well since then with PT, and he continues to do home exercises daily. His appetite is back to normal and he is regaining strength. He has been cleared by his surgeon, Dr. Johney Maine, and he will not see him until next year. The patient is ready to return to work and says he is bored by sitting around his house. 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    Review of Systems  Respiratory: Negative.   Cardiovascular: Negative.   Gastrointestinal: Negative.        Objective:   Physical Exam Constitutional:      Appearance: Normal appearance.  Cardiovascular:     Rate and Rhythm: Normal rate and regular rhythm.     Pulses: Normal pulses.     Heart sounds: Normal heart sounds.  Pulmonary:     Effort: Pulmonary effort is normal.     Breath sounds: Normal breath sounds.  Neurological:     General: No focal deficit present.     Mental Status: He is alert and oriented to person, place, and time.           Assessment & Plan:  He is doing well I have cleared him to return to work on 02-24-19 on a full schedule. But we will limit his lifting and carrying to no more than 10 lbs. Recheck as needed.  Alysia Penna, MD

## 2019-04-16 ENCOUNTER — Telehealth: Payer: Self-pay | Admitting: Family Medicine

## 2019-04-16 DIAGNOSIS — M25552 Pain in left hip: Secondary | ICD-10-CM

## 2019-04-16 NOTE — Telephone Encounter (Signed)
The referral was done  

## 2019-04-16 NOTE — Telephone Encounter (Signed)
Ok for referral?

## 2019-04-16 NOTE — Telephone Encounter (Signed)
Pt came into the office asking for a referral to an Orthopedics office regarding his left hip. Pt would like to be contacted regarding the referral at [phone]

## 2019-04-17 NOTE — Telephone Encounter (Signed)
Patient is aware 

## 2019-04-24 ENCOUNTER — Ambulatory Visit (INDEPENDENT_AMBULATORY_CARE_PROVIDER_SITE_OTHER): Payer: PRIVATE HEALTH INSURANCE | Admitting: Orthopaedic Surgery

## 2019-04-24 ENCOUNTER — Other Ambulatory Visit: Payer: Self-pay

## 2019-04-24 ENCOUNTER — Encounter: Payer: Self-pay | Admitting: Orthopaedic Surgery

## 2019-04-24 DIAGNOSIS — M25552 Pain in left hip: Secondary | ICD-10-CM

## 2019-04-24 MED ORDER — BUPIVACAINE HCL 0.5 % IJ SOLN
3.0000 mL | INTRAMUSCULAR | Status: AC | PRN
  Administered 2019-04-24: 10:00:00 3 mL via INTRA_ARTICULAR

## 2019-04-24 MED ORDER — LIDOCAINE HCL 1 % IJ SOLN
3.0000 mL | INTRAMUSCULAR | Status: AC | PRN
  Administered 2019-04-24: 10:00:00 3 mL

## 2019-04-24 MED ORDER — METHYLPREDNISOLONE ACETATE 40 MG/ML IJ SUSP
40.0000 mg | INTRAMUSCULAR | Status: AC | PRN
  Administered 2019-04-24: 10:00:00 40 mg via INTRA_ARTICULAR

## 2019-04-24 NOTE — Progress Notes (Signed)
Office Visit Note   Patient: Oscar Sparks           Date of Birth: [birth year]           MRN: [id number] Visit Date: 04/24/2019              Requested by: Oscar Morale, MD [place],  [place] [address] PCP: Oscar Morale, MD   Assessment & Plan: Visit Diagnoses:  1. Pain in left hip     Plan: Impression is chronic left hip pain.  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  He has done 12 weeks of physical therapy with slight improvement.  Based on our discussion patient has elected to try cortisone injection today.  Patient instructed to follow-up if he does not receive any relief from this injection.  Likely next step would be to obtain MRI.  Follow-Up Instructions: Return if symptoms worsen or fail to improve.   Orders:  No orders of the defined types were placed in this encounter.  No orders of the defined types were placed in this encounter.     Procedures: Large Joint Inj: L greater trochanter on 04/24/2019 9:30 AM Indications: pain Details: 22 G needle Medications: 3 mL lidocaine 1 %; 3 mL bupivacaine 0.5 %; 40 mg methylPREDNISolone acetate 40 MG/ML Outcome: tolerated well, no immediate complications Patient was prepped and draped in the usual sterile fashion.       Clinical Data: No additional findings.   Subjective: Chief Complaint  Patient presents with  . Left Hip - Pain    Oscar Sparks is a very pleasant and active 79 year old gentleman who comes in for chronic left hip pain mainly to the lateral side.  He had a fall in August onto his left hip which may have aggravated things.  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  He logs approximately 13,000 steps a day.  He has noticed that since he has gone up to working 5 days a week his pain has been worse.  He denies any back pain or groin pain to a significant degree.  Denies any numbness and  tingling.  Occasionally the pain does radiate down the lateral side of his thigh.   Review of Systems  Constitutional: Negative.   All other systems reviewed and are negative.    Objective: Vital Signs: There were no vitals taken for this visit.  Physical Exam Vitals and nursing note reviewed.  Constitutional:      Appearance: He is well-developed.  HENT:     Head: Normocephalic and atraumatic.  Eyes:     Pupils: Pupils are equal, round, and reactive to light.  Pulmonary:     Effort: Pulmonary effort is normal.  Abdominal:     Palpations: Abdomen is soft.  Musculoskeletal:        General: Normal range of motion.     Cervical back: Neck supple.  Skin:    General: Skin is warm.  Neurological:     Mental Status: He is alert and oriented to person, place, and time.  Psychiatric:        Behavior: Behavior normal.        Thought Content: Thought content normal.        Judgment: Judgment normal.     Ortho Exam Left hip exam shows full range of motion without any significant pain.  He has slight tenderness  to palpation over the lateral aspect of the greater trochanter.  He has pain with resisted hip abduction.  Negative Stinchfield sign.  Negative logroll. Specialty Comments:  No specialty comments available.  Imaging: No results found.   PMFS History: Patient Active Problem List   Diagnosis Date Noted  . 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 Pain in left hip 06/25/2014  . 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 Prostate cancer ([place]) 11/15/2009  . 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 CONTACT DERMATITIS 09/04/2007   Past Medical History:  Diagnosis Date  . Arthritis    knees  . Colon cancer ([place]) 09/24/2018  . ED (erectile dysfunction)   . Neuromuscular disorder (HCC)     neuropathy in feet  . Peripheral vascular disease ([place])    poor curculation in hands and feet hard to monitor with pulse oximetry  . Prostate cancer [facility])    sees Dr. Gaynelle Sparks, received cesium seeds     Family History  Problem Relation Age of Onset  . Breast cancer Other     Past Surgical History:  Procedure Laterality Date  . BACK SURGERY    . SPINE SURGERY  2004   Dr. Sherwood Gambler  . TONSILLECTOMY     Social History   Occupational History  . Not on file  Tobacco Use  . Smoking status: Never Smoker  . Smokeless tobacco: Never Used  Substance and Sexual Activity  . Alcohol use: Yes    Alcohol/week: 1.0 standard drinks    Types: 1 Glasses of wine per week  . Drug use: No  . Sexual activity: Not on file

## 2019-05-14 DIAGNOSIS — X32XXXD Exposure to sunlight, subsequent encounter: Secondary | ICD-10-CM | POA: Diagnosis not present

## 2019-05-14 DIAGNOSIS — L570 Actinic keratosis: Secondary | ICD-10-CM | POA: Diagnosis not present

## 2019-05-14 DIAGNOSIS — L821 Other seborrheic keratosis: Secondary | ICD-10-CM | POA: Diagnosis not present

## 2019-05-14 DIAGNOSIS — D225 Melanocytic nevi of trunk: Secondary | ICD-10-CM | POA: Diagnosis not present

## 2019-05-26 ENCOUNTER — Encounter: Payer: Self-pay | Admitting: Orthopaedic Surgery

## 2019-05-26 ENCOUNTER — Other Ambulatory Visit: Payer: Self-pay

## 2019-05-26 ENCOUNTER — Ambulatory Visit (INDEPENDENT_AMBULATORY_CARE_PROVIDER_SITE_OTHER): Payer: PRIVATE HEALTH INSURANCE | Admitting: Orthopaedic Surgery

## 2019-05-26 DIAGNOSIS — M25552 Pain in left hip: Secondary | ICD-10-CM

## 2019-05-26 NOTE — Progress Notes (Signed)
Office Visit Note   Patient: Oscar Sparks           Date of Birth: [birth year]           MRN: [id number] Visit Date: 05/26/2019              Requested by: Laurey Morale, MD [place],  [place] [address] PCP: Laurey Morale, MD   Assessment & Plan: Visit Diagnoses:  1. Pain in left hip     Plan: Impression is chronic left lateral hip pain.  Based on our discussion I think the best course of action is to try outpatient physical therapy which he is in agreement with.  He will give [place] a call if he does not notice any improvement.  Follow-Up Instructions: Return if symptoms worsen or fail to improve.   Orders:  Orders Placed This Encounter  Procedures  . Ambulatory referral to Physical Therapy   No orders of the defined types were placed in this encounter.     Procedures: No procedures performed   Clinical Data: No additional findings.   Subjective: Chief Complaint  Patient presents with  . Left Hip - Pain    Oscar Sparks returns today for continued left hip pain.  Previous cortisone injection gave him 1 week really good relief from which he could walk and exercise without pain.  Overall he is not in a lot of pain anyways.  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  He occasionally walks with a cane.   Review of Systems  Constitutional: Negative.   All other systems reviewed and are negative.    Objective: Vital Signs: There were no vitals taken for this visit.  Physical Exam Vitals and nursing note reviewed.  Constitutional:      Appearance: He is well-developed.  Pulmonary:     Effort: Pulmonary effort is normal.  Abdominal:     Palpations: Abdomen is soft.  Skin:    General: Skin is warm.  Neurological:     Mental Status: He is alert and oriented to person, place, and time.  Psychiatric:        Behavior: Behavior normal.        Thought Content: Thought content normal.        Judgment: Judgment  normal.     Ortho Exam Left hip exam is unchanged. Specialty Comments:  No specialty comments available.  Imaging: No results found.   PMFS History: Patient Active Problem List   Diagnosis Date Noted  . 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 Pain in left hip 06/25/2014  . 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 Prostate cancer ([place]) 11/15/2009  . 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 CONTACT DERMATITIS 09/04/2007   Past Medical History:  Diagnosis Date  . Arthritis    knees  . Colon cancer ([place]) 09/24/2018  . ED (erectile dysfunction)   . 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 Peripheral vascular disease ([place])    poor curculation in hands and feet hard to monitor with pulse oximetry  . Prostate cancer [facility])    sees Dr. Gaynelle Arabian, received cesium seeds     Family History  Problem Relation Age of Onset  . Breast cancer Other     Past  Surgical History:  Procedure Laterality Date  . BACK SURGERY    . SPINE SURGERY  2004   Dr. Sherwood Gambler  . TONSILLECTOMY     Social History   Occupational History  . Not on file  Tobacco Use  . Smoking status: Never Smoker  . Smokeless tobacco: Never Used  Substance and Sexual Activity  . Alcohol use: Yes    Alcohol/week: 1.0 standard drinks    Types: 1 Glasses of wine per week  . Drug use: No  . Sexual activity: Not on file

## 2019-06-06 ENCOUNTER — Ambulatory Visit (INDEPENDENT_AMBULATORY_CARE_PROVIDER_SITE_OTHER): Payer: PRIVATE HEALTH INSURANCE | Admitting: Rehabilitative and Restorative Service Providers"

## 2019-06-06 ENCOUNTER — Other Ambulatory Visit: Payer: Self-pay

## 2019-06-06 ENCOUNTER — Encounter: Payer: Self-pay | Admitting: Rehabilitative and Restorative Service Providers"

## 2019-06-06 DIAGNOSIS — M6281 Muscle weakness (generalized): Secondary | ICD-10-CM | POA: Diagnosis not present

## 2019-06-06 DIAGNOSIS — R262 Difficulty in walking, not elsewhere classified: Secondary | ICD-10-CM | POA: Diagnosis not present

## 2019-06-06 DIAGNOSIS — M25552 Pain in left hip: Secondary | ICD-10-CM | POA: Diagnosis not present

## 2019-06-06 NOTE — Patient Instructions (Signed)
Access Code: [email or web address] URL: [email or web address] Date: 06/06/2019 Prepared by: Scot Jun  Exercises Supine Piriformis Stretch - 5 reps - 1 sets - 30 hold - 2x daily - 7x weekly Clamshell - 10 reps - 3 sets - 2x daily - 7x weekly Supine Bridge - 10 reps - 3 sets - 2 hold - 2x daily - 7x weekly

## 2019-06-06 NOTE — Therapy (Signed)
[place], [place], [address] Phone: [phone]   Fax:  [phone]  Physical Therapy Evaluation  Patient Details  Name: Oscar Sparks MRN: [id number] Date of Birth: [birth year] Referring Provider (PT): Dr. Erlinda Hong   Encounter Date: 06/06/2019  PT End of Session - 06/06/19 0842    Visit Number  1    Number of Visits  12    Date for PT Re-Evaluation  07/18/19    PT Start Time  0845    PT Stop Time  0925    PT Time Calculation (min)  40 min    Activity Tolerance  Patient tolerated treatment well    Behavior During Therapy  [facility] for tasks assessed/performed       Past Medical History:  Diagnosis Date  . Arthritis    knees  . Colon cancer ([place]) 09/24/2018  . ED (erectile dysfunction)   . 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 Peripheral vascular disease ([place])    poor curculation in hands and feet hard to monitor with pulse oximetry  . Prostate cancer [facility])    sees Dr. Gaynelle Arabian, received cesium seeds     Past Surgical History:  Procedure Laterality Date  . BACK SURGERY    . SPINE SURGERY  2004   Dr. Sherwood Gambler  . TONSILLECTOMY      There were no vitals filed for this visit.   Subjective Assessment - 06/06/19 0851    Subjective  Pt. comes to clinic c complaints of Lt lateral hip pain.  Pt. stated dx of colon cancer in July and was planning on removal but had to wait for 6 weeks due to pandemic.  Pt. stated 2 weeks prior to scheduled surgery, he fell on L hip and had pain.  Pt. stated cancer surgery was performed and successful.  Pt. stated he communicated the Lt hip pain from the fall to the MD for cancer surgery.  Pt. indicated xray was performed and negative at that time.  Pt. stated pain c walking is still persistent and has to use cane at times in Rt UE.  Pt. stated retiring from work today.  Pt. stated having home health after surgery for 7 visits x 2 rounds with some improvement overall but  symptoms still noted.  Pt. stated seeing ortho MD at [facility] and wanting to get more improvement and has referral for PT.    Limitations  Standing;Walking    Diagnostic tests  Xray in past.    Patient Stated Goals  Improve pain, walk, maybe continue to work 2-3 days in various options.    Currently in Pain?  Yes    Pain Score  1    pain at worst 4/10.   Pain Location  Leg    Pain Orientation  Left    Pain Descriptors / Indicators  Aching    Pain Type  Chronic pain    Pain Onset  More than a month ago    Pain Frequency  Intermittent    Aggravating Factors   Patient specific functional scale in ().  walking (eval: 5/10), stairs (eval  5/10), transfers ( eval 5/10)         [facility] PT Assessment - 06/06/19 0001      Assessment   Medical Diagnosis  Lt lateral hip pain, bursititis, abductor tendinosis    Referring Provider (PT)  Dr. Erlinda Hong    Onset Date/Surgical Date  11/12/19    Prior Therapy  Home  health      Precautions   Precautions  None      Restrictions   Weight Bearing Restrictions  No      Balance Screen   Has the patient fallen in the past 6 months  Yes    How many times?  1    Has the patient had a decrease in activity level because of a fear of falling?   Yes    Is the patient reluctant to leave their home because of a fear of falling?   No      Home Film/video editor residence    Living Arrangements  Spouse/significant other    Home Access  Stairs to enter    Entrance Stairs-Number of Steps  2    [place]  One level      Prior Function   Level of Independence  Independent      Cognition   Overall Cognitive Status  Within Functional Limits for tasks assessed      Sensation   Light Touch  Appears Intact      Functional Tests   Functional tests  Single leg stance;Sit to Stand      Single Leg Stance   Comments  Rt: 7 seconds, Lt: 4 seconds c pain      Sit to Stand   Comments  5 x sit to stand: 11.46 seconds      Posture/Postural  Control   Posture Comments  Increased thoracic kyphosis      ROM / Strength   AROM / PROM / Strength  AROM;PROM;Strength      AROM   Overall AROM Comments  Firm end feel and pain noted in Lt hip ER limitation    AROM Assessment Site  Lumbar;Hip    Right/Left Hip  Left;Right    Right Hip External Rotation   50    Right Hip Internal Rotation   25    Left Hip External Rotation   30   pain at end range lateral Lt hip   Left Hip Internal Rotation   20    Lumbar Flexion  Pulling in Lt posterior/lateral hip    Lumbar Extension  50% WFL, no change on LE symptoms      PROM   PROM Assessment Site  Hip    Right/Left Hip  Left;Right    Left Hip External Rotation   35   overpressure pain noted     Strength   Overall Strength Comments  Mild lateral Lt hip complaints c Lt hip abd MMT, Lt hip flexion MMT.     Strength Assessment Site  Hip;Knee    Right/Left Hip  Left;Right    Right Hip Flexion  5/5    Right Hip ABduction  4/5    Left Hip Flexion  4/5    Left Hip Extension  3+/5    Left Hip ABduction  3+/5    Right/Left Knee  Left;Right    Right Knee Flexion  5/5    Right Knee Extension  5/5    Left Knee Flexion  5/5    Left Knee Extension  5/5      Palpation   Palpation comment  Mild tenderness surrounding Lt greater trochanter, trP noted in glute med, max      Special Tests    Special Tests  Hip Special Tests    Hip Special Tests   Trendelenberg Test;Hip Scouring;Piriformis Test      Trendelenburg Test  Findings  Positive    Side  Left      Hip Scouring   Findings  Negative    Side  Left      Piriformis Test   Findings  Positive    Side   Left      Transfers   Five time sit to stand comments   11.46 seconds      Ambulation/Gait   Gait Pattern  Step-through pattern;Trendelenburg;Antalgic;Lateral trunk lean to left                Objective measurements completed on examination: See above findings.      [facility] Adult PT Treatment/Exercise - 06/06/19 0001       Exercises   Exercises  Knee/Hip      Knee/Hip Exercises: Stretches   Piriformis Stretch  Both;5 reps;30 seconds      Knee/Hip Exercises: Supine   Bridges  AROM;Both;20 reps      Knee/Hip Exercises: Sidelying   Clams  3 x 10 bilaterally      Manual Therapy   Manual therapy comments  Passive Lt hamstring stretch, palpation,TrP compression Lt glute med             PT Education - 06/06/19 0831    Education Details  HEP, POC    Person(s) Educated  Patient    Methods  Explanation;Demonstration;Handout;Verbal cues    Comprehension  Verbalized understanding;Returned demonstration       PT Short Term Goals - 06/06/19 0931      PT SHORT TERM GOAL #1   Title  Patient will demonstrate independent use of home exercise program to maintain progress from in clinic treatments.    Time  2    Period  Weeks    Status  New    Target Date  06/20/19        PT Long Term Goals - 06/06/19 0832      PT LONG TERM GOAL #1   Title  Patient will demonstrate/report pain at worst less than or equal to 2/10 to facilitate minimal limitation in daily activity secondary to pain symptoms.    Time  6    Period  Weeks    Status  New    Target Date  07/18/19      PT LONG TERM GOAL #2   Title  Patient will demonstrate independent use of home exercise program to facilitate ability to maintain/progress functional gains from skilled physical therapy services.    Time  6    Period  Weeks    Status  New    Target Date  07/18/19      PT LONG TERM GOAL #3   Title  Pt. will demonstrate Lt hip AROM equal to Rt s symptoms to facilitate usual mobility for daily transfers, functional activity at PLOF.    Time  6    Period  Weeks    Status  New    Target Date  07/18/19      PT LONG TERM GOAL #4   Title  Pt. will demonstrate B SLS > 15 seconds to facilitate improved stability in independent ambulation to reduce fall risk.    Time  6    Period  Weeks    Status  New    Target Date  07/18/19       PT LONG TERM GOAL #5   Title  Pt. will demonstrate Lt hip MMT equal to Rt to facilitate usual walking, standing, stairs at PLOF s limitation.  Time  6    Period  Weeks    Status  New    Target Date  07/18/19      Additional Long Term Goals   Additional Long Term Goals  Yes      PT LONG TERM GOAL #6   Title  Pt. will report patient specific functional scale average > 8/10 to indicate minimal disability 2/2 condiiton.    Time  6    Period  Weeks    Status  New    Target Date  07/18/19             Plan - 06/06/19 0835    Clinical Impression Statement  Patient is a 79 y.o. male who comes to clinic with complaints of Lt hip pain with mobility, strength and movement coordination deficits that impair his ability to perform usual daily and recreational functional activities without increase difficulty/symptoms at this time.  Patient to benefit from skilled PT services to address impairments and limitations to improve to previous level of function without restriction secondary to condition.    Personal Factors and Comorbidities  Comorbidity 3+    Comorbidities  PVD, neuropathy bilaterally, history of colon and prostate cancer    Rehab Potential  Good    PT Frequency  2x / week    PT Duration  6 weeks    PT Treatment/Interventions  ADLs/Self Care Home Management;Electrical Stimulation;Ultrasound;Traction;Moist Heat;Iontophoresis 4mg /ml Dexamethasone;Gait training;Stair training;Functional mobility training;Therapeutic activities;Therapeutic exercise;Balance training;Neuromuscular re-education;Manual techniques;Patient/family education;Passive range of motion;Dry needling;Joint Manipulations;Spinal Manipulations;Vasopneumatic Device;Taping    PT Next Visit Plan  Review HEP    PT Home Exercise Plan  [email or web address]    Consulted and Agree with Plan of Care  Patient       Patient will benefit from skilled therapeutic intervention in order to improve the following deficits and impairments:   Abnormal gait, Decreased coordination, Decreased range of motion, Difficulty walking, Increased fascial restricitons, Decreased endurance, Pain, Decreased balance, Decreased activity tolerance, Hypomobility, Decreased strength, Decreased mobility  Visit Diagnosis: Pain in left hip - Plan: PT plan of care cert/re-cert  Muscle weakness (generalized) - Plan: PT plan of care cert/re-cert  Difficulty in walking, not elsewhere classified - Plan: PT plan of care cert/re-cert     Problem List Patient Active Problem List   Diagnosis Date Noted  . 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 Pain in left hip 06/25/2014  . 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 Prostate cancer ([place]) 11/15/2009  . 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 CONTACT DERMATITIS 09/04/2007   Scot Jun, PT, DPT, OCS, ATC 06/06/19  9:40 AM    [facility] Physical Therapy [address] [place], [place], [address] Phone: [phone]   Fax:  [phone]  Name: Oscar Sparks MRN: [id number] Date of Birth: 19-Jul-1940

## 2019-06-12 ENCOUNTER — Ambulatory Visit (INDEPENDENT_AMBULATORY_CARE_PROVIDER_SITE_OTHER): Payer: Medicare Other | Admitting: Physical Therapy

## 2019-06-12 ENCOUNTER — Encounter: Payer: Self-pay | Admitting: Physical Therapy

## 2019-06-12 ENCOUNTER — Other Ambulatory Visit: Payer: Self-pay

## 2019-06-12 DIAGNOSIS — R262 Difficulty in walking, not elsewhere classified: Secondary | ICD-10-CM | POA: Diagnosis not present

## 2019-06-12 DIAGNOSIS — M25552 Pain in left hip: Secondary | ICD-10-CM

## 2019-06-12 DIAGNOSIS — M6281 Muscle weakness (generalized): Secondary | ICD-10-CM

## 2019-06-12 NOTE — Patient Instructions (Signed)
Access Code: [email or web address] URL: [email or web address] Date: 06/12/2019 Prepared by: Faustino Congress  Exercises Supine Piriformis Stretch - 2 x daily - 7 x weekly - 5 reps - 1 sets - 30 hold Clamshell - 2 x daily - 7 x weekly - 10 reps - 3 sets Supine Bridge - 2 x daily - 7 x weekly - 10 reps - 3 sets - 2 hold Seated Hamstring Stretch - 2 x daily - 7 x weekly - 1 sets - 5 reps - 30 hold Hooklying Single Leg Bent Knee Fallouts with Resistance - 2 x daily - 7 x weekly - 3 sets - 10 reps

## 2019-06-12 NOTE — Therapy (Signed)
[place], [place], [address] Phone: [phone]   Fax:  [phone]  Physical Therapy Treatment  Patient Details  Name: Oscar Sparks MRN: [id number] Date of Birth: Aug 18, 1940 Referring Provider (PT): Dr. Erlinda Hong   Encounter Date: 06/12/2019  PT End of Session - 06/12/19 1027    Visit Number  2    Number of Visits  12    Date for PT Re-Evaluation  07/18/19    PT Start Time  0932    PT Stop Time  1012    PT Time Calculation (min)  40 min    Activity Tolerance  Patient tolerated treatment well    Behavior During Therapy  [facility] for tasks assessed/performed       Past Medical History:  Diagnosis Date  . Arthritis    knees  . Colon cancer ([place]) 09/24/2018  . ED (erectile dysfunction)   . 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 Peripheral vascular disease ([place])    poor curculation in hands and feet hard to monitor with pulse oximetry  . Prostate cancer [facility])    sees Dr. Gaynelle Arabian, received cesium seeds     Past Surgical History:  Procedure Laterality Date  . BACK SURGERY    . SPINE SURGERY  2004   Dr. Sherwood Gambler  . TONSILLECTOMY      There were no vitals filed for this visit.  Subjective Assessment - 06/12/19 0935    Subjective  Lt hip pain is still present, but feels like the exercises have been helping.  exercises are going well.    Limitations  Standing;Walking    Diagnostic tests  Xray in past.    Patient Stated Goals  Improve pain, walk, maybe continue to work 2-3 days in various options.    Currently in Pain?  No/denies    Pain Onset  More than a month ago                       [facility] Adult PT Treatment/Exercise - 06/12/19 0936      Knee/Hip Exercises: Stretches   Passive Hamstring Stretch  Right;Left;3 reps;30 seconds    Passive Hamstring Stretch Limitations  seated    Piriformis Stretch  Right;Left;3 reps;30 seconds      Knee/Hip Exercises: Aerobic   Nustep  L4 x 6  min      Knee/Hip Exercises: Supine   Bridges  AROM;Both;10 reps    Bridges Limitations  for HEP review    Other Supine Knee/Hip Exercises  single limb clamshell x15 bil; red theraband      Knee/Hip Exercises: Sidelying   Clams  x15 bil for HEP review             PT Education - 06/12/19 1026    Education Details  added to HEP    Person(s) Educated  Patient    Methods  Explanation;Demonstration;Handout    Comprehension  Verbalized understanding;Returned demonstration;Need further instruction       PT Short Term Goals - 06/06/19 0931      PT SHORT TERM GOAL #1   Title  Patient will demonstrate independent use of home exercise program to maintain progress from in clinic treatments.    Time  2    Period  Weeks    Status  New    Target Date  06/20/19        PT Long Term Goals - 06/06/19 [id number]  PT LONG TERM GOAL #1   Title  Patient will demonstrate/report pain at worst less than or equal to 2/10 to facilitate minimal limitation in daily activity secondary to pain symptoms.    Time  6    Period  Weeks    Status  New    Target Date  07/18/19      PT LONG TERM GOAL #2   Title  Patient will demonstrate independent use of home exercise program to facilitate ability to maintain/progress functional gains from skilled physical therapy services.    Time  6    Period  Weeks    Status  New    Target Date  07/18/19      PT LONG TERM GOAL #3   Title  Pt. will demonstrate Lt hip AROM equal to Rt s symptoms to facilitate usual mobility for daily transfers, functional activity at PLOF.    Time  6    Period  Weeks    Status  New    Target Date  07/18/19      PT LONG TERM GOAL #4   Title  Pt. will demonstrate B SLS > 15 seconds to facilitate improved stability in independent ambulation to reduce fall risk.    Time  6    Period  Weeks    Status  New    Target Date  07/18/19      PT LONG TERM GOAL #5   Title  Pt. will demonstrate Lt hip MMT equal to Rt to facilitate  usual walking, standing, stairs at PLOF s limitation.    Time  6    Period  Weeks    Status  New    Target Date  07/18/19      Additional Long Term Goals   Additional Long Term Goals  Yes      PT LONG TERM GOAL #6   Title  Pt. will report patient specific functional scale average > 8/10 to indicate minimal disability 2/2 condiiton.    Time  6    Period  Weeks    Status  New    Target Date  07/18/19            Plan - 06/12/19 1027    Clinical Impression Statement  Pt tolerated session well with main focus on review of HEP needing min cues for technique at this time.  Added 2 additional exercises today to HEP.  He reports decreasing pain and compliance with HEP.    Personal Factors and Comorbidities  Comorbidity 3+    Comorbidities  PVD, neuropathy bilaterally, history of colon and prostate cancer    Rehab Potential  Good    PT Frequency  2x / week    PT Duration  6 weeks    PT Treatment/Interventions  ADLs/Self Care Home Management;Electrical Stimulation;Ultrasound;Traction;Moist Heat;Iontophoresis 4mg /ml Dexamethasone;Gait training;Stair training;Functional mobility training;Therapeutic activities;Therapeutic exercise;Balance training;Neuromuscular re-education;Manual techniques;Patient/family education;Passive range of motion;Dry needling;Joint Manipulations;Spinal Manipulations;Vasopneumatic Device;Taping    PT Next Visit Plan  continue with hip strength/stretches, manual PRN    PT Home Exercise Plan  [id number]    Consulted and Agree with Plan of Care  Patient       Patient will benefit from skilled therapeutic intervention in order to improve the following deficits and impairments:  Abnormal gait, Decreased coordination, Decreased range of motion, Difficulty walking, Increased fascial restricitons, Decreased endurance, Pain, Decreased balance, Decreased activity tolerance, Hypomobility, Decreased strength, Decreased mobility  Visit Diagnosis: Pain in left hip  Muscle  weakness (generalized)  Difficulty in walking, not elsewhere classified     Problem List Patient Active Problem List   Diagnosis Date Noted  . 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 Pain in left hip 06/25/2014  . 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 Prostate cancer ([place]) 11/15/2009  . 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 CONTACT DERMATITIS 09/04/2007      Laureen Abrahams, PT, DPT 06/12/19 10:31 AM    [facility] Physical Therapy [address] [place], [place], [address] Phone: [phone]   Fax:  [phone]  Name: CHARVEZ PRIVOTT MRN: [id number] Date of Birth: [birth year]

## 2019-06-16 ENCOUNTER — Ambulatory Visit (INDEPENDENT_AMBULATORY_CARE_PROVIDER_SITE_OTHER): Payer: Medicare Other | Admitting: Rehabilitative and Restorative Service Providers"

## 2019-06-16 ENCOUNTER — Other Ambulatory Visit: Payer: Self-pay

## 2019-06-16 DIAGNOSIS — R262 Difficulty in walking, not elsewhere classified: Secondary | ICD-10-CM | POA: Diagnosis not present

## 2019-06-16 DIAGNOSIS — M6281 Muscle weakness (generalized): Secondary | ICD-10-CM

## 2019-06-16 DIAGNOSIS — M25552 Pain in left hip: Secondary | ICD-10-CM

## 2019-06-16 NOTE — Therapy (Addendum)
[place], [place], [address] Phone: [phone]   Fax:  [phone]  Physical Therapy Treatment  Patient Details  Name: Oscar Sparks MRN: [id number] Date of Birth: 1940/04/21 Referring Provider (PT): Dr. Erlinda Hong   Encounter Date: 06/16/2019  PT End of Session - 06/16/19 0809    Visit Number  3    Number of Visits  12    Date for PT Re-Evaluation  07/18/19    PT Start Time  0800    PT Stop Time  0839    PT Time Calculation (min)  39 min    Activity Tolerance  Patient tolerated treatment well    Behavior During Therapy  [facility] for tasks assessed/performed       Past Medical History:  Diagnosis Date  . Arthritis    knees  . Colon cancer ([place]) 09/24/2018  . ED (erectile dysfunction)   . 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 Peripheral vascular disease ([place])    poor curculation in hands and feet hard to monitor with pulse oximetry  . Prostate cancer [facility])    sees Dr. Gaynelle Arabian, received cesium seeds     Past Surgical History:  Procedure Laterality Date  . BACK SURGERY    . SPINE SURGERY  2004   Dr. Sherwood Gambler  . TONSILLECTOMY      There were no vitals filed for this visit.  Subjective Assessment - 06/16/19 0803    Subjective  Pt. inidcated no pain complaints upon arrival today.  Making progress.  Some balance complaints at times.    Currently in Pain?  No/denies    Pain Score  0-No pain    Pain Location  Leg                       [facility] Adult PT Treatment/Exercise - 06/16/19 0001      Neuro Re-ed    Neuro Re-ed Details   tandem stance 15 seconds x 5 bilateral, lateral stepping 3 cones x 6 bilateral, anterior/reverse stepping x 10 each LE 1st      Knee/Hip Exercises: Stretches   Gastroc Stretch  Both;3 reps;30 seconds      Knee/Hip Exercises: Aerobic   Recumbent Bike  L1 10 mins       Knee/Hip Exercises: Standing   Other Standing Knee Exercises  alternating lateral step  up/over 4 inch step x 10 each LE      Knee/Hip Exercises: Supine   Bridges  Both;Strengthening   3 x 10     Knee/Hip Exercises: Sidelying   Clams  3 x 10 bilateral             PT Education - 06/16/19 0808    Education Details  Balance exercise intervention cues and explanation    Person(s) Educated  Patient    Methods  Explanation;Demonstration;Verbal cues    Comprehension  Returned demonstration;Verbalized understanding       PT Short Term Goals - 06/06/19 0931      PT SHORT TERM GOAL #1   Title  Patient will demonstrate independent use of home exercise program to maintain progress from in clinic treatments.    Time  2    Period  Weeks    Status  New    Target Date  06/20/19        PT Long Term Goals - 06/06/19 0832      PT LONG TERM GOAL #1   Title  Patient will demonstrate/report pain at worst less than or equal to 2/10 to facilitate minimal limitation in daily activity secondary to pain symptoms.    Time  6    Period  Weeks    Status  New    Target Date  07/18/19      PT LONG TERM GOAL #2   Title  Patient will demonstrate independent use of home exercise program to facilitate ability to maintain/progress functional gains from skilled physical therapy services.    Time  6    Period  Weeks    Status  New    Target Date  07/18/19      PT LONG TERM GOAL #3   Title  Pt. will demonstrate Lt hip AROM equal to Rt s symptoms to facilitate usual mobility for daily transfers, functional activity at PLOF.    Time  6    Period  Weeks    Status  New    Target Date  07/18/19      PT LONG TERM GOAL #4   Title  Pt. will demonstrate B SLS > 15 seconds to facilitate improved stability in independent ambulation to reduce fall risk.    Time  6    Period  Weeks    Status  New    Target Date  07/18/19      PT LONG TERM GOAL #5   Title  Pt. will demonstrate Lt hip MMT equal to Rt to facilitate usual walking, standing, stairs at PLOF s limitation.    Time  6    Period   Weeks    Status  New    Target Date  07/18/19      Additional Long Term Goals   Additional Long Term Goals  Yes      PT LONG TERM GOAL #6   Title  Pt. will report patient specific functional scale average > 8/10 to indicate minimal disability 2/2 condiiton.    Time  6    Period  Weeks    Status  New    Target Date  07/18/19            Plan - 06/16/19 0808    Clinical Impression Statement  Progress in pain complaints noted.  Pt. does present c lateral hip weakness and balance control deficits at this time that warrant continued skilled PT services at this time.    Personal Factors and Comorbidities  Comorbidity 3+    Comorbidities  PVD, neuropathy bilaterally, history of colon and prostate cancer    Rehab Potential  Good    PT Frequency  2x / week    PT Duration  6 weeks    PT Treatment/Interventions  ADLs/Self Care Home Management;Electrical Stimulation;Ultrasound;Traction;Moist Heat;Iontophoresis 4mg /ml Dexamethasone;Gait training;Stair training;Functional mobility training;Therapeutic activities;Therapeutic exercise;Balance training;Neuromuscular re-education;Manual techniques;Patient/family education;Passive range of motion;Dry needling;Joint Manipulations;Spinal Manipulations;Vasopneumatic Device;Taping    PT Next Visit Plan  Manual for pain complaints prn.  Balance and strength progression.    PT Home Exercise Plan  [id number]    Consulted and Agree with Plan of Care  Patient       Patient will benefit from skilled therapeutic intervention in order to improve the following deficits and impairments:  Abnormal gait, Decreased coordination, Decreased range of motion, Difficulty walking, Increased fascial restricitons, Decreased endurance, Pain, Decreased balance, Decreased activity tolerance, Hypomobility, Decreased strength, Decreased mobility  Visit Diagnosis: Pain in left hip  Muscle weakness (generalized)  Difficulty in walking, not elsewhere classified     Problem  List Patient Active Problem List   Diagnosis Date Noted  . 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 Pain in left hip 06/25/2014  . 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 Prostate cancer ([place]) 11/15/2009  . 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 CONTACT DERMATITIS 09/04/2007    Scot Jun, PT, DPT, OCS, ATC 06/16/19  8:57 AM    [facility] Physical Therapy [address] [place], [place], [address] Phone: [phone]   Fax:  [phone]  Name: HEZAKIAH PALADINO MRN: [id number] Date of Birth: [birth year]

## 2019-06-18 ENCOUNTER — Ambulatory Visit (INDEPENDENT_AMBULATORY_CARE_PROVIDER_SITE_OTHER): Payer: Medicare Other | Admitting: Physical Therapy

## 2019-06-18 ENCOUNTER — Other Ambulatory Visit: Payer: Self-pay

## 2019-06-18 DIAGNOSIS — M25552 Pain in left hip: Secondary | ICD-10-CM | POA: Diagnosis not present

## 2019-06-18 DIAGNOSIS — M6281 Muscle weakness (generalized): Secondary | ICD-10-CM

## 2019-06-18 DIAGNOSIS — R262 Difficulty in walking, not elsewhere classified: Secondary | ICD-10-CM

## 2019-06-18 NOTE — Therapy (Signed)
[place], [place], [address] Phone: [phone]   Fax:  [phone]  Physical Therapy Treatment  Patient Details  Name: Oscar Sparks MRN: [id number] Date of Birth: [birth year] Referring Provider (PT): Dr. Erlinda Hong   Encounter Date: 06/18/2019  PT End of Session - 06/18/19 0910    Visit Number  4    Number of Visits  12    Date for PT Re-Evaluation  07/18/19    PT Start Time  0805    PT Stop Time  0850    PT Time Calculation (min)  45 min    Activity Tolerance  Patient tolerated treatment well    Behavior During Therapy  [facility] for tasks assessed/performed       Past Medical History:  Diagnosis Date  . Arthritis    knees  . Colon cancer ([place]) 09/24/2018  . ED (erectile dysfunction)   . 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 Peripheral vascular disease ([place])    poor curculation in hands and feet hard to monitor with pulse oximetry  . Prostate cancer [facility])    sees Dr. Gaynelle Arabian, received cesium seeds     Past Surgical History:  Procedure Laterality Date  . BACK SURGERY    . SPINE SURGERY  2004   Dr. Sherwood Gambler  . TONSILLECTOMY      There were no vitals filed for this visit.  Subjective Assessment - 06/18/19 0822    Subjective  2/10 pain in his Lt hip upon arrival, he feels he is improving    Limitations  Standing;Walking    Diagnostic tests  Xray in past.    Patient Stated Goals  Improve pain, walk, maybe continue to work 2-3 days in various options.    Pain Onset  More than a month ago           [facility] Adult PT Treatment/Exercise - 06/18/19 0001      Neuro Re-ed    Neuro Re-ed Details   walking on foam balance beam fwd and lateral X 5 ea, standing marches on airex pad X 10 bilat      Knee/Hip Exercises: Stretches   ITB Stretch  Left;2 reps;30 seconds    Piriformis Stretch  Left;2 reps;30 seconds      Knee/Hip Exercises: Aerobic   Recumbent Bike  L2 with 10 min      Knee/Hip  Exercises: Standing   Other Standing Knee Exercises  alternating lateral step up/over 6 inch step x 10 each LE      Knee/Hip Exercises: Supine   Bridges  3 sets;10 reps    Other Supine Knee/Hip Exercises  single limb clamshell x15 bil; green theraband      Knee/Hip Exercises: Sidelying   Other Sidelying Knee/Hip Exercises  reverse clams 2X10 on Lt               PT Short Term Goals - 06/06/19 0931      PT SHORT TERM GOAL #1   Title  Patient will demonstrate independent use of home exercise program to maintain progress from in clinic treatments.    Time  2    Period  Weeks    Status  New    Target Date  06/20/19        PT Long Term Goals - 06/06/19 0832      PT LONG TERM GOAL #1   Title  Patient will demonstrate/report pain at worst less than or equal to 2/10  to facilitate minimal limitation in daily activity secondary to pain symptoms.    Time  6    Period  Weeks    Status  New    Target Date  07/18/19      PT LONG TERM GOAL #2   Title  Patient will demonstrate independent use of home exercise program to facilitate ability to maintain/progress functional gains from skilled physical therapy services.    Time  6    Period  Weeks    Status  New    Target Date  07/18/19      PT LONG TERM GOAL #3   Title  Pt. will demonstrate Lt hip AROM equal to Rt s symptoms to facilitate usual mobility for daily transfers, functional activity at PLOF.    Time  6    Period  Weeks    Status  New    Target Date  07/18/19      PT LONG TERM GOAL #4   Title  Pt. will demonstrate B SLS > 15 seconds to facilitate improved stability in independent ambulation to reduce fall risk.    Time  6    Period  Weeks    Status  New    Target Date  07/18/19      PT LONG TERM GOAL #5   Title  Pt. will demonstrate Lt hip MMT equal to Rt to facilitate usual walking, standing, stairs at PLOF s limitation.    Time  6    Period  Weeks    Status  New    Target Date  07/18/19      Additional Long  Term Goals   Additional Long Term Goals  Yes      PT LONG TERM GOAL #6   Title  Pt. will report patient specific functional scale average > 8/10 to indicate minimal disability 2/2 condiiton.    Time  6    Period  Weeks    Status  New    Target Date  07/18/19            Plan - 06/18/19 0910    Clinical Impression Statement  Progressed his overall hip strength and balance today with good tolerance and without complaints. He does still have significant tightness/weakness and decreased balance for his Left hip and will conitnue to benefit from PT.    Personal Factors and Comorbidities  Comorbidity 3+    Comorbidities  PVD, neuropathy bilaterally, history of colon and prostate cancer    Rehab Potential  Good    PT Frequency  2x / week    PT Duration  6 weeks    PT Treatment/Interventions  ADLs/Self Care Home Management;Electrical Stimulation;Ultrasound;Traction;Moist Heat;Iontophoresis 4mg /ml Dexamethasone;Gait training;Stair training;Functional mobility training;Therapeutic activities;Therapeutic exercise;Balance training;Neuromuscular re-education;Manual techniques;Patient/family education;Passive range of motion;Dry needling;Joint Manipulations;Spinal Manipulations;Vasopneumatic Device;Taping    PT Next Visit Plan  Manual for pain complaints prn.  Balance and strength progression.    PT Home Exercise Plan  [id number]    Consulted and Agree with Plan of Care  Patient       Patient will benefit from skilled therapeutic intervention in order to improve the following deficits and impairments:  Abnormal gait, Decreased coordination, Decreased range of motion, Difficulty walking, Increased fascial restricitons, Decreased endurance, Pain, Decreased balance, Decreased activity tolerance, Hypomobility, Decreased strength, Decreased mobility  Visit Diagnosis: Pain in left hip  Muscle weakness (generalized)  Difficulty in walking, not elsewhere classified     Problem List Patient Active  Problem List  Diagnosis Date Noted  . 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 Pain in left hip 06/25/2014  . 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 Prostate cancer ([place]) 11/15/2009  . 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 CONTACT DERMATITIS 09/04/2007    Debbe Odea, PT,DPT 06/18/2019, 9:11 AM  [facility] Physical Therapy [address] [place], [place], [address] Phone: [phone]   Fax:  [phone]  Name: Oscar Sparks MRN: [id number] Date of Birth: 09/28/1940

## 2019-06-23 ENCOUNTER — Other Ambulatory Visit: Payer: Self-pay

## 2019-06-23 ENCOUNTER — Ambulatory Visit (INDEPENDENT_AMBULATORY_CARE_PROVIDER_SITE_OTHER): Payer: Medicare Other | Admitting: Rehabilitative and Restorative Service Providers"

## 2019-06-23 ENCOUNTER — Encounter: Payer: Self-pay | Admitting: Rehabilitative and Restorative Service Providers"

## 2019-06-23 DIAGNOSIS — M6281 Muscle weakness (generalized): Secondary | ICD-10-CM | POA: Diagnosis not present

## 2019-06-23 DIAGNOSIS — M25552 Pain in left hip: Secondary | ICD-10-CM | POA: Diagnosis not present

## 2019-06-23 DIAGNOSIS — R262 Difficulty in walking, not elsewhere classified: Secondary | ICD-10-CM | POA: Diagnosis not present

## 2019-06-23 NOTE — Therapy (Signed)
[facility] Physical Therapy [place], [place], [address] Phone: [phone]   Fax:  [phone]  Physical Therapy Treatment  Patient Details  Name: Oscar Sparks MRN: [id number] Date of Birth: 01/04/1941 Referring Provider (PT): Dr. Erlinda Hong   Encounter Date: 06/23/2019  PT End of Session - 06/23/19 0912    Visit Number  5    Number of Visits  12    Date for PT Re-Evaluation  07/18/19    PT Start Time  0843    PT Stop Time  0925    PT Time Calculation (min)  42 min    Activity Tolerance  Patient tolerated treatment well    Behavior During Therapy  [facility] for tasks assessed/performed       Past Medical History:  Diagnosis Date  . Arthritis    knees  . Colon cancer ([place]) 09/24/2018  . ED (erectile dysfunction)   . 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 Peripheral vascular disease ([place])    poor curculation in hands and feet hard to monitor with pulse oximetry  . Prostate cancer [facility])    sees Dr. Gaynelle Arabian, received cesium seeds     Past Surgical History:  Procedure Laterality Date  . BACK SURGERY    . SPINE SURGERY  2004   Dr. Sherwood Gambler  . TONSILLECTOMY      There were no vitals filed for this visit.  Subjective Assessment - 06/23/19 0846    Subjective  No pain indicated upon arrival today.  Pt. stated feeling like he has improved overall c balance but still needs work.    Limitations  Standing;Walking    Diagnostic tests  Xray in past.    Patient Stated Goals  Improve pain, walk, maybe continue to work 2-3 days in various options.    Pain Score  0-No pain    Pain Location  Leg    Pain Orientation  Left    Pain Onset  More than a month ago                       [facility] Adult PT Treatment/Exercise - 06/23/19 0001      Neuro Re-ed    Neuro Re-ed Details   3 cone lateral stepping x 10 bilateral, step over touch 6 inch hurdle 20 x bilateral      Knee/Hip Exercises: Stretches   ITB Stretch  Left;3 reps;30  seconds    Piriformis Stretch  Left;3 reps;30 seconds      Knee/Hip Exercises: Aerobic   Nustep  Lvl 6 10 mins      Knee/Hip Exercises: Standing   Other Standing Knee Exercises  alternating lateral step up/over 6 inch step x 10 each LE      Knee/Hip Exercises: Supine   Bridges  Strengthening;Both;20 reps    Other Supine Knee/Hip Exercises  single limb clamshell 3x10 bil; blue theraband             PT Education - 06/23/19 0847    Education Details  Intervention cues at times.    Person(s) Educated  Patient    Methods  Explanation;Demonstration;Verbal cues       PT Short Term Goals - 06/23/19 0847      PT SHORT TERM GOAL #1   Title  Patient will demonstrate independent use of home exercise program to maintain progress from in clinic treatments.    Time  2    Period  Weeks  Status  Achieved    Target Date  06/20/19        PT Long Term Goals - 06/06/19 0832      PT LONG TERM GOAL #1   Title  Patient will demonstrate/report pain at worst less than or equal to 2/10 to facilitate minimal limitation in daily activity secondary to pain symptoms.    Time  6    Period  Weeks    Status  New    Target Date  07/18/19      PT LONG TERM GOAL #2   Title  Patient will demonstrate independent use of home exercise program to facilitate ability to maintain/progress functional gains from skilled physical therapy services.    Time  6    Period  Weeks    Status  New    Target Date  07/18/19      PT LONG TERM GOAL #3   Title  Pt. will demonstrate Lt hip AROM equal to Rt s symptoms to facilitate usual mobility for daily transfers, functional activity at PLOF.    Time  6    Period  Weeks    Status  New    Target Date  07/18/19      PT LONG TERM GOAL #4   Title  Pt. will demonstrate B SLS > 15 seconds to facilitate improved stability in independent ambulation to reduce fall risk.    Time  6    Period  Weeks    Status  New    Target Date  07/18/19      PT LONG TERM GOAL #5    Title  Pt. will demonstrate Lt hip MMT equal to Rt to facilitate usual walking, standing, stairs at PLOF s limitation.    Time  6    Period  Weeks    Status  New    Target Date  07/18/19      Additional Long Term Goals   Additional Long Term Goals  Yes      PT LONG TERM GOAL #6   Title  Pt. will report patient specific functional scale average > 8/10 to indicate minimal disability 2/2 condiiton.    Time  6    Period  Weeks    Status  New    Target Date  07/18/19            Plan - 06/23/19 0911    Clinical Impression Statement  Pt. continues to report reduced pain complaints overall. Static balance control improving, dynamic balance control fair at best c occasions of loss of balance requiring clinician assistance.  Continued focus on balance control indicated to reduce fall risk.    Personal Factors and Comorbidities  Comorbidity 3+    Comorbidities  PVD, neuropathy bilaterally, history of colon and prostate cancer    Rehab Potential  Good    PT Frequency  2x / week    PT Duration  6 weeks    PT Treatment/Interventions  ADLs/Self Care Home Management;Electrical Stimulation;Ultrasound;Traction;Moist Heat;Iontophoresis 4mg /ml Dexamethasone;Gait training;Stair training;Functional mobility training;Therapeutic activities;Therapeutic exercise;Balance training;Neuromuscular re-education;Manual techniques;Patient/family education;Passive range of motion;Dry needling;Joint Manipulations;Spinal Manipulations;Vasopneumatic Device;Taping    PT Next Visit Plan  Movement coordination, static complaint surface and dynamic stability.    PT Home Exercise Plan  [id number]    Consulted and Agree with Plan of Care  Patient       Patient will benefit from skilled therapeutic intervention in order to improve the following deficits and impairments:  Abnormal gait, Decreased coordination, Decreased  range of motion, Difficulty walking, Increased fascial restricitons, Decreased endurance, Pain,  Decreased balance, Decreased activity tolerance, Hypomobility, Decreased strength, Decreased mobility  Visit Diagnosis: Pain in left hip  Muscle weakness (generalized)  Difficulty in walking, not elsewhere classified     Problem List Patient Active Problem List   Diagnosis Date Noted  . 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 Pain in left hip 06/25/2014  . 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 Prostate cancer ([place]) 11/15/2009  . 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 CONTACT DERMATITIS 09/04/2007    Scot Jun, PT, DPT, OCS, ATC 06/23/19  9:27 AM    [facility] Physical Therapy [address] [place], [place], [address] Phone: [phone]   Fax:  [phone]  Name: Oscar Sparks MRN: [id number] Date of Birth: 13-Jun-1940

## 2019-06-25 ENCOUNTER — Other Ambulatory Visit: Payer: Self-pay

## 2019-06-25 ENCOUNTER — Ambulatory Visit (INDEPENDENT_AMBULATORY_CARE_PROVIDER_SITE_OTHER): Payer: Medicare Other | Admitting: Physical Therapy

## 2019-06-25 DIAGNOSIS — R262 Difficulty in walking, not elsewhere classified: Secondary | ICD-10-CM | POA: Diagnosis not present

## 2019-06-25 DIAGNOSIS — M25552 Pain in left hip: Secondary | ICD-10-CM | POA: Diagnosis not present

## 2019-06-25 DIAGNOSIS — M6281 Muscle weakness (generalized): Secondary | ICD-10-CM | POA: Diagnosis not present

## 2019-06-25 NOTE — Therapy (Signed)
[place], [place], [address] Phone: [phone]   Fax:  [phone]  Physical Therapy Treatment  Patient Details  Name: Oscar Sparks MRN: [id number] Date of Birth: 31-Jan-1941 Referring Provider (PT): Dr. Erlinda Hong   Encounter Date: 06/25/2019  PT End of Session - 06/25/19 0847    Visit Number  6    Number of Visits  12    Date for PT Re-Evaluation  07/18/19    PT Start Time  0800    PT Stop Time  0845    PT Time Calculation (min)  45 min    Activity Tolerance  Patient tolerated treatment well    Behavior During Therapy  [facility] for tasks assessed/performed       Past Medical History:  Diagnosis Date  . Arthritis    knees  . Colon cancer ([place]) 09/24/2018  . ED (erectile dysfunction)   . 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 Peripheral vascular disease ([place])    poor curculation in hands and feet hard to monitor with pulse oximetry  . Prostate cancer [facility])    sees Dr. Gaynelle Arabian, received cesium seeds     Past Surgical History:  Procedure Laterality Date  . BACK SURGERY    . SPINE SURGERY  2004   Dr. Sherwood Gambler  . TONSILLECTOMY      There were no vitals filed for this visit.  Subjective Assessment - 06/25/19 0826    Subjective  No pain indicated upon arrival today.  Pt. stated feeling like he has improved overall but balance but still needs work.    Limitations  Standing;Walking    Diagnostic tests  Xray in past.    Patient Stated Goals  Improve pain, walk, maybe continue to work 2-3 days in various options.    Currently in Pain?  No/denies    Pain Onset  More than a month ago                       [facility] Adult PT Treatment/Exercise - 06/25/19 0001      Neuro Re-ed    Neuro Re-ed Details   balance on beam for walking fwd and lateral up/down X5 reps ea.  marches on airex pad X 15 reps bilat no UE support, wobble board for A-P and lateral X 10 reps ea      Knee/Hip Exercises:  Stretches   ITB Stretch  Left;3 reps;30 seconds    Piriformis Stretch  Left;3 reps;30 seconds      Knee/Hip Exercises: Aerobic   Nustep  Lvl 6 10 mins      Knee/Hip Exercises: Standing   Other Standing Knee Exercises  alternating lateral step up/over 6 inch step x 15 each LE    Other Standing Knee Exercises  lateral walking with L2 band around feet in bars up/down X 3      Knee/Hip Exercises: Seated   Sit to Sand  10 reps;without UE support   L2 band around knees     Knee/Hip Exercises: Supine   Bridges Limitations  25 reps    Other Supine Knee/Hip Exercises  single limb clamshell 20 reps then bilat 20 reps; blue theraband               PT Short Term Goals - 06/23/19 0847      PT SHORT TERM GOAL #1   Title  Patient will demonstrate independent use of home exercise program to maintain progress  from in clinic treatments.    Time  2    Period  Weeks    Status  Achieved    Target Date  06/20/19        PT Long Term Goals - 06/06/19 0832      PT LONG TERM GOAL #1   Title  Patient will demonstrate/report pain at worst less than or equal to 2/10 to facilitate minimal limitation in daily activity secondary to pain symptoms.    Time  6    Period  Weeks    Status  New    Target Date  07/18/19      PT LONG TERM GOAL #2   Title  Patient will demonstrate independent use of home exercise program to facilitate ability to maintain/progress functional gains from skilled physical therapy services.    Time  6    Period  Weeks    Status  New    Target Date  07/18/19      PT LONG TERM GOAL #3   Title  Pt. will demonstrate Lt hip AROM equal to Rt s symptoms to facilitate usual mobility for daily transfers, functional activity at PLOF.    Time  6    Period  Weeks    Status  New    Target Date  07/18/19      PT LONG TERM GOAL #4   Title  Pt. will demonstrate B SLS > 15 seconds to facilitate improved stability in independent ambulation to reduce fall risk.    Time  6     Period  Weeks    Status  New    Target Date  07/18/19      PT LONG TERM GOAL #5   Title  Pt. will demonstrate Lt hip MMT equal to Rt to facilitate usual walking, standing, stairs at PLOF s limitation.    Time  6    Period  Weeks    Status  New    Target Date  07/18/19      Additional Long Term Goals   Additional Long Term Goals  Yes      PT LONG TERM GOAL #6   Title  Pt. will report patient specific functional scale average > 8/10 to indicate minimal disability 2/2 condiiton.    Time  6    Period  Weeks    Status  New    Target Date  07/18/19            Plan - 06/25/19 0848    Clinical Impression Statement  His pain levels are well managed. He is making steady progress with LE strength and balance but still has deficits in these areas and will continue to benefit from PT. Added more uneven surfaces today to further challenge balance.    Personal Factors and Comorbidities  Comorbidity 3+    Comorbidities  PVD, neuropathy bilaterally, history of colon and prostate cancer    Rehab Potential  Good    PT Frequency  2x / week    PT Duration  6 weeks    PT Treatment/Interventions  ADLs/Self Care Home Management;Electrical Stimulation;Ultrasound;Traction;Moist Heat;Iontophoresis 4mg /ml Dexamethasone;Gait training;Stair training;Functional mobility training;Therapeutic activities;Therapeutic exercise;Balance training;Neuromuscular re-education;Manual techniques;Patient/family education;Passive range of motion;Dry needling;Joint Manipulations;Spinal Manipulations;Vasopneumatic Device;Taping    PT Next Visit Plan  Movement coordination, static complaint surface and dynamic stability.    PT Home Exercise Plan  [id number]    Consulted and Agree with Plan of Care  Patient       Patient will  benefit from skilled therapeutic intervention in order to improve the following deficits and impairments:  Abnormal gait, Decreased coordination, Decreased range of motion, Difficulty walking, Increased  fascial restricitons, Decreased endurance, Pain, Decreased balance, Decreased activity tolerance, Hypomobility, Decreased strength, Decreased mobility  Visit Diagnosis: Pain in left hip  Muscle weakness (generalized)  Difficulty in walking, not elsewhere classified     Problem List Patient Active Problem List   Diagnosis Date Noted  . 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 Pain in left hip 06/25/2014  . 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 Prostate cancer ([place]) 11/15/2009  . 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 CONTACT DERMATITIS 09/04/2007    Debbe Odea, PT,DPT 06/25/2019, 8:50 AM  [facility] Physical Therapy [address] [place], [place], [address] Phone: [phone]   Fax:  [phone]  Name: Oscar Sparks MRN: [id number] Date of Birth: 1940/07/07

## 2019-06-30 ENCOUNTER — Other Ambulatory Visit: Payer: Self-pay

## 2019-06-30 ENCOUNTER — Ambulatory Visit (INDEPENDENT_AMBULATORY_CARE_PROVIDER_SITE_OTHER): Payer: Medicare Other | Admitting: Physical Therapy

## 2019-06-30 DIAGNOSIS — M25552 Pain in left hip: Secondary | ICD-10-CM | POA: Diagnosis not present

## 2019-06-30 DIAGNOSIS — R262 Difficulty in walking, not elsewhere classified: Secondary | ICD-10-CM

## 2019-06-30 DIAGNOSIS — M6281 Muscle weakness (generalized): Secondary | ICD-10-CM | POA: Diagnosis not present

## 2019-06-30 NOTE — Therapy (Signed)
[place], [place], [address] Phone: [phone]   Fax:  [phone]  Physical Therapy Treatment  Patient Details  Name: Oscar Sparks MRN: [id number] Date of Birth: Jul 08, 1940 Referring Provider (PT): Dr. Erlinda Hong   Encounter Date: 06/30/2019  PT End of Session - 06/30/19 [id number]    Visit Number  7    Number of Visits  12    Date for PT Re-Evaluation  07/18/19    PT Start Time  0804    PT Stop Time  0845    PT Time Calculation (min)  41 min    Activity Tolerance  Patient tolerated treatment well    Behavior During Therapy  [facility] for tasks assessed/performed       Past Medical History:  Diagnosis Date  . Arthritis    knees  . Colon cancer ([place]) 09/24/2018  . ED (erectile dysfunction)   . 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 Peripheral vascular disease ([place])    poor curculation in hands and feet hard to monitor with pulse oximetry  . Prostate cancer [facility])    sees Dr. Gaynelle Arabian, received cesium seeds     Past Surgical History:  Procedure Laterality Date  . BACK SURGERY    . SPINE SURGERY  2004   Dr. Sherwood Gambler  . TONSILLECTOMY      There were no vitals filed for this visit.  Subjective Assessment - 06/30/19 0812    Subjective  relays no pain, says he thinks his balance is slowly getting better    Limitations  Standing;Walking    Diagnostic tests  Xray in past.    Patient Stated Goals  Improve pain, walk, maybe continue to work 2-3 days in various options.    Currently in Pain?  No/denies    Pain Onset  More than a month ago         [facility] PT Assessment - 06/30/19 0001      Assessment   Medical Diagnosis  Lt lateral hip pain, bursititis, abductor tendinosis    Referring Provider (PT)  Dr. Erlinda Hong    Onset Date/Surgical Date  11/12/19      Single Leg Stance   Comments  Rt 9 sec, Lt 5 sec, no pain      AROM   Left Hip External Rotation   35    Left Hip Internal Rotation   30                    [facility] Adult PT Treatment/Exercise - 06/30/19 0001      Neuro Re-ed    Neuro Re-ed Details   SLS X 3 reps each side, balance on beam for walking fwd and lateral up/down X3 reps ea.  marches on airex pad X 15 reps bilat no UE support, tandem balance on airex 30 sec X 2, stepping over cones fwd and lateral X 5, SLS cone taps X 5 bilat      Knee/Hip Exercises: Stretches   ITB Stretch  Left;3 reps;30 seconds    Piriformis Stretch  Left;3 reps;30 seconds      Knee/Hip Exercises: Aerobic   Nustep  Lvl 7 10 mins      Knee/Hip Exercises: Supine   Bridges Limitations  25 reps    Other Supine Knee/Hip Exercises   bilat 2X15 reps; blue theraband               PT Short Term Goals - 06/23/19 [id number]  PT SHORT TERM GOAL #1   Title  Patient will demonstrate independent use of home exercise program to maintain progress from in clinic treatments.    Time  2    Period  Weeks    Status  Achieved    Target Date  06/20/19        PT Long Term Goals - 06/06/19 0832      PT LONG TERM GOAL #1   Title  Patient will demonstrate/report pain at worst less than or equal to 2/10 to facilitate minimal limitation in daily activity secondary to pain symptoms.    Time  6    Period  Weeks    Status  New    Target Date  07/18/19      PT LONG TERM GOAL #2   Title  Patient will demonstrate independent use of home exercise program to facilitate ability to maintain/progress functional gains from skilled physical therapy services.    Time  6    Period  Weeks    Status  New    Target Date  07/18/19      PT LONG TERM GOAL #3   Title  Pt. will demonstrate Lt hip AROM equal to Rt s symptoms to facilitate usual mobility for daily transfers, functional activity at PLOF.    Time  6    Period  Weeks    Status  New    Target Date  07/18/19      PT LONG TERM GOAL #4   Title  Pt. will demonstrate B SLS > 15 seconds to facilitate improved stability in independent ambulation to  reduce fall risk.    Time  6    Period  Weeks    Status  New    Target Date  07/18/19      PT LONG TERM GOAL #5   Title  Pt. will demonstrate Lt hip MMT equal to Rt to facilitate usual walking, standing, stairs at PLOF s limitation.    Time  6    Period  Weeks    Status  New    Target Date  07/18/19      Additional Long Term Goals   Additional Long Term Goals  Yes      PT LONG TERM GOAL #6   Title  Pt. will report patient specific functional scale average > 8/10 to indicate minimal disability 2/2 condiiton.    Time  6    Period  Weeks    Status  New    Target Date  07/18/19            Plan - 06/30/19 [id number]    Clinical Impression Statement  Has not been having pain but will continue to benefit from further Leg strength and balance. He continues to progress toward his goals. He has improved his Lt hip AROM and SLS time but still with deficits in these.    Personal Factors and Comorbidities  Comorbidity 3+    Comorbidities  PVD, neuropathy bilaterally, history of colon and prostate cancer    Rehab Potential  Good    PT Frequency  2x / week    PT Duration  6 weeks    PT Treatment/Interventions  ADLs/Self Care Home Management;Electrical Stimulation;Ultrasound;Traction;Moist Heat;Iontophoresis 4mg /ml Dexamethasone;Gait training;Stair training;Functional mobility training;Therapeutic activities;Therapeutic exercise;Balance training;Neuromuscular re-education;Manual techniques;Patient/family education;Passive range of motion;Dry needling;Joint Manipulations;Spinal Manipulations;Vasopneumatic Device;Taping    PT Next Visit Plan  Movement coordination, static complaint surface and dynamic stability.    PT Home Exercise Plan  [id number]    Consulted and Agree with Plan of Care  Patient       Patient will benefit from skilled therapeutic intervention in order to improve the following deficits and impairments:  Abnormal gait, Decreased coordination, Decreased range of motion, Difficulty  walking, Increased fascial restricitons, Decreased endurance, Pain, Decreased balance, Decreased activity tolerance, Hypomobility, Decreased strength, Decreased mobility  Visit Diagnosis: Pain in left hip  Muscle weakness (generalized)  Difficulty in walking, not elsewhere classified     Problem List Patient Active Problem List   Diagnosis Date Noted  . 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 Pain in left hip 06/25/2014  . 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 Prostate cancer ([place]) 11/15/2009  . 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 CONTACT DERMATITIS 09/04/2007    Debbe Odea, PT,DPT 06/30/2019, 8:49 AM  [facility] Physical Therapy [address] [place], [place], [address] Phone: [phone]   Fax:  [phone]  Name: LUNSFORD BENNER MRN: [id number] Date of Birth: 04/20/1940

## 2019-07-02 ENCOUNTER — Ambulatory Visit (INDEPENDENT_AMBULATORY_CARE_PROVIDER_SITE_OTHER): Payer: Medicare Other | Admitting: Physical Therapy

## 2019-07-02 ENCOUNTER — Other Ambulatory Visit: Payer: Self-pay

## 2019-07-02 DIAGNOSIS — M6281 Muscle weakness (generalized): Secondary | ICD-10-CM | POA: Diagnosis not present

## 2019-07-02 DIAGNOSIS — M25552 Pain in left hip: Secondary | ICD-10-CM

## 2019-07-02 DIAGNOSIS — R262 Difficulty in walking, not elsewhere classified: Secondary | ICD-10-CM

## 2019-07-02 NOTE — Therapy (Signed)
[place], [place], [address] Phone: [phone]   Fax:  [phone]  Physical Therapy Treatment  Patient Details  Name: Oscar Sparks MRN: [id number] Date of Birth: 1941-03-22 Referring Provider (PT): Dr. Erlinda Hong   Encounter Date: 07/02/2019  PT End of Session - 07/02/19 0828    Visit Number  8    Number of Visits  12    Date for PT Re-Evaluation  07/18/19    PT Start Time  0802    PT Stop Time  0845    PT Time Calculation (min)  43 min    Activity Tolerance  Patient tolerated treatment well    Behavior During Therapy  [facility] for tasks assessed/performed       Past Medical History:  Diagnosis Date  . Arthritis    knees  . Colon cancer ([place]) 09/24/2018  . ED (erectile dysfunction)   . 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 Peripheral vascular disease ([place])    poor curculation in hands and feet hard to monitor with pulse oximetry  . Prostate cancer [facility])    sees Dr. Gaynelle Arabian, received cesium seeds     Past Surgical History:  Procedure Laterality Date  . BACK SURGERY    . SPINE SURGERY  2004   Dr. Sherwood Gambler  . TONSILLECTOMY      There were no vitals filed for this visit.  Subjective Assessment - 07/02/19 0824    Subjective  relays no pain in his left hip and that ROM is improving, His left hip still feels a little weaker and he still needs improvement with balance    Limitations  Standing;Walking    Diagnostic tests  Xray in past.    Patient Stated Goals  Improve pain, walk, maybe continue to work 2-3 days in various options.    Currently in Pain?  No/denies    Pain Onset  More than a month ago         [facility] PT Assessment - 07/02/19 0001      Assessment   Medical Diagnosis  Lt lateral hip pain, bursititis, abductor tendinosis    Referring Provider (PT)  Dr. Erlinda Hong    Onset Date/Surgical Date  11/12/19      Strength   Right Hip Flexion  5/5    Right Hip ABduction  4+/5    Left Hip  Flexion  4+/5    Left Hip ABduction  4/5                   [facility] Adult PT Treatment/Exercise - 07/02/19 0001      Knee/Hip Exercises: Stretches   ITB Stretch  Right;Left;2 reps;30 seconds    Piriformis Stretch  Right;Left;2 reps;30 seconds    Piriformis Stretch Limitations  supine, then moved to sitting for Lt leg stretching 60 sec  X2      Knee/Hip Exercises: Aerobic   Nustep  Lvl 7 10 mins      Knee/Hip Exercises: Standing   SLS with Vectors  SLS clock X 2 bilat    Other Standing Knee Exercises  tandem walk fwd and retro at counter top with intermitt UE support PRN X 3 reps    Other Standing Knee Exercises  lateral walking with L2 band around feet 40 ft up/down      Knee/Hip Exercises: Seated   Other Seated Knee/Hip Exercises  seated IR and seated ER with red 2X10 ea    Sit to [employer]  15 reps;without UE support   red band around knees     Knee/Hip Exercises: Supine   Bridges Limitations  20 reps      Knee/Hip Exercises: Sidelying   Hip ABduction  Left;2 sets;10 reps    Hip ABduction Limitations  manual facilitaiton to keep hips stacked and verbal cues not to move into flexion               PT Short Term Goals - 06/23/19 0847      PT SHORT TERM GOAL #1   Title  Patient will demonstrate independent use of home exercise program to maintain progress from in clinic treatments.    Time  2    Period  Weeks    Status  Achieved    Target Date  06/20/19        PT Long Term Goals - 06/06/19 0832      PT LONG TERM GOAL #1   Title  Patient will demonstrate/report pain at worst less than or equal to 2/10 to facilitate minimal limitation in daily activity secondary to pain symptoms.    Time  6    Period  Weeks    Status  New    Target Date  07/18/19      PT LONG TERM GOAL #2   Title  Patient will demonstrate independent use of home exercise program to facilitate ability to maintain/progress functional gains from skilled physical therapy services.    Time   6    Period  Weeks    Status  New    Target Date  07/18/19      PT LONG TERM GOAL #3   Title  Pt. will demonstrate Lt hip AROM equal to Rt s symptoms to facilitate usual mobility for daily transfers, functional activity at PLOF.    Time  6    Period  Weeks    Status  New    Target Date  07/18/19      PT LONG TERM GOAL #4   Title  Pt. will demonstrate B SLS > 15 seconds to facilitate improved stability in independent ambulation to reduce fall risk.    Time  6    Period  Weeks    Status  New    Target Date  07/18/19      PT LONG TERM GOAL #5   Title  Pt. will demonstrate Lt hip MMT equal to Rt to facilitate usual walking, standing, stairs at PLOF s limitation.    Time  6    Period  Weeks    Status  New    Target Date  07/18/19      Additional Long Term Goals   Additional Long Term Goals  Yes      PT LONG TERM GOAL #6   Title  Pt. will report patient specific functional scale average > 8/10 to indicate minimal disability 2/2 condiiton.    Time  6    Period  Weeks    Status  New    Target Date  07/18/19            Plan - 07/02/19 0858    Clinical Impression Statement  He still lacks some Lt hip strength and ROM ,added sidelying hip abd and seated ER stretch/piriformis stretch today to address these deficits along with added hip abduciton strength today with exercises listed above. He also still lacks a little balance and PT will continue to address this.    Personal Factors and Comorbidities  Comorbidity 3+    Comorbidities  PVD, neuropathy bilaterally, history of colon and prostate cancer    Rehab Potential  Good    PT Frequency  2x / week    PT Duration  6 weeks    PT Treatment/Interventions  ADLs/Self Care Home Management;Electrical Stimulation;Ultrasound;Traction;Moist Heat;Iontophoresis 4mg /ml Dexamethasone;Gait training;Stair training;Functional mobility training;Therapeutic activities;Therapeutic exercise;Balance training;Neuromuscular re-education;Manual  techniques;Patient/family education;Passive range of motion;Dry needling;Joint Manipulations;Spinal Manipulations;Vasopneumatic Device;Taping    PT Next Visit Plan  Movement coordination, static complaint surface and dynamic stability.    PT Home Exercise Plan  [id number]    Consulted and Agree with Plan of Care  Patient       Patient will benefit from skilled therapeutic intervention in order to improve the following deficits and impairments:  Abnormal gait, Decreased coordination, Decreased range of motion, Difficulty walking, Increased fascial restricitons, Decreased endurance, Pain, Decreased balance, Decreased activity tolerance, Hypomobility, Decreased strength, Decreased mobility  Visit Diagnosis: Pain in left hip  Muscle weakness (generalized)  Difficulty in walking, not elsewhere classified     Problem List Patient Active Problem List   Diagnosis Date Noted  . 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 Pain in left hip 06/25/2014  . 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 Prostate cancer ([place]) 11/15/2009  . 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 CONTACT DERMATITIS 09/04/2007    Debbe Odea, PT,DPT 07/02/2019, 9:01 AM  [facility] Physical Therapy [address] [place], [place], [address] Phone: [phone]   Fax:  [phone]  Name: RHOAN MCQUARTER MRN: [id number] Date of Birth: 1941/03/23

## 2019-07-07 ENCOUNTER — Other Ambulatory Visit: Payer: Self-pay

## 2019-07-07 ENCOUNTER — Ambulatory Visit (INDEPENDENT_AMBULATORY_CARE_PROVIDER_SITE_OTHER): Payer: Medicare Other | Admitting: Physical Therapy

## 2019-07-07 DIAGNOSIS — M25552 Pain in left hip: Secondary | ICD-10-CM | POA: Diagnosis not present

## 2019-07-07 DIAGNOSIS — R262 Difficulty in walking, not elsewhere classified: Secondary | ICD-10-CM

## 2019-07-07 DIAGNOSIS — M6281 Muscle weakness (generalized): Secondary | ICD-10-CM

## 2019-07-07 NOTE — Therapy (Signed)
[place], [place], [address] Phone: [phone]   Fax:  [phone]  Physical Therapy Treatment  Patient Details  Name: Oscar Sparks MRN: [id number] Date of Birth: [birth year] Referring Provider (PT): Dr. Erlinda Hong   Encounter Date: 07/07/2019  PT End of Session - 07/07/19 0808    Visit Number  9    Number of Visits  12    Date for PT Re-Evaluation  07/18/19    PT Start Time  0800    PT Stop Time  0845    PT Time Calculation (min)  45 min    Activity Tolerance  Patient tolerated treatment well    Behavior During Therapy  [facility] for tasks assessed/performed       Past Medical History:  Diagnosis Date  . Arthritis    knees  . Colon cancer ([place]) 09/24/2018  . ED (erectile dysfunction)   . 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 Peripheral vascular disease ([place])    poor curculation in hands and feet hard to monitor with pulse oximetry  . Prostate cancer [facility])    sees Dr. Gaynelle Arabian, received cesium seeds     Past Surgical History:  Procedure Laterality Date  . BACK SURGERY    . SPINE SURGERY  2004   Dr. Sherwood Gambler  . TONSILLECTOMY      There were no vitals filed for this visit.  Subjective Assessment - 07/07/19 0806    Subjective  relays hip is getting better a little at a time, denies pain todays    Limitations  Standing;Walking    Diagnostic tests  Xray in past.    Patient Stated Goals  Improve pain, walk, maybe continue to work 2-3 days in various options.    Pain Onset  More than a month ago                       [facility] Adult PT Treatment/Exercise - 07/07/19 0001      Knee/Hip Exercises: Stretches   ITB Stretch  Right;Left;2 reps;30 seconds    ITB Stretch Limitations  supine with strap    Piriformis Stretch Limitations  sitting 30 sec X 3      Knee/Hip Exercises: Aerobic   Recumbent Bike  L3 X 10 min      Knee/Hip Exercises: Standing   Other Standing Knee Exercises   tandem walk fwd and retro in bars with intermitt UE support PRN X 3 reps, standing of airex foam pad for marches without UE support X 15 reps bilat    Other Standing Knee Exercises  lateral walking with L2 band around feet 40 ft up/down. Hip 4 way without UE support but used bars when needed green band 15 reps each plane bilat      Knee/Hip Exercises: Seated   Other Seated Knee/Hip Exercises  seated IR and seated ER with red 2X10 ea      Knee/Hip Exercises: Supine   Single Leg Bridge  Both;2 sets;10 reps    Other Supine Knee/Hip Exercises  clams bilat 2X15 reps, blue band               PT Short Term Goals - 06/23/19 0847      PT SHORT TERM GOAL #1   Title  Patient will demonstrate independent use of home exercise program to maintain progress from in clinic treatments.    Time  2    Period  Weeks  Status  Achieved    Target Date  06/20/19        PT Long Term Goals - 07/07/19 0810      PT LONG TERM GOAL #1   Title  Patient will demonstrate/report pain at worst less than or equal to 2/10 to facilitate minimal limitation in daily activity secondary to pain symptoms.    Status  Achieved      PT LONG TERM GOAL #2   Title  Patient will demonstrate independent use of home exercise program to facilitate ability to maintain/progress functional gains from skilled physical therapy services.    Status  On-going      PT LONG TERM GOAL #3   Title  Pt. will demonstrate Lt hip AROM equal to Rt s symptoms to facilitate usual mobility for daily transfers, functional activity at PLOF.    Status  On-going      PT LONG TERM GOAL #4   Title  Pt. will demonstrate B SLS > 15 seconds to facilitate improved stability in independent ambulation to reduce fall risk.    Status  On-going      PT LONG TERM GOAL #5   Title  Pt. will demonstrate Lt hip MMT equal to Rt to facilitate usual walking, standing, stairs at PLOF s limitation.    Status  On-going      PT LONG TERM GOAL #6   Title  Pt.  will report patient specific functional scale average > 8/10 to indicate minimal disability 2/2 condiiton.    Status  On-going            Plan - 07/07/19 0809    Clinical Impression Statement  Continued with hip strength progression and balane progression as tolerated. He will need MCR progress note next visit.    Personal Factors and Comorbidities  Comorbidity 3+    Comorbidities  PVD, neuropathy bilaterally, history of colon and prostate cancer    Rehab Potential  Good    PT Frequency  2x / week    PT Duration  6 weeks    PT Treatment/Interventions  ADLs/Self Care Home Management;Electrical Stimulation;Ultrasound;Traction;Moist Heat;Iontophoresis 4mg /ml Dexamethasone;Gait training;Stair training;Functional mobility training;Therapeutic activities;Therapeutic exercise;Balance training;Neuromuscular re-education;Manual techniques;Patient/family education;Passive range of motion;Dry needling;Joint Manipulations;Spinal Manipulations;Vasopneumatic Device;Taping    PT Next Visit Plan  Movement coordination, static complaint surface and dynamic stability.    PT Home Exercise Plan  [id number]    Consulted and Agree with Plan of Care  Patient       Patient will benefit from skilled therapeutic intervention in order to improve the following deficits and impairments:  Abnormal gait, Decreased coordination, Decreased range of motion, Difficulty walking, Increased fascial restricitons, Decreased endurance, Pain, Decreased balance, Decreased activity tolerance, Hypomobility, Decreased strength, Decreased mobility  Visit Diagnosis: Pain in left hip  Muscle weakness (generalized)  Difficulty in walking, not elsewhere classified     Problem List Patient Active Problem List   Diagnosis Date Noted  . 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 Pain in left hip 06/25/2014  . Piriformis  syndrome of left side 06/25/2014  . Prostate cancer ([place]) 11/15/2009  . 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 CONTACT DERMATITIS 09/04/2007    Debbe Odea, PT,DPT 07/07/2019, 8:48 AM  [facility] Physical Therapy [address] [place], [place], [address] Phone: [phone]   Fax:  [phone]  Name:  Oscar Sparks MRN: [id number] Date of Birth: October 08, 1940

## 2019-07-09 ENCOUNTER — Other Ambulatory Visit: Payer: Self-pay

## 2019-07-09 ENCOUNTER — Ambulatory Visit (INDEPENDENT_AMBULATORY_CARE_PROVIDER_SITE_OTHER): Payer: Medicare Other | Admitting: Physical Therapy

## 2019-07-09 DIAGNOSIS — M6281 Muscle weakness (generalized): Secondary | ICD-10-CM | POA: Diagnosis not present

## 2019-07-09 DIAGNOSIS — R262 Difficulty in walking, not elsewhere classified: Secondary | ICD-10-CM

## 2019-07-09 DIAGNOSIS — M25552 Pain in left hip: Secondary | ICD-10-CM

## 2019-07-09 NOTE — Therapy (Signed)
[facility] Physical Therapy [address] [place], [place], [address] Phone: [phone]   Fax:  [phone]  Physical Therapy Treatment/Progress note Progress Note reporting period 06/06/19 to 07/09/19  See below for objective and subjective measurements relating to patients progress with PT.    Patient Details  Name: Oscar Sparks MRN: [id number] Date of Birth: 1940-04-08 Referring Provider (PT): Dr. Erlinda Hong   Encounter Date: 07/09/2019  PT End of Session - 07/09/19 0811    Visit Number  10    Number of Visits  12    Date for PT Re-Evaluation  07/18/19    PT Start Time  0800    PT Stop Time  0845    PT Time Calculation (min)  45 min    Activity Tolerance  Patient tolerated treatment well    Behavior During Therapy  [facility] for tasks assessed/performed       Past Medical History:  Diagnosis Date  . Arthritis    knees  . Colon cancer ([place]) 09/24/2018  . ED (erectile dysfunction)   . 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 Peripheral vascular disease ([place])    poor curculation in hands and feet hard to monitor with pulse oximetry  . Prostate cancer [facility])    sees Dr. Gaynelle Arabian, received cesium seeds     Past Surgical History:  Procedure Laterality Date  . BACK SURGERY    . SPINE SURGERY  2004   Dr. Sherwood Gambler  . TONSILLECTOMY      There were no vitals filed for this visit.  Subjective Assessment - 07/09/19 0810    Subjective  I am improving, I feel I have improved 90% with PT.    Currently in Pain?  No/denies         [facility] PT Assessment - 07/09/19 0001      Assessment   Medical Diagnosis  Lt lateral hip pain, bursititis, abductor tendinosis    Referring Provider (PT)  Dr. Erlinda Hong    Onset Date/Surgical Date  11/12/19      Single Leg Stance   Comments  30 sec avg on Rt, 9 sec avg on Lt      AROM   Left Hip External Rotation   35    Left Hip Internal Rotation   35      Strength   Right Hip Flexion  5/5    Left Hip Flexion  4+/5     Left Hip ABduction  4+/5                   [facility] Adult PT Treatment/Exercise - 07/09/19 0001      Knee/Hip Exercises: Stretches   Piriformis Stretch Limitations  sitting 30 sec X 3 bilat    Other Knee/Hip Stretches  supine hip flexor stretch for Lt leg off EOB 30 sec X 3      Knee/Hip Exercises: Aerobic   Recumbent Bike  L3 X 10 min      Knee/Hip Exercises: Standing   SLS with Vectors  SLS clock X 2 bilat    Other Standing Knee Exercises  tandem walk fwd and retro in bars with intermitt UE support PRN X 3 reps, standing of airex foam pad for marches without UE support X 15 reps bilat    Other Standing Knee Exercises  lateral walking with L2 band around feet 20 ft up/down X 2. Hip 4 way without UE support but used bars when needed green band 15 reps each plane  bilat               PT Short Term Goals - 07/09/19 [id number]      PT SHORT TERM GOAL #1   Title  Patient will demonstrate independent use of home exercise program to maintain progress from in clinic treatments.    Time  2    Period  Weeks    Status  Achieved    Target Date  06/20/19        PT Long Term Goals - 07/09/19 [id number]      PT LONG TERM GOAL #1   Title  Patient will demonstrate/report pain at worst less than or equal to 2/10 to facilitate minimal limitation in daily activity secondary to pain symptoms.    Status  Achieved      PT LONG TERM GOAL #2   Title  Patient will demonstrate independent use of home exercise program to facilitate ability to maintain/progress functional gains from skilled physical therapy services.    Status  On-going      PT LONG TERM GOAL #3   Title  Pt. will demonstrate Lt hip AROM equal to Rt s symptoms to facilitate usual mobility for daily transfers, functional activity at PLOF.    Status  On-going      PT LONG TERM GOAL #4   Title  Pt. will demonstrate B SLS > 15 seconds to facilitate improved stability in independent ambulation to reduce fall risk.    Status   On-going      PT LONG TERM GOAL #5   Title  Pt. will demonstrate Lt hip MMT equal to Rt to facilitate usual walking, standing, stairs at PLOF s limitation.    Status  On-going      PT LONG TERM GOAL #6   Title  Pt. will report patient specific functional scale average > 8/10 to indicate minimal disability 2/2 condiiton.    Status  Achieved            Plan - 07/09/19 0855    Clinical Impression Statement  He has made good progress with PT in hip ROM, hip strength, gait, and overall balance. He no longer needs AD for community ambulation. He is not having pain anymore. He feels he has improved 90% overall with PT and now only has mild deficits in hip ROM, hip strength, and SL balance. He feels he would like to come 2 more visits to transition to final HEP.    Personal Factors and Comorbidities  Comorbidity 3+    Comorbidities  PVD, neuropathy bilaterally, history of colon and prostate cancer    Rehab Potential  Good    PT Frequency  2x / week    PT Duration  6 weeks    PT Treatment/Interventions  ADLs/Self Care Home Management;Electrical Stimulation;Ultrasound;Traction;Moist Heat;Iontophoresis 4mg /ml Dexamethasone;Gait training;Stair training;Functional mobility training;Therapeutic activities;Therapeutic exercise;Balance training;Neuromuscular re-education;Manual techniques;Patient/family education;Passive range of motion;Dry needling;Joint Manipulations;Spinal Manipulations;Vasopneumatic Device;Taping    PT Next Visit Plan  Movement coordination, static complaint surface and dynamic stability.    PT Home Exercise Plan  [id number]    Consulted and Agree with Plan of Care  Patient       Patient will benefit from skilled therapeutic intervention in order to improve the following deficits and impairments:  Abnormal gait, Decreased coordination, Decreased range of motion, Difficulty walking, Increased fascial restricitons, Decreased endurance, Pain, Decreased balance, Decreased activity  tolerance, Hypomobility, Decreased strength, Decreased mobility  Visit Diagnosis: Pain in left hip  Muscle weakness (generalized)  Difficulty in walking, not elsewhere classified     Problem List Patient Active Problem List   Diagnosis Date Noted  . 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 Pain in left hip 06/25/2014  . 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 Prostate cancer ([place]) 11/15/2009  . 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 CONTACT DERMATITIS 09/04/2007    Silvestre Mesi 07/09/2019, 8:58 AM  [facility] Physical Therapy [address] [place], [place], [address] Phone: [phone]   Fax:  [phone]  Name: Oscar Sparks MRN: [id number] Date of Birth: Apr 13, 1940

## 2019-07-14 ENCOUNTER — Encounter: Payer: Self-pay | Admitting: Physical Therapy

## 2019-07-14 ENCOUNTER — Ambulatory Visit (INDEPENDENT_AMBULATORY_CARE_PROVIDER_SITE_OTHER): Payer: Medicare Other | Admitting: Physical Therapy

## 2019-07-14 ENCOUNTER — Other Ambulatory Visit: Payer: Self-pay

## 2019-07-14 DIAGNOSIS — M25552 Pain in left hip: Secondary | ICD-10-CM

## 2019-07-14 DIAGNOSIS — M6281 Muscle weakness (generalized): Secondary | ICD-10-CM | POA: Diagnosis not present

## 2019-07-14 DIAGNOSIS — R262 Difficulty in walking, not elsewhere classified: Secondary | ICD-10-CM

## 2019-07-14 NOTE — Therapy (Signed)
[place], [place], [address] Phone: [phone]   Fax:  [phone]  Physical Therapy Treatment  Patient Details  Name: Oscar Sparks MRN: [id number] Date of Birth: July 02, 1940 Referring Provider (PT): Dr. Erlinda Hong   Encounter Date: 07/14/2019  PT End of Session - 07/14/19 0846    Visit Number  11    Number of Visits  12    Date for PT Re-Evaluation  07/18/19    PT Start Time  0800    PT Stop Time  0845    PT Time Calculation (min)  45 min    Activity Tolerance  Patient tolerated treatment well    Behavior During Therapy  [facility] for tasks assessed/performed       Past Medical History:  Diagnosis Date  . Arthritis    knees  . Colon cancer ([place]) 09/24/2018  . ED (erectile dysfunction)   . 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 Peripheral vascular disease ([place])    poor curculation in hands and feet hard to monitor with pulse oximetry  . Prostate cancer [facility])    sees Dr. Gaynelle Arabian, received cesium seeds     Past Surgical History:  Procedure Laterality Date  . BACK SURGERY    . SPINE SURGERY  2004   Dr. Sherwood Gambler  . TONSILLECTOMY      There were no vitals filed for this visit.  Subjective Assessment - 07/14/19 0812    Subjective  Pt arriving to therapy reporting no pain. Pt still reporting mild balance issues. Pt reproting he prepared his garden this past weekend.    Limitations  Standing;Walking    Diagnostic tests  Xray in past.    Patient Stated Goals  Improve pain, walk, maybe continue to work 2-3 days in various options.    Currently in Pain?  No/denies                       [facility] Adult PT Treatment/Exercise - 07/14/19 0001      Knee/Hip Exercises: Stretches   Active Hamstring Stretch  3 reps;20 seconds    Piriformis Stretch Limitations  sitting 30 sec X 3 bilat      Knee/Hip Exercises: Aerobic   Recumbent Bike  L3 X 10 min      Knee/Hip Exercises: Standing   SLS with  Vectors  x 3 point vectors    Other Standing Knee Exercises  braiding alternating forward and back, side stepping, lunge walking all  4x 20 feet    Other Standing Knee Exercises  rocker board, balance board both front/back and side to side               PT Short Term Goals - 07/14/19 [id number]      PT SHORT TERM GOAL #1   Title  Patient will demonstrate independent use of home exercise program to maintain progress from in clinic treatments.    Time  2    Period  Weeks    Status  Achieved        PT Long Term Goals - 07/14/19 [id number]      PT LONG TERM GOAL #1   Title  Patient will demonstrate/report pain at worst less than or equal to 2/10 to facilitate minimal limitation in daily activity secondary to pain symptoms.    Status  Achieved      PT LONG TERM GOAL #2   Title  Patient will demonstrate independent use  of home exercise program to facilitate ability to maintain/progress functional gains from skilled physical therapy services.    Period  Weeks    Status  Achieved      PT LONG TERM GOAL #3   Title  Pt. will demonstrate Lt hip AROM equal to Rt s symptoms to facilitate usual mobility for daily transfers, functional activity at PLOF.    Status  On-going      PT LONG TERM GOAL #4   Title  Pt. will demonstrate B SLS > 15 seconds to facilitate improved stability in independent ambulation to reduce fall risk.    Baseline  R SLS:  6 seconds   L SLS: 8 seconds    Status  On-going      PT LONG TERM GOAL #5   Title  Pt. will demonstrate Lt hip MMT equal to Rt to facilitate usual walking, standing, stairs at PLOF s limitation.    Time  6    Period  Weeks    Status  On-going      PT LONG TERM GOAL #6   Title  Pt. will report patient specific functional scale average > 8/10 to indicate minimal disability 2/2 condiiton.    Status  Achieved            Plan - 07/14/19 0813    Clinical Impression Statement  Pt arriving to therapy reporting today will be next to last day. Pt  requesting updated HEP and progression for last visit this week. Pt has made great progress with strength, still progressing toward dynamci balance. Requiring CGA and close supervision with braiding having difficulty crossing backward and with forward lunges due to balance.  Continue skilled PT one additional visit.    Personal Factors and Comorbidities  Comorbidity 3+    Comorbidities  PVD, neuropathy bilaterally, history of colon and prostate cancer    Rehab Potential  Good    PT Frequency  2x / week    PT Duration  6 weeks    PT Treatment/Interventions  ADLs/Self Care Home Management;Electrical Stimulation;Ultrasound;Traction;Moist Heat;Iontophoresis 4mg /ml Dexamethasone;Gait training;Stair training;Functional mobility training;Therapeutic activities;Therapeutic exercise;Balance training;Neuromuscular re-education;Manual techniques;Patient/family education;Passive range of motion;Dry needling;Joint Manipulations;Spinal Manipulations;Vasopneumatic Device;Taping    PT Next Visit Plan  Movement coordination, static complaint surface and dynamic stability.    PT Home Exercise Plan  [email or web address]    Consulted and Agree with Plan of Care  Patient       Patient will benefit from skilled therapeutic intervention in order to improve the following deficits and impairments:  Abnormal gait, Decreased coordination, Decreased range of motion, Difficulty walking, Increased fascial restricitons, Decreased endurance, Pain, Decreased balance, Decreased activity tolerance, Hypomobility, Decreased strength, Decreased mobility  Visit Diagnosis: Pain in left hip  Muscle weakness (generalized)  Difficulty in walking, not elsewhere classified     Problem List Patient Active Problem List   Diagnosis Date Noted  . 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 Pain in left hip 06/25/2014  . Piriformis syndrome  of left side 06/25/2014  . Prostate cancer ([place]) 11/15/2009  . 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 CONTACT DERMATITIS 09/04/2007    Oretha Caprice, MPT 07/14/2019, 8:46 AM  [facility] Physical Therapy [address] [place], [place], [address] Phone: [phone]   Fax:  [phone]  Name: SHANTA KNEECE MRN: [id number] Date of Birth: 02-27-1941

## 2019-07-16 ENCOUNTER — Other Ambulatory Visit: Payer: Self-pay

## 2019-07-16 ENCOUNTER — Ambulatory Visit (INDEPENDENT_AMBULATORY_CARE_PROVIDER_SITE_OTHER): Payer: Medicare Other | Admitting: Physical Therapy

## 2019-07-16 DIAGNOSIS — M6281 Muscle weakness (generalized): Secondary | ICD-10-CM | POA: Diagnosis not present

## 2019-07-16 DIAGNOSIS — R262 Difficulty in walking, not elsewhere classified: Secondary | ICD-10-CM

## 2019-07-16 DIAGNOSIS — M25552 Pain in left hip: Secondary | ICD-10-CM | POA: Diagnosis not present

## 2019-07-16 NOTE — Therapy (Signed)
[facility] Physical Therapy [address] [place], [place], [address] Phone: [phone]   Fax:  [phone]  Physical Therapy Treatment/Discharge PHYSICAL THERAPY DISCHARGE SUMMARY  Visits from Start of Care: 12  Current functional level related to goals / functional outcomes: See below   Remaining deficits: See below   Education / Equipment: HEP Plan: Patient agrees to discharge.  Patient goals were met. Patient is being discharged due to the patient's request.  ?????       Patient Details  Name: Oscar Sparks MRN: [id number] Date of Birth: [birth year] Referring Provider (PT): Dr. Erlinda Hong   Encounter Date: 07/16/2019  PT End of Session - 07/16/19 0855    Visit Number  12    Number of Visits  12    Date for PT Re-Evaluation  07/18/19    PT Start Time  0800    PT Stop Time  0844    PT Time Calculation (min)  44 min    Activity Tolerance  Patient tolerated treatment well    Behavior During Therapy  [facility] for tasks assessed/performed       Past Medical History:  Diagnosis Date  . Arthritis    knees  . Colon cancer ([place]) 09/24/2018  . ED (erectile dysfunction)   . 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 Peripheral vascular disease ([place])    poor curculation in hands and feet hard to monitor with pulse oximetry  . Prostate cancer [facility])    sees Dr. Gaynelle Arabian, received cesium seeds     Past Surgical History:  Procedure Laterality Date  . BACK SURGERY    . SPINE SURGERY  2004   Dr. Sherwood Gambler  . TONSILLECTOMY      There were no vitals filed for this visit.  Subjective Assessment - 07/16/19 0851    Subjective  no pain, feels ready for discharge    Limitations  Standing;Walking    Diagnostic tests  Xray in past.    Patient Stated Goals  Improve pain, walk, maybe continue to work 2-3 days in various options.    Pain Onset  More than a month ago                       [facility] Adult PT Treatment/Exercise - 07/16/19  0001      Knee/Hip Exercises: Stretches   Piriformis Stretch Limitations  sitting 30 sec X 3 bilat      Knee/Hip Exercises: Aerobic   Recumbent Bike  L3 X 10 min      Knee/Hip Exercises: Standing   SLS with Vectors  x 3 point vectors    Other Standing Knee Exercises  tandem balance, hip 4 way with resistance    Other Standing Knee Exercises  lateral walking with resistance      Knee/Hip Exercises: Seated   Sit to Sand  2 sets;10 reps;without UE support;Other (comment)             PT Education - 07/16/19 0854    Education Details  final HEP review    Person(s) Educated  Patient    Methods  Explanation;Demonstration;Verbal cues;Handout    Comprehension  Verbalized understanding;Returned demonstration       PT Short Term Goals - 07/14/19 8768      PT SHORT TERM GOAL #1   Title  Patient will demonstrate independent use of home exercise program to maintain progress from in clinic treatments.    Time  2  Period  Weeks    Status  Achieved        PT Long Term Goals - 07/16/19 0855      PT LONG TERM GOAL #1   Title  Patient will demonstrate/report pain at worst less than or equal to 2/10 to facilitate minimal limitation in daily activity secondary to pain symptoms.    Status  Achieved      PT LONG TERM GOAL #2   Title  Patient will demonstrate independent use of home exercise program to facilitate ability to maintain/progress functional gains from skilled physical therapy services.    Period  Weeks    Status  Achieved      PT LONG TERM GOAL #3   Title  Pt. will demonstrate Lt hip AROM equal to Rt s symptoms to facilitate usual mobility for daily transfers, functional activity at PLOF.    Status  Partially Met      PT LONG TERM GOAL #4   Title  Pt. will demonstrate B SLS > 15 seconds to facilitate improved stability in independent ambulation to reduce fall risk.    Baseline  R SLS:  6 seconds   L SLS: 8 seconds    Status  Partially Met      PT LONG TERM GOAL #5    Title  Pt. will demonstrate Lt hip MMT equal to Rt to facilitate usual walking, standing, stairs at PLOF s limitation.    Time  6    Period  Weeks    Status  Achieved      PT LONG TERM GOAL #6   Title  Pt. will report patient specific functional scale average > 8/10 to indicate minimal disability 2/2 condiiton.    Status  Achieved            Plan - 07/16/19 0855    Clinical Impression Statement  He feels confident with discharging to HEP. He no longer has pain, he has met almost all of his PT goals and only lacks mild ROM, strength, balance deficits in his Lt hip that are being addressed with his HEP. He has no further questions or concerns with discharge.    Personal Factors and Comorbidities  Comorbidity 3+    Comorbidities  PVD, neuropathy bilaterally, history of colon and prostate cancer    Rehab Potential  Good    PT Frequency  2x / week    PT Duration  6 weeks    PT Treatment/Interventions  ADLs/Self Care Home Management;Electrical Stimulation;Ultrasound;Traction;Moist Heat;Iontophoresis 36m/ml Dexamethasone;Gait training;Stair training;Functional mobility training;Therapeutic activities;Therapeutic exercise;Balance training;Neuromuscular re-education;Manual techniques;Patient/family education;Passive range of motion;Dry needling;Joint Manipulations;Spinal Manipulations;Vasopneumatic Device;Taping    PT Next Visit Plan  Movement coordination, static complaint surface and dynamic stability.    PT Home Exercise Plan  [email or web address]    Consulted and Agree with Plan of Care  Patient       Patient will benefit from skilled therapeutic intervention in order to improve the following deficits and impairments:  Abnormal gait, Decreased coordination, Decreased range of motion, Difficulty walking, Increased fascial restricitons, Decreased endurance, Pain, Decreased balance, Decreased activity tolerance, Hypomobility, Decreased strength, Decreased mobility  Visit Diagnosis: Pain in left  hip  Muscle weakness (generalized)  Difficulty in walking, not elsewhere classified     Problem List Patient Active Problem List   Diagnosis Date Noted  . 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 Pain in left hip  06/25/2014  . 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 Prostate cancer ([place]) 11/15/2009  . 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 CONTACT DERMATITIS 09/04/2007    Silvestre Mesi 07/16/2019, 8:59 AM  [facility] Physical Therapy [address] [place], [place], [address] Phone: [phone]   Fax:  [phone]  Name: ROTH RESS MRN: [id number] Date of Birth: Jan 28, 1941

## 2019-07-16 NOTE — Patient Instructions (Signed)
Access Code: [email or web address]: [email or web address]: 04/21/2021Prepared by: Aaron Edelman NelsonExercises  Supine ITB Stretch with Strap - 1 x daily - 3-4 x weekly - 2-3 reps - 30 hold  Seated Piriformis Stretch with Trunk Bend - 1 x daily - 3-4 x weekly - 1 sets - 2 reps - 30 hold  Supine Bridge - 1 x daily - 3-4 x weekly - 10 reps - 2-3 sets - 2 hold  Clamshell with Resistance - 1 x daily - 3-4 x weekly - 3 sets - 10 reps  Sit to Stand without Arm Support - 1 x daily - 3-4 x weekly - 1-2 sets - 10 reps  Side Stepping with Resistance at Feet - 1 x daily - 3-4 x weekly - 1-2 sets - 10 reps  Standing Hip Extension with Anchored Resistance - 1 x daily - 6 x weekly - 2-3 sets - 10 reps  Standing Hip Abduction with Anchored Resistance - 1 x daily - 3-4 x weekly - 2-3 sets - 10 reps  Standing Hip Adduction with Anchored Resistance - 1 x daily - 3-4 x weekly - 2-3 sets - 10 reps  Standing Hip Flexion with Anchored Resistance and Chair Support - 1 x daily - 3-4 x weekly - 1-2 sets - 10 reps  Walking Tandem Stance - 1 x daily - 3-4 x weekly - 2-3 reps  Single Leg Balance with Clock Reach - 1 x daily - 3-4 x weekly - 1-2 sets - 10 reps

## 2019-07-21 ENCOUNTER — Encounter: Payer: Medicare Other | Admitting: Physical Therapy

## 2019-07-23 ENCOUNTER — Encounter: Payer: Medicare Other | Admitting: Physical Therapy

## 2019-07-28 ENCOUNTER — Encounter: Payer: Medicare Other | Admitting: Physical Therapy

## 2019-07-30 ENCOUNTER — Encounter: Payer: Medicare Other | Admitting: Physical Therapy

## 2019-12-30 DIAGNOSIS — Z20822 Contact with and (suspected) exposure to covid-19: Secondary | ICD-10-CM | POA: Diagnosis not present

## 2020-01-02 DIAGNOSIS — D0362 Melanoma in situ of left upper limb, including shoulder: Secondary | ICD-10-CM | POA: Diagnosis not present

## 2020-01-29 ENCOUNTER — Ambulatory Visit (INDEPENDENT_AMBULATORY_CARE_PROVIDER_SITE_OTHER): Payer: Medicare Other

## 2020-01-29 ENCOUNTER — Other Ambulatory Visit: Payer: Self-pay

## 2020-01-29 ENCOUNTER — Encounter: Payer: Self-pay | Admitting: Orthopaedic Surgery

## 2020-01-29 ENCOUNTER — Ambulatory Visit (INDEPENDENT_AMBULATORY_CARE_PROVIDER_SITE_OTHER): Payer: Medicare Other | Admitting: Orthopaedic Surgery

## 2020-01-29 DIAGNOSIS — R29898 Other symptoms and signs involving the musculoskeletal system: Secondary | ICD-10-CM

## 2020-01-29 DIAGNOSIS — M25552 Pain in left hip: Secondary | ICD-10-CM

## 2020-01-29 DIAGNOSIS — M7632 Iliotibial band syndrome, left leg: Secondary | ICD-10-CM

## 2020-01-29 NOTE — Progress Notes (Signed)
Office Visit Note   Patient: Oscar Sparks           Date of Birth: 1941/01/30           MRN: [id number] Visit Date: 01/29/2020              Requested by: Laurey Morale, MD [place],  [place] [address] PCP: Laurey Morale, MD   Assessment & Plan: Visit Diagnoses:  1. Pain in left hip   2. Iliotibial band syndrome of left side   3. Weakness of left foot     Plan: Left lower extremity weakness with probable lumbar pathology.  We have discussed obtaining an MRI to further assess for structural abnormalities.  The patient would like to discuss this with his wife before proceeding.  He will call and let us know.  Follow-Up Instructions: Return if symptoms worsen or fail to improve.   Orders:  Orders Placed This Encounter  Procedures  . XR HIP UNILAT W OR W/O PELVIS 2-3 VIEWS LEFT  . XR Lumbar Spine 2-3 Views   No orders of the defined types were placed in this encounter.     Procedures: No procedures performed   Clinical Data: No additional findings.   Subjective: Chief Complaint  Patient presents with  . Left Hip - Pain    HPI patient is a pleasant 79 year old gentleman who comes in today with recurrent left hip pain.  Initial injury occurred in August 2020 when he sustained a mechanical fall landing on the left hip.  X-rays were obtained which were negative for fracture.  He was seen in our office and January where a greater trochanter injection was performed.  He had temporary relief following the injection.  He was seen again in March where he was restarted in formal physical therapy.  Around that time he retired and has recently started building a deck.  Over the past several months, he notes that his pain has improved but he has noticed that he has started dragging his left foot more and more specifically when walking on carpet.  He does note mild pain with external rotation of the hip.  No history of lumbar pathology.  No numbness, tingling or  burning to the left lower extremity.  He does take ibuprofen as needed.  No bowel or bladder change.  Review of Systems as detailed in HPI.  All others reviewed and are negative.   Objective: Vital Signs: There were no vitals taken for this visit.  Physical Exam well-developed well-nourished gentleman in no acute distress.  Alert and oriented x3.  Ortho Exam left hip exam shows negative logroll and negative FADIR.  Negative straight leg raise.  He has mild to moderate tenderness to the greater trochanter.  He has increased pain with external rotation of the hip.  He has full strength with resisted hip range of motion as well as with resisted knee and ankle flexion and extension.  He is able to walk on his heels as well as his toes without any problems.  No focal weakness.  He is neurologically intact distally.  Specialty Comments:  No specialty comments available.  Imaging: XR HIP UNILAT W OR W/O PELVIS 2-3 VIEWS LEFT  Result Date: 01/29/2020 Mild degenerative changes with osteophyte formation  XR Lumbar Spine 2-3 Views  Result Date: 01/29/2020 Advanced generative changes with retrolisthesis L1, anterolisthesis L3 and L5    PMFS History: Patient Active Problem List   Diagnosis Date Noted  .  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 Pain in left hip 06/25/2014  . 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 Prostate cancer ([place]) 11/15/2009  . 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 CONTACT DERMATITIS 09/04/2007   Past Medical History:  Diagnosis Date  . Arthritis    knees  . Colon cancer ([place]) 09/24/2018  . ED (erectile dysfunction)   . 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 Peripheral vascular disease ([place])    poor curculation in hands and feet hard to monitor with pulse oximetry   . Prostate cancer [facility])    sees Dr. Gaynelle Arabian, received cesium seeds     Family History  Problem Relation Age of Onset  . Breast cancer Other     Past Surgical History:  Procedure Laterality Date  . BACK SURGERY    . SPINE SURGERY  2004   Dr. Sherwood Gambler  . TONSILLECTOMY     Social History   Occupational History  . Not on file  Tobacco Use  . Smoking status: Never Smoker  . Smokeless tobacco: Never Used  Vaping Use  . Vaping Use: Never used  Substance and Sexual Activity  . Alcohol use: Yes    Alcohol/week: 1.0 standard drink    Types: 1 Glasses of wine per week  . Drug use: No  . Sexual activity: Not on file

## 2020-02-06 ENCOUNTER — Encounter: Payer: Self-pay | Admitting: Family Medicine

## 2020-02-06 ENCOUNTER — Telehealth: Payer: Self-pay | Admitting: Orthopaedic Surgery

## 2020-02-06 NOTE — Telephone Encounter (Signed)
I called and lm on vm to advise of message below. To call once he is ready to schedule.

## 2020-02-06 NOTE — Telephone Encounter (Signed)
Sounds good to me.  He is welcome to schedule it for whenever he wants

## 2020-02-06 NOTE — Telephone Encounter (Signed)
Pt called stating Dr. Erlinda Hong recommended him for a MRI but he would like to hold off on getting that until after 04/10/2020.   [phone]

## 2020-03-23 ENCOUNTER — Telehealth: Payer: Self-pay | Admitting: Orthopaedic Surgery

## 2020-03-23 DIAGNOSIS — M25552 Pain in left hip: Secondary | ICD-10-CM

## 2020-03-23 DIAGNOSIS — R29898 Other symptoms and signs involving the musculoskeletal system: Secondary | ICD-10-CM

## 2020-03-23 NOTE — Telephone Encounter (Signed)
Please advise. Per note, it looks like MRI lumbar? Is this correct?

## 2020-03-23 NOTE — Telephone Encounter (Signed)
MRI left hip and MRI lumbar orders entered.    Sabrina--sending to you as patient would like scheduled after 03/30/2020.

## 2020-03-23 NOTE — Telephone Encounter (Signed)
Can we do hip and lumbar spine?

## 2020-03-23 NOTE — Telephone Encounter (Signed)
Pt called stating he had spoke with Lillia Abed about getting an MRI for his hip done and he would like to proceed with that now; he would like something after 03/31/19 if we could include that in the referral.   [phone]

## 2020-04-20 ENCOUNTER — Ambulatory Visit
Admission: RE | Admit: 2020-04-20 | Discharge: 2020-04-20 | Disposition: A | Discharge status: Home / Self Care | Payer: Medicare Other | Source: Ambulatory Visit | Attending: Physician Assistant | Admitting: Physician Assistant

## 2020-04-20 ENCOUNTER — Other Ambulatory Visit: Payer: Self-pay

## 2020-04-20 DIAGNOSIS — R29898 Other symptoms and signs involving the musculoskeletal system: Secondary | ICD-10-CM

## 2020-04-20 DIAGNOSIS — M545 Low back pain, unspecified: Secondary | ICD-10-CM | POA: Diagnosis not present

## 2020-04-20 DIAGNOSIS — M25552 Pain in left hip: Secondary | ICD-10-CM

## 2020-04-20 DIAGNOSIS — M48061 Spinal stenosis, lumbar region without neurogenic claudication: Secondary | ICD-10-CM | POA: Diagnosis not present

## 2020-04-20 DIAGNOSIS — S76212A Strain of adductor muscle, fascia and tendon of left thigh, initial encounter: Secondary | ICD-10-CM | POA: Diagnosis not present

## 2020-04-20 DIAGNOSIS — M1612 Unilateral primary osteoarthritis, left hip: Secondary | ICD-10-CM | POA: Diagnosis not present

## 2020-04-20 NOTE — Progress Notes (Signed)
F/u to discuss

## 2020-04-23 ENCOUNTER — Ambulatory Visit (INDEPENDENT_AMBULATORY_CARE_PROVIDER_SITE_OTHER): Payer: Medicare Other | Admitting: Orthopaedic Surgery

## 2020-04-23 ENCOUNTER — Ambulatory Visit: Payer: Self-pay

## 2020-04-23 ENCOUNTER — Other Ambulatory Visit: Payer: Self-pay

## 2020-04-23 ENCOUNTER — Encounter: Payer: Self-pay | Admitting: Orthopaedic Surgery

## 2020-04-23 DIAGNOSIS — M25552 Pain in left hip: Secondary | ICD-10-CM

## 2020-04-23 DIAGNOSIS — R29898 Other symptoms and signs involving the musculoskeletal system: Secondary | ICD-10-CM

## 2020-04-23 NOTE — Progress Notes (Signed)
He was referred for diagnostic left hip injection.  He is reluctant to have injection because cortisone has not helped him in the past.  4 months he has been walking with a limp, he believes he is favoring his left leg because his ankle is weak and he feels like he is going to trip if he is not very careful.  He denies any groin pain.  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  Examination reveals surprisingly normal hip range of motion with no pain on flexion and internal rotation.  He has good strength with hip flexion and knee extension, normal strength with ankle plantarflexion.  He is substantially weak with left ankle eversion and moderately weak with dorsiflexion.  No tenderness at the common peroneal nerve at the fibular head.  He walks with a limp, attempting to avoid dragging his left foot.  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  Rather than hip injection, elected to order nerve studies per Dr. Ernestina Patches.  Depending on those results, could contemplate lumbar epidural injection or physical therapy focused on his back, possibly a lumbar brace for support.  He would also benefit from an AFO brace.

## 2020-04-23 NOTE — Progress Notes (Signed)
Office Visit Note   Patient: Oscar Sparks           Date of Birth: 01/18/1941           MRN: [id number] Visit Date: 04/23/2020              Requested by: Laurey Morale, MD [place],  [place] [id number] PCP: Laurey Morale, MD   Assessment & Plan: Visit Diagnoses:  1. Pain in left hip   2. Left leg weakness     Plan: Impression is left leg weakness and mild hip pain following mechanical fall a year and a half ago.  The patient symptoms appear to be from the back and hip.  We have discussed diagnostic and hopefully therapeutic cortisone injection to the left hip joint.  If he does not have relief of symptoms even during the anesthetic phase, we will likely refer him to a back doctor.  Follow-up with [place] as needed.  Follow-Up Instructions: Return if symptoms worsen or fail to improve.   Orders:  Orders Placed This Encounter  Procedures  . US Guided Needle Placement - No Linked Charges   No orders of the defined types were placed in this encounter.     Procedures: No procedures performed   Clinical Data: No additional findings.   Subjective: Chief Complaint  Patient presents with  . Lower Back - Pain, Follow-up  . Left Hip - Pain, Follow-up    HPI a pleasant 80 year old gentleman who comes in today to discuss MRI results of the lumbar spine as well as the left hip.  He has been dealing with left-sided weakness and pain following an injury to the left hip in August 2020 where he sustained a mechanical fall landing on the left side.  He was seen in our office a few months later where trochanteric bursa injection was performed with temporary relief of symptoms.  He was started in physical therapy a few months after.  The pain did seem to improve but he started dragging his foot more and more as time went on.  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  MRI of the lumbar spine showed multilevel  degenerative changes throughout the spine as well as multilevel subarticular and foraminal stenosis changes bilaterally.     Objective: Vital Signs: There were no vitals taken for this visit.    Ortho Exam stable back and hip exams  Specialty Comments:  No specialty comments available.  Imaging: No new imaging   PMFS History: Patient Active Problem List   Diagnosis Date Noted  . 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 Pain in left hip 06/25/2014  . 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 Prostate cancer ([place]) 11/15/2009  . 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 CONTACT DERMATITIS 09/04/2007   Past Medical History:  Diagnosis Date  . Arthritis    knees  . Colon cancer ([place]) 09/24/2018  . ED (erectile dysfunction)   . 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 Peripheral vascular disease ([place])    poor curculation in hands and feet hard to monitor with pulse oximetry  . Prostate cancer [facility])    sees Dr. Gaynelle Arabian, received cesium seeds     Family History  Problem Relation Age of  Onset  . Breast cancer Other     Past Surgical History:  Procedure Laterality Date  . BACK SURGERY    . SPINE SURGERY  2004   Dr. Sherwood Gambler  . TONSILLECTOMY     Social History   Occupational History  . Not on file  Tobacco Use  . Smoking status: Never Smoker  . Smokeless tobacco: Never Used  Vaping Use  . Vaping Use: Never used  Substance and Sexual Activity  . Alcohol use: Yes    Alcohol/week: 1.0 standard drink    Types: 1 Glasses of wine per week  . Drug use: No  . Sexual activity: Not on file

## 2020-06-04 ENCOUNTER — Ambulatory Visit (INDEPENDENT_AMBULATORY_CARE_PROVIDER_SITE_OTHER): Payer: Medicare Other | Admitting: Physical Medicine and Rehabilitation

## 2020-06-04 ENCOUNTER — Other Ambulatory Visit: Payer: Self-pay

## 2020-06-04 ENCOUNTER — Encounter: Payer: Self-pay | Admitting: Physical Medicine and Rehabilitation

## 2020-06-04 DIAGNOSIS — R202 Paresthesia of skin: Secondary | ICD-10-CM | POA: Diagnosis not present

## 2020-06-04 DIAGNOSIS — M21372 Foot drop, left foot: Secondary | ICD-10-CM

## 2020-06-04 NOTE — Procedures (Signed)
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   There is no significant electrodiagnostic evidence of any other focal nerve entrapment lumbar radiculopathy or generalized peripheral neuropathy.   Recommendations: 1.  Follow-up with referring physician. 2.  Continue current management of symptoms. 3.  Suggest surgical evaluation.  ___________________________ Laurence Spates FAAPMR Board Certified, American Board of Physical Medicine and Rehabilitation    Nerve Conduction Studies Motor Summary Table   Stim Site NR Onset (ms) Norm Onset (ms) O-P Amp (mV) Norm O-P Amp Site1 Site2 Delta-0 (ms) Dist (cm) Vel (m/s) Norm Vel (m/s)  Left Fibular Motor (Ext Dig Brev)  26.7C  Ankle    3.0 <6.1 2.8 >2.5 B Fib Ankle 2.9 8.0 *28 >38  B Fib    5.9  2.5         Left Tibial Motor (Abd Hall Brev)  26.5C  Ankle    *8.5 <6.1 *2.0 >3.0 Knee Ankle 13.3 40.5 *30 >35  Knee    21.8  1.4          EMG   Side Muscle Nerve Root Ins Act Fibs Psw Amp Dur Poly Recrt Int Fraser Din Comment  Left AntTibialis Dp Br Peron L4-5 Nml Nml Nml Nml Nml 0 Nml Nml   Left Fibularis Longus  Sup Br Peron L5-S1 *Incr *2+ *2+ Nml Nml 0 Nml *25%   Left MedGastroc Tibial S1-2 Nml Nml Nml Nml Nml 0 Nml Nml   Left VastusMed Femoral L2-4 Nml Nml Nml Nml Nml 0 Nml Nml   Left BicepsFemS Sciatic L5-S1 Nml Nml Nml Nml Nml 0 Nml Nml     Nerve Conduction Studies Motor Left/Right Comparison   Stim Site L Lat (ms) R Lat (ms) L-R Lat (ms) L Amp (mV) R Amp (mV) L-R Amp (%) Site1 Site2 L Vel (m/s) R Vel (m/s) L-R Vel (m/s)  Fibular Motor (Ext Dig Brev)  26.7C  Ankle 3.0   2.8   B Fib Ankle *28    B Fib 5.9   2.5         Tibial Motor (Abd Hall Brev)  26.5C  Ankle *8.5   *2.0   Knee Ankle *30    Knee 21.8   1.4            Waveforms:

## 2020-06-04 NOTE — Progress Notes (Signed)
Oscar Sparks - 80 y.o. male MRN [id number]  Date of birth: 1940-07-02  Office Visit Note: Visit Date: 06/04/2020 PCP: Laurey Morale, MD Referred by: Laurey Morale, MD  Subjective: Chief Complaint  Patient presents with  . Left Foot - Weakness   HPI:  Oscar Sparks is a 80 y.o. male who comes in today at the request of Dr. Eunice Blase for electrodiagnostic study of the Left lower extremity.  He describes chronic worsening weakness of the left ankle and lower extremity.  He calls this a foot drop but in fact does have good dorsiflexion plantarflexion EHL.  He does not walk with a slap foot or hip hiking etc.  His history is a recently saw Dr. [name] in the office and was found to have moderate arthritis of the left hip.  He was seen by Dr. Eunice Blase the same day for possible ultrasound-guided hip injection but the patient did not really want to have the injection done as he had injections in the past that really had not helped.  His biggest concern according to Dr. Junius Roads was the weakness in the left foot.  He has a history of lumbar spine issues.  No prior surgery.  Recent MRI showing multilevel subarticular narrowing but no high-grade stenosis or nerve compression.  No prior electrodiagnostic study.  Dr. Legrand Como hilts subsequently referred him for electrodiagnostic study.  No real numbness or tingling.  No history of diabetes.  No symptoms on the right.  ROS Otherwise per HPI.  Assessment & Plan: Visit Diagnoses:    ICD-10-CM   1. Paresthesia of skin  R20.2 NCV with EMG (electromyography)  2. 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   There is no significant electrodiagnostic evidence of any other focal nerve entrapment lumbar radiculopathy or generalized peripheral neuropathy.    Recommendations: 1.  Follow-up with referring physician. 2.  Continue current management of symptoms. 3.  Suggest surgical evaluation.  Meds & Orders: No orders of the defined types were placed in this encounter.   Orders Placed This Encounter  Procedures  . NCV with EMG (electromyography)    Follow-up: Return in about 2 weeks (around 06/18/2020) for Eunice Blase, MD or [name], MD.   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   There is no significant electrodiagnostic evidence of any other focal nerve entrapment lumbar radiculopathy or generalized peripheral neuropathy.   Recommendations: 1.  Follow-up with referring physician. 2.  Continue current management of symptoms. 3.  Suggest surgical evaluation.  ___________________________ Laurence Spates FAAPMR Board Certified, American Board of Physical Medicine and Rehabilitation    Nerve Conduction Studies Motor Summary Table   Stim Site NR Onset (ms) Norm Onset (ms) O-P Amp (mV) Norm O-P Amp Site1 Site2 Delta-0 (ms) Dist (cm) Vel (m/s) Norm Vel (m/s)  Left Fibular Motor (Ext Dig Brev)  26.7C  Ankle    3.0 <6.1 2.8 >2.5 B Fib Ankle 2.9 8.0 *28 >38  B Fib    5.9  2.5         Left Tibial Motor (Abd Hall Brev)  26.5C  Ankle    *8.5 <6.1 *2.0 >3.0 Knee Ankle 13.3  40.5 *30 >35  Knee    21.8  1.4          EMG   Side Muscle Nerve Root Ins Act Fibs Psw Amp Dur Poly Recrt Int Fraser Din Comment  Left AntTibialis Dp Br Peron L4-5 Nml Nml Nml Nml Nml 0 Nml Nml   Left Fibularis Longus  Sup Br Peron L5-S1 *Incr *2+ *2+ Nml Nml 0 Nml *25%   Left MedGastroc Tibial S1-2 Nml Nml Nml Nml Nml 0 Nml Nml   Left VastusMed Femoral L2-4 Nml Nml Nml Nml Nml 0 Nml Nml   Left BicepsFemS Sciatic L5-S1 Nml Nml Nml Nml Nml 0 Nml Nml     Nerve Conduction Studies Motor Left/Right  Comparison   Stim Site L Lat (ms) R Lat (ms) L-R Lat (ms) L Amp (mV) R Amp (mV) L-R Amp (%) Site1 Site2 L Vel (m/s) R Vel (m/s) L-R Vel (m/s)  Fibular Motor (Ext Dig Brev)  26.7C  Ankle 3.0   2.8   B Fib Ankle *28    B Fib 5.9   2.5         Tibial Motor (Abd Hall Brev)  26.5C  Ankle *8.5   *2.0   Knee Ankle *30    Knee 21.8   1.4            Waveforms:        Clinical History: MRI LUMBAR SPINE WITHOUT CONTRAST  TECHNIQUE: Multiplanar, multisequence MR imaging of the lumbar spine was performed. No intravenous contrast was administered.  COMPARISON:  Lumbar spine radiographs 01/29/2020.  FINDINGS: Segmentation:  Normal  Alignment: Mild retrolisthesis T12-L1, L1-2, L4-5. Mild anterolisthesis L5-S1.  Vertebrae: Negative for fracture or mass. Hemangioma L3 vertebral body. Discogenic edema on the left at L4-5. Diffuse discogenic edema the bone marrow at L5-S1.  Conus medullaris and cauda equina: Conus extends to the L1 level. Conus and cauda equina appear normal.  Paraspinal and other soft tissues: Negative for paraspinous mass or adenopathy.  Disc levels:  T12-L1: Disc degeneration with diffuse disc bulging and small central disc protrusion. Negative for stenosis  L1-2: Moderate disc degeneration with disc space narrowing and diffuse endplate spurring right greater than left. Moderate to severe subarticular and foraminal stenosis on the right. Moderate left subarticular stenosis  L2-3: Disc degeneration with disc space narrowing and spurring left greater than right. Bilateral mild facet degeneration. Moderate subarticular stenosis bilaterally left greater than right.  L3-4: Disc degeneration with disc bulging and mild spurring. Moderate facet degeneration. Mild subarticular stenosis bilaterally  L4-5: Right laminectomy. Moderate disc degeneration with disc space narrowing and spurring. Discogenic bone marrow edema on the left. Moderate  subarticular and foraminal stenosis bilaterally  L5-S1: Disc degeneration with disc space narrowing. Diffuse discogenic edema. Diffuse disc bulging and bilateral facet degeneration. Moderate subarticular and foraminal stenosis bilaterally.  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 Multilevel subarticular and foraminal stenosis bilaterally as described above. Spinal canal adequate in size.   Electronically Signed   By: Franchot Gallo M.D.   On: 04/20/2020 09:56     Objective:  VS:  HT:    WT:   BMI:     BP:   HR: bpm  TEMP: ( )  RESP:  Physical Exam Musculoskeletal:        General: No tenderness.     Comments: Inspection reveals intrinsic foot muscle atrophy which can be normal for age possibly little bit more left than right.  This includes the EDB muscle.  Sensation over the dorsum and first webspace seems to be intact but somewhat diminished left to right.  There is significant prominence of the left lateral knee and fibular head.  There is a negative Tinel's sign over this area.  There is no swelling, color changes, allodynia or dystrophic changes.  There is 5 out of 5 strength with EHL dorsiflexion plantarflexion but there does seem some weakness with eversion on the left compared to right.  Skin:    General: Skin is warm and dry.     Findings: No erythema or rash.  Neurological:     General: No focal deficit present.     Mental Status: He is alert and oriented to person, place, and time.     Sensory: No sensory deficit.     Motor: Weakness present. No abnormal muscle tone.     Coordination: Coordination normal.     Gait: Gait abnormal.  Psychiatric:        Mood and Affect: Mood normal.        Behavior: Behavior normal.        Thought Content: Thought content normal.      Imaging: No results found.

## 2020-06-04 NOTE — Progress Notes (Signed)
Left foot drop per patient. Trips when walking on rugs. No pain.

## 2020-06-18 ENCOUNTER — Ambulatory Visit (INDEPENDENT_AMBULATORY_CARE_PROVIDER_SITE_OTHER): Payer: Medicare Other | Admitting: Orthopaedic Surgery

## 2020-06-18 ENCOUNTER — Encounter: Payer: Self-pay | Admitting: Orthopaedic Surgery

## 2020-06-18 DIAGNOSIS — G5732 Lesion of lateral popliteal nerve, left lower limb: Secondary | ICD-10-CM | POA: Diagnosis not present

## 2020-06-18 NOTE — Progress Notes (Signed)
Office Visit Note   Patient: Oscar Sparks           Date of Birth: Jul 29, 1940           MRN: [id number] Visit Date: 06/18/2020              Requested by: Laurey Morale, MD [place],  [place] [id number] PCP: Laurey Morale, MD   Assessment & Plan: Visit Diagnoses:  1. Peroneal neuropathy at knee, left     Plan: Nerve conduction studies reviewed with the patient and his wife in detail which shows severe compressive left peroneal nerve neuropathy.  These findings were reviewed with the patient.  Recommendation is for neurolysis of the peroneal nerve in the near future.  We will also give him a prescription to [facility] for an AFO.  Questions encouraged and answered about the surgery.  Total face to face encounter time was greater than 25 minutes and over half of this time was spent in counseling and/or coordination of care.  Follow-Up Instructions: Return if symptoms worsen or fail to improve.   Orders:  No orders of the defined types were placed in this encounter.  No orders of the defined types were placed in this encounter.     Procedures: No procedures performed   Clinical Data: No additional findings.   Subjective: Chief Complaint  Patient presents with  . Left Foot - Pain    [name] comes in today for follow-up status post recent nerve conduction studies for left foot drop.  He was referred to Dr. Ernestina Patches by Dr. Junius Roads for this concern.  He had a prior MRI of his lumbar spine as well.  He ambulates with a cane and has had several episodes of falling due to his toes catching on the floor or rug.  He states that he has decreased sensation in his entire left foot.   Review of Systems  Constitutional: Negative.   All other systems reviewed and are negative.    Objective: Vital Signs: There were no vitals taken for this visit.  Physical Exam Vitals and nursing note reviewed.  Constitutional:      Appearance: He is well-developed.   Pulmonary:     Effort: Pulmonary effort is normal.  Abdominal:     Palpations: Abdomen is soft.  Skin:    General: Skin is warm.  Neurological:     Mental Status: He is alert and oriented to person, place, and time.  Psychiatric:        Behavior: Behavior normal.        Thought Content: Thought content normal.        Judgment: Judgment normal.     Ortho Exam Left knee shows full range of motion.  Mildly positive Tinel's around the fibular head.  Peroneal nerve is stable.  He has 3/5 ankle dorsiflexion strength.  Decreased sensation in the peroneal nerve distribution.  Strong dorsalis pedis pulse. Specialty Comments:  No specialty comments available.  Imaging: No results found.   PMFS History: Patient Active Problem List   Diagnosis Date Noted  . 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 Pain in left hip 06/25/2014  . 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 Prostate cancer ([place]) 11/15/2009  . 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 CONTACT DERMATITIS 09/04/2007   Past  Medical History:  Diagnosis Date  . Arthritis    knees  . Colon cancer ([place]) 09/24/2018  . ED (erectile dysfunction)   . 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 Peripheral vascular disease ([place])    poor curculation in hands and feet hard to monitor with pulse oximetry  . Prostate cancer [facility])    sees Dr. Gaynelle Arabian, received cesium seeds     Family History  Problem Relation Age of Onset  . Breast cancer Other     Past Surgical History:  Procedure Laterality Date  . BACK SURGERY    . SPINE SURGERY  2004   Dr. Sherwood Gambler  . TONSILLECTOMY     Social History   Occupational History  . Not on file  Tobacco Use  . Smoking status: Never Smoker  . Smokeless tobacco: Never Used  Vaping Use  . Vaping Use:  Never used  Substance and Sexual Activity  . Alcohol use: Yes    Alcohol/week: 1.0 standard drink    Types: 1 Glasses of wine per week  . Drug use: No  . Sexual activity: Not on file

## 2020-06-21 ENCOUNTER — Encounter (HOSPITAL_BASED_OUTPATIENT_CLINIC_OR_DEPARTMENT_OTHER): Payer: Self-pay | Admitting: Orthopaedic Surgery

## 2020-06-21 ENCOUNTER — Other Ambulatory Visit: Payer: Self-pay

## 2020-06-21 ENCOUNTER — Other Ambulatory Visit (HOSPITAL_COMMUNITY)
Admission: RE | Admit: 2020-06-21 | Discharge: 2020-06-21 | Disposition: A | Discharge status: Home / Self Care | Payer: Medicare Other | Source: Ambulatory Visit | Attending: Orthopaedic Surgery | Admitting: Orthopaedic Surgery

## 2020-06-21 DIAGNOSIS — Z20822 Contact with and (suspected) exposure to covid-19: Secondary | ICD-10-CM | POA: Diagnosis not present

## 2020-06-21 DIAGNOSIS — Z01812 Encounter for preprocedural laboratory examination: Secondary | ICD-10-CM | POA: Diagnosis not present

## 2020-06-21 LAB — SARS CORONAVIRUS 2 (TAT 6-24 HRS): SARS Coronavirus 2: NEGATIVE

## 2020-06-22 ENCOUNTER — Telehealth: Payer: Self-pay

## 2020-06-22 ENCOUNTER — Encounter (HOSPITAL_BASED_OUTPATIENT_CLINIC_OR_DEPARTMENT_OTHER)
Admission: RE | Admit: 2020-06-22 | Discharge: 2020-06-22 | Disposition: A | Discharge status: Home / Self Care | Payer: Medicare Other | Source: Ambulatory Visit | Attending: Orthopaedic Surgery | Admitting: Orthopaedic Surgery

## 2020-06-22 DIAGNOSIS — Z01812 Encounter for preprocedural laboratory examination: Secondary | ICD-10-CM | POA: Insufficient documentation

## 2020-06-22 LAB — BASIC METABOLIC PANEL
Anion gap: 6 (ref 5–15)
BUN: 15 mg/dL (ref 8–23)
CO2: 32 mmol/L (ref 22–32)
Calcium: 9.5 mg/dL (ref 8.9–10.3)
Chloride: 102 mmol/L (ref 98–111)
Creatinine, Ser: 0.86 mg/dL (ref 0.61–1.24)
GFR, Estimated: >60 mL/min (ref >60)
Glucose, Bld: 93 mg/dL (ref 70–99)
Potassium: 4.3 mmol/L (ref 3.5–5.1)
Sodium: 140 mmol/L (ref 135–145)

## 2020-06-22 LAB — CBC
HCT: 44.7 % (ref 39.0–52.0)
Hemoglobin: 15.1 g/dL (ref 13.0–17.0)
MCH: 31.9 pg (ref 26.0–34.0)
MCHC: 33.8 g/dL (ref 30.0–36.0)
MCV: 94.5 fL (ref 80.0–100.0)
Platelets: 242 10*3/uL (ref 150–400)
RBC: 4.73 MIL/uL (ref 4.22–5.81)
RDW: 12.3 % (ref 11.5–15.5)
WBC: 7.7 10*3/uL (ref 4.0–10.5)
nRBC: 0 % (ref 0.0–0.2)

## 2020-06-22 NOTE — Progress Notes (Signed)

## 2020-06-22 NOTE — Telephone Encounter (Signed)
Sharyn Lull with [facility] wanted to know if forms had been received and signed for patient.  Cb# [phone].  Fax# [phone].  Please advise.  Thank you.

## 2020-06-23 ENCOUNTER — Ambulatory Visit (HOSPITAL_BASED_OUTPATIENT_CLINIC_OR_DEPARTMENT_OTHER): Payer: Medicare Other | Admitting: Anesthesiology

## 2020-06-23 ENCOUNTER — Other Ambulatory Visit: Payer: Self-pay

## 2020-06-23 ENCOUNTER — Encounter (HOSPITAL_BASED_OUTPATIENT_CLINIC_OR_DEPARTMENT_OTHER): Payer: Self-pay | Admitting: Orthopaedic Surgery

## 2020-06-23 ENCOUNTER — Encounter (HOSPITAL_BASED_OUTPATIENT_CLINIC_OR_DEPARTMENT_OTHER)
Admission: RE | Disposition: A | Discharge status: Home / Self Care | Payer: Self-pay | Source: Home / Self Care | Attending: Orthopaedic Surgery

## 2020-06-23 ENCOUNTER — Ambulatory Visit (HOSPITAL_BASED_OUTPATIENT_CLINIC_OR_DEPARTMENT_OTHER)
Admission: RE | Admit: 2020-06-23 | Discharge: 2020-06-23 | Disposition: A | Discharge status: Home / Self Care | Payer: Medicare Other | Attending: Orthopaedic Surgery | Admitting: Orthopaedic Surgery

## 2020-06-23 DIAGNOSIS — M21372 Foot drop, left foot: Secondary | ICD-10-CM | POA: Diagnosis not present

## 2020-06-23 DIAGNOSIS — Z8546 Personal history of malignant neoplasm of prostate: Secondary | ICD-10-CM | POA: Insufficient documentation

## 2020-06-23 DIAGNOSIS — G5702 Lesion of sciatic nerve, left lower limb: Secondary | ICD-10-CM

## 2020-06-23 DIAGNOSIS — Z20822 Contact with and (suspected) exposure to covid-19: Secondary | ICD-10-CM | POA: Diagnosis not present

## 2020-06-23 DIAGNOSIS — Y939 Activity, unspecified: Secondary | ICD-10-CM | POA: Diagnosis not present

## 2020-06-23 DIAGNOSIS — Z803 Family history of malignant neoplasm of breast: Secondary | ICD-10-CM | POA: Insufficient documentation

## 2020-06-23 DIAGNOSIS — Z85038 Personal history of other malignant neoplasm of large intestine: Secondary | ICD-10-CM | POA: Insufficient documentation

## 2020-06-23 DIAGNOSIS — S8412XA Injury of peroneal nerve at lower leg level, left leg, initial encounter: Secondary | ICD-10-CM | POA: Insufficient documentation

## 2020-06-23 DIAGNOSIS — Z9109 Other allergy status, other than to drugs and biological substances: Secondary | ICD-10-CM | POA: Diagnosis not present

## 2020-06-23 DIAGNOSIS — G629 Polyneuropathy, unspecified: Secondary | ICD-10-CM | POA: Diagnosis not present

## 2020-06-23 DIAGNOSIS — G5732 Lesion of lateral popliteal nerve, left lower limb: Secondary | ICD-10-CM | POA: Diagnosis not present

## 2020-06-23 DIAGNOSIS — Z791 Long term (current) use of non-steroidal anti-inflammatories (NSAID): Secondary | ICD-10-CM | POA: Insufficient documentation

## 2020-06-23 DIAGNOSIS — D509 Iron deficiency anemia, unspecified: Secondary | ICD-10-CM | POA: Diagnosis not present

## 2020-06-23 DIAGNOSIS — X58XXXA Exposure to other specified factors, initial encounter: Secondary | ICD-10-CM | POA: Insufficient documentation

## 2020-06-23 HISTORY — PX: SUPERFICIAL PERONEAL NERVE RELEASE: SHX6200

## 2020-06-23 SURGERY — DECOMPRESSION, NERVE, SUPERFICIAL PERONEAL
Anesthesia: General | Site: Leg Upper | Laterality: Left

## 2020-06-23 MED ORDER — FENTANYL CITRATE (PF) 100 MCG/2ML IJ SOLN
INTRAMUSCULAR | Status: AC
  Filled 2020-06-23: qty 2

## 2020-06-23 MED ORDER — PROPOFOL 10 MG/ML IV BOLUS
INTRAVENOUS | Status: DC | PRN
  Administered 2020-06-23: 120 mg via INTRAVENOUS

## 2020-06-23 MED ORDER — LIDOCAINE 2% (20 MG/ML) 5 ML SYRINGE
INTRAMUSCULAR | Status: DC | PRN
  Administered 2020-06-23: 60 mg via INTRAVENOUS

## 2020-06-23 MED ORDER — POVIDONE-IODINE 7.5 % EX SOLN
Freq: Once | CUTANEOUS | Status: DC
  Filled 2020-06-23: qty 118

## 2020-06-23 MED ORDER — LIDOCAINE 2% (20 MG/ML) 5 ML SYRINGE
INTRAMUSCULAR | Status: AC
  Filled 2020-06-23: qty 5

## 2020-06-23 MED ORDER — DEXAMETHASONE SODIUM PHOSPHATE 10 MG/ML IJ SOLN
INTRAMUSCULAR | Status: AC
  Filled 2020-06-23: qty 1

## 2020-06-23 MED ORDER — CEFAZOLIN SODIUM-DEXTROSE 2-4 GM/100ML-% IV SOLN
INTRAVENOUS | Status: AC
  Filled 2020-06-23: qty 100

## 2020-06-23 MED ORDER — POVIDONE-IODINE 10 % EX SWAB: 2.0000 "application " | Freq: Once | CUTANEOUS | Status: DC

## 2020-06-23 MED ORDER — HYDROCODONE-ACETAMINOPHEN 5-325 MG PO TABS: 1.0000 | ORAL_TABLET | Freq: Four times a day (QID) | ORAL | 0 refills | Status: DC | PRN

## 2020-06-23 MED ORDER — 0.9 % SODIUM CHLORIDE (POUR BTL) OPTIME
TOPICAL | Status: DC | PRN
  Administered 2020-06-23: 1000 mL

## 2020-06-23 MED ORDER — CEFAZOLIN SODIUM-DEXTROSE 2-4 GM/100ML-% IV SOLN
2.0000 g | INTRAVENOUS | Status: AC
  Administered 2020-06-23: 2 g via INTRAVENOUS

## 2020-06-23 MED ORDER — PROPOFOL 10 MG/ML IV BOLUS
INTRAVENOUS | Status: AC
  Filled 2020-06-23: qty 20

## 2020-06-23 MED ORDER — BUPIVACAINE HCL (PF) 0.5 % IJ SOLN
INTRAMUSCULAR | Status: DC | PRN
  Administered 2020-06-23: 15 mL

## 2020-06-23 MED ORDER — FENTANYL CITRATE (PF) 100 MCG/2ML IJ SOLN
INTRAMUSCULAR | Status: DC | PRN
  Administered 2020-06-23: 50 ug via INTRAVENOUS

## 2020-06-23 MED ORDER — DEXAMETHASONE SODIUM PHOSPHATE 10 MG/ML IJ SOLN
INTRAMUSCULAR | Status: DC | PRN
  Administered 2020-06-23: 4 mg via INTRAVENOUS

## 2020-06-23 MED ORDER — LACTATED RINGERS IV SOLN: INTRAVENOUS | Status: DC

## 2020-06-23 MED ORDER — ONDANSETRON HCL 4 MG/2ML IJ SOLN: 4.0000 mg | Freq: Once | INTRAMUSCULAR | Status: DC | PRN

## 2020-06-23 MED ORDER — ONDANSETRON HCL 4 MG/2ML IJ SOLN
INTRAMUSCULAR | Status: AC
  Filled 2020-06-23: qty 2

## 2020-06-23 MED ORDER — FENTANYL CITRATE (PF) 100 MCG/2ML IJ SOLN: 25.0000 ug | INTRAMUSCULAR | Status: DC | PRN

## 2020-06-23 MED ORDER — ONDANSETRON HCL 4 MG/2ML IJ SOLN
INTRAMUSCULAR | Status: DC | PRN
  Administered 2020-06-23: 4 mg via INTRAVENOUS

## 2020-06-23 SURGICAL SUPPLY — 64 items
BANDAGE ESMARK 6X9 LF (GAUZE/BANDAGES/DRESSINGS) ×1 IMPLANT
BLADE HEX COATED 2.75 (ELECTRODE) ×2 IMPLANT
BLADE SURG 15 STRL LF DISP TIS (BLADE) ×2 IMPLANT
BLADE SURG 15 STRL SS (BLADE) ×4
BNDG CMPR 9X6 STRL LF SNTH (GAUZE/BANDAGES/DRESSINGS) ×1
BNDG ELASTIC 4X5.8 VLCR STR LF (GAUZE/BANDAGES/DRESSINGS) IMPLANT
BNDG ESMARK 6X9 LF (GAUZE/BANDAGES/DRESSINGS) ×2
CANISTER SUCT 1200ML W/VALVE (MISCELLANEOUS) ×2 IMPLANT
COVER BACK TABLE 60X90IN (DRAPES) ×2 IMPLANT
COVER WAND RF STERILE (DRAPES) IMPLANT
CUFF TOURN SGL QUICK 34 (TOURNIQUET CUFF)
CUFF TRNQT CYL 34X4.125X (TOURNIQUET CUFF) IMPLANT
DECANTER SPIKE VIAL GLASS SM (MISCELLANEOUS) IMPLANT
DRAPE EXTREMITY T 121X128X90 (DISPOSABLE) ×2 IMPLANT
DRAPE IMP U-DRAPE 54X76 (DRAPES) ×2 IMPLANT
DRAPE OEC MINIVIEW 54X84 (DRAPES) ×2 IMPLANT
DRAPE SURG 17X23 STRL (DRAPES) ×4 IMPLANT
DRSG PAD ABDOMINAL 8X10 ST (GAUZE/BANDAGES/DRESSINGS) ×4 IMPLANT
DURAPREP 26ML APPLICATOR (WOUND CARE) ×2 IMPLANT
ELECT REM PT RETURN 9FT ADLT (ELECTROSURGICAL) ×2
ELECTRODE REM PT RTRN 9FT ADLT (ELECTROSURGICAL) ×1 IMPLANT
GAUZE SPONGE 4X4 12PLY STRL (GAUZE/BANDAGES/DRESSINGS) ×2 IMPLANT
GAUZE XEROFORM 1X8 LF (GAUZE/BANDAGES/DRESSINGS) ×2 IMPLANT
GLOVE SURG LTX SZ7 (GLOVE) ×2 IMPLANT
GLOVE SURG NEOP MICRO LF SZ7.5 (GLOVE) ×2 IMPLANT
GLOVE SURG SYN 7.5  E (GLOVE) ×2
GLOVE SURG SYN 7.5 E (GLOVE) ×1 IMPLANT
GLOVE SURG UNDER POLY LF SZ7 (GLOVE) ×2 IMPLANT
GOWN STRL REIN XL XLG (GOWN DISPOSABLE) ×2 IMPLANT
GOWN STRL REUS W/ TWL LRG LVL3 (GOWN DISPOSABLE) ×1 IMPLANT
GOWN STRL REUS W/ TWL XL LVL3 (GOWN DISPOSABLE) ×1 IMPLANT
GOWN STRL REUS W/TWL LRG LVL3 (GOWN DISPOSABLE) ×2
GOWN STRL REUS W/TWL XL LVL3 (GOWN DISPOSABLE) ×2
LOOP VESSEL MINI RED (MISCELLANEOUS) ×2 IMPLANT
NEEDLE HYPO 22GX1.5 SAFETY (NEEDLE) IMPLANT
NS IRRIG 1000ML POUR BTL (IV SOLUTION) ×2 IMPLANT
PACK BASIN DAY SURGERY FS (CUSTOM PROCEDURE TRAY) ×2 IMPLANT
PAD CAST 3X4 CTTN HI CHSV (CAST SUPPLIES) IMPLANT
PAD CAST 4YDX4 CTTN HI CHSV (CAST SUPPLIES) IMPLANT
PADDING CAST COTTON 3X4 STRL (CAST SUPPLIES)
PADDING CAST COTTON 4X4 STRL (CAST SUPPLIES)
PADDING CAST SYN 6 (CAST SUPPLIES)
PADDING CAST SYNTHETIC 4 (CAST SUPPLIES)
PADDING CAST SYNTHETIC 4X4 STR (CAST SUPPLIES) IMPLANT
PADDING CAST SYNTHETIC 6X4 NS (CAST SUPPLIES) IMPLANT
PENCIL SMOKE EVACUATOR (MISCELLANEOUS) ×2 IMPLANT
SHEET MEDIUM DRAPE 40X70 STRL (DRAPES) ×4 IMPLANT
SLEEVE SCD COMPRESS KNEE MED (STOCKING) ×2 IMPLANT
SPLINT FIBERGLASS 3X35 (CAST SUPPLIES) IMPLANT
SPLINT FIBERGLASS 4X30 (CAST SUPPLIES) IMPLANT
SPONGE LAP 18X18 RF (DISPOSABLE) ×2 IMPLANT
SUCTION FRAZIER HANDLE 10FR (MISCELLANEOUS) ×2
SUCTION TUBE FRAZIER 10FR DISP (MISCELLANEOUS) ×1 IMPLANT
SUT ETHILON 3 0 PS 1 (SUTURE) IMPLANT
SUT VIC AB 0 CT1 27 (SUTURE)
SUT VIC AB 0 CT1 27XBRD ANBCTR (SUTURE) IMPLANT
SUT VIC AB 2-0 CT1 27 (SUTURE) ×2
SUT VIC AB 2-0 CT1 TAPERPNT 27 (SUTURE) ×1 IMPLANT
SYR BULB EAR ULCER 3OZ GRN STR (SYRINGE) ×2 IMPLANT
SYR CONTROL 10ML LL (SYRINGE) IMPLANT
TOWEL GREEN STERILE FF (TOWEL DISPOSABLE) ×2 IMPLANT
TUBE CONNECTING 20X1/4 (TUBING) ×2 IMPLANT
UNDERPAD 30X36 HEAVY ABSORB (UNDERPADS AND DIAPERS) ×2 IMPLANT
YANKAUER SUCT BULB TIP NO VENT (SUCTIONS) ×2 IMPLANT

## 2020-06-23 NOTE — Discharge Instructions (Signed)
Postoperative instructions:  Weightbearing instructions: as tolerated to the leg  Keep your dressing and/or splint clean and dry at all times.  You can remove your dressing on post-operative day #3 and change with a dry/sterile dressing or Band-Aids as needed thereafter.    Incision instructions:  Do not soak your incision for 3 weeks after surgery.  If the incision gets wet, pat dry and do not scrub the incision.  Pain control:  You have been given a prescription to be taken as directed for post-operative pain control.  In addition, elevate the operative extremity above the heart at all times to prevent swelling and throbbing pain.  Take over-the-counter Colace, 100mg  by mouth twice a day while taking narcotic pain medications to help prevent constipation.  Follow up appointments: 1) 14 days for suture removal and wound check. 2) Dr. Erlinda Hong as scheduled.   -------------------------------------------------------------------------------------------------------------  After Surgery Pain Control:  After your surgery, post-surgical discomfort or pain is likely. This discomfort can last several days to a few weeks. At certain times of the day your discomfort may be more intense.  Did you receive a nerve block?  A nerve block can provide pain relief for one hour to two days after your surgery. As long as the nerve block is working, you will experience little or no sensation in the area the surgeon operated on.  As the nerve block wears off, you will begin to experience pain or discomfort. It is very important that you begin taking your prescribed pain medication before the nerve block fully wears off. Treating your pain at the first sign of the block wearing off will ensure your pain is better controlled and more tolerable when full-sensation returns. Do not wait until the pain is intolerable, as the medicine will be less effective. It is better to treat pain in advance than to try and catch up.   General Anesthesia:  If you did not receive a nerve block during your surgery, you will need to start taking your pain medication shortly after your surgery and should continue to do so as prescribed by your surgeon.  Pain Medication:  Most commonly we prescribe Vicodin and Percocet for post-operative pain. Both of these medications contain a combination of acetaminophen (Tylenol) and a narcotic to help control pain.   It takes between 30 and 45 minutes before pain medication starts to work. It is important to take your medication before your pain level gets too intense.   Nausea is a common side effect of many pain medications. You will want to eat something before taking your pain medicine to help prevent nausea.   If you are taking a prescription pain medication that contains acetaminophen, we recommend that you do not take additional over the counter acetaminophen (Tylenol).  Other pain relieving options:   Using a cold pack to ice the affected area a few times a day (15 to 20 minutes at a time) can help to relieve pain, reduce swelling and bruising.   Elevation of the affected area can also help to reduce pain and swelling.  Post Anesthesia Home Care Instructions  Activity: Get plenty of rest for the remainder of the day. A responsible individual must stay with you for 24 hours following the procedure.  For the next 24 hours, DO NOT: -Drive a car -Paediatric nurse -Drink alcoholic beverages -Take any medication unless instructed by your physician -Make any legal decisions or sign important papers.  Meals: Start with liquid foods such as gelatin  or soup. Progress to regular foods as tolerated. Avoid greasy, spicy, heavy foods. If nausea and/or vomiting occur, drink only clear liquids until the nausea and/or vomiting subsides. Call your physician if vomiting continues.  Special Instructions/Symptoms: Your throat may feel dry or sore from the anesthesia or the breathing tube  placed in your throat during surgery. If this causes discomfort, gargle with warm salt water. The discomfort should disappear within 24 hours.  If you had a scopolamine patch placed behind your ear for the management of post- operative nausea and/or vomiting:  1. The medication in the patch is effective for 72 hours, after which it should be removed.  Wrap patch in a tissue and discard in the trash. Wash hands thoroughly with soap and water. 2. You may remove the patch earlier than 72 hours if you experience unpleasant side effects which may include dry mouth, dizziness or visual disturbances. 3. Avoid touching the patch. Wash your hands with soap and water after contact with the patch.

## 2020-06-23 NOTE — Anesthesia Postprocedure Evaluation (Signed)
Anesthesia Post Note  Patient: Oscar Sparks  Procedure(s) Performed: left peroneal nerve neurolysis (Left Leg Upper)     Patient location during evaluation: PACU Anesthesia Type: General Level of consciousness: awake and alert Pain management: pain level controlled Vital Signs Assessment: post-procedure vital signs reviewed and stable Respiratory status: spontaneous breathing, nonlabored ventilation, respiratory function stable and patient connected to nasal cannula oxygen Cardiovascular status: blood pressure returned to baseline and stable Postop Assessment: no apparent nausea or vomiting Anesthetic complications: no   No complications documented.  Last Vitals:  Vitals:   06/23/20 1515 06/23/20 1540  BP: 140/79 (!) 129/98  Pulse: (!) 58 64  Resp: 10 18  Temp:  (!) 36.2 C  SpO2: 100% 98%    Last Pain:  Vitals:   06/23/20 1540  TempSrc:   PainSc: 0-No pain                 Zia Kanner COKER

## 2020-06-23 NOTE — Op Note (Signed)
   Date of Surgery: 06/23/2020  INDICATIONS: Mr. Smelser is a 80 y.o.-year-old male with left peroneal nerve neuropathy.  The patient did consent to the procedure after discussion of the risks and benefits.  PREOPERATIVE DIAGNOSIS: Left peroneal nerve compression and neuropathy  POSTOPERATIVE DIAGNOSIS: Same.  PROCEDURE: Neurolysis of left common peroneal nerve, deep peroneal nerve, superficial peroneal nerve  SURGEON: N. [name], M.D.  ASSIST: Ciro Backer [place], [place]; necessary for the timely completion of procedure and due to complexity of procedure.  ANESTHESIA:  general, local  IV FLUIDS AND URINE: See anesthesia.  ESTIMATED BLOOD LOSS: Minimal mL.  IMPLANTS: None  DRAINS: None  COMPLICATIONS: see description of procedure.  DESCRIPTION OF PROCEDURE: The patient was brought to the operating room.  The patient had been signed prior to the procedure and this was documented. The patient had the anesthesia placed by the anesthesiologist.  A time-out was performed to confirm that this was the correct patient, site, side and location. The patient did receive antibiotics prior to the incision and was re-dosed during the procedure as needed at indicated intervals.  A tourniquet was placed.  The patient had the operative extremity prepped and draped in the standard surgical fashion.    A transverse incision was made just distal to the fibular head from the level of the tibial tubercle towards the popliteal fossa.  Blunt dissection was performed using tenotomies through the subcutaneous layer.  No cutaneous nerves were encountered.  I then used my thumb to palpate the common peroneal nerve posterior to the fibular head and its anatomic position.  Careful blunt dissection was performed to release the fascia superficial to the nerve.  The epineurial vessels were visualized and the common peroneal nerve was exposed and traced proximally first.  I used my index finger to feel the entire course  of the common peroneal nerve and compressive areas were released under direct visualization using tenotomy scissors.  I then divided the superficial fascia of the anterior compartment overlying the course of the peroneal nerve.  Once we divided the fascia the muscle bellies were exposed and each of the 3 intermuscular septum's were carefully divided under direct visualization.  Care was taken not to injure any of the branches of the superficial peroneal or common peroneal nerves.  The common peroneal nerve was then gently retracted posteriorly in order to find the posterior crural fascia and septum and this was divided carefully under direct visualization.  The deep peroneal nerve branch was identified and its course was traced deep into the muscle belly and any compression along this nerve was decompressed as well under direct visualization.  Myotomy of the muscle bed was performed in order to create a pocket for the common peroneal nerve to lie in.  The surgical wound was then thoroughly irrigated.  Hemostasis was obtained.  Layered closure was performed.  Patient tolerated the procedure well had no immediate complications.  Tawanna Cooler was necessary for opening, closing, retracting, limb positioning and overall facilitation and timely completion of the procedure.  POSTOPERATIVE PLAN: Patient will be discharged home.  He may be weight-bear as tolerated.  He will use the AFO to help for ambulation.  We will see him back in 2 weeks for suture removal.  N. [name], MD 2:36 PM

## 2020-06-23 NOTE — H&P (Signed)
PREOPERATIVE H&P  Chief Complaint: left foot drop, peroneal nerve compression  HPI: Oscar Sparks is a 80 y.o. male who presents for surgical treatment of left foot drop, peroneal nerve compression.  He denies any changes in medical history.  Past Medical History:  Diagnosis Date  . Arthritis    knees  . Colon cancer ([place]) 09/24/2018  . ED (erectile dysfunction)   . 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 Peripheral vascular disease ([place])    poor curculation in hands and feet hard to monitor with pulse oximetry  . Prostate cancer [facility])    sees Dr. Gaynelle Arabian, received cesium seeds    Past Surgical History:  Procedure Laterality Date  . BACK SURGERY    . SPINE SURGERY  2004   Dr. Sherwood Gambler  . TONSILLECTOMY     Social History   Socioeconomic History  . Marital status: Married    Spouse name: Not on file  . Number of children: Not on file  . Years of education: Not on file  . Highest education level: Not on file  Occupational History  . Not on file  Tobacco Use  . Smoking status: Never Smoker  . Smokeless tobacco: Never Used  Vaping Use  . Vaping Use: Never used  Substance and Sexual Activity  . Alcohol use: Yes    Alcohol/week: 1.0 standard drink    Types: 1 Glasses of wine per week  . Drug use: No  . Sexual activity: Not on file  Other Topics Concern  . Not on file  Social History Narrative  . Not on file   Social Determinants of Health   Financial Resource Strain: Not on file  Food Insecurity: Not on file  Transportation Needs: Not on file  Physical Activity: Not on file  Stress: Not on file  Social Connections: Not on file   Family History  Problem Relation Age of Onset  . Breast cancer Other    Allergies  Allergen Reactions  . Poison Ivy Extract Itching and Rash   Prior to Admission medications   Medication Sig Start Date End Date Taking? Authorizing Provider  Ascorbic Acid (VITAMIN C) 1000 MG tablet Take 1,000 mg by mouth  daily.   Yes [provider]  B Complex-C (SUPER B COMPLEX PO) Take 1 tablet by mouth daily.    Yes [provider]  Cholecalciferol (VITAMIN D) 50 MCG (2000 UT) CAPS Take 2,000 Units by mouth daily.   Yes [provider]  ibuprofen (ADVIL) 200 MG tablet Take 400 mg by mouth 3 (three) times daily as needed for moderate pain.   Yes [provider]  Multiple Vitamins-Minerals (MULTIVITAMIN WITH MINERALS) tablet Take 1 tablet by mouth daily.   Yes [provider]  Omega-3 Fatty Acids (FISH OIL PO) Take 2,000 mg by mouth daily.    Yes [provider]  Turmeric Curcumin 500 MG CAPS Take 500 mg by mouth daily.   Yes [provider]  vitamin E 400 UNIT capsule Take 400 Units by mouth daily.   Yes [provider]     Positive ROS: All other systems have been reviewed and were otherwise negative with the exception of those mentioned in the HPI and as above.  Physical Exam: General: Alert, no acute distress Cardiovascular: No pedal edema Respiratory: No cyanosis, no use of accessory musculature GI: abdomen soft Skin: No lesions in the area of chief complaint Neurologic: Sensation intact distally Psychiatric: Patient is  competent for consent with normal mood and affect Lymphatic: no lymphedema  MUSCULOSKELETAL: exam stable  Assessment: left foot drop, peroneal nerve compression  Plan: Plan for Procedure(s): left peroneal nerve neurolysis  The risks benefits and alternatives were discussed with the patient including but not limited to the risks of nonoperative treatment, versus surgical intervention including infection, bleeding, nerve injury,  blood clots, cardiopulmonary complications, morbidity, mortality, among others, and they were willing to proceed.   Preoperative templating of the joint replacement has been completed, documented, and submitted to the Operating Room personnel in order to optimize intra-operative  equipment management.   [name], MD 06/23/2020 12:09 PM

## 2020-06-23 NOTE — Anesthesia Preprocedure Evaluation (Addendum)
Anesthesia Evaluation  Patient identified by MRN, date of birth, ID band Patient awake    Reviewed: Allergy & Precautions, NPO status , Patient's Chart, lab work & pertinent test results  Airway Mallampati: II  TM Distance: >3 FB Neck ROM: Full    Dental  (+) Teeth Intact, Dental Advisory Given,    Pulmonary    breath sounds clear to auscultation       Cardiovascular  Rhythm:Regular Rate:Normal     Neuro/Psych    GI/Hepatic   Endo/Other    Renal/GU      Musculoskeletal   Abdominal   Peds  Hematology   Anesthesia Other Findings   Reproductive/Obstetrics                            Anesthesia Physical Anesthesia Plan  ASA: II  Anesthesia Plan: General   Post-op Pain Management:    Induction: Intravenous  PONV Risk Score and Plan: Ondansetron and Dexamethasone  Airway Management Planned: LMA  Additional Equipment:   Intra-op Plan:   Post-operative Plan: Extubation in OR  Informed Consent: I have reviewed the patients History and Physical, chart, labs and discussed the procedure including the risks, benefits and alternatives for the proposed anesthesia with the patient or authorized representative who has indicated his/her understanding and acceptance.     Dental advisory given  Plan Discussed with: CRNA and Anesthesiologist  Anesthesia Plan Comments:         Anesthesia Quick Evaluation

## 2020-06-23 NOTE — Transfer of Care (Signed)
Immediate Anesthesia Transfer of Care Note  Patient: Jomel P Herder  Procedure(s) Performed: left peroneal nerve neurolysis (Left Leg Upper)  Patient Location: PACU  Anesthesia Type:General  Level of Consciousness: sedated  Airway & Oxygen Therapy: Patient Spontanous Breathing and Patient connected to face mask oxygen  Post-op Assessment: Report given to RN and Post -op Vital signs reviewed and stable  Post vital signs: Reviewed and stable  Last Vitals:  Vitals Value Taken Time  BP 153/84 06/23/20 1443  Temp 36.4 C 06/23/20 1443  Pulse 56 06/23/20 1445  Resp 8 06/23/20 1445  SpO2 99 % 06/23/20 1445  Vitals shown include unvalidated device data.  Last Pain:  Vitals:   06/23/20 1207  TempSrc: Oral  PainSc: 0-No pain         Complications: No complications documented.

## 2020-06-23 NOTE — Anesthesia Procedure Notes (Signed)
Procedure Name: LMA Insertion Date/Time: 06/23/2020 1:39 PM Performed by: Maryella Shivers, CRNA Pre-anesthesia Checklist: Patient identified, Emergency Drugs available, Suction available and Patient being monitored Patient Re-evaluated:Patient Re-evaluated prior to induction Oxygen Delivery Method: Circle system utilized Preoxygenation: Pre-oxygenation with 100% oxygen Induction Type: IV induction Ventilation: Mask ventilation without difficulty LMA: LMA inserted LMA Size: 5.0 Number of attempts: 1 Airway Equipment and Method: Bite block Placement Confirmation: positive ETCO2 Tube secured with: Tape Dental Injury: Teeth and Oropharynx as per pre-operative assessment

## 2020-06-24 ENCOUNTER — Encounter (HOSPITAL_BASED_OUTPATIENT_CLINIC_OR_DEPARTMENT_OTHER): Payer: Self-pay | Admitting: Orthopaedic Surgery

## 2020-06-24 NOTE — Telephone Encounter (Signed)
FAXED BACK

## 2020-06-30 ENCOUNTER — Encounter: Payer: Self-pay | Admitting: Orthopaedic Surgery

## 2020-06-30 ENCOUNTER — Ambulatory Visit (INDEPENDENT_AMBULATORY_CARE_PROVIDER_SITE_OTHER): Payer: Medicare Other | Admitting: Orthopaedic Surgery

## 2020-06-30 DIAGNOSIS — G5732 Lesion of lateral popliteal nerve, left lower limb: Secondary | ICD-10-CM

## 2020-06-30 DIAGNOSIS — R29898 Other symptoms and signs involving the musculoskeletal system: Secondary | ICD-10-CM

## 2020-06-30 NOTE — Progress Notes (Signed)
   Post-Op Visit Note   Patient: Oscar Sparks           Date of Birth: [birth year]           MRN: [id number] Visit Date: 06/30/2020 PCP: Laurey Morale, MD   Assessment & Plan:  Chief Complaint:  Chief Complaint  Patient presents with  . Left Knee - Routine Post Op, Follow-up   Visit Diagnoses:  1. Peroneal neuropathy at knee, left   2. Left leg weakness     Plan:   Mr. Mauch is 1 week status post left common peroneal, superficial peroneal, deep peroneal nerve decompression.  He is doing well overall.  He already feels an improvement in his pain.  He did not need any pain medications.  He has received his AFO.  Surgical incision is intact without any signs of infection or drainage.  Neurovascular exam is unchanged.  Well fitting AFO.  At this point we will send him to the PT for strengthening, gait training, balance now that his peroneal nerve has been decompressed.  He will continue to use the AFO as needed.  Dry bandage was applied today.  Follow-up next week for suture removal.  Follow-Up Instructions: Return in about 1 week (around 07/07/2020).   Orders:  Orders Placed This Encounter  Procedures  . Ambulatory referral to Physical Therapy   No orders of the defined types were placed in this encounter.   Imaging: No results found.  PMFS History: Patient Active Problem List   Diagnosis Date Noted  . 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 Pain in left hip 06/25/2014  . 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 Prostate cancer ([place]) 11/15/2009  . 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 CONTACT DERMATITIS 09/04/2007   Past Medical History:  Diagnosis Date  . Arthritis    knees  . Colon cancer ([place])  09/24/2018  . ED (erectile dysfunction)   . 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 Peripheral vascular disease ([place])    poor curculation in hands and feet hard to monitor with pulse oximetry  . Prostate cancer [facility])    sees Dr. Gaynelle Arabian, received cesium seeds     Family History  Problem Relation Age of Onset  . Breast cancer Other     Past Surgical History:  Procedure Laterality Date  . BACK SURGERY    . SPINE SURGERY  2004   Dr. Sherwood Gambler  . SUPERFICIAL PERONEAL NERVE RELEASE Left 06/23/2020   Procedure: left peroneal nerve neurolysis;  Surgeon: Leandrew Koyanagi, MD;  Location: [facility];  Service: Orthopedics;  Laterality: Left;  . TONSILLECTOMY     Social History   Occupational History  . Not on file  Tobacco Use  . Smoking status: Never Smoker  . Smokeless tobacco: Never Used  Vaping Use  . Vaping Use: Never used  Substance and Sexual Activity  . Alcohol use: Yes    Alcohol/week: 1.0 standard drink    Types: 1 Glasses of wine per week  . Drug use: No  . Sexual activity: Not on file

## 2020-07-02 ENCOUNTER — Encounter: Payer: Self-pay | Admitting: Rehabilitative and Restorative Service Providers"

## 2020-07-02 ENCOUNTER — Other Ambulatory Visit: Payer: Self-pay

## 2020-07-02 ENCOUNTER — Ambulatory Visit (INDEPENDENT_AMBULATORY_CARE_PROVIDER_SITE_OTHER): Payer: Medicare Other | Admitting: Rehabilitative and Restorative Service Providers"

## 2020-07-02 DIAGNOSIS — R262 Difficulty in walking, not elsewhere classified: Secondary | ICD-10-CM

## 2020-07-02 DIAGNOSIS — M79662 Pain in left lower leg: Secondary | ICD-10-CM | POA: Diagnosis not present

## 2020-07-02 DIAGNOSIS — M6281 Muscle weakness (generalized): Secondary | ICD-10-CM | POA: Diagnosis not present

## 2020-07-02 NOTE — Therapy (Signed)
[place], [place], [address] Phone: [phone]   Fax:  [phone]  Physical Therapy Evaluation  Patient Details  Name: Oscar Sparks MRN: [id number] Date of Birth: 11-22-1940 Referring Provider (PT): Dr. Erlinda Hong   Encounter Date: 07/02/2020   PT End of Session - 07/02/20 0846    Visit Number 1    Number of Visits 20    Date for PT Re-Evaluation 09/10/20    Authorization Type Medicare, KX at 15    Progress Note Due on Visit 10    PT Start Time 0850    PT Stop Time 0924    PT Time Calculation (min) 34 min    Activity Tolerance Patient tolerated treatment well    Behavior During Therapy [facility] for tasks assessed/performed           Past Medical History:  Diagnosis Date  . Arthritis    knees  . Colon cancer ([place]) 09/24/2018  . ED (erectile dysfunction)   . 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 Peripheral vascular disease ([place])    poor curculation in hands and feet hard to monitor with pulse oximetry  . Prostate cancer [facility])    sees Dr. Gaynelle Arabian, received cesium seeds     Past Surgical History:  Procedure Laterality Date  . BACK SURGERY    . SPINE SURGERY  2004   Dr. Sherwood Gambler  . SUPERFICIAL PERONEAL NERVE RELEASE Left 06/23/2020   Procedure: left peroneal nerve neurolysis;  Surgeon: Leandrew Koyanagi, MD;  Location: [place];  Service: Orthopedics;  Laterality: Left;  . TONSILLECTOMY      There were no vitals filed for this visit.    Subjective Assessment - 07/02/20 0845    Subjective S/P Lt peroneal nerve decompression, foot drop.  Surgery on 06/23/2020.  Pt. stated feeling good since surgery.  Pt. stated he has a stationary bike at home and used since surgery.    Limitations Walking    Patient Stated Goals Walk without the cane    Currently in Pain? No/denies    Pain Score 4    at worst   Pain Location Leg    Pain Orientation Left   lateral knee   Pain Descriptors /  Indicators Sore    Pain Type Surgical pain    Pain Onset 1 to 4 weeks ago    Pain Frequency Occasional    Aggravating Factors  walking sometimes    Pain Relieving Factors OTC medicine at times              [facility] PT Assessment - 07/02/20 0001      Assessment   Medical Diagnosis Lt peroneal nerve decompression, foot drop    Referring Provider (PT) Dr. Erlinda Hong    Onset Date/Surgical Date 06/23/20    Hand Dominance Right      Precautions   Precautions None      Restrictions   Weight Bearing Restrictions No      Balance Screen   Has the patient fallen in the past 6 months Yes    How many times? 4    Has the patient had a decrease in activity level because of a fear of falling?  No    Is the patient reluctant to leave their home because of a fear of falling?  No      Prior Function   Level of Independence Independent    Vocation Retired    Leisure Walking,  exercise bike      Observation/Other Assessments   Focus on Therapeutic Outcomes (FOTO)  intake 47%, expected 55%      Functional Tests   Functional tests Sit to Stand;Single leg stance      Single Leg Stance   Comments Poor control < 3 seconds bilateral      Sit to Stand   Comments able to perform from 18 inch chair s UE but bracing of legs on chair      ROM / Strength   AROM / PROM / Strength Strength;PROM;AROM      AROM   Overall AROM Comments Inversion, eversion, PF Lt equal to Rt [facility]    AROM Assessment Site Ankle    Right/Left Ankle Left;Right    Left Ankle Dorsiflexion 5   seated knee flexion 90 deg     PROM   PROM Assessment Site Ankle    Right/Left Ankle Left;Right    Left Ankle Dorsiflexion 5   seated knee flexion 90 deg     Strength   Strength Assessment Site Ankle;Knee;Hip    Right/Left Hip Left;Right    Right Hip Extension 4/5    Right Hip ABduction 3+/5    Left Hip Extension 3+/5    Left Hip ABduction 3/5    Right/Left Knee Left;Right    Right Knee Flexion 5/5    Right Knee Extension 5/5     Left Knee Flexion 5/5    Left Knee Extension 5/5    Right/Left Ankle Left;Right    Right Ankle Dorsiflexion 4+/5   37, 38.6 lbs dynamometry in sitting MMT   Right Ankle Plantar Flexion 3/5   1 rep SL heel raise   Right Ankle Inversion 5/5    Right Ankle Eversion 5/5    Left Ankle Dorsiflexion 4+/5   34, 36.8 lbs  dynamometry in sitting MMT   Left Ankle Plantar Flexion 2+/5   unable to perform SL heel raise in standing   Left Ankle Inversion 5/5    Left Ankle Eversion 5/5      Ambulation/Gait   Gait Comments SPC use in Rt UE, reduced stance on Lt LE, decreased DF noted in swing  AFO on Lt LE      Balance   Balance Assessed Yes      Standardized Balance Assessment   Standardized Balance Assessment Berg Balance Test      Berg Balance Test   Sit to Stand Able to stand without using hands and stabilize independently    Standing Unsupported Able to stand safely 2 minutes    Sitting with Back Unsupported but Feet Supported on Floor or Stool Able to sit safely and securely 2 minutes    Stand to Sit Uses backs of legs against chair to control descent    Transfers Able to transfer safely, minor use of hands    Standing Unsupported with Eyes Closed Able to stand 10 seconds safely    Standing Unsupported with Feet Together Able to place feet together independently and stand for 1 minute with supervision    From Standing, Reach Forward with Outstretched Arm Can reach forward >12 cm safely (5")    From Standing Position, Pick up Object from Floor Able to pick up shoe safely and easily    From Standing Position, Turn to Look Behind Over each Shoulder Looks behind from both sides and weight shifts well    Turn 360 Degrees Able to turn 360 degrees safely but slowly    Standing Unsupported,  Alternately Place Feet on Step/Stool Needs assistance to keep from falling or unable to try    Standing Unsupported, One Foot in Front Able to take small step independently and hold 30 seconds    Standing on One  Leg Unable to try or needs assist to prevent fall    Total Score 40                      Objective measurements completed on examination: See above findings.       [facility] Adult PT Treatment/Exercise - 07/02/20 0001      Exercises   Exercises Other Exercises;Ankle;Knee/Hip    Other Exercises  HEP instruction/performance c cues for techniques, handout provided.  Trial set performed of each for comprehension and symptom assessment.  Consisted of seated SLR c DF, supine bridge, sidelying clam shell, standing toe raise/heel raise                  PT Education - 07/02/20 0845    Education Details HEP, POC    Person(s) Educated Patient    Methods Explanation;Demonstration;Verbal cues;Handout    Comprehension Verbalized understanding;Returned demonstration            PT Short Term Goals - 07/02/20 0846      PT SHORT TERM GOAL #1   Title Patient will demonstrate independent use of home exercise program to maintain progress from in clinic treatments.    Time 3    Period Weeks    Status New    Target Date 07/23/20             PT Long Term Goals - 07/02/20 0846      PT LONG TERM GOAL #1   Title Patient will demonstrate/report pain at worst less than or equal to 2/10 to facilitate minimal limitation in daily activity secondary to pain symptoms.    Time 10    Period Weeks    Status New    Target Date 09/10/20      PT LONG TERM GOAL #2   Title Patient will demonstrate independent use of home exercise program to facilitate ability to maintain/progress functional gains from skilled physical therapy services.    Time 10    Period Weeks    Status New    Target Date 09/10/20      PT LONG TERM GOAL #3   Title Pt. will demonstrate FOTO outcome > 55 to indicated reduced disability due to condition.    Time 10    Period Weeks    Status New    Target Date 09/10/20      PT LONG TERM GOAL #4   Title Pt. will demonstrate bilateral hip MMT > or = 4/5, ankle DF  5/5 bilateral to facilitate stability in ambulation, transfers independently.    Time 10    Period Weeks    Status New    Target Date 09/10/20      PT LONG TERM GOAL #5   Title Pt. will demonstrate BERG > 45 to indicate reduced fall risk.    Time 10    Period Weeks    Status New    Target Date 09/10/20      Additional Long Term Goals   Additional Long Term Goals Yes      PT LONG TERM GOAL #6   Title Pt. will demonstrate independent ambulation community distances.    Time 10    Period Weeks    Status New  Target Date 09/10/20                  Plan - 07/02/20 0847    Clinical Impression Statement Patient is a 80 y.o. who comes to clinic with complaints of Lt lower leg pain with mobility, strength and movement coordination deficits that impair their ability to perform usual daily and recreational functional activities without increase difficulty/symptoms at this time.  Patient to benefit from skilled PT services to address impairments and limitations to improve to previous level of function without restriction secondary to condition.    Personal Factors and Comorbidities Comorbidity 3+    Comorbidities 2020 Colon Cancer, Neuropathy in feet, PVD, prostate cancer.  Previously seen by clinic for Lt hip pain    Examination-Activity Limitations Squat;Bend;Stairs;Stand;Locomotion Level;Transfers    Examination-Participation Restrictions Community Activity;Shop;Yard Work    Merchant [employer] officer Evolving/Moderate complexity    Clinical Decision Making Moderate    Rehab Potential --   fair to good   PT Frequency 2x / week    PT Duration Other (comment)   10 wks   PT Treatment/Interventions ADLs/Self Care Home Management;Electrical Stimulation;Iontophoresis 4mg /ml Dexamethasone;Moist Heat;Balance training;Therapeutic exercise;Therapeutic activities;Functional mobility training;Stair training;Gait training;DME Instruction;Ultrasound;Neuromuscular  re-education;Patient/family education;Passive range of motion;Dry needling;Spinal Manipulations;Joint Manipulations;Taping;Manual techniques    PT Next Visit Plan Review HEP knowledge.  Glute med/max strengthening, quad strengthening.  Static balance interventions    PT Home Exercise Plan [email or web address]    Consulted and Agree with Plan of Care Patient           Patient will benefit from skilled therapeutic intervention in order to improve the following deficits and impairments:  Abnormal gait,Decreased endurance,Decreased activity tolerance,Pain,Decreased strength,Difficulty walking,Decreased mobility,Decreased balance,Decreased range of motion,Decreased coordination,Impaired perceived functional ability,Improper body mechanics  Visit Diagnosis: Pain in left lower leg  Muscle weakness (generalized)  Difficulty in walking, not elsewhere classified     Problem List Patient Active Problem List   Diagnosis Date Noted  . 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 Pain in left hip 06/25/2014  . 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 Prostate cancer ([place]) 11/15/2009  . 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 CONTACT DERMATITIS 09/04/2007   Scot Jun, PT, DPT, OCS, ATC 07/02/20  10:31 AM    [facility] [address] [place], [place], [address] Phone: [phone]   Fax:  [phone]  Name: CLARON ROSENCRANS MRN: [id number] Date of Birth: 1940/08/22

## 2020-07-02 NOTE — Patient Instructions (Signed)
Access Code: [email or web address] URL: [email or web address] Date: 07/02/2020 Prepared by: Scot Jun  Exercises Clamshell - 1-2 x daily - 7 x weekly - 3 sets - 10 reps Supine Bridge - 1-2 x daily - 7 x weekly - 3 sets - 10 reps - 2 hold Seated Straight Leg Heel Taps - 1-2 x daily - 7 x weekly - 3 sets - 10 reps Standing Toe Raises at Chair - 1-2 x daily - 7 x weekly - 3 sets - 10 reps Standing Heel Raise with Support - 1-2 x daily - 7 x weekly - 3 sets - 10 reps

## 2020-07-05 ENCOUNTER — Ambulatory Visit (INDEPENDENT_AMBULATORY_CARE_PROVIDER_SITE_OTHER): Payer: Medicare Other | Admitting: Physical Therapy

## 2020-07-05 ENCOUNTER — Other Ambulatory Visit: Payer: Self-pay

## 2020-07-05 DIAGNOSIS — R262 Difficulty in walking, not elsewhere classified: Secondary | ICD-10-CM

## 2020-07-05 DIAGNOSIS — M25552 Pain in left hip: Secondary | ICD-10-CM | POA: Diagnosis not present

## 2020-07-05 DIAGNOSIS — M79662 Pain in left lower leg: Secondary | ICD-10-CM

## 2020-07-05 DIAGNOSIS — M6281 Muscle weakness (generalized): Secondary | ICD-10-CM

## 2020-07-05 NOTE — Therapy (Signed)
[place], [place], [address] Phone: [phone]   Fax:  [phone]  Physical Therapy Treatment  Patient Details  Name: Oscar Sparks MRN: [id number] Date of Birth: 04/19/1940 Referring Provider (PT): Dr. Erlinda Hong   Encounter Date: 07/05/2020   PT End of Session - 07/05/20 1540    Visit Number 2    Number of Visits 20    Date for PT Re-Evaluation 09/10/20    Authorization Type Medicare, KX at 15    Progress Note Due on Visit 10    PT Start Time 1430    PT Stop Time 1515    PT Time Calculation (min) 45 min    Activity Tolerance Patient tolerated treatment well    Behavior During Therapy [facility] for tasks assessed/performed           Past Medical History:  Diagnosis Date  . Arthritis    knees  . Colon cancer ([place]) 09/24/2018  . ED (erectile dysfunction)   . 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 Peripheral vascular disease ([place])    poor curculation in hands and feet hard to monitor with pulse oximetry  . Prostate cancer [facility])    sees Dr. Gaynelle Arabian, received cesium seeds     Past Surgical History:  Procedure Laterality Date  . BACK SURGERY    . SPINE SURGERY  2004   Dr. Sherwood Gambler  . SUPERFICIAL PERONEAL NERVE RELEASE Left 06/23/2020   Procedure: left peroneal nerve neurolysis;  Surgeon: Leandrew Koyanagi, MD;  Location: [place];  Service: Orthopedics;  Laterality: Left;  . TONSILLECTOMY      There were no vitals filed for this visit.   Subjective Assessment - 07/05/20 1436    Subjective He relays about 4/10 pain in his left foot overall, with some numbness and throbbing.    Limitations Walking    Patient Stated Goals Walk without the cane    Pain Onset 1 to 4 weeks ago              [facility] Adult PT Treatment/Exercise - 07/05/20 0001      Neuro Re-ed    Neuro Re-ed Details  walking on foam beam fwd and lateral X5 round trips ea      Knee/Hip Exercises: Aerobic   Nustep L6  X 10 min      Knee/Hip Exercises: Standing   Heel Raises Limitations heel and toe raises 2X10    Lateral Step Up Both;10 reps;Hand Hold: 2;Step Height: 6"    Lateral Step Up Limitations step up and off to other side      Knee/Hip Exercises: Seated   Other Seated Knee/Hip Exercises circular rocker board 20 reps A-P, latreral, and circles      Knee/Hip Exercises: Supine   Other Supine Knee/Hip Exercises Lt ankle 4way with red band 2X10 but much difficulty with EV, Lt ankle alphabet X1      Knee/Hip Exercises: Sidelying   Other Sidelying Knee/Hip Exercises on his Rt side moving into Lt EV 2X10                    PT Short Term Goals - 07/02/20 0846      PT SHORT TERM GOAL #1   Title Patient will demonstrate independent use of home exercise program to maintain progress from in clinic treatments.    Time 3    Period Weeks    Status New    Target Date  07/23/20             PT Long Term Goals - 07/02/20 0846      PT LONG TERM GOAL #1   Title Patient will demonstrate/report pain at worst less than or equal to 2/10 to facilitate minimal limitation in daily activity secondary to pain symptoms.    Time 10    Period Weeks    Status New    Target Date 09/10/20      PT LONG TERM GOAL #2   Title Patient will demonstrate independent use of home exercise program to facilitate ability to maintain/progress functional gains from skilled physical therapy services.    Time 10    Period Weeks    Status New    Target Date 09/10/20      PT LONG TERM GOAL #3   Title Pt. will demonstrate FOTO outcome > 55 to indicated reduced disability due to condition.    Time 10    Period Weeks    Status New    Target Date 09/10/20      PT LONG TERM GOAL #4   Title Pt. will demonstrate bilateral hip MMT > or = 4/5, ankle DF 5/5 bilateral to facilitate stability in ambulation, transfers independently.    Time 10    Period Weeks    Status New    Target Date 09/10/20      PT LONG TERM GOAL  #5   Title Pt. will demonstrate BERG > 45 to indicate reduced fall risk.    Time 10    Period Weeks    Status New    Target Date 09/10/20      Additional Long Term Goals   Additional Long Term Goals Yes      PT LONG TERM GOAL #6   Title Pt. will demonstrate independent ambulation community distances.    Time 10    Period Weeks    Status New    Target Date 09/10/20                 Plan - 07/05/20 1542    Clinical Impression Statement session focused on Lt ankle strength and stability with overall balance and leg strength today with good tolerance. He is paticularly week with Lt ankle EV which is expected after peroneal nerve release. We will progress this as able.    Personal Factors and Comorbidities Comorbidity 3+    Comorbidities 2020 Colon Cancer, Neuropathy in feet, PVD, prostate cancer.  Previously seen by clinic for Lt hip pain    Examination-Activity Limitations Squat;Bend;Stairs;Stand;Locomotion Level;Transfers    Examination-Participation Restrictions Community Activity;Shop;Yard Work    Product manager    Rehab Potential --   fair to good   PT Frequency 2x / week    PT Duration Other (comment)   10 wks   PT Treatment/Interventions ADLs/Self Care Home Management;Electrical Stimulation;Iontophoresis 4mg /ml Dexamethasone;Moist Heat;Balance training;Therapeutic exercise;Therapeutic activities;Functional mobility training;Stair training;Gait training;DME Instruction;Ultrasound;Neuromuscular re-education;Patient/family education;Passive range of motion;Dry needling;Spinal Manipulations;Joint Manipulations;Taping;Manual techniques    PT Next Visit Plan Review HEP knowledge.  Glute med/max strengthening, quad strengthening.  Static balance interventions    PT Home Exercise Plan [email or web address]    Consulted and Agree with Plan of Care Patient           Patient will benefit from skilled therapeutic intervention in order to improve  the following deficits and impairments:  Abnormal gait,Decreased endurance,Decreased activity tolerance,Pain,Decreased strength,Difficulty walking,Decreased mobility,Decreased balance,Decreased range of motion,Decreased coordination,Impaired perceived functional ability,Improper body mechanics  Visit Diagnosis: Pain in left lower leg  Muscle weakness (generalized)  Difficulty in walking, not elsewhere classified  Pain in left hip     Problem List Patient Active Problem List   Diagnosis Date Noted  . 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 Pain in left hip 06/25/2014  . 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 Prostate cancer ([place]) 11/15/2009  . 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 CONTACT DERMATITIS 09/04/2007    Silvestre Mesi 07/05/2020, 3:44 PM  [facility] [address] [place], [place], [address] Phone: [phone]   Fax:  [phone]  Name: Oscar Sparks MRN: [id number] Date of Birth: 09-30-1940

## 2020-07-07 ENCOUNTER — Ambulatory Visit (INDEPENDENT_AMBULATORY_CARE_PROVIDER_SITE_OTHER): Payer: Medicare Other | Admitting: Physician Assistant

## 2020-07-07 ENCOUNTER — Encounter: Payer: Self-pay | Admitting: Orthopaedic Surgery

## 2020-07-07 DIAGNOSIS — G5702 Lesion of sciatic nerve, left lower limb: Secondary | ICD-10-CM

## 2020-07-07 NOTE — Progress Notes (Signed)
   Post-Op Visit Note   Patient: Oscar Sparks           Date of Birth: [birth year]           MRN: [id number] Visit Date: 07/07/2020 PCP: Laurey Morale, MD   Assessment & Plan:  Chief Complaint:  Chief Complaint  Patient presents with  . Left Knee - Pain   Visit Diagnoses:  1. 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  He has been doing well.  He has been in physical therapy and ambulating in an AFO and utilizing a cane.  He has no pain.  Examination of his left knee reveals a well healed surgical incision with nylon sutures in place.  No evidence of infection or cellulitis.  AFOs in place.  Today, sutures were removed and Steri-Strips applied.  He will continue to ambulate and AFO brace.  He will continue with physical therapy.  He will follow up with [place] in 4 weeks time for recheck.  Call with concerns or questions in the meantime. Follow-Up Instructions: Return in about 4 weeks (around 08/04/2020).   Orders:  No orders of the defined types were placed in this encounter.  No orders of the defined types were placed in this encounter.   Imaging: No new imaging   PMFS History: Patient Active Problem List   Diagnosis Date Noted  . 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 Pain in left hip 06/25/2014  . 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 Prostate cancer ([place]) 11/15/2009  . 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 CONTACT DERMATITIS 09/04/2007   Past Medical History:  Diagnosis Date  . Arthritis    knees  . Colon cancer ([place]) 09/24/2018  . ED (erectile dysfunction)   .  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 Peripheral vascular disease ([place])    poor curculation in hands and feet hard to monitor with pulse oximetry  . Prostate cancer [facility])    sees Dr. Gaynelle Arabian, received cesium seeds     Family History  Problem Relation Age of Onset  . Breast cancer Other     Past Surgical History:  Procedure Laterality Date  . BACK SURGERY    . SPINE SURGERY  2004   Dr. Sherwood Gambler  . SUPERFICIAL PERONEAL NERVE RELEASE Left 06/23/2020   Procedure: left peroneal nerve neurolysis;  Surgeon: Leandrew Koyanagi, MD;  Location: [place];  Service: Orthopedics;  Laterality: Left;  . TONSILLECTOMY     Social History   Occupational History  . Not on file  Tobacco Use  . Smoking status: Never Smoker  . Smokeless tobacco: Never Used  Vaping Use  . Vaping Use: Never used  Substance and Sexual Activity  . Alcohol use: Yes    Alcohol/week: 1.0 standard drink    Types: 1 Glasses of wine per week  . Drug use: No  . Sexual activity: Not on file

## 2020-07-12 ENCOUNTER — Ambulatory Visit (INDEPENDENT_AMBULATORY_CARE_PROVIDER_SITE_OTHER): Payer: Medicare Other | Admitting: Physical Therapy

## 2020-07-12 ENCOUNTER — Other Ambulatory Visit: Payer: Self-pay

## 2020-07-12 DIAGNOSIS — M25552 Pain in left hip: Secondary | ICD-10-CM

## 2020-07-12 DIAGNOSIS — M79662 Pain in left lower leg: Secondary | ICD-10-CM | POA: Diagnosis not present

## 2020-07-12 DIAGNOSIS — M6281 Muscle weakness (generalized): Secondary | ICD-10-CM

## 2020-07-12 DIAGNOSIS — R262 Difficulty in walking, not elsewhere classified: Secondary | ICD-10-CM

## 2020-07-12 NOTE — Therapy (Signed)
[place], [place], [address] Phone: [phone]   Fax:  [phone]  Physical Therapy Treatment  Patient Details  Name: Oscar Sparks MRN: [id number] Date of Birth: [birth year] Referring Provider (PT): Dr. Erlinda Hong   Encounter Date: 07/12/2020   PT End of Session - 07/12/20 1014    Visit Number 3    Number of Visits 20    Date for PT Re-Evaluation 09/10/20    Authorization Type Medicare, KX at 15    Progress Note Due on Visit 10    PT Start Time 0930    PT Stop Time 1010    PT Time Calculation (min) 40 min           Past Medical History:  Diagnosis Date  . Arthritis    knees  . Colon cancer ([place]) 09/24/2018  . ED (erectile dysfunction)   . 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 Peripheral vascular disease ([place])    poor curculation in hands and feet hard to monitor with pulse oximetry  . Prostate cancer [facility])    sees Dr. Gaynelle Arabian, received cesium seeds     Past Surgical History:  Procedure Laterality Date  . BACK SURGERY    . SPINE SURGERY  2004   Dr. Sherwood Gambler  . SUPERFICIAL PERONEAL NERVE RELEASE Left 06/23/2020   Procedure: left peroneal nerve neurolysis;  Surgeon: Leandrew Koyanagi, MD;  Location: [facility];  Service: Orthopedics;  Laterality: Left;  . TONSILLECTOMY      There were no vitals filed for this visit.   Subjective Assessment - 07/12/20 1140    Subjective Pt arriving today amb with straight cane reporting no pain in his left foot.    Patient Stated Goals Walk without the cane    Currently in Pain? No/denies                             [facility] Adult PT Treatment/Exercise - 07/12/20 0001      Knee/Hip Exercises: Aerobic   Recumbent Bike L2, 10 minutes      Knee/Hip Exercises: Machines for Strengthening   Total Gym Leg Press 82# bilateral LE's 3x10      Knee/Hip Exercises: Standing   Heel Raises Limitations heel and toe raises 2X10     Lateral Step Up Both;10 reps;Hand Hold: 1;Step Height: 4"    Forward Step Up 10 reps;Hand Hold: 1;Step Height: 4"    Other Standing Knee Exercises heel taps on 4 inch step with intermittent UE support    Other Standing Knee Exercises rocker board forward/back and side ot side each x 1 minute      Knee/Hip Exercises: Seated   Sit to Sand 15 reps;with UE support      Knee/Hip Exercises: Supine   Other Supine Knee/Hip Exercises Lt ankle 4way with red band 2X10 but much difficulty with EV, Lt ankle alphabet X1                    PT Short Term Goals - 07/12/20 1016      PT SHORT TERM GOAL #1   Title Patient will demonstrate independent use of home exercise program to maintain progress from in clinic treatments.    Status On-going             PT Long Term Goals - 07/12/20 1137      PT LONG TERM GOAL #1  Title Patient will demonstrate/report pain at worst less than or equal to 2/10 to facilitate minimal limitation in daily activity secondary to pain symptoms.    Status On-going      PT LONG TERM GOAL #2   Title Patient will demonstrate independent use of home exercise program to facilitate ability to maintain/progress functional gains from skilled physical therapy services.    Status On-going      PT LONG TERM GOAL #3   Title Pt. will demonstrate FOTO outcome > 55 to indicated reduced disability due to condition.    Status On-going      PT LONG TERM GOAL #4   Title Pt. will demonstrate bilateral hip MMT > or = 4/5, ankle DF 5/5 bilateral to facilitate stability in ambulation, transfers independently.    Status On-going      PT LONG TERM GOAL #5   Title Pt. will demonstrate BERG > 45 to indicate reduced fall risk.    Status On-going      PT LONG TERM GOAL #6   Title Pt. will demonstrate independent ambulation community distances.    Status On-going                 Plan - 07/12/20 1015    Clinical Impression Statement Pt tolerating exercises well with no  pain reported. Working on [employer]. Pt still with weakness noted in eversion along with glutes. Continue skilled PT.    Personal Factors and Comorbidities Comorbidity 3+    Comorbidities 2020 Colon Cancer, Neuropathy in feet, PVD, prostate cancer.  Previously seen by clinic for Lt hip pain    Examination-Activity Limitations Squat;Bend;Stairs;Stand;Locomotion Level;Transfers    Examination-Participation Restrictions Community Activity;Shop;Yard Work    [employer]chant [employer] officer Evolving/Moderate complexity    PT Frequency 2x / week    PT Duration Other (comment)    PT Treatment/Interventions ADLs/Self Care Home Management;Electrical Stimulation;Iontophoresis 4mg /ml Dexamethasone;Moist Heat;Balance training;Therapeutic exercise;Therapeutic activities;Functional mobility training;Stair training;Gait training;DME Instruction;Ultrasound;Neuromuscular re-education;Patient/family education;Passive range of motion;Dry needling;Spinal Manipulations;Joint Manipulations;Taping;Manual techniques    PT Next Visit Plan Review HEP knowledge.  Glute med/max strengthening, quad strengthening.  Static balance interventions    PT Home Exercise Plan [id number]    Consulted and Agree with Plan of Care Patient           Patient will benefit from skilled therapeutic intervention in order to improve the following deficits and impairments:  Abnormal gait,Decreased endurance,Decreased activity tolerance,Pain,Decreased strength,Difficulty walking,Decreased mobility,Decreased balance,Decreased range of motion,Decreased coordination,Impaired perceived functional ability,Improper body mechanics  Visit Diagnosis: Pain in left lower leg  Muscle weakness (generalized)  Difficulty in walking, not elsewhere classified  Pain in left hip     Problem List Patient Active Problem List   Diagnosis Date Noted  . 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 Kyphosis of  thoracic region 09/30/2018  . 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 Pain in left hip 06/25/2014  . 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 Prostate cancer ([place]) 11/15/2009  . 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 CONTACT DERMATITIS 09/04/2007    Oretha Caprice, PT, MPT 07/12/2020, 11:42 AM  [facility] Physical Therapy [address] [place], [place], [address] Phone: [phone]   Fax:  [phone]  Name: Oscar Sparks MRN: [id number] Date of Birth: [birth year]

## 2020-07-15 ENCOUNTER — Encounter: Payer: Medicare Other | Admitting: Rehabilitative and Restorative Service Providers"

## 2020-07-15 ENCOUNTER — Telehealth: Payer: Self-pay | Admitting: Rehabilitative and Restorative Service Providers"

## 2020-07-15 NOTE — Telephone Encounter (Signed)
Called and left message about today's missed visit and reminder of next appointment time.   Scot Jun, PT, DPT, OCS, ATC 07/15/20  2:07 PM

## 2020-07-16 ENCOUNTER — Ambulatory Visit: Payer: Medicare Other | Admitting: Rehabilitative and Restorative Service Providers"

## 2020-07-16 ENCOUNTER — Other Ambulatory Visit: Payer: Self-pay

## 2020-07-16 ENCOUNTER — Encounter: Payer: Self-pay | Admitting: Rehabilitative and Restorative Service Providers"

## 2020-07-16 DIAGNOSIS — M6281 Muscle weakness (generalized): Secondary | ICD-10-CM

## 2020-07-16 DIAGNOSIS — M79662 Pain in left lower leg: Secondary | ICD-10-CM | POA: Diagnosis not present

## 2020-07-16 DIAGNOSIS — R262 Difficulty in walking, not elsewhere classified: Secondary | ICD-10-CM

## 2020-07-16 DIAGNOSIS — M25552 Pain in left hip: Secondary | ICD-10-CM | POA: Diagnosis not present

## 2020-07-16 NOTE — Therapy (Signed)
[place], [place], [address] Phone: [phone]   Fax:  [phone]  Physical Therapy Treatment  Patient Details  Name: Oscar Sparks MRN: [id number] Date of Birth: [birth year] Referring Provider (PT): Dr. Erlinda Hong   Encounter Date: 07/16/2020   PT End of Session - 07/16/20 1517    Visit Number 4    Number of Visits 20    Date for PT Re-Evaluation 09/10/20    Authorization Type Medicare, KX at 15    Progress Note Due on Visit 10    PT Start Time [id number]    PT Stop Time 1512    PT Time Calculation (min) 47 min    Equipment Utilized During Treatment Gait belt    Activity Tolerance Patient tolerated treatment well;No increased pain;Patient limited by fatigue    Behavior During Therapy [facility] for tasks assessed/performed           Past Medical History:  Diagnosis Date  . Arthritis    knees  . Colon cancer ([place]) 09/24/2018  . ED (erectile dysfunction)   . 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 Peripheral vascular disease ([place])    poor curculation in hands and feet hard to monitor with pulse oximetry  . Prostate cancer [facility])    sees Dr. Gaynelle Arabian, received cesium seeds     Past Surgical History:  Procedure Laterality Date  . BACK SURGERY    . SPINE SURGERY  2004   Dr. Sherwood Gambler  . SUPERFICIAL PERONEAL NERVE RELEASE Left 06/23/2020   Procedure: left peroneal nerve neurolysis;  Surgeon: Leandrew Koyanagi, MD;  Location: [facility];  Service: Orthopedics;  Laterality: Left;  . TONSILLECTOMY      There were no vitals filed for this visit.   Subjective Assessment - 07/16/20 1515    Subjective Oscar Sparks reports excellent compliance with his HEP.    Patient Stated Goals Walk without the cane    Currently in Pain? No/denies    Aggravating Factors  Poor endurance and balance impairments    Multiple Pain Sites No                             [facility] Adult PT Treatment/Exercise -  07/16/20 0001      Therapeutic Activites    Therapeutic Activities ADL's    ADL's Tap-ups (no hands); step-up and over (no hands)      Neuro Re-ed    Neuro Re-ed Details  Feet together eyes closed; wide tandem balance 3X 30 seconds each      Knee/Hip Exercises: Machines for Strengthening   Total Gym Leg Press 81# B legs full extension with slow eccentrics 2 sets of 10      Knee/Hip Exercises: Standing   Heel Raises Limitations Heel to toe raises 2 sets of 10 for 3 seconds      Knee/Hip Exercises: Seated   Other Seated Knee/Hip Exercises Ankle theraband In/Ev/Df 2 sets of 10 Red slow eccentrics    Sit to Sand 2 sets;5 reps;without UE support;Other (comment)   slow eccentrics                 PT Education - 07/16/20 1515    Education Details Reviewed HEP with emphasis on heel to toe raises, sit to stand and ankle theraband.  Progressed some activities in the clinic (step-up and over no hands).    Person(s) Educated Patient  Methods Explanation;Demonstration;Tactile cues;Verbal cues    Comprehension Verbal cues required;Need further instruction;Returned demonstration;Verbalized understanding;Tactile cues required            PT Short Term Goals - 07/16/20 1516      PT SHORT TERM GOAL #1   Title Patient will demonstrate independent use of home exercise program to maintain progress from in clinic treatments.    Status Achieved             PT Long Term Goals - 07/16/20 1516      PT LONG TERM GOAL #1   Title Patient will demonstrate/report pain at worst less than or equal to 2/10 to facilitate minimal limitation in daily activity secondary to pain symptoms.    Status On-going      PT LONG TERM GOAL #2   Title Patient will demonstrate independent use of home exercise program to facilitate ability to maintain/progress functional gains from skilled physical therapy services.    Status On-going      PT LONG TERM GOAL #3   Title Pt. will demonstrate FOTO outcome > 55  to indicated reduced disability due to condition.    Status On-going      PT LONG TERM GOAL #4   Title Pt. will demonstrate bilateral hip MMT > or = 4/5, ankle DF 5/5 bilateral to facilitate stability in ambulation, transfers independently.    Status On-going      PT LONG TERM GOAL #5   Title Pt. will demonstrate BERG > 45 to indicate reduced fall risk.    Status On-going      PT LONG TERM GOAL #6   Title Pt. will demonstrate independent ambulation community distances.    Status On-going                 Plan - 07/16/20 1517    Clinical Impression Statement Oscar Sparks is giving excellent effort with both his home and clinic programs.  Ankle strength, general strength and balance remain high priorities.  Continue current plan to meet LTGs.    Personal Factors and Comorbidities Comorbidity 3+    Comorbidities 2020 Colon Cancer, Neuropathy in feet, PVD, prostate cancer.  Previously seen by clinic for Lt hip pain    Examination-Activity Limitations Squat;Bend;Stairs;Stand;Locomotion Level;Transfers    Examination-Participation Restrictions Community Activity;Shop;Yard Work    Merchant [employer] officer Evolving/Moderate complexity    PT Frequency 2x / week    PT Duration Other (comment)    PT Treatment/Interventions ADLs/Self Care Home Management;Electrical Stimulation;Iontophoresis 4mg /ml Dexamethasone;Moist Heat;Balance training;Therapeutic exercise;Therapeutic activities;Functional mobility training;Stair training;Gait training;DME Instruction;Ultrasound;Neuromuscular re-education;Patient/family education;Passive range of motion;Dry needling;Spinal Manipulations;Joint Manipulations;Taping;Manual techniques    PT Next Visit Plan Review HEP knowledge.  Glute med/max strengthening, quad strengthening.  Static balance interventions    PT Home Exercise Plan [id number]    Consulted and Agree with Plan of Care Patient           Patient will benefit from skilled therapeutic  intervention in order to improve the following deficits and impairments:  Abnormal gait,Decreased endurance,Decreased activity tolerance,Pain,Decreased strength,Difficulty walking,Decreased mobility,Decreased balance,Decreased range of motion,Decreased coordination,Impaired perceived functional ability,Improper body mechanics  Visit Diagnosis: Difficulty in walking, not elsewhere classified  Muscle weakness (generalized)  Pain in left hip  Pain in left lower leg     Problem List Patient Active Problem List   Diagnosis Date Noted  . 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 Pain in left hip 06/25/2014  . 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 Prostate cancer ([place]) 11/15/2009  . 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 CONTACT DERMATITIS 09/04/2007    Farley Ly PT, MPT 07/16/2020, 3:20 PM  [facility] Physical Therapy [address] [place], [place], [address] Phone: [phone]   Fax:  [phone]  Name: Oscar Sparks MRN: [id number] Date of Birth: May 20, 1940

## 2020-07-20 ENCOUNTER — Ambulatory Visit (INDEPENDENT_AMBULATORY_CARE_PROVIDER_SITE_OTHER): Payer: Medicare Other | Admitting: Rehabilitative and Restorative Service Providers"

## 2020-07-20 ENCOUNTER — Encounter: Payer: Self-pay | Admitting: Rehabilitative and Restorative Service Providers"

## 2020-07-20 ENCOUNTER — Other Ambulatory Visit: Payer: Self-pay

## 2020-07-20 DIAGNOSIS — M6281 Muscle weakness (generalized): Secondary | ICD-10-CM | POA: Diagnosis not present

## 2020-07-20 DIAGNOSIS — M79662 Pain in left lower leg: Secondary | ICD-10-CM | POA: Diagnosis not present

## 2020-07-20 DIAGNOSIS — R262 Difficulty in walking, not elsewhere classified: Secondary | ICD-10-CM

## 2020-07-20 NOTE — Therapy (Signed)
[place], [place], [address] Phone: [phone]   Fax:  [phone]  Physical Therapy Treatment  Patient Details  Name: Oscar Sparks MRN: [id number] Date of Birth: July 14, 1940 Referring Provider (PT): Dr. Erlinda Hong   Encounter Date: 07/20/2020   PT End of Session - 07/20/20 0756    Visit Number 5    Number of Visits 20    Date for PT Re-Evaluation 09/10/20    Authorization Type Medicare, KX at 15    Progress Note Due on Visit 10    PT Start Time 0759    PT Stop Time 0838    PT Time Calculation (min) 39 min    Equipment Utilized During Treatment Gait belt    Activity Tolerance Patient tolerated treatment well;No increased pain    Behavior During Therapy WFL for tasks assessed/performed           Past Medical History:  Diagnosis Date  . Arthritis    knees  . Colon cancer ([place]) 09/24/2018  . ED (erectile dysfunction)   . 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 Peripheral vascular disease ([place])    poor curculation in hands and feet hard to monitor with pulse oximetry  . Prostate cancer [facility])    sees Dr. Gaynelle Arabian, received cesium seeds     Past Surgical History:  Procedure Laterality Date  . BACK SURGERY    . SPINE SURGERY  2004   Dr. Sherwood Gambler  . SUPERFICIAL PERONEAL NERVE RELEASE Left 06/23/2020   Procedure: left peroneal nerve neurolysis;  Surgeon: Leandrew Koyanagi, MD;  Location: [facility];  Service: Orthopedics;  Laterality: Left;  . TONSILLECTOMY      There were no vitals filed for this visit.   Subjective Assessment - 07/20/20 0803    Subjective Pt. stated he was able to do some garden stuff.  Pt. stated he has been exercising at home and trying to strengthen.  Pt. stated he felt he is making progress but slowly.    Patient Stated Goals Walk without the cane    Currently in Pain? No/denies    Pain Score 0-No pain              [facility] PT Assessment - 07/20/20 0001       Assessment   Medical Diagnosis Lt peroneal nerve decompression, foot drop    Referring Provider (PT) Dr. Erlinda Hong    Onset Date/Surgical Date 06/23/20    Hand Dominance Right      Single Leg Stance   Comments Continued poor control < 3 seconds bilateral but able to maintain balance independently                         [facility] Adult PT Treatment/Exercise - 07/20/20 0001      Neuro Re-ed    Neuro Re-ed Details  6 inch step alternating toe taps x 10 bilateral c SBA, modified tandem stance full step forward 1 min x 2 bilateral c SBA, heel to toe raises c no hand assist 2 x 10 each way c SBA      Knee/Hip Exercises: Aerobic   Nustep Lvl 6 10 mins      Knee/Hip Exercises: Machines for Strengthening   Total Gym Leg Press 81 lbs 3 x 10 double leg c green tband around knees for hip abduction hold      Knee/Hip Exercises: Standing   Lateral Step Up Both;10 reps;Step  Height: 6"   up and over on each leg   Other Standing Knee Exercises lateral stepping 10 ft x 5 each way green tband      Knee/Hip Exercises: Seated   Sit to Sand 10 reps;without UE support   18 inch chair                   PT Short Term Goals - 07/16/20 1516      PT SHORT TERM GOAL #1   Title Patient will demonstrate independent use of home exercise program to maintain progress from in clinic treatments.    Status Achieved             PT Long Term Goals - 07/16/20 1516      PT LONG TERM GOAL #1   Title Patient will demonstrate/report pain at worst less than or equal to 2/10 to facilitate minimal limitation in daily activity secondary to pain symptoms.    Status On-going      PT LONG TERM GOAL #2   Title Patient will demonstrate independent use of home exercise program to facilitate ability to maintain/progress functional gains from skilled physical therapy services.    Status On-going      PT LONG TERM GOAL #3   Title Pt. will demonstrate FOTO outcome > 55 to indicated reduced disability due to  condition.    Status On-going      PT LONG TERM GOAL #4   Title Pt. will demonstrate bilateral hip MMT > or = 4/5, ankle DF 5/5 bilateral to facilitate stability in ambulation, transfers independently.    Status On-going      PT LONG TERM GOAL #5   Title Pt. will demonstrate BERG > 45 to indicate reduced fall risk.    Status On-going      PT LONG TERM GOAL #6   Title Pt. will demonstrate independent ambulation community distances.    Status On-going                 Plan - 07/20/20 6834    Clinical Impression Statement Hip strength and overall movement coordination deficits continue to be evident in factors in difficulty walking at this time.  Was able to arrive and perform activities without AFO on Lt LE with no observed instances of foot drag.  Continued strenthening for LE and balance on compliant and non compliant surfaces indicated.    Personal Factors and Comorbidities Comorbidity 3+    Comorbidities 2020 Colon Cancer, Neuropathy in feet, PVD, prostate cancer.  Previously seen by clinic for Lt hip pain    Examination-Activity Limitations Squat;Bend;Stairs;Stand;Locomotion Level;Transfers    Examination-Participation Restrictions Community Activity;Shop;Yard Work    Merchant [employer] officer Evolving/Moderate complexity    PT Frequency 2x / week    PT Duration Other (comment)    PT Treatment/Interventions ADLs/Self Care Home Management;Electrical Stimulation;Iontophoresis 4mg /ml Dexamethasone;Moist Heat;Balance training;Therapeutic exercise;Therapeutic activities;Functional mobility training;Stair training;Gait training;DME Instruction;Ultrasound;Neuromuscular re-education;Patient/family education;Passive range of motion;Dry needling;Spinal Manipulations;Joint Manipulations;Taping;Manual techniques    PT Next Visit Plan Glute med/max strengthening, quad strengthening.  Static balance interventions on compliant and non compliant surfaces.    PT Home Exercise Plan  [id number]    Consulted and Agree with Plan of Care Patient           Patient will benefit from skilled therapeutic intervention in order to improve the following deficits and impairments:  Abnormal gait,Decreased endurance,Decreased activity tolerance,Pain,Decreased strength,Difficulty walking,Decreased mobility,Decreased balance,Decreased range of motion,Decreased coordination,Impaired perceived functional ability,Improper body mechanics  Visit Diagnosis:  Pain in left lower leg  Muscle weakness (generalized)  Difficulty in walking, not elsewhere classified     Problem List Patient Active Problem List   Diagnosis Date Noted  . 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 Pain in left hip 06/25/2014  . 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 Prostate cancer ([place]) 11/15/2009  . 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 CONTACT DERMATITIS 09/04/2007   Scot Jun, PT, DPT, OCS, ATC 07/20/20  8:41 AM    [facility] Physical Therapy [address] [place], [place], [address] Phone: [phone]   Fax:  [phone]  Name: JETT FUKUDA MRN: [id number] Date of Birth: 1940/08/07

## 2020-07-22 ENCOUNTER — Ambulatory Visit (INDEPENDENT_AMBULATORY_CARE_PROVIDER_SITE_OTHER): Payer: Medicare Other | Admitting: Rehabilitative and Restorative Service Providers"

## 2020-07-22 ENCOUNTER — Other Ambulatory Visit: Payer: Self-pay

## 2020-07-22 ENCOUNTER — Encounter: Payer: Self-pay | Admitting: Rehabilitative and Restorative Service Providers"

## 2020-07-22 DIAGNOSIS — R262 Difficulty in walking, not elsewhere classified: Secondary | ICD-10-CM

## 2020-07-22 DIAGNOSIS — M25552 Pain in left hip: Secondary | ICD-10-CM | POA: Diagnosis not present

## 2020-07-22 DIAGNOSIS — M6281 Muscle weakness (generalized): Secondary | ICD-10-CM | POA: Diagnosis not present

## 2020-07-22 DIAGNOSIS — M79662 Pain in left lower leg: Secondary | ICD-10-CM

## 2020-07-22 NOTE — Therapy (Signed)
[place], [place], [address] Phone: [phone]   Fax:  [phone]  Physical Therapy Treatment  Patient Details  Name: Oscar Sparks MRN: [id number] Date of Birth: 06/18/1940 Referring Provider (PT): Dr. Erlinda Hong   Encounter Date: 07/22/2020   PT End of Session - 07/22/20 1805    Visit Number 6    Number of Visits 20    Date for PT Re-Evaluation 09/10/20    Authorization Type Medicare, KX at 15    Progress Note Due on Visit 10    PT Start Time 2536    PT Stop Time 1225    PT Time Calculation (min) 40 min    Equipment Utilized During Treatment Gait belt    Activity Tolerance Patient tolerated treatment well;No increased pain    Behavior During Therapy WFL for tasks assessed/performed           Past Medical History:  Diagnosis Date  . Arthritis    knees  . Colon cancer ([place]) 09/24/2018  . ED (erectile dysfunction)   . 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 Peripheral vascular disease ([place])    poor curculation in hands and feet hard to monitor with pulse oximetry  . Prostate cancer [facility])    sees Dr. Gaynelle Arabian, received cesium seeds     Past Surgical History:  Procedure Laterality Date  . BACK SURGERY    . SPINE SURGERY  2004   Dr. Sherwood Gambler  . SUPERFICIAL PERONEAL NERVE RELEASE Left 06/23/2020   Procedure: left peroneal nerve neurolysis;  Surgeon: Leandrew Koyanagi, MD;  Location: [place];  Service: Orthopedics;  Laterality: Left;  . TONSILLECTOMY      There were no vitals filed for this visit.   Subjective Assessment - 07/22/20 1147    Subjective Oscar Sparks reports he has been working on a new deck.  Strength, balance and reaction time are priorities.    Patient Stated Goals Walk without the cane    Currently in Pain? No/denies    Pain Location Leg    Pain Orientation Left    Pain Descriptors / Indicators Sore    Pain Type Surgical pain    Pain Onset More than a month ago     Pain Frequency Occasional    Aggravating Factors  Poor endurance and reaction time/balance.    Multiple Pain Sites No                             [facility] Adult PT Treatment/Exercise - 07/22/20 0001      Therapeutic Activites    ADL's Step-up and over 4" 6" and 8" no hands and slow eccentrics and tandem balance 10X 10 seconds (difficult tandem with R foot forward to catch balance with step strategy)      Knee/Hip Exercises: Machines for Strengthening   Total Gym Leg Press 81# B legs full extension with slow eccentrics 2 sets of 10      Knee/Hip Exercises: Standing   Heel Raises Limitations Heel to toe raises with and without hands 10X each      Knee/Hip Exercises: Seated   Other Seated Knee/Hip Exercises Ankle theraband In/Ev/Df 2 sets of 10 Red slow eccentrics    Sit to Sand 2 sets;5 reps;without UE support;Other (comment)   slow eccentrics                 PT Education - 07/22/20 1804  Education Details Talked about strength, balance and step strategy as priorities at home.    Person(s) Educated Patient    Methods Explanation;Demonstration;Verbal cues    Comprehension Need further instruction;Returned demonstration;Verbal cues required;Verbalized understanding            PT Short Term Goals - 07/22/20 1804      PT SHORT TERM GOAL #1   Title Patient will demonstrate independent use of home exercise program to maintain progress from in clinic treatments.    Status Achieved             PT Long Term Goals - 07/22/20 1805      PT LONG TERM GOAL #1   Title Patient will demonstrate/report pain at worst less than or equal to 2/10 to facilitate minimal limitation in daily activity secondary to pain symptoms.    Status On-going      PT LONG TERM GOAL #2   Title Patient will demonstrate independent use of home exercise program to facilitate ability to maintain/progress functional gains from skilled physical therapy services.    Status On-going       PT LONG TERM GOAL #3   Title Pt. will demonstrate FOTO outcome > 55 to indicated reduced disability due to condition.    Status On-going      PT LONG TERM GOAL #4   Title Pt. will demonstrate bilateral hip MMT > or = 4/5, ankle DF 5/5 bilateral to facilitate stability in ambulation, transfers independently.    Status On-going      PT LONG TERM GOAL #5   Title Pt. will demonstrate BERG > 45 to indicate reduced fall risk.    Status On-going      PT LONG TERM GOAL #6   Title Pt. will demonstrate independent ambulation community distances.    Status On-going                 Plan - 07/22/20 1805    Clinical Impression Statement Oscar Sparks is working very at home and in the office with his physical therapy.  Strength, balance and reaction time (step strategy) are a focus and Oscar Sparks is making progress in all areas.  Continue supervised PT to reduce falls risk and help Oscar Sparks be as independent as possible given his very active lifestyle.    Personal Factors and Comorbidities Comorbidity 3+    Comorbidities 2020 Colon Cancer, Neuropathy in feet, PVD, prostate cancer.  Previously seen by clinic for Lt hip pain    Examination-Activity Limitations Squat;Bend;Stairs;Stand;Locomotion Level;Transfers    Examination-Participation Restrictions Community Activity;Shop;Yard Work    [employer]chant [employer] officer Evolving/Moderate complexity    PT Frequency 2x / week    PT Duration Other (comment)    PT Treatment/Interventions ADLs/Self Care Home Management;Electrical Stimulation;Iontophoresis 4mg /ml Dexamethasone;Moist Heat;Balance training;Therapeutic exercise;Therapeutic activities;Functional mobility training;Stair training;Gait training;DME Instruction;Ultrasound;Neuromuscular re-education;Patient/family education;Passive range of motion;Dry needling;Spinal Manipulations;Joint Manipulations;Taping;Manual techniques    PT Next Visit Plan Glute med/max strengthening, quad strengthening.  Static balance  interventions on compliant and non compliant surfaces.    PT Home Exercise Plan [email or web address]    Consulted and Agree with Plan of Care Patient           Patient will benefit from skilled therapeutic intervention in order to improve the following deficits and impairments:  Abnormal gait,Decreased endurance,Decreased activity tolerance,Pain,Decreased strength,Difficulty walking,Decreased mobility,Decreased balance,Decreased range of motion,Decreased coordination,Impaired perceived functional ability,Improper body mechanics  Visit Diagnosis: Difficulty in walking, not elsewhere classified  Muscle weakness (generalized)  Pain in left hip  Pain in  left lower leg     Problem List Patient Active Problem List   Diagnosis Date Noted  . 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 Pain in left hip 06/25/2014  . 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 Prostate cancer ([place]) 11/15/2009  . 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 CONTACT DERMATITIS 09/04/2007    Farley Ly PT, MPT 07/22/2020, 6:08 PM  [facility] Physical Therapy [address] [place], [place], [address] Phone: [phone]   Fax:  [phone]  Name: Oscar Sparks MRN: [id number] Date of Birth: Sep 16, 1940

## 2020-07-23 ENCOUNTER — Encounter: Payer: Medicare Other | Admitting: Rehabilitative and Restorative Service Providers"

## 2020-07-27 ENCOUNTER — Other Ambulatory Visit: Payer: Self-pay

## 2020-07-27 ENCOUNTER — Ambulatory Visit (INDEPENDENT_AMBULATORY_CARE_PROVIDER_SITE_OTHER): Payer: Medicare Other | Admitting: Physical Therapy

## 2020-07-27 ENCOUNTER — Encounter: Payer: Self-pay | Admitting: Physical Therapy

## 2020-07-27 DIAGNOSIS — M25552 Pain in left hip: Secondary | ICD-10-CM | POA: Diagnosis not present

## 2020-07-27 DIAGNOSIS — M6281 Muscle weakness (generalized): Secondary | ICD-10-CM | POA: Diagnosis not present

## 2020-07-27 DIAGNOSIS — M79662 Pain in left lower leg: Secondary | ICD-10-CM | POA: Diagnosis not present

## 2020-07-27 DIAGNOSIS — R262 Difficulty in walking, not elsewhere classified: Secondary | ICD-10-CM | POA: Diagnosis not present

## 2020-07-27 NOTE — Patient Instructions (Signed)
Access Code: [email or web address] URL: [email or web address] Date: 07/27/2020 Prepared by: Jeral Pinch  Exercises Clamshell - 1-2 x daily - 7 x weekly - 3 sets - 10 reps Supine Bridge - 1-2 x daily - 7 x weekly - 3 sets - 10 reps - 2 hold Seated Straight Leg Heel Taps - 1-2 x daily - 7 x weekly - 3 sets - 10 reps Standing Toe Raises at Chair - 1-2 x daily - 7 x weekly - 3 sets - 10 reps Standing Heel Raise with Support - 1-2 x daily - 7 x weekly - 3 sets - 10 reps Single Leg Balance with Clock Reach - 1 x daily - 2-3 sets - 10 reps Seated Thoracic Self-Mobilization - 1 x daily - 10 reps Thoracic Extension Mobilization with yoga mat - 1 x daily - 10 reps supine shoulder flexion with weights - 1 x daily - 2-3 sets - 10 reps Prone Alternating Arm and Leg Lifts - 1 x daily - 2-3 sets - 10 reps

## 2020-07-27 NOTE — Therapy (Signed)
[place], [place], [address] Phone: [phone]   Fax:  [phone]  Physical Therapy Treatment  Patient Details  Name: Oscar Sparks MRN: [id number] Date of Birth: 02-Oct-1940 Referring Provider (PT): Dr. Erlinda Hong   Encounter Date: 07/27/2020   PT End of Session - 07/27/20 0846    Visit Number 7    Number of Visits 20    Date for PT Re-Evaluation 09/10/20    Authorization Type Medicare, KX at 15    Progress Note Due on Visit 10    PT Start Time 0846    PT Stop Time 0931    PT Time Calculation (min) 45 min           Past Medical History:  Diagnosis Date  . Arthritis    knees  . Colon cancer ([place]) 09/24/2018  . ED (erectile dysfunction)   . 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 Peripheral vascular disease ([place])    poor curculation in hands and feet hard to monitor with pulse oximetry  . Prostate cancer [facility])    sees Dr. Gaynelle Arabian, received cesium seeds     Past Surgical History:  Procedure Laterality Date  . BACK SURGERY    . SPINE SURGERY  2004   Dr. Sherwood Gambler  . SUPERFICIAL PERONEAL NERVE RELEASE Left 06/23/2020   Procedure: left peroneal nerve neurolysis;  Surgeon: Leandrew Koyanagi, MD;  Location: [place];  Service: Orthopedics;  Laterality: Left;  . TONSILLECTOMY      There were no vitals filed for this visit.   Subjective Assessment - 07/27/20 0847    Subjective Bill reports he has had a good week, he has only fallen 3x and this is good for him.  Always falls forward when walking in the yard.    Patient Stated Goals Walk without the cane    Currently in Pain? No/denies                             [facility] Adult PT Treatment/Exercise - 07/27/20 0001      Exercises   Other Exercises  seated thoracic mobilization into extension over back of chair then supine over half bolster with 3 pillows under head , 2x10 lat pull downs in hookling with TA engagement  using 7#   prone 10 reps hip ext and UE lifts - not able to lift upper or lower body     Knee/Hip Exercises: Aerobic   Nustep Lvl 7 10 mins      Knee/Hip Exercises: Standing   SLS with Vectors 3x10 each side - clock work with PRN HHA standing on Lt LE much more difficult                    PT Short Term Goals - 07/22/20 1804      PT SHORT TERM GOAL #1   Title Patient will demonstrate independent use of home exercise program to maintain progress from in clinic treatments.    Status Achieved             PT Long Term Goals - 07/22/20 1805      PT LONG TERM GOAL #1   Title Patient will demonstrate/report pain at worst less than or equal to 2/10 to facilitate minimal limitation in daily activity secondary to pain symptoms.    Status On-going      PT LONG TERM GOAL #2  Title Patient will demonstrate independent use of home exercise program to facilitate ability to maintain/progress functional gains from skilled physical therapy services.    Status On-going      PT LONG TERM GOAL #3   Title Pt. will demonstrate FOTO outcome > 55 to indicated reduced disability due to condition.    Status On-going      PT LONG TERM GOAL #4   Title Pt. will demonstrate bilateral hip MMT > or = 4/5, ankle DF 5/5 bilateral to facilitate stability in ambulation, transfers independently.    Status On-going      PT LONG TERM GOAL #5   Title Pt. will demonstrate BERG > 45 to indicate reduced fall risk.    Status On-going      PT LONG TERM GOAL #6   Title Pt. will demonstrate independent ambulation community distances.    Status On-going                 Plan - 07/27/20 0849    Clinical Impression Statement [name] states he is still falling when walking on uneven surfaces in the yard however it is happening less often.  His HEP was progressed today to add in some SLS activites as well as back of the body strenthening and mobility. He had difficulty with these as his Lt LE and  posterior chain are very weak.  He would benefit from continued therapy to address these as well as improve his balance    PT Frequency 2x / week    PT Duration Other (comment)    PT Treatment/Interventions ADLs/Self Care Home Management;Electrical Stimulation;Iontophoresis 4mg /ml Dexamethasone;Moist Heat;Balance training;Therapeutic exercise;Therapeutic activities;Functional mobility training;Stair training;Gait training;DME Instruction;Ultrasound;Neuromuscular re-education;Patient/family education;Passive range of motion;Dry needling;Spinal Manipulations;Joint Manipulations;Taping;Manual techniques    PT Next Visit Plan Glute med/max strengthening, quad strengthening.  Static balance interventions on compliant and non compliant surfaces.    PT Home Exercise Plan [email or web address]    Consulted and Agree with Plan of Care Patient           Patient will benefit from skilled therapeutic intervention in order to improve the following deficits and impairments:  Abnormal gait,Decreased endurance,Decreased activity tolerance,Pain,Decreased strength,Difficulty walking,Decreased mobility,Decreased balance,Decreased range of motion,Decreased coordination,Impaired perceived functional ability,Improper body mechanics  Visit Diagnosis: Difficulty in walking, not elsewhere classified  Muscle weakness (generalized)  Pain in left hip  Pain in left lower leg     Problem List Patient Active Problem List   Diagnosis Date Noted  . 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 Pain in left hip 06/25/2014  . 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 Prostate cancer ([place]) 11/15/2009  . 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 CONTACT DERMATITIS 09/04/2007     Boneta Lucks rPT  07/27/2020, 10:18 AM  [facility] Physical Therapy [address] [place], [place], [address] Phone: [phone]   Fax:  [phone]  Name: EVONTE PRESTAGE MRN: [id number] Date of Birth: 1941-03-17

## 2020-07-29 ENCOUNTER — Encounter: Payer: Self-pay | Admitting: Rehabilitative and Restorative Service Providers"

## 2020-07-29 ENCOUNTER — Ambulatory Visit (INDEPENDENT_AMBULATORY_CARE_PROVIDER_SITE_OTHER): Payer: Medicare Other | Admitting: Rehabilitative and Restorative Service Providers"

## 2020-07-29 ENCOUNTER — Other Ambulatory Visit: Payer: Self-pay

## 2020-07-29 DIAGNOSIS — R262 Difficulty in walking, not elsewhere classified: Secondary | ICD-10-CM | POA: Diagnosis not present

## 2020-07-29 DIAGNOSIS — M79662 Pain in left lower leg: Secondary | ICD-10-CM

## 2020-07-29 DIAGNOSIS — M6281 Muscle weakness (generalized): Secondary | ICD-10-CM | POA: Diagnosis not present

## 2020-07-29 NOTE — Patient Instructions (Signed)
Access Code: [email or web address] URL: [email or web address] Date: 07/29/2020 Prepared by: Scot Jun  Exercises Clamshell - 1-2 x daily - 7 x weekly - 3 sets - 10 reps Supine Bridge - 1-2 x daily - 7 x weekly - 3 sets - 10 reps - 2 hold Seated Straight Leg Heel Taps - 1-2 x daily - 7 x weekly - 3 sets - 10 reps Standing Toe Raises at Chair - 1-2 x daily - 7 x weekly - 3 sets - 10 reps Standing Heel Raise with Support - 1-2 x daily - 7 x weekly - 3 sets - 10 reps Single Leg Balance with Clock Reach - 1 x daily - 2-3 sets - 10 reps Seated Thoracic Self-Mobilization - 1 x daily - 10 reps Thoracic Extension Mobilization with yoga mat - 1 x daily - 10 reps supine shoulder flexion with weights - 1 x daily - 2-3 sets - 10 reps Prone Alternating Arm and Leg Lifts - 1 x daily - 2-3 sets - 10 reps Seated Ankle Dorsiflexion with Resistance - 1 x daily - 7 x weekly - 3 sets - 10 reps Seated Ankle Eversion with Resistance - 1 x daily - 7 x weekly - 3 sets - 10 reps

## 2020-07-29 NOTE — Therapy (Signed)
[place], [place], [address] Phone: [phone]   Fax:  [phone]  Physical Therapy Treatment  Patient Details  Name: Oscar Sparks MRN: [id number] Date of Birth: 1941/02/05 Referring Provider (PT): Dr. Erlinda Hong   Encounter Date: 07/29/2020   PT End of Session - 07/29/20 0804    Visit Number 8    Number of Visits 20    Date for PT Re-Evaluation 09/10/20    Authorization Type Medicare, KX at 15    Progress Note Due on Visit 10    PT Start Time 0759    PT Stop Time 0839    PT Time Calculation (min) 40 min    Activity Tolerance Patient tolerated treatment well    Behavior During Therapy [facility] for tasks assessed/performed           Past Medical History:  Diagnosis Date  . Arthritis    knees  . Colon cancer ([place]) 09/24/2018  . ED (erectile dysfunction)   . 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 Peripheral vascular disease ([place])    poor curculation in hands and feet hard to monitor with pulse oximetry  . Prostate cancer [facility])    sees Dr. Gaynelle Arabian, received cesium seeds     Past Surgical History:  Procedure Laterality Date  . BACK SURGERY    . SPINE SURGERY  2004   Dr. Sherwood Gambler  . SUPERFICIAL PERONEAL NERVE RELEASE Left 06/23/2020   Procedure: left peroneal nerve neurolysis;  Surgeon: Leandrew Koyanagi, MD;  Location: [place];  Service: Orthopedics;  Laterality: Left;  . TONSILLECTOMY      There were no vitals filed for this visit.   Subjective Assessment - 07/29/20 0802    Subjective Pt. indicated no pain complaints.  Pt. stated he seemed to notice the left foot not dragging at much.  Pt. indicated going without AFO mostly now, only a few occasional times wearing it.    Patient Stated Goals Walk without the cane    Currently in Pain? No/denies    Pain Score 0-No pain              [facility] PT Assessment - 07/29/20 0001      Assessment   Medical Diagnosis Lt peroneal nerve  decompression, foot drop    Referring Provider (PT) Dr. Erlinda Hong    Onset Date/Surgical Date 06/23/20    Hand Dominance Right      Strength   Right Ankle Dorsiflexion 5/5   39, 37.5 lbs   Right Ankle Eversion 5/5   28, 29 lbs   Left Ankle Dorsiflexion 4+/5   30, 31 lbs   Left Ankle Eversion 3+/5   8.8, 9 lbs                        [facility] Adult PT Treatment/Exercise - 07/29/20 0001      Neuro Re-ed    Neuro Re-ed Details  single leg stance c vector reach fwd/side/back x 10 each performed bilateral, alt toe/heel lift with no arm assist 20x, tandem stance on airex c occasional hand assist 1 min x 2 bilateral      Exercises   Other Exercises  reviewed previous visit thoracic extension movement      Knee/Hip Exercises: Aerobic   Nustep Lvl 7 10 mins      Knee/Hip Exercises: Seated   Other Seated Knee/Hip Exercises ankle DF blue band 3 x 10 Lt,  eversion, inversion x20 blue each Lt                    PT Short Term Goals - 07/22/20 1804      PT SHORT TERM GOAL #1   Title Patient will demonstrate independent use of home exercise program to maintain progress from in clinic treatments.    Status Achieved             PT Long Term Goals - 07/22/20 1805      PT LONG TERM GOAL #1   Title Patient will demonstrate/report pain at worst less than or equal to 2/10 to facilitate minimal limitation in daily activity secondary to pain symptoms.    Status On-going      PT LONG TERM GOAL #2   Title Patient will demonstrate independent use of home exercise program to facilitate ability to maintain/progress functional gains from skilled physical therapy services.    Status On-going      PT LONG TERM GOAL #3   Title Pt. will demonstrate FOTO outcome > 55 to indicated reduced disability due to condition.    Status On-going      PT LONG TERM GOAL #4   Title Pt. will demonstrate bilateral hip MMT > or = 4/5, ankle DF 5/5 bilateral to facilitate stability in ambulation,  transfers independently.    Status On-going      PT LONG TERM GOAL #5   Title Pt. will demonstrate BERG > 45 to indicate reduced fall risk.    Status On-going      PT LONG TERM GOAL #6   Title Pt. will demonstrate independent ambulation community distances.    Status On-going                 Plan - 07/29/20 5784    Clinical Impression Statement Recheck of ankle strength today revealed reversion of strength in DF and eversion measurements as noted.  Refocused and reviewed ankle strengthening through resistance band activity to address and encourage improvement.  Continued use of neuro muscular re-education balance intervention to challenged ankle/hip balance strategies.    Comorbidities 2020 Colon Cancer, Neuropathy in feet, PVD, prostate cancer.  Previously seen by clinic for Lt hip pain    Examination-Activity Limitations Squat;Bend;Stairs;Stand;Locomotion Level;Transfers    Examination-Participation Restrictions Community Activity;Shop;Yard Work    [employer]chant [employer] officer Evolving/Moderate complexity    Clinical Decision Making Moderate    Rehab Potential --   fair to good   PT Frequency 2x / week    PT Duration Other (comment)    PT Treatment/Interventions ADLs/Self Care Home Management;Electrical Stimulation;Iontophoresis 4mg /ml Dexamethasone;Moist Heat;Balance training;Therapeutic exercise;Therapeutic activities;Functional mobility training;Stair training;Gait training;DME Instruction;Ultrasound;Neuromuscular re-education;Patient/family education;Passive range of motion;Dry needling;Spinal Manipulations;Joint Manipulations;Taping;Manual techniques    PT Next Visit Plan ankle dorsiflexion, eversion strengthening, dynamic stability interventions, compliant and non compliant surface balance.    PT Home Exercise Plan [email or web address]    Consulted and Agree with Plan of Care Patient           Patient will benefit from skilled therapeutic intervention in order to improve the  following deficits and impairments:  Abnormal gait,Decreased endurance,Decreased activity tolerance,Pain,Decreased strength,Difficulty walking,Decreased mobility,Decreased balance,Decreased range of motion,Decreased coordination,Impaired perceived functional ability,Improper body mechanics  Visit Diagnosis: Pain in left lower leg  Muscle weakness (generalized)  Difficulty in walking, not elsewhere classified     Problem List Patient Active Problem List   Diagnosis Date Noted  . 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 Pain in left hip 06/25/2014  . 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 Prostate cancer ([place]) 11/15/2009  . 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 CONTACT DERMATITIS 09/04/2007   Scot Jun, PT, DPT, OCS, ATC 07/29/20  8:35 AM    [facility] Physical Therapy [address] [place], [place], [address] Phone: [phone]   Fax:  [phone]  Name: Oscar Sparks MRN: [id number] Date of Birth: 1940-04-19

## 2020-08-02 ENCOUNTER — Other Ambulatory Visit: Payer: Self-pay

## 2020-08-02 ENCOUNTER — Ambulatory Visit (INDEPENDENT_AMBULATORY_CARE_PROVIDER_SITE_OTHER): Payer: Medicare Other | Admitting: Physical Therapy

## 2020-08-02 DIAGNOSIS — M6281 Muscle weakness (generalized): Secondary | ICD-10-CM | POA: Diagnosis not present

## 2020-08-02 DIAGNOSIS — R262 Difficulty in walking, not elsewhere classified: Secondary | ICD-10-CM | POA: Diagnosis not present

## 2020-08-02 DIAGNOSIS — M79662 Pain in left lower leg: Secondary | ICD-10-CM | POA: Diagnosis not present

## 2020-08-02 NOTE — Therapy (Signed)
[place], [place], [address] Phone: [phone]   Fax:  [phone]  Physical Therapy Treatment  Patient Details  Name: Oscar Sparks MRN: [id number] Date of Birth: 27-Sep-1940 Referring Provider (PT): Dr. Erlinda Hong   Encounter Date: 08/02/2020   PT End of Session - 08/02/20 0820    Visit Number 9    Number of Visits 20    Date for PT Re-Evaluation 09/10/20    Authorization Type Medicare, KX at 15    Progress Note Due on Visit 10    PT Start Time 0803    PT Stop Time 0843    PT Time Calculation (min) 40 min    Activity Tolerance Patient tolerated treatment well    Behavior During Therapy [facility] for tasks assessed/performed           Past Medical History:  Diagnosis Date  . Arthritis    knees  . Colon cancer ([place]) 09/24/2018  . ED (erectile dysfunction)   . 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 Peripheral vascular disease ([place])    poor curculation in hands and feet hard to monitor with pulse oximetry  . Prostate cancer [facility])    sees Dr. Gaynelle Arabian, received cesium seeds     Past Surgical History:  Procedure Laterality Date  . BACK SURGERY    . SPINE SURGERY  2004   Dr. Sherwood Gambler  . SUPERFICIAL PERONEAL NERVE RELEASE Left 06/23/2020   Procedure: left peroneal nerve neurolysis;  Surgeon: Leandrew Koyanagi, MD;  Location: [place];  Service: Orthopedics;  Laterality: Left;  . TONSILLECTOMY      There were no vitals filed for this visit.   Subjective Assessment - 08/02/20 0818    Subjective Pt. indicated no pain but still with weakness paticularly with ankle eversion    Limitations Walking    Patient Stated Goals Walk without the cane    Pain Onset More than a month ago            [facility] Adult PT Treatment/Exercise - 08/02/20 0001      Knee/Hip Exercises: Stretches   Gastroc Stretch Both;2 reps;30 seconds    Gastroc Stretch Limitations slantboard    Soleus Stretch Both;2 reps;30  seconds    Soleus Stretch Limitations slantboard      Knee/Hip Exercises: Aerobic   Nustep Lvl 7 10 mins      Ankle Exercises: Standing   Other Standing Ankle Exercises tandem balance 30 sec X2 bilat on foam intermittent UE support PRN. Walking up/down ramp X3 fwd, and X3 lateral ea side    Other Standing Ankle Exercises heel and toe raises on airex pad 3X10      Ankle Exercises: Sidelying   Ankle Eversion Left   2X15 with 1#     Ankle Exercises: Supine   Other Supine Ankle Exercises alphabet on Lt 3# X1    Other Supine Ankle Exercises ankle DF,EV,INV green X20 ea                    PT Short Term Goals - 07/22/20 1804      PT SHORT TERM GOAL #1   Title Patient will demonstrate independent use of home exercise program to maintain progress from in clinic treatments.    Status Achieved             PT Long Term Goals - 07/22/20 1805      PT LONG TERM GOAL #1  Title Patient will demonstrate/report pain at worst less than or equal to 2/10 to facilitate minimal limitation in daily activity secondary to pain symptoms.    Status On-going      PT LONG TERM GOAL #2   Title Patient will demonstrate independent use of home exercise program to facilitate ability to maintain/progress functional gains from skilled physical therapy services.    Status On-going      PT LONG TERM GOAL #3   Title Pt. will demonstrate FOTO outcome > 55 to indicated reduced disability due to condition.    Status On-going      PT LONG TERM GOAL #4   Title Pt. will demonstrate bilateral hip MMT > or = 4/5, ankle DF 5/5 bilateral to facilitate stability in ambulation, transfers independently.    Status On-going      PT LONG TERM GOAL #5   Title Pt. will demonstrate BERG > 45 to indicate reduced fall risk.    Status On-going      PT LONG TERM GOAL #6   Title Pt. will demonstrate independent ambulation community distances.    Status On-going                 Plan - 08/02/20 0902     Clinical Impression Statement Continued to work on ankle strength and stability with focus on eversion strength and overall stability. He shows good effort with PT and had no complaints of pain. Continue POC    Comorbidities 2020 Colon Cancer, Neuropathy in feet, PVD, prostate cancer.  Previously seen by clinic for Lt hip pain    Examination-Activity Limitations Squat;Bend;Stairs;Stand;Locomotion Level;Transfers    Examination-Participation Restrictions Community Activity;Shop;Yard Work    Product manager    Rehab Potential --   fair to good   PT Frequency 2x / week    PT Duration Other (comment)    PT Treatment/Interventions ADLs/Self Care Home Management;Electrical Stimulation;Iontophoresis 4mg /ml Dexamethasone;Moist Heat;Balance training;Therapeutic exercise;Therapeutic activities;Functional mobility training;Stair training;Gait training;DME Instruction;Ultrasound;Neuromuscular re-education;Patient/family education;Passive range of motion;Dry needling;Spinal Manipulations;Joint Manipulations;Taping;Manual techniques    PT Next Visit Plan ankle dorsiflexion, eversion strengthening, dynamic stability interventions, compliant and non compliant surface balance.    PT Home Exercise Plan [id number]    Consulted and Agree with Plan of Care Patient           Patient will benefit from skilled therapeutic intervention in order to improve the following deficits and impairments:  Abnormal gait,Decreased endurance,Decreased activity tolerance,Pain,Decreased strength,Difficulty walking,Decreased mobility,Decreased balance,Decreased range of motion,Decreased coordination,Impaired perceived functional ability,Improper body mechanics  Visit Diagnosis: Pain in left lower leg  Muscle weakness (generalized)  Difficulty in walking, not elsewhere classified     Problem List Patient Active Problem List   Diagnosis Date Noted  . 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 Pain in left hip 06/25/2014  . 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 Prostate cancer ([place]) 11/15/2009  . 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 CONTACT DERMATITIS 09/04/2007    Debbe Odea ,PT,DPT 08/02/2020, [facility] [address] [place], [place], [address] Phone: [phone]   Fax:  [phone]  Name: DONTRELLE MAZON MRN: [id number] Date of Birth: 11/01/1940

## 2020-08-04 ENCOUNTER — Encounter: Payer: Self-pay | Admitting: Rehabilitative and Restorative Service Providers"

## 2020-08-04 ENCOUNTER — Ambulatory Visit (INDEPENDENT_AMBULATORY_CARE_PROVIDER_SITE_OTHER): Payer: Medicare Other | Admitting: Rehabilitative and Restorative Service Providers"

## 2020-08-04 ENCOUNTER — Ambulatory Visit (INDEPENDENT_AMBULATORY_CARE_PROVIDER_SITE_OTHER): Payer: Medicare Other | Admitting: Orthopaedic Surgery

## 2020-08-04 ENCOUNTER — Other Ambulatory Visit: Payer: Self-pay

## 2020-08-04 DIAGNOSIS — R262 Difficulty in walking, not elsewhere classified: Secondary | ICD-10-CM

## 2020-08-04 DIAGNOSIS — M6281 Muscle weakness (generalized): Secondary | ICD-10-CM | POA: Diagnosis not present

## 2020-08-04 DIAGNOSIS — M79662 Pain in left lower leg: Secondary | ICD-10-CM

## 2020-08-04 DIAGNOSIS — G5702 Lesion of sciatic nerve, left lower limb: Secondary | ICD-10-CM

## 2020-08-04 DIAGNOSIS — M25552 Pain in left hip: Secondary | ICD-10-CM

## 2020-08-04 NOTE — Progress Notes (Signed)
   Post-Op Visit Note   Patient: Oscar Sparks           Date of Birth: 04-05-1940           MRN: [id number] Visit Date: 08/04/2020 PCP: Laurey Morale, MD   Assessment & Plan:  Chief Complaint:  Chief Complaint  Patient presents with  . Left Leg - Routine Post Op    06/13/20 left peroneal nerve neurolysis    Visit Diagnoses:  1. 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  He is making slow improvements with physical therapy and overall.  He is doing physical therapy twice a week.  Reports no pain.  He has noticed some slight improvement in ankle dorsiflexion.  Left leg surgical scars fully healed.  Positive Tinel at the common peroneal nerve.  He has 2 out of 5 ankle dorsiflexion.  Decreased sensation in the deep peroneal nerve distribution.  Oscar Sparks is making slow but steady progress.  He understands that this will take some time at least 6 months to see significant improvement.  In the meantime he will continue to do physical therapy and home exercises.  I will like to recheck him in 3 months.  Follow-Up Instructions: Return in about 3 months (around 11/04/2020).   Orders:  No orders of the defined types were placed in this encounter.  No orders of the defined types were placed in this encounter.   Imaging: No results found.  PMFS History: Patient Active Problem List   Diagnosis Date Noted  . 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 Pain in left hip 06/25/2014  . 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 Prostate cancer ([place]) 11/15/2009  . 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 CONTACT DERMATITIS 09/04/2007   Past  Medical History:  Diagnosis Date  . Arthritis    knees  . Colon cancer ([place]) 09/24/2018  . ED (erectile dysfunction)   . 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 Peripheral vascular disease ([place])    poor curculation in hands and feet hard to monitor with pulse oximetry  . Prostate cancer [facility])    sees Dr. Gaynelle Arabian, received cesium seeds     Family History  Problem Relation Age of Onset  . Breast cancer Other     Past Surgical History:  Procedure Laterality Date  . BACK SURGERY    . SPINE SURGERY  2004   Dr. Sherwood Gambler  . SUPERFICIAL PERONEAL NERVE RELEASE Left 06/23/2020   Procedure: left peroneal nerve neurolysis;  Surgeon: Leandrew Koyanagi, MD;  Location: [place];  Service: Orthopedics;  Laterality: Left;  . TONSILLECTOMY     Social History   Occupational History  . Not on file  Tobacco Use  . Smoking status: Never Smoker  . Smokeless tobacco: Never Used  Vaping Use  . Vaping Use: Never used  Substance and Sexual Activity  . Alcohol use: Yes    Alcohol/week: 1.0 standard drink    Types: 1 Glasses of wine per week  . Drug use: No  . Sexual activity: Not on file

## 2020-08-04 NOTE — Therapy (Signed)
[place], [place], [address] Phone: [phone]   Fax:  [phone]  Physical Therapy Treatment/Progress note  Patient Details  Name: Oscar Sparks MRN: [id number] Date of Birth: 1941/01/05 Referring Provider (PT): Dr. Erlinda Hong   Encounter Date: 08/04/2020   Progress Note Reporting Period 07/02/2020 to 08/04/2020  See note below for Objective Data and Assessment of Progress/Goals.        PT End of Session - 08/04/20 0805    Visit Number 10    Number of Visits 20    Date for PT Re-Evaluation 09/10/20    Authorization Type Medicare, KX at 15    Progress Note Due on Visit 20    PT Start Time 0759    PT Stop Time 0839    PT Time Calculation (min) 40 min    Activity Tolerance Patient tolerated treatment well    Behavior During Therapy [facility] for tasks assessed/performed           Past Medical History:  Diagnosis Date  . Arthritis    knees  . Colon cancer ([place]) 09/24/2018  . ED (erectile dysfunction)   . 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 Peripheral vascular disease ([place])    poor curculation in hands and feet hard to monitor with pulse oximetry  . Prostate cancer [facility])    sees Dr. Gaynelle Arabian, received cesium seeds     Past Surgical History:  Procedure Laterality Date  . BACK SURGERY    . SPINE SURGERY  2004   Dr. Sherwood Gambler  . SUPERFICIAL PERONEAL NERVE RELEASE Left 06/23/2020   Procedure: left peroneal nerve neurolysis;  Surgeon: Leandrew Koyanagi, MD;  Location: [place];  Service: Orthopedics;  Laterality: Left;  . TONSILLECTOMY      There were no vitals filed for this visit.   Subjective Assessment - 08/04/20 0803    Subjective Pt. stated no pain complaints today. Global Rating of change indicated at +0 about the same.  Pt. stated using cane for walking most of the time and still had concerns about catching Lt foot and falling.  Currently not using brace.    Limitations  Walking    Patient Stated Goals Walk without the cane    Currently in Pain? No/denies    Pain Score 0-No pain    Pain Onset More than a month ago              [facility] PT Assessment - 08/04/20 0001      Assessment   Medical Diagnosis Lt peroneal nerve decompression, foot drop    Referring Provider (PT) Dr. Erlinda Hong    Onset Date/Surgical Date 06/23/20    Hand Dominance Right      Observation/Other Assessments   Focus on Therapeutic Outcomes (FOTO)  update 57%      Single Leg Stance   Comments Able to attempt for up to 3 seconds without assistance, able to correct balance independently c stepping.      Strength   Right Ankle Dorsiflexion 5/5    Left Ankle Dorsiflexion 4+/5   27.4, 28.8 lbs   Left Ankle Eversion 3+/5   11.8, 11.5 lbs     Ambulation/Gait   Gait Comments SPC in Rt UE, no AFO use into clinic.      Berg Balance Test   Sit to Stand Able to stand without using hands and stabilize independently    Standing Unsupported Able to stand safely 2 minutes  Sitting with Back Unsupported but Feet Supported on Floor or Stool Able to sit safely and securely 2 minutes    Stand to Sit Sits safely with minimal use of hands    Transfers Able to transfer safely, minor use of hands    Standing Unsupported with Eyes Closed Able to stand 10 seconds safely    Standing Unsupported with Feet Together Able to place feet together independently and stand 1 minute safely    From Standing, Reach Forward with Outstretched Arm Can reach forward >5 cm safely (2")    From Standing Position, Pick up Object from Floor Able to pick up shoe safely and easily    From Standing Position, Turn to Look Behind Over each Shoulder Looks behind from both sides and weight shifts well    Turn 360 Degrees Able to turn 360 degrees safely one side only in 4 seconds or less    Standing Unsupported, Alternately Place Feet on Step/Stool Able to complete >2 steps/needs minimal assist    Standing Unsupported, One Foot in  Front Able to plae foot ahead of the other independently and hold 30 seconds    Standing on One Leg Tries to lift leg/unable to hold 3 seconds but remains standing independently    Total Score 46                         [facility] Adult PT Treatment/Exercise - 08/04/20 0001      Neuro Re-ed    Neuro Re-ed Details  single leg stance attempts < 5 seconds variable x 5 bilateral, modified tandem stance 1 min x 1 bilateral, heel to toe raises for ankle balance strategy 2 x 20 each   Berg balance testing also performed     Knee/Hip Exercises: Aerobic   Nustep Lvl 7 10 mins      Knee/Hip Exercises: Seated   Other Seated Knee/Hip Exercises ankle DF blue band 3 x 15 Lt,  inversion 2x15 blue each Lt.  eversion isometric hold 5 seconds x 10 Lt    Sit to Sand 5 reps;without UE support   18 inch chair                   PT Short Term Goals - 07/22/20 1804      PT SHORT TERM GOAL #1   Title Patient will demonstrate independent use of home exercise program to maintain progress from in clinic treatments.    Status Achieved             PT Long Term Goals - 08/04/20 0836      PT LONG TERM GOAL #1   Title Patient will demonstrate/report pain at worst less than or equal to 2/10 to facilitate minimal limitation in daily activity secondary to pain symptoms.    Status Achieved      PT LONG TERM GOAL #2   Title Patient will demonstrate independent use of home exercise program to facilitate ability to maintain/progress functional gains from skilled physical therapy services.    Status On-going    Target Date 09/10/20      PT LONG TERM GOAL #3   Title Pt. will demonstrate FOTO outcome > 55 to indicated reduced disability due to condition.    Status Achieved    Target Date 09/10/20      PT LONG TERM GOAL #4   Title Pt. will demonstrate bilateral hip MMT > or = 4/5, ankle DF 5/5 bilateral to facilitate  stability in ambulation, transfers independently.    Status On-going     Target Date 09/10/20      PT LONG TERM GOAL #5   Title Pt. will demonstrate BERG > 45 to indicate reduced fall risk.    Status Achieved    Target Date 09/10/20      PT LONG TERM GOAL #6   Title Pt. will demonstrate independent ambulation community distances.    Status On-going    Target Date 09/10/20                 Plan - 08/04/20 0998    Clinical Impression Statement Pt. has attended 10 visits overall during course of treatment. No pain complaints to indicate.  See objective data for updated information.  Pt. has demonstrated gain in BERG balance testing compared to evaluation.  Strength deficits related to Lt ankle still noted, most obviously for eversion at this time as well as easier fatigue.  Weakness and fatigue can contribute to increased chance of fall risk due to decreased foot clearance in ambulation as well as instability on uneven surfaces at this time.  Continued skilled PT services paired c HEP indicated at this time to continue to attempt to improve towards goals.    Comorbidities 2020 Colon Cancer, Neuropathy in feet, PVD, prostate cancer.  Previously seen by clinic for Lt hip pain    Examination-Activity Limitations Squat;Bend;Stairs;Stand;Locomotion Level;Transfers    Examination-Participation Restrictions Community Activity;Shop;Yard Work    Product manager    Rehab Potential --   fair to good   PT Frequency 2x / week    PT Duration Other (comment)    PT Treatment/Interventions ADLs/Self Care Home Management;Electrical Stimulation;Iontophoresis 4mg /ml Dexamethasone;Moist Heat;Balance training;Therapeutic exercise;Therapeutic activities;Functional mobility training;Stair training;Gait training;DME Instruction;Ultrasound;Neuromuscular re-education;Patient/family education;Passive range of motion;Dry needling;Spinal Manipulations;Joint Manipulations;Taping;Manual techniques    PT Next Visit Plan ankle dorsiflexion,  eversion strengthening, dynamic stability interventions, compliant and non compliant surface balance.    PT Home Exercise Plan [id number]    Consulted and Agree with Plan of Care Patient           Patient will benefit from skilled therapeutic intervention in order to improve the following deficits and impairments:  Abnormal gait,Decreased endurance,Decreased activity tolerance,Pain,Decreased strength,Difficulty walking,Decreased mobility,Decreased balance,Decreased range of motion,Decreased coordination,Impaired perceived functional ability,Improper body mechanics  Visit Diagnosis: Pain in left lower leg  Muscle weakness (generalized)  Difficulty in walking, not elsewhere classified  Pain in left hip     Problem List Patient Active Problem List   Diagnosis Date Noted  . 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 Pain in left hip 06/25/2014  . 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 Prostate cancer ([place]) 11/15/2009  . 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 CONTACT DERMATITIS 09/04/2007    Scot Jun, PT, DPT, OCS, ATC 08/04/20  8:43 AM    [facility] [address] [place], [place], [address] Phone: [phone]   Fax:  [phone]  Name: Oscar Sparks MRN: [id number] Date of Birth: December 28, 1940

## 2020-08-09 ENCOUNTER — Encounter: Payer: Self-pay | Admitting: Rehabilitative and Restorative Service Providers"

## 2020-08-09 ENCOUNTER — Ambulatory Visit (INDEPENDENT_AMBULATORY_CARE_PROVIDER_SITE_OTHER): Payer: Medicare Other | Admitting: Rehabilitative and Restorative Service Providers"

## 2020-08-09 ENCOUNTER — Other Ambulatory Visit: Payer: Self-pay

## 2020-08-09 DIAGNOSIS — M25552 Pain in left hip: Secondary | ICD-10-CM | POA: Diagnosis not present

## 2020-08-09 DIAGNOSIS — R262 Difficulty in walking, not elsewhere classified: Secondary | ICD-10-CM | POA: Diagnosis not present

## 2020-08-09 DIAGNOSIS — M6281 Muscle weakness (generalized): Secondary | ICD-10-CM | POA: Diagnosis not present

## 2020-08-09 DIAGNOSIS — M79662 Pain in left lower leg: Secondary | ICD-10-CM | POA: Diagnosis not present

## 2020-08-09 NOTE — Therapy (Signed)
[facility] [place], [place], [address] Phone: [phone]   Fax:  [phone]  Physical Therapy Treatment  Patient Details  Name: Oscar Sparks MRN: [id number] Date of Birth: Aug 04, 1940 Referring Provider (PT): Dr. Erlinda Hong   Encounter Date: 08/09/2020   PT End of Session - 08/09/20 0755    Visit Number 11    Number of Visits 20    Date for PT Re-Evaluation 09/10/20    Authorization Type Medicare, KX at 15    Progress Note Due on Visit 20    PT Start Time 0757    PT Stop Time 0836    PT Time Calculation (min) 39 min    Activity Tolerance Patient tolerated treatment well    Behavior During Therapy [facility] for tasks assessed/performed           Past Medical History:  Diagnosis Date  . Arthritis    knees  . Colon cancer ([place]) 09/24/2018  . ED (erectile dysfunction)   . 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 Peripheral vascular disease ([place])    poor curculation in hands and feet hard to monitor with pulse oximetry  . Prostate cancer [facility])    sees Dr. Gaynelle Arabian, received cesium seeds     Past Surgical History:  Procedure Laterality Date  . BACK SURGERY    . SPINE SURGERY  2004   Dr. Sherwood Gambler  . SUPERFICIAL PERONEAL NERVE RELEASE Left 06/23/2020   Procedure: left peroneal nerve neurolysis;  Surgeon: Leandrew Koyanagi, MD;  Location: [place];  Service: Orthopedics;  Laterality: Left;  . TONSILLECTOMY      There were no vitals filed for this visit.   Subjective Assessment - 08/09/20 0801    Subjective Pt. indicated no pain.  Still having trouble c moving foot out to side.  Has picked up pushing against the wall for strengthening.    Limitations Walking    Patient Stated Goals Walk without the cane    Currently in Pain? No/denies    Pain Score 0-No pain    Pain Onset More than a month ago                             [facility] Adult PT Treatment/Exercise - 08/09/20 0001       Neuro Re-ed    Neuro Re-ed Details  fitter rocker board fwd/back 30 x each c moderate hand assist on bar, tandem ambulation fwd on foam in bars 10 ft x 12, tandem stance on foam 1 min x 2 bilateral c occasional hand assist      Knee/Hip Exercises: Aerobic   Nustep Lvl 7 10 mins      Knee/Hip Exercises: Seated   Other Seated Knee/Hip Exercises red tband eversion/DF combination achored to other foot 20x Lt    Sit to Sand without UE support;10 reps   slow lowering 18 inch chair     Ankle Exercises: Standing   Other Standing Ankle Exercises heel and toe raises on airex pad x 20                    PT Short Term Goals - 07/22/20 1804      PT SHORT TERM GOAL #1   Title Patient will demonstrate independent use of home exercise program to maintain progress from in clinic treatments.    Status Achieved  PT Long Term Goals - 08/04/20 0836      PT LONG TERM GOAL #1   Title Patient will demonstrate/report pain at worst less than or equal to 2/10 to facilitate minimal limitation in daily activity secondary to pain symptoms.    Status Achieved      PT LONG TERM GOAL #2   Title Patient will demonstrate independent use of home exercise program to facilitate ability to maintain/progress functional gains from skilled physical therapy services.    Status On-going    Target Date 09/10/20      PT LONG TERM GOAL #3   Title Pt. will demonstrate FOTO outcome > 55 to indicated reduced disability due to condition.    Status Achieved    Target Date 09/10/20      PT LONG TERM GOAL #4   Title Pt. will demonstrate bilateral hip MMT > or = 4/5, ankle DF 5/5 bilateral to facilitate stability in ambulation, transfers independently.    Status On-going    Target Date 09/10/20      PT LONG TERM GOAL #5   Title Pt. will demonstrate BERG > 45 to indicate reduced fall risk.    Status Achieved    Target Date 09/10/20      PT LONG TERM GOAL #6   Title Pt. will demonstrate independent  ambulation community distances.    Status On-going    Target Date 09/10/20                 Plan - 08/09/20 0821    Clinical Impression Statement Spent extra time today on uneven surface standing, walking activity to incorporate progress towards goal of reducing concern about uneven surface grass/yard ambulation.  Fair to good overall performance noted today.    Comorbidities 2020 Colon Cancer, Neuropathy in feet, PVD, prostate cancer.  Previously seen by clinic for Lt hip pain    Examination-Activity Limitations Squat;Bend;Stairs;Stand;Locomotion Level;Transfers    Examination-Participation Restrictions Community Activity;Shop;Yard Work    Product manager    Rehab Potential --   fair to good   PT Frequency 2x / week    PT Duration Other (comment)    PT Treatment/Interventions ADLs/Self Care Home Management;Electrical Stimulation;Iontophoresis 4mg /ml Dexamethasone;Moist Heat;Balance training;Therapeutic exercise;Therapeutic activities;Functional mobility training;Stair training;Gait training;DME Instruction;Ultrasound;Neuromuscular re-education;Patient/family education;Passive range of motion;Dry needling;Spinal Manipulations;Joint Manipulations;Taping;Manual techniques    PT Next Visit Plan ankle dorsiflexion, eversion strengthening, dynamic stability interventions, compliant and non compliant surface balance continued    PT Home Exercise Plan [id number]    Consulted and Agree with Plan of Care Patient           Patient will benefit from skilled therapeutic intervention in order to improve the following deficits and impairments:  Abnormal gait,Decreased endurance,Decreased activity tolerance,Pain,Decreased strength,Difficulty walking,Decreased mobility,Decreased balance,Decreased range of motion,Decreased coordination,Impaired perceived functional ability,Improper body mechanics  Visit Diagnosis: Pain in left lower leg  Muscle weakness  (generalized)  Difficulty in walking, not elsewhere classified  Pain in left hip     Problem List Patient Active Problem List   Diagnosis Date Noted  . 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 Pain in left hip 06/25/2014  . 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 Prostate cancer ([place]) 11/15/2009  . 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 CONTACT  DERMATITIS 09/04/2007    Scot Jun, PT, DPT, OCS, ATC 08/09/20  8:31 AM    [facility] [address] [place], [place], [address] Phone: [phone]   Fax:  [phone]  Name: ARDIS FULLWOOD MRN: [id number] Date of Birth: 08/31/1940

## 2020-08-11 ENCOUNTER — Other Ambulatory Visit: Payer: Self-pay

## 2020-08-11 ENCOUNTER — Ambulatory Visit (INDEPENDENT_AMBULATORY_CARE_PROVIDER_SITE_OTHER): Payer: Medicare Other | Admitting: Physical Therapy

## 2020-08-11 ENCOUNTER — Encounter: Payer: Self-pay | Admitting: Physical Therapy

## 2020-08-11 DIAGNOSIS — M25552 Pain in left hip: Secondary | ICD-10-CM

## 2020-08-11 DIAGNOSIS — M6281 Muscle weakness (generalized): Secondary | ICD-10-CM

## 2020-08-11 DIAGNOSIS — M79662 Pain in left lower leg: Secondary | ICD-10-CM | POA: Diagnosis not present

## 2020-08-11 DIAGNOSIS — R262 Difficulty in walking, not elsewhere classified: Secondary | ICD-10-CM

## 2020-08-11 NOTE — Therapy (Signed)
[facility] [place] [place], [place], [address] Phone: [phone]   Fax:  [phone]  Physical Therapy Treatment  Patient Details  Name: Oscar Sparks MRN: [id number] Date of Birth: July 25, 1940 Referring Provider (PT): Dr. Erlinda Hong   Encounter Date: 08/11/2020   PT End of Session - 08/11/20 9563    Visit Number 12    Number of Visits 20    Date for PT Re-Evaluation 09/10/20    Authorization Type Medicare, KX at 15    Progress Note Due on Visit 20    PT Start Time 0801    PT Stop Time 0841    PT Time Calculation (min) 40 min    Activity Tolerance Patient tolerated treatment well    Behavior During Therapy [facility] for tasks assessed/performed           Past Medical History:  Diagnosis Date  . Arthritis    knees  . Colon cancer ([place]) 09/24/2018  . ED (erectile dysfunction)   . 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 Peripheral vascular disease ([place])    poor curculation in hands and feet hard to monitor with pulse oximetry  . Prostate cancer [facility])    sees Dr. Gaynelle Arabian, received cesium seeds     Past Surgical History:  Procedure Laterality Date  . BACK SURGERY    . SPINE SURGERY  2004   Dr. Sherwood Gambler  . SUPERFICIAL PERONEAL NERVE RELEASE Left 06/23/2020   Procedure: left peroneal nerve neurolysis;  Surgeon: Leandrew Koyanagi, MD;  Location: [place];  Service: Orthopedics;  Laterality: Left;  . TONSILLECTOMY      There were no vitals filed for this visit.   Subjective Assessment - 08/11/20 0820    Subjective Pt relays not having pain, just continued complaints of ankle weakness    Limitations Walking    Patient Stated Goals Walk without the cane    Pain Onset More than a month ago           [facility] Adult PT Treatment/Exercise - 08/11/20 0001      Neuro Re-ed    Neuro Re-ed Details  fitter rocker board fwd/back 20 x each c moderate hand assist on bar, tandem ambulation fwd on foam in bars 10 ft x  12, and lateral walking on foam 51ft X12      Knee/Hip Exercises: Stretches   Press photographer Both;3 reps      Knee/Hip Exercises: Aerobic   Nustep Lvl 7 10 mins      Knee/Hip Exercises: Seated   Other Seated Knee/Hip Exercises red tband 4 way  20x Lt, then Eversion isometrics 5 sec X10    Sit to Sand without UE support;2 sets;10 reps      Ankle Exercises: Standing   Other Standing Ankle Exercises heel and toe raises 2X15                    PT Short Term Goals - 07/22/20 1804      PT SHORT TERM GOAL #1   Title Patient will demonstrate independent use of home exercise program to maintain progress from in clinic treatments.    Status Achieved             PT Long Term Goals - 08/04/20 0836      PT LONG TERM GOAL #1   Title Patient will demonstrate/report pain at worst less than or equal to 2/10 to facilitate minimal limitation in daily activity  secondary to pain symptoms.    Status Achieved      PT LONG TERM GOAL #2   Title Patient will demonstrate independent use of home exercise program to facilitate ability to maintain/progress functional gains from skilled physical therapy services.    Status On-going    Target Date 09/10/20      PT LONG TERM GOAL #3   Title Pt. will demonstrate FOTO outcome > 55 to indicated reduced disability due to condition.    Status Achieved    Target Date 09/10/20      PT LONG TERM GOAL #4   Title Pt. will demonstrate bilateral hip MMT > or = 4/5, ankle DF 5/5 bilateral to facilitate stability in ambulation, transfers independently.    Status On-going    Target Date 09/10/20      PT LONG TERM GOAL #5   Title Pt. will demonstrate BERG > 45 to indicate reduced fall risk.    Status Achieved    Target Date 09/10/20      PT LONG TERM GOAL #6   Title Pt. will demonstrate independent ambulation community distances.    Status On-going    Target Date 09/10/20                 Plan - 08/11/20 0841    Clinical Impression  Statement Contineud to focus on ankle strength, stabilitity, and proprioception with good overall tolerance and without complaints of increased pain. We continue to work to improve these deficits in PT and he shows great effort with PT and HEP.    Comorbidities 2020 Colon Cancer, Neuropathy in feet, PVD, prostate cancer.  Previously seen by clinic for Lt hip pain    Examination-Activity Limitations Squat;Bend;Stairs;Stand;Locomotion Level;Transfers    Examination-Participation Restrictions Community Activity;Shop;Yard Work    Product manager    Rehab Potential --   fair to good   PT Frequency 2x / week    PT Duration Other (comment)    PT Treatment/Interventions ADLs/Self Care Home Management;Electrical Stimulation;Iontophoresis 4mg /ml Dexamethasone;Moist Heat;Balance training;Therapeutic exercise;Therapeutic activities;Functional mobility training;Stair training;Gait training;DME Instruction;Ultrasound;Neuromuscular re-education;Patient/family education;Passive range of motion;Dry needling;Spinal Manipulations;Joint Manipulations;Taping;Manual techniques    PT Next Visit Plan ankle dorsiflexion, eversion strengthening, dynamic stability interventions, compliant and non compliant surface balance continued    PT Home Exercise Plan [id number]    Consulted and Agree with Plan of Care Patient           Patient will benefit from skilled therapeutic intervention in order to improve the following deficits and impairments:  Abnormal gait,Decreased endurance,Decreased activity tolerance,Pain,Decreased strength,Difficulty walking,Decreased mobility,Decreased balance,Decreased range of motion,Decreased coordination,Impaired perceived functional ability,Improper body mechanics  Visit Diagnosis: Pain in left lower leg  Muscle weakness (generalized)  Difficulty in walking, not elsewhere classified  Pain in left hip     Problem List Patient Active  Problem List   Diagnosis Date Noted  . 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 Pain in left hip 06/25/2014  . 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 Prostate cancer ([place]) 11/15/2009  . 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 CONTACT DERMATITIS 09/04/2007    Silvestre Mesi 08/11/2020, 8:42 AM  [facility] Physical Therapy [address] [place], [place], [address] Phone: [phone]   Fax:  [phone]  Name:  CHAI WEAGLE MRN: [id number] Date of Birth: October 08, 1940

## 2020-08-16 ENCOUNTER — Encounter: Payer: Self-pay | Admitting: Rehabilitative and Restorative Service Providers"

## 2020-08-16 ENCOUNTER — Other Ambulatory Visit: Payer: Self-pay

## 2020-08-16 ENCOUNTER — Ambulatory Visit (INDEPENDENT_AMBULATORY_CARE_PROVIDER_SITE_OTHER): Payer: Medicare Other | Admitting: Rehabilitative and Restorative Service Providers"

## 2020-08-16 DIAGNOSIS — R262 Difficulty in walking, not elsewhere classified: Secondary | ICD-10-CM | POA: Diagnosis not present

## 2020-08-16 DIAGNOSIS — M6281 Muscle weakness (generalized): Secondary | ICD-10-CM | POA: Diagnosis not present

## 2020-08-16 DIAGNOSIS — M79662 Pain in left lower leg: Secondary | ICD-10-CM | POA: Diagnosis not present

## 2020-08-16 NOTE — Therapy (Signed)
[place], [place], [address] Phone: [phone]   Fax:  [phone]  Physical Therapy Treatment  Patient Details  Name: Oscar Sparks MRN: [id number] Date of Birth: 1940-07-06 Referring Provider (PT): Dr. Erlinda Hong   Encounter Date: 08/16/2020   PT End of Session - 08/16/20 0803    Visit Number 13    Number of Visits 20    Date for PT Re-Evaluation 09/10/20    Authorization Type Medicare, KX at 15    Progress Note Due on Visit 20    PT Start Time 0758    PT Stop Time 0838    PT Time Calculation (min) 40 min    Activity Tolerance Patient tolerated treatment well    Behavior During Therapy [facility] for tasks assessed/performed           Past Medical History:  Diagnosis Date  . Arthritis    knees  . Colon cancer ([place]) 09/24/2018  . ED (erectile dysfunction)   . 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 Peripheral vascular disease ([place])    poor curculation in hands and feet hard to monitor with pulse oximetry  . Prostate cancer [facility])    sees Dr. Gaynelle Arabian, received cesium seeds     Past Surgical History:  Procedure Laterality Date  . BACK SURGERY    . SPINE SURGERY  2004   Dr. Sherwood Gambler  . SUPERFICIAL PERONEAL NERVE RELEASE Left 06/23/2020   Procedure: left peroneal nerve neurolysis;  Surgeon: Leandrew Koyanagi, MD;  Location: [place];  Service: Orthopedics;  Laterality: Left;  . TONSILLECTOMY      There were no vitals filed for this visit.   Subjective Assessment - 08/16/20 0802    Subjective Pt. indicated no pain, able to move foot out maybe a bit more.  Balance is slowly coming along.    Limitations Walking    Patient Stated Goals Walk without the cane    Currently in Pain? No/denies    Pain Score 0-No pain    Pain Onset More than a month ago              [facility] PT Assessment - 08/16/20 0001      Assessment   Medical Diagnosis Lt peroneal nerve decompression, foot drop     Referring Provider (PT) Dr. Erlinda Hong    Onset Date/Surgical Date 06/23/20    Hand Dominance Right      Single Leg Stance   Comments Lt SLS 5 seconds, Rt SLS 3 seconds      Ambulation/Gait   Gait Comments SPC in Rt UE, no AFO use into clinic, able to walk independently within clinic on level surfaces.                         [employer] Adult PT Treatment/Exercise - 08/16/20 0001      Neuro Re-ed    Neuro Re-ed Details  SLS c moderate hand assist corrections 15 sec x 6 bilateral, lateral stepping 3 cones x 6 bilateral c SBA occasional Min A to prevent loss of balance      Knee/Hip Exercises: Aerobic   Nustep Lvl 6 10 mins      Knee/Hip Exercises: Standing   Heel Raises --    Forward Step Up 2 sets;10 reps;Step Height: 4";Both      Knee/Hip Exercises: Seated   Sit to Sand 15 reps;without UE support   18 inch chair  Ankle Exercises: Standing   Other Standing Ankle Exercises heel raise x 20      Ankle Exercises: Seated   Other Seated Ankle Exercises red band eversion x 25 Lt      Ankle Exercises: Stretches   Slant Board Stretch 30 seconds;3 reps   bilateral                   PT Short Term Goals - 07/22/20 1804      PT SHORT TERM GOAL #1   Title Patient will demonstrate independent use of home exercise program to maintain progress from in clinic treatments.    Status Achieved             PT Long Term Goals - 08/04/20 0836      PT LONG TERM GOAL #1   Title Patient will demonstrate/report pain at worst less than or equal to 2/10 to facilitate minimal limitation in daily activity secondary to pain symptoms.    Status Achieved      PT LONG TERM GOAL #2   Title Patient will demonstrate independent use of home exercise program to facilitate ability to maintain/progress functional gains from skilled physical therapy services.    Status On-going    Target Date 09/10/20      PT LONG TERM GOAL #3   Title Pt. will demonstrate FOTO outcome > 55 to indicated  reduced disability due to condition.    Status Achieved    Target Date 09/10/20      PT LONG TERM GOAL #4   Title Pt. will demonstrate bilateral hip MMT > or = 4/5, ankle DF 5/5 bilateral to facilitate stability in ambulation, transfers independently.    Status On-going    Target Date 09/10/20      PT LONG TERM GOAL #5   Title Pt. will demonstrate BERG > 45 to indicate reduced fall risk.    Status Achieved    Target Date 09/10/20      PT LONG TERM GOAL #6   Title Pt. will demonstrate independent ambulation community distances.    Status On-going    Target Date 09/10/20                 Plan - 08/16/20 0828    Clinical Impression Statement Quality of eversion movement improving slightly in last visit or two.  Movement coordination in dynamic movement lacking with increased instability and postural sway noted due to fair ankle and hip strategy responses. May continue to benefit from skilled PT services.    Comorbidities 2020 Colon Cancer, Neuropathy in feet, PVD, prostate cancer.  Previously seen by clinic for Lt hip pain    Examination-Activity Limitations Squat;Bend;Stairs;Stand;Locomotion Level;Transfers    Examination-Participation Restrictions Community Activity;Shop;Yard Work    Product manager    Rehab Potential --   fair to good   PT Frequency 2x / week    PT Duration Other (comment)    PT Treatment/Interventions ADLs/Self Care Home Management;Electrical Stimulation;Iontophoresis 4mg /ml Dexamethasone;Moist Heat;Balance training;Therapeutic exercise;Therapeutic activities;Functional mobility training;Stair training;Gait training;DME Instruction;Ultrasound;Neuromuscular re-education;Patient/family education;Passive range of motion;Dry needling;Spinal Manipulations;Joint Manipulations;Taping;Manual techniques    PT Next Visit Plan ankle dorsiflexion, eversion strengthening, dynamic stability interventions, compliant and non  compliant surface balance continued to reduce fall risk    PT Home Exercise Plan [id number]    Consulted and Agree with Plan of Care Patient           Patient will benefit from skilled therapeutic intervention in order to improve the  following deficits and impairments:  Abnormal gait,Decreased endurance,Decreased activity tolerance,Pain,Decreased strength,Difficulty walking,Decreased mobility,Decreased balance,Decreased range of motion,Decreased coordination,Impaired perceived functional ability,Improper body mechanics  Visit Diagnosis: Pain in left lower leg  Muscle weakness (generalized)  Difficulty in walking, not elsewhere classified     Problem List Patient Active Problem List   Diagnosis Date Noted  . 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 Pain in left hip 06/25/2014  . 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 Prostate cancer ([place]) 11/15/2009  . 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 CONTACT DERMATITIS 09/04/2007    Scot Jun, PT, DPT, OCS, ATC 08/16/20  8:31 AM    [facility] [address] [place], [place], [address] Phone: [phone]   Fax:  [phone]  Name: Oscar Sparks MRN: [id number] Date of Birth: 1940-06-12

## 2020-08-18 ENCOUNTER — Encounter: Payer: Self-pay | Admitting: Physical Therapy

## 2020-08-18 ENCOUNTER — Ambulatory Visit (INDEPENDENT_AMBULATORY_CARE_PROVIDER_SITE_OTHER): Payer: Medicare Other | Admitting: Physical Therapy

## 2020-08-18 ENCOUNTER — Other Ambulatory Visit: Payer: Self-pay

## 2020-08-18 DIAGNOSIS — M79662 Pain in left lower leg: Secondary | ICD-10-CM

## 2020-08-18 DIAGNOSIS — R262 Difficulty in walking, not elsewhere classified: Secondary | ICD-10-CM | POA: Diagnosis not present

## 2020-08-18 DIAGNOSIS — M6281 Muscle weakness (generalized): Secondary | ICD-10-CM | POA: Diagnosis not present

## 2020-08-18 DIAGNOSIS — M25552 Pain in left hip: Secondary | ICD-10-CM

## 2020-08-18 NOTE — Therapy (Addendum)
[place], [place], [address] Phone: [phone]   Fax:  [phone]  Physical Therapy Treatment  Patient Details  Name: Oscar Sparks MRN: [id number] Date of Birth: 08-02-1940 Referring Provider (PT): Dr. Erlinda Hong  PHYSICAL THERAPY DISCHARGE SUMMARY  Visits from Start of Care: 14  Current functional level related to goals / functional outcomes: See note   Remaining deficits: See note   Education / Equipment: HEP   Patient agrees to discharge. Patient goals were met. Patient is being discharged due to meeting the stated rehab goals.  Encounter Date: 08/18/2020   PT End of Session - 08/18/20 0810     Visit Number 14    Number of Visits 20    Date for PT Re-Evaluation 09/10/20    Authorization Type Medicare, KX at 15    Progress Note Due on Visit 20    PT Start Time 0800    PT Stop Time 0840    PT Time Calculation (min) 40 min    Activity Tolerance Patient tolerated treatment well    Behavior During Therapy [facility] for tasks assessed/performed             Past Medical History:  Diagnosis Date   Arthritis    knees   Colon cancer ([place]) 09/24/2018   ED (erectile dysfunction)    Neuromuscular disorder ([facility])    neuropathy in feet   Peripheral vascular disease ([place])    poor curculation in hands and feet hard to monitor with pulse oximetry   Prostate cancer [facility])    sees Dr. Gaynelle Arabian, received cesium seeds     Past Surgical History:  Procedure Laterality Date   [place]  2004   Dr. Sherwood Gambler   SUPERFICIAL PERONEAL NERVE RELEASE Left 06/23/2020   Procedure: left peroneal nerve neurolysis;  Surgeon: Leandrew Koyanagi, MD;  Location: [place];  Service: Orthopedics;  Laterality: Left;   TONSILLECTOMY      There were no vitals filed for this visit.   Subjective Assessment - 08/18/20 0809     Subjective Pt. indicated no pain, he will finish up with PT today and work on  things at home with HEP.    Limitations Walking    Patient Stated Goals Walk without the cane    Pain Onset More than a month ago                [facility] PT Assessment - 08/18/20 0001       Assessment   Medical Diagnosis Lt peroneal nerve decompression, foot drop    Referring Provider (PT) Dr. Erlinda Hong    Onset Date/Surgical Date 06/23/20    Hand Dominance Right      Strength   Overall Strength Comments ankle strenth tested in supien    Left Ankle Dorsiflexion 4+/5    Left Ankle Plantar Flexion 4/5    Left Ankle Inversion 5/5    Left Ankle Eversion 3+/5      Berg Balance Test   Sit to Stand Able to stand without using hands and stabilize independently    Standing Unsupported Able to stand safely 2 minutes    Sitting with Back Unsupported but Feet Supported on Floor or Stool Able to sit safely and securely 2 minutes    Stand to Sit Sits safely with minimal use of hands    Transfers Able to transfer safely, minor use of hands    Standing Unsupported with Eyes  Closed Able to stand 10 seconds safely    Standing Unsupported with Feet Together Able to place feet together independently and stand 1 minute safely    From Standing, Reach Forward with Outstretched Arm Can reach forward >12 cm safely (5")    From Standing Position, Pick up Object from [place] to pick up shoe safely and easily    From Standing Position, Turn to Look Behind Over each Shoulder Looks behind from both sides and weight shifts well    Turn 360 Degrees Able to turn 360 degrees safely one side only in 4 seconds or less    Standing Unsupported, Alternately Place Feet on Step/Stool Able to stand independently and safely and complete 8 steps in 20 seconds    Standing Unsupported, One Foot in [place] to plae foot ahead of the other independently and hold 30 seconds    Standing on One Leg Able to lift leg independently and hold equal to or more than 3 seconds    Total Score 51                            [facility] Adult PT Treatment/Exercise - 08/18/20 0001       Ambulation/Gait   Gait Comments ambulates to and within clinic no AD today      Knee/Hip Exercises: Stretches   Risk manager Limitations slantboard      Knee/Hip Exercises: Aerobic   Recumbent Bike L5 X10 min      Knee/Hip Exercises: Standing   Forward Step Up 2 sets;10 reps;Step Height: 4";Both      Knee/Hip Exercises: Seated   Sit to Sand 2 sets;10 reps;without UE support      Knee/Hip Exercises: Supine   Other Supine Knee/Hip Exercises ankle 4way 3x10, 3# for ankle alphabet X1 and cirlces X20                      PT Short Term Goals - 07/22/20 1804       PT SHORT TERM GOAL #1   Title Patient will demonstrate independent use of home exercise program to maintain progress from in clinic treatments.    Status Achieved               PT Long Term Goals - 08/18/20 1448       PT LONG TERM GOAL #1   Title Patient will demonstrate/report pain at worst less than or equal to 2/10 to facilitate minimal limitation in daily activity secondary to pain symptoms.    Status Achieved      PT LONG TERM GOAL #2   Title Patient will demonstrate independent use of home exercise program to facilitate ability to maintain/progress functional gains from skilled physical therapy services.    Status Achieved      PT LONG TERM GOAL #3   Title Pt. will demonstrate FOTO outcome > 55 to indicated reduced disability due to condition.    Status Achieved      PT LONG TERM GOAL #4   Title Pt. will demonstrate bilateral hip MMT > or = 4/5, ankle DF 5/5 bilateral to facilitate stability in ambulation, transfers independently.    Baseline hip grossly 4/5, lacking ankle EV strength    Status Partially Met      PT LONG TERM GOAL #5   Title Pt. will demonstrate BERG > 45 to indicate reduced fall risk.  Baseline 51 on 08/18/20    Status Achieved      PT LONG TERM  GOAL #6   Title Pt. will demonstrate independent ambulation community distances.    Status Achieved                   Plan - 08/18/20 3845     Clinical Impression Statement He would like to place PT on hold as he feels confident he can continue to progress with HEP. We will leave his episode of care open for him to trial for 1-2 months.    Comorbidities 2020 Colon Cancer, Neuropathy in feet, PVD, prostate cancer.  Previously seen by clinic for Lt hip pain    Examination-Activity Limitations Squat;Bend;Stairs;Stand;Locomotion Level;Transfers    Examination-Participation Restrictions Community Activity;Shop;Yard Work    Product manager    Rehab Potential --   fair to good   PT Frequency 2x / week    PT Duration Other (comment)    PT Treatment/Interventions ADLs/Self Care Home Management;Electrical Stimulation;Iontophoresis 57m/ml Dexamethasone;Moist Heat;Balance training;Therapeutic exercise;Therapeutic activities;Functional mobility training;Stair training;Gait training;DME Instruction;Ultrasound;Neuromuscular re-education;Patient/family education;Passive range of motion;Dry needling;Spinal Manipulations;Joint Manipulations;Taping;Manual techniques    PT Next Visit Plan hold PT for 1-2 month HEP trial period    PT Home Exercise Plan [id number]    Consulted and Agree with Plan of Care Patient             Patient will benefit from skilled therapeutic intervention in order to improve the following deficits and impairments:  Abnormal gait,Decreased endurance,Decreased activity tolerance,Pain,Decreased strength,Difficulty walking,Decreased mobility,Decreased balance,Decreased range of motion,Decreased coordination,Impaired perceived functional ability,Improper body mechanics  Visit Diagnosis: Pain in left lower leg  Muscle weakness (generalized)  Difficulty in walking, not elsewhere classified  Pain in left hip     Problem  List Patient Active Problem List   Diagnosis Date Noted   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   Cancer of ascending colon s/p robotic colectomy 11/20/2018 09/30/2018   Iron deficiency anemia 10/16/2016   Pain in left hip 06/25/2014   Piriformis syndrome of left side 06/25/2014   [place] 11/15/2009   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

## 2020-08-19 ENCOUNTER — Ambulatory Visit (INDEPENDENT_AMBULATORY_CARE_PROVIDER_SITE_OTHER): Payer: Medicare Other | Admitting: Family Medicine

## 2020-08-19 ENCOUNTER — Encounter: Payer: Self-pay | Admitting: Family Medicine

## 2020-08-19 VITALS — BP 118/68 | HR 64 | Temp 97.4°F | Wt 144.2 lb

## 2020-08-19 DIAGNOSIS — K4090 Unilateral inguinal hernia, without obstruction or gangrene, not specified as recurrent: Secondary | ICD-10-CM | POA: Diagnosis not present

## 2020-08-19 NOTE — Progress Notes (Signed)
   Subjective:    Patient ID: Oscar Sparks, male    DOB: May 18, 1940, 80 y.o.   MRN: [id number]  HPI Here for a lump in the right groin that appeared about 6 weeks ago. It is mildly painful at times. No trouble with urination or BMs. No hx of trauma.    Review of Systems  Constitutional: Negative.   Respiratory: Negative.   Cardiovascular: Negative.        Objective:   Physical Exam Constitutional:      General: He is not in acute distress.    Appearance: Normal appearance.  Cardiovascular:     Rate and Rhythm: Normal rate and regular rhythm.     Pulses: Normal pulses.     Heart sounds: Normal heart sounds.  Pulmonary:     Effort: Pulmonary effort is normal.     Breath sounds: Normal breath sounds.  Abdominal:     General: Abdomen is flat. Bowel sounds are normal. There is no distension.     Palpations: Abdomen is soft.     Tenderness: There is no guarding or rebound.     Comments: There is a small slightly tender easily reducible right inguinal hernia   Neurological:     Mental Status: He is alert.           Assessment & Plan:  Inguinal hernia. He will avoid heavy lifting. Refer to Surgery to evaluate.  Alysia Penna, MD

## 2020-09-08 ENCOUNTER — Telehealth: Payer: Self-pay | Admitting: Family Medicine

## 2020-09-08 ENCOUNTER — Ambulatory Visit: Payer: Medicare Other | Admitting: Orthopaedic Surgery

## 2020-09-08 NOTE — Telephone Encounter (Signed)
Left message for patient to call back and schedule Medicare Annual Wellness Visit (AWV) either virtually or in office.   awvi 03/27/17 per [facility]  please schedule at anytime with [facility] Nurse Health Advisor 1 or 2   This should be a 45 minute visit.

## 2020-09-28 ENCOUNTER — Other Ambulatory Visit: Payer: Self-pay

## 2020-09-28 ENCOUNTER — Ambulatory Visit (INDEPENDENT_AMBULATORY_CARE_PROVIDER_SITE_OTHER): Payer: Medicare Other | Admitting: Physical Therapy

## 2020-09-28 DIAGNOSIS — M6281 Muscle weakness (generalized): Secondary | ICD-10-CM | POA: Diagnosis not present

## 2020-09-28 DIAGNOSIS — M25552 Pain in left hip: Secondary | ICD-10-CM

## 2020-09-28 DIAGNOSIS — M79662 Pain in left lower leg: Secondary | ICD-10-CM

## 2020-09-28 NOTE — Therapy (Signed)
[facility] Physical Therapy [address] [place], [place], [address] Phone: [phone]   Fax:  [phone]  Physical Therapy Treatment/Progress note/Recert Progress Note reporting period date 08/18/20 to 09/28/20  See below for objective and subjective measurements relating to patients progress with PT.   Patient Details  Name: Oscar Sparks MRN: [id number] Date of Birth: 1940/06/23 Referring Provider (PT): Dr. Erlinda Hong   Encounter Date: 09/28/2020   PT End of Session - 09/28/20 0929     Visit Number 15    Number of Visits 26    Date for PT Re-Evaluation 11/23/20    Authorization Type Medicare, KX at 15    Progress Note Due on Visit 25    PT Start Time 0849    PT Stop Time 0932    PT Time Calculation (min) 43 min    Activity Tolerance Patient tolerated treatment well    Behavior During Therapy [facility] for tasks assessed/performed             Past Medical History:  Diagnosis Date   Arthritis    knees   Colon cancer ([place]) 09/24/2018   ED (erectile dysfunction)    Neuromuscular disorder ([place])    neuropathy in feet   Peripheral vascular disease ([place])    poor curculation in hands and feet hard to monitor with pulse oximetry   Prostate cancer [facility])    sees Dr. Gaynelle Arabian, received cesium seeds     Past Surgical History:  Procedure Laterality Date   [place]  2004   Dr. Sherwood Gambler   SUPERFICIAL PERONEAL NERVE RELEASE Left 06/23/2020   Procedure: left peroneal nerve neurolysis;  Surgeon: Leandrew Koyanagi, MD;  Location: [place];  Service: Orthopedics;  Laterality: Left;   TONSILLECTOMY      There were no vitals filed for this visit.   Subjective Assessment - 09/28/20 0900     Subjective Pt. 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 He still has to use [facility] most of the time.    Limitations Walking    Patient Stated Goals Walk without the cane    Currently in  Pain? No/denies    Pain Onset More than a month ago                [facility] PT Assessment - 09/28/20 0001       Assessment   Medical Diagnosis Lt peroneal nerve decompression, foot drop    Referring Provider (PT) Dr. Erlinda Hong    Onset Date/Surgical Date 06/23/20    Hand Dominance Right      Single Leg Stance   Comments Lt 3 second avg, Rt 5 sec avg      AROM   Right Ankle Dorsiflexion 5    Right Ankle Plantar Flexion 35    Right Ankle Inversion --   WNL   Right Ankle Eversion 5      Strength   Overall Strength Comments ankle strength tested in supine    Left Ankle Dorsiflexion 4+/5    Left Ankle Plantar Flexion 4+/5    Left Ankle Inversion 4/5    Left Ankle Eversion 3+/5      Ambulation/Gait   Gait Comments ambulates to and within clinic no AD today, but he does relay he needs SPC the rest of the time due to foot drop and gait instability. He but does still show foot drop on  left and trendelenberg.                           [facility] Adult PT Treatment/Exercise - 09/28/20 0001       Neuro Re-ed    Neuro Re-ed Details  SLS X 5 on both, 3 sec avg on Lt, 5 sec avg on Rt, tandem balance on foam 30 sec X2 bilat      Knee/Hip Exercises: Aerobic   Nustep Lvl 6 10 mins      Knee/Hip Exercises: Supine   Other Supine Knee/Hip Exercises ankle Inv and EV red X20, ankle DF and PF green X20    Other Supine Knee/Hip Exercises alphabet X1 with 3#, cirlces X20 CW,CCW X20 ea 3#,      Knee/Hip Exercises: Sidelying   Other Sidelying Knee/Hip Exercises on his Rt side moving into Lt EV X20      Ankle Exercises: Standing   Other Standing Ankle Exercises heel and toe raises X20                      PT Short Term Goals - 09/28/20 0941       PT SHORT TERM GOAL #1   Title Patient will demonstrate independent use of home exercise program to maintain progress from in clinic treatments.    Baseline for current HEP    Status Achieved      PT SHORT TERM GOAL #2    Title he will hold SLS on Lt leg for 5 sec avg    Time 4    Period Weeks    Status New    Target Date 10/26/20               PT Long Term Goals - 09/28/20 0942       PT LONG TERM GOAL #1   Title Patient will demonstrate/report pain at worst less than or equal to 2/10 to facilitate minimal limitation in daily activity secondary to pain symptoms.    Status Achieved      PT LONG TERM GOAL #2   Title Patient will demonstrate independent use of home exercise program to facilitate ability to maintain/progress functional gains from skilled physical therapy services.    Baseline for current HEP, will need to be updated by end of PT    Status Partially Met      PT LONG TERM GOAL #3   Title Pt. will demonstrate FOTO outcome > 55 to indicated reduced disability due to condition.    Status Achieved      PT LONG TERM GOAL #4   Title Pt. will demonstrate bilateral hip MMT > or = 4/5, ankle DF 5/5 bilateral to facilitate stability in ambulation, transfers independently.    Baseline hip grossly 4/5, lacking ankle EV strength    Status Partially Met      PT LONG TERM GOAL #5   Title Pt. will demonstrate BERG > 45 to indicate reduced fall risk.    Baseline 51 on 08/18/20    Status Achieved      Additional Long Term Goals   Additional Long Term Goals Yes      PT LONG TERM GOAL #6   Title Pt. will demonstrate independent ambulation community distances.    Status Achieved      PT LONG TERM GOAL #7   Title He will be able to hold SLS on left for >8 seconds    Time 8  Period Weeks    Status New    Target Date 11/23/20                   Plan - 09/28/20 1157     Clinical Impression Statement He took a break from PT, was last seen on 08/18/20. He has overall improved with strength in DF,PF,and INV with HEP but continues to have mild deficits in these areas and severe deficit in Eversion strength. He also continues to have deficits with gait and balance. PT would be medically  necessary to improve these functional deficits and reduce his risk of falling. Pt recommending 12 more visits over next 8 weeks.    Comorbidities 2020 Colon Cancer, Neuropathy in feet, PVD, prostate cancer.  Previously seen by clinic for Lt hip pain    Examination-Activity Limitations Squat;Bend;Stairs;Stand;Locomotion Level;Transfers    Examination-Participation Restrictions Community Activity;Shop;Yard Work    Product manager    Rehab Potential --   fair to good   PT Frequency 2x / week    PT Duration Other (comment)    PT Treatment/Interventions ADLs/Self Care Home Management;Electrical Stimulation;Iontophoresis 10m/ml Dexamethasone;Moist Heat;Balance training;Therapeutic exercise;Therapeutic activities;Functional mobility training;Stair training;Gait training;DME Instruction;Ultrasound;Neuromuscular re-education;Patient/family education;Passive range of motion;Dry needling;Spinal Manipulations;Joint Manipulations;Taping;Manual techniques    PT Next Visit Plan focus on Lt ankle strength, gait and balance    PT Home Exercise Plan [id number]    Consulted and Agree with Plan of Care Patient             Patient will benefit from skilled therapeutic intervention in order to improve the following deficits and impairments:  Abnormal gait, Decreased endurance, Decreased activity tolerance, Pain, Decreased strength, Difficulty walking, Decreased mobility, Decreased balance, Decreased range of motion, Decreased coordination, Impaired perceived functional ability, Improper body mechanics  Visit Diagnosis: Pain in left lower leg  Muscle weakness (generalized)  Pain in left hip     Problem List Patient Active Problem List   Diagnosis Date Noted   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

## 2020-09-29 DIAGNOSIS — Z20822 Contact with and (suspected) exposure to covid-19: Secondary | ICD-10-CM | POA: Diagnosis not present

## 2020-10-05 ENCOUNTER — Encounter: Payer: Self-pay | Admitting: Physical Therapy

## 2020-10-05 ENCOUNTER — Other Ambulatory Visit: Payer: Self-pay

## 2020-10-05 ENCOUNTER — Ambulatory Visit (INDEPENDENT_AMBULATORY_CARE_PROVIDER_SITE_OTHER): Payer: Medicare Other | Admitting: Physical Therapy

## 2020-10-05 DIAGNOSIS — M6281 Muscle weakness (generalized): Secondary | ICD-10-CM

## 2020-10-05 DIAGNOSIS — R262 Difficulty in walking, not elsewhere classified: Secondary | ICD-10-CM | POA: Diagnosis not present

## 2020-10-05 DIAGNOSIS — M25552 Pain in left hip: Secondary | ICD-10-CM

## 2020-10-05 DIAGNOSIS — M79662 Pain in left lower leg: Secondary | ICD-10-CM

## 2020-10-05 NOTE — Therapy (Signed)
[facility] Physical Therapy [address] [place], [place], [address] Phone: [phone]   Fax:  [phone]  Physical Therapy Treatment  Patient Details  Name: Oscar Sparks MRN: [id number] Date of Birth: 03/28/1940 Referring Provider (PT): Dr. Erlinda Hong   Encounter Date: 10/05/2020   PT End of Session - 10/05/20 0941     Visit Number 16    Number of Visits 26    Date for PT Re-Evaluation 11/23/20    Authorization Type Medicare, KX at 15    Progress Note Due on Visit 25    PT Start Time 0845    PT Stop Time 0924    PT Time Calculation (min) 39 min    Activity Tolerance Patient tolerated treatment well    Behavior During Therapy [facility] for tasks assessed/performed             Past Medical History:  Diagnosis Date   Arthritis    knees   Colon cancer ([place]) 09/24/2018   ED (erectile dysfunction)    Neuromuscular disorder ([place])    neuropathy in feet   Peripheral vascular disease ([place])    poor curculation in hands and feet hard to monitor with pulse oximetry   Prostate cancer [facility])    sees Dr. Gaynelle Arabian, received cesium seeds     Past Surgical History:  Procedure Laterality Date   [place]  2004   Dr. Sherwood Gambler   SUPERFICIAL PERONEAL NERVE RELEASE Left 06/23/2020   Procedure: left peroneal nerve neurolysis;  Surgeon: Leandrew Koyanagi, MD;  Location: [place];  Service: Orthopedics;  Laterality: Left;   TONSILLECTOMY      There were no vitals filed for this visit.   Subjective Assessment - 10/05/20 0930     Subjective Pt. indicated no pain and he feels good but has not noticed any improvement with his overall ability for Lt ankle eversion and would like to focus on this.    Limitations Walking    Patient Stated Goals Walk without the cane    Pain Onset More than a month ago              [facility] Adult PT Treatment/Exercise - 10/05/20 0001       Neuro Re-ed    Neuro Re-ed Details  tandem walk 3 round trips,  grapevine walk 3 round trips, SLS 10 sec X5      Knee/Hip Exercises: Stretches   Risk manager Limitations slantboard      Knee/Hip Exercises: Aerobic   Nustep Lvl 6 10 mins      Knee/Hip Exercises: Standing   Heel Raises Limitations heel and toe raises 3X10    Other Standing Knee Exercises lt ankle EV isometric at wall with ball 2X10      Knee/Hip Exercises: Sidelying   Other Sidelying Knee/Hip Exercises on his Rt side moving into Lt EV 3X10      Ankle Exercises: Seated   ABC's 2 reps   2#                     PT Short Term Goals - 09/28/20 0941       PT SHORT TERM GOAL #1   Title Patient will demonstrate independent use of home exercise program to maintain progress from in clinic treatments.    Baseline for current HEP    Status Achieved      PT SHORT TERM GOAL #2  Title he will hold SLS on Lt leg for 5 sec avg    Time 4    Period Weeks    Status New    Target Date 10/26/20               PT Long Term Goals - 09/28/20 0942       PT LONG TERM GOAL #1   Title Patient will demonstrate/report pain at worst less than or equal to 2/10 to facilitate minimal limitation in daily activity secondary to pain symptoms.    Status Achieved      PT LONG TERM GOAL #2   Title Patient will demonstrate independent use of home exercise program to facilitate ability to maintain/progress functional gains from skilled physical therapy services.    Baseline for current HEP, will need to be updated by end of PT    Status Partially Met      PT LONG TERM GOAL #3   Title Pt. will demonstrate FOTO outcome > 55 to indicated reduced disability due to condition.    Status Achieved      PT LONG TERM GOAL #4   Title Pt. will demonstrate bilateral hip MMT > or = 4/5, ankle DF 5/5 bilateral to facilitate stability in ambulation, transfers independently.    Baseline hip grossly 4/5, lacking ankle EV strength    Status Partially Met      PT LONG  TERM GOAL #5   Title Pt. will demonstrate BERG > 45 to indicate reduced fall risk.    Baseline 51 on 08/18/20    Status Achieved      Additional Long Term Goals   Additional Long Term Goals Yes      PT LONG TERM GOAL #6   Title Pt. will demonstrate independent ambulation community distances.    Status Achieved      PT LONG TERM GOAL #7   Title He will be able to hold SLS on left for >8 seconds    Time 8    Period Weeks    Status New    Target Date 11/23/20                   Plan - 10/05/20 0941     Clinical Impression Statement Session focused on Lt ankle eversion activation and stabilization with overall balance work. I provided him with additional print out for the ankle focused exercises and he showed good understanding of these.    Comorbidities 2020 Colon Cancer, Neuropathy in feet, PVD, prostate cancer.  Previously seen by clinic for Lt hip pain    Examination-Activity Limitations Squat;Bend;Stairs;Stand;Locomotion Level;Transfers    Examination-Participation Restrictions Community Activity;Shop;Yard Work    Product manager    Rehab Potential --   fair to good   PT Frequency 2x / week    PT Duration Other (comment)    PT Treatment/Interventions ADLs/Self Care Home Management;Electrical Stimulation;Iontophoresis 87m/ml Dexamethasone;Moist Heat;Balance training;Therapeutic exercise;Therapeutic activities;Functional mobility training;Stair training;Gait training;DME Instruction;Ultrasound;Neuromuscular re-education;Patient/family education;Passive range of motion;Dry needling;Spinal Manipulations;Joint Manipulations;Taping;Manual techniques    PT Next Visit Plan focus on Lt ankle strength, gait and balance    PT Home Exercise Plan [id number]    Consulted and Agree with Plan of Care Patient             Patient will benefit from skilled therapeutic intervention in order to improve the following deficits and impairments:   Abnormal gait, Decreased endurance, Decreased activity tolerance, Pain, Decreased strength, Difficulty walking, Decreased mobility, Decreased balance,  Decreased range of motion, Decreased coordination, Impaired perceived functional ability, Improper body mechanics  Visit Diagnosis: Pain in left lower leg  Muscle weakness (generalized)  Pain in left hip  Difficulty in walking, not elsewhere classified     Problem List Patient Active Problem List   Diagnosis Date Noted   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 [place], [place], [address] Phone: [phone]   Fax:  [phone]  Name: Oscar Sparks MRN: [id number] Date of Birth: 1940-08-29

## 2020-10-07 ENCOUNTER — Other Ambulatory Visit: Payer: Self-pay

## 2020-10-07 ENCOUNTER — Ambulatory Visit (INDEPENDENT_AMBULATORY_CARE_PROVIDER_SITE_OTHER): Payer: Medicare Other | Admitting: Physical Therapy

## 2020-10-07 DIAGNOSIS — M6281 Muscle weakness (generalized): Secondary | ICD-10-CM | POA: Diagnosis not present

## 2020-10-07 DIAGNOSIS — M79662 Pain in left lower leg: Secondary | ICD-10-CM

## 2020-10-07 DIAGNOSIS — R262 Difficulty in walking, not elsewhere classified: Secondary | ICD-10-CM | POA: Diagnosis not present

## 2020-10-07 DIAGNOSIS — M25552 Pain in left hip: Secondary | ICD-10-CM

## 2020-10-07 NOTE — Therapy (Signed)
[facility] Physical Therapy [address] [place], [place], [address] Phone: [phone]   Fax:  [phone]  Physical Therapy Treatment  Patient Details  Name: Oscar Sparks MRN: [id number] Date of Birth: 1940/11/07 Referring Provider (PT): Dr. Erlinda Hong   Encounter Date: 10/07/2020   PT End of Session - 10/07/20 0923     Visit Number 17    Number of Visits 26    Date for PT Re-Evaluation 11/23/20    Authorization Type Medicare, KX at 15    Progress Note Due on Visit 25    PT Start Time 0842    PT Stop Time 0930    PT Time Calculation (min) 48 min    Activity Tolerance Patient tolerated treatment well    Behavior During Therapy [facility] for tasks assessed/performed             Past Medical History:  Diagnosis Date   Arthritis    knees   Colon cancer ([place]) 09/24/2018   ED (erectile dysfunction)    Neuromuscular disorder ([place])    neuropathy in feet   Peripheral vascular disease ([place])    poor curculation in hands and feet hard to monitor with pulse oximetry   Prostate cancer [facility])    sees Dr. Gaynelle Arabian, received cesium seeds     Past Surgical History:  Procedure Laterality Date   [place]  2004   Dr. [place] Gambler   SUPERFICIAL PERONEAL NERVE RELEASE Left 06/23/2020   Procedure: left peroneal nerve neurolysis;  Surgeon: Leandrew Koyanagi, MD;  Location: [place];  Service: Orthopedics;  Laterality: Left;   TONSILLECTOMY      There were no vitals filed for this visit.   Subjective Assessment - 10/07/20 0904     Subjective Pt. indicated no pain today and he has not had any falls since starting back PT.    Limitations Walking    Patient Stated Goals Walk without the cane    Pain Onset More than a month ago                               [facility] Adult PT Treatment/Exercise - 10/07/20 0001       Neuro Re-ed    Neuro Re-ed Details  tandem walk and sidestepping on foam beam 4 round trips       Knee/Hip Exercises: Machines for Strengthening   Total Gym Leg Press 81# DL 2X15, then SL 37# 2X10      Ankle Exercises: Aerobic   Recumbent Bike L5 X 10 min      Ankle Exercises: Standing   BAPS Level 3   2.5 lbs, 20 reps ea PF,EV,cirlces   Other Standing Ankle Exercises heel and toe raises 3X10, standing ankle eversion isometric pushing into ball at wall 5 sec X 15                      PT Short Term Goals - 09/28/20 0941       PT SHORT TERM GOAL #1   Title Patient will demonstrate independent use of home exercise program to maintain progress from in clinic treatments.    Baseline for current HEP    Status Achieved      PT SHORT TERM GOAL #2   Title he will hold SLS on Lt leg for 5 sec avg    Time 4    Period Weeks  Status New    Target Date 10/26/20               PT Long Term Goals - 09/28/20 0942       PT LONG TERM GOAL #1   Title Patient will demonstrate/report pain at worst less than or equal to 2/10 to facilitate minimal limitation in daily activity secondary to pain symptoms.    Status Achieved      PT LONG TERM GOAL #2   Title Patient will demonstrate independent use of home exercise program to facilitate ability to maintain/progress functional gains from skilled physical therapy services.    Baseline for current HEP, will need to be updated by end of PT    Status Partially Met      PT LONG TERM GOAL #3   Title Pt. will demonstrate FOTO outcome > 55 to indicated reduced disability due to condition.    Status Achieved      PT LONG TERM GOAL #4   Title Pt. will demonstrate bilateral hip MMT > or = 4/5, ankle DF 5/5 bilateral to facilitate stability in ambulation, transfers independently.    Baseline hip grossly 4/5, lacking ankle EV strength    Status Partially Met      PT LONG TERM GOAL #5   Title Pt. will demonstrate BERG > 45 to indicate reduced fall risk.    Baseline 51 on 08/18/20    Status Achieved      Additional Long Term Goals    Additional Long Term Goals Yes      PT LONG TERM GOAL #6   Title Pt. will demonstrate independent ambulation community distances.    Status Achieved      PT LONG TERM GOAL #7   Title He will be able to hold SLS on left for >8 seconds    Time 8    Period Weeks    Status New    Target Date 11/23/20                   Plan - 10/07/20 0947     Clinical Impression Statement Added BAPS board today with 2.5# for resisted EV movements. We can likely add more weight to this next time as he did well with this. Also had him on foam surface today to further challenge balance and ankle stability. Continue POC    Comorbidities 2020 Colon Cancer, Neuropathy in feet, PVD, prostate cancer.  Previously seen by clinic for Lt hip pain    Examination-Activity Limitations Squat;Bend;Stairs;Stand;Locomotion Level;Transfers    Examination-Participation Restrictions Community Activity;Shop;Yard Work    Product manager    Rehab Potential --   fair to good   PT Frequency 2x / week    PT Duration Other (comment)    PT Treatment/Interventions ADLs/Self Care Home Management;Electrical Stimulation;Iontophoresis 76m/ml Dexamethasone;Moist Heat;Balance training;Therapeutic exercise;Therapeutic activities;Functional mobility training;Stair training;Gait training;DME Instruction;Ultrasound;Neuromuscular re-education;Patient/family education;Passive range of motion;Dry needling;Spinal Manipulations;Joint Manipulations;Taping;Manual techniques    PT Next Visit Plan focus on Lt ankle strength, gait and balance    PT Home Exercise Plan [id number]    Consulted and Agree with Plan of Care Patient             Patient will benefit from skilled therapeutic intervention in order to improve the following deficits and impairments:  Abnormal gait, Decreased endurance, Decreased activity tolerance, Pain, Decreased strength, Difficulty walking, Decreased mobility, Decreased  balance, Decreased range of motion, Decreased coordination, Impaired perceived functional ability, Improper body mechanics  Visit  Diagnosis: Pain in left lower leg  Muscle weakness (generalized)  Pain in left hip  Difficulty in walking, not elsewhere classified     Problem List Patient Active Problem List   Diagnosis Date Noted   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 [place], [place], [address] Phone: [phone]   Fax:  [phone]  Name: Oscar Sparks MRN: [id number] Date of Birth: September 29, 1940

## 2020-10-11 ENCOUNTER — Telehealth: Payer: Self-pay | Admitting: Family Medicine

## 2020-10-11 NOTE — Telephone Encounter (Signed)
Correction  awvi 03/27/17 per palmetto

## 2020-10-11 NOTE — Telephone Encounter (Signed)
Left message for patient to call back and schedule Medicare Annual Wellness Visit (AWV) either virtually or in office.   AWV-I PER PALMETTO 10/26/19  please schedule at anytime with [facility] Nurse Health Advisor 1 or 2   This should be a 45 minute visit.

## 2020-10-12 ENCOUNTER — Other Ambulatory Visit: Payer: Self-pay

## 2020-10-12 ENCOUNTER — Ambulatory Visit (INDEPENDENT_AMBULATORY_CARE_PROVIDER_SITE_OTHER): Payer: Medicare Other | Admitting: Physical Therapy

## 2020-10-12 DIAGNOSIS — R262 Difficulty in walking, not elsewhere classified: Secondary | ICD-10-CM | POA: Diagnosis not present

## 2020-10-12 DIAGNOSIS — M6281 Muscle weakness (generalized): Secondary | ICD-10-CM | POA: Diagnosis not present

## 2020-10-12 DIAGNOSIS — M79662 Pain in left lower leg: Secondary | ICD-10-CM | POA: Diagnosis not present

## 2020-10-12 DIAGNOSIS — M25552 Pain in left hip: Secondary | ICD-10-CM

## 2020-10-12 NOTE — Therapy (Signed)
[facility] Physical Therapy [address] [place], [place], [address] Phone: [phone]   Fax:  [phone]  Physical Therapy Treatment  Patient Details  Name: Oscar Sparks MRN: [id number] Date of Birth: [birth year] Referring Provider (PT): Dr. Erlinda Hong   Encounter Date: 10/12/2020   PT End of Session - 10/12/20 0921     Visit Number 18    Number of Visits 26    Date for PT Re-Evaluation 11/23/20    Authorization Type Medicare, KX at 15    Progress Note Due on Visit 25    PT Start Time 0850    PT Stop Time 0930    PT Time Calculation (min) 40 min    Activity Tolerance Patient tolerated treatment well    Behavior During Therapy [facility] for tasks assessed/performed             Past Medical History:  Diagnosis Date   Arthritis    knees   Colon cancer ([place]) 09/24/2018   ED (erectile dysfunction)    Neuromuscular disorder ([place])    neuropathy in feet   Peripheral vascular disease ([place])    poor curculation in hands and feet hard to monitor with pulse oximetry   Prostate cancer [facility])    sees Dr. Gaynelle Arabian, received cesium seeds     Past Surgical History:  Procedure Laterality Date   [place]  2004   Dr. Sherwood Gambler   SUPERFICIAL PERONEAL NERVE RELEASE Left 06/23/2020   Procedure: left peroneal nerve neurolysis;  Surgeon: Leandrew Koyanagi, MD;  Location: [place];  Service: Orthopedics;  Laterality: Left;   TONSILLECTOMY      There were no vitals filed for this visit.   Subjective Assessment - 10/12/20 0903     Subjective denies pain today    Limitations Walking    Patient Stated Goals Walk without the cane    Pain Onset More than a month ago                [facility] PT Assessment - 10/12/20 0001       Assessment   Medical Diagnosis Lt peroneal nerve decompression, foot drop    Referring Provider (PT) Dr. Erlinda Hong    Onset Date/Surgical Date 06/23/20      Single Leg Stance   Comments Lt 3 second at best, Rt leg  10 seconds      Strength   Overall Strength Comments ankle strength tested in supine    Left Ankle Dorsiflexion 4+/5    Left Ankle Plantar Flexion 4+/5    Left Ankle Inversion 4/5    Left Ankle Eversion 3+/5                           [facility] Adult PT Treatment/Exercise - 10/12/20 0001       Neuro Re-ed    Neuro Re-ed Details  tandem walk and sidestepping on foam beam 4 round trips      Knee/Hip Exercises: Stretches   Risk manager Limitations slantboard      Knee/Hip Exercises: Aerobic   Nustep Lvl 7 10 mins      Knee/Hip Exercises: Machines for Strengthening   Total Gym Leg Press 87# DL 3X10, then SL 37# 3X10               Ankle Exercises: Supine   Other Supine Ankle Exercises alphabet on Lt 3# X1  Other Supine Ankle Exercises ankle DF,EV,INV green X20 ea      Ankle Exercises: Standing   Other Standing Ankle Exercises heel and toe raises 3X10, standing ankle eversion isometric pushing into ball at wall 5 sec 2X 15                      PT Short Term Goals - 09/28/20 0941       PT SHORT TERM GOAL #1   Title Patient will demonstrate independent use of home exercise program to maintain progress from in clinic treatments.    Baseline for current HEP    Status Achieved      PT SHORT TERM GOAL #2   Title he will hold SLS on Lt leg for 5 sec avg    Time 4    Period Weeks    Status New    Target Date 10/26/20               PT Long Term Goals - 09/28/20 0942       PT LONG TERM GOAL #1   Title Patient will demonstrate/report pain at worst less than or equal to 2/10 to facilitate minimal limitation in daily activity secondary to pain symptoms.    Status Achieved      PT LONG TERM GOAL #2   Title Patient will demonstrate independent use of home exercise program to facilitate ability to maintain/progress functional gains from skilled physical therapy services.    Baseline for current HEP, will need  to be updated by end of PT    Status Partially Met      PT LONG TERM GOAL #3   Title Pt. will demonstrate FOTO outcome > 55 to indicated reduced disability due to condition.    Status Achieved      PT LONG TERM GOAL #4   Title Pt. will demonstrate bilateral hip MMT > or = 4/5, ankle DF 5/5 bilateral to facilitate stability in ambulation, transfers independently.    Baseline hip grossly 4/5, lacking ankle EV strength    Status Partially Met      PT LONG TERM GOAL #5   Title Pt. will demonstrate BERG > 45 to indicate reduced fall risk.    Baseline 51 on 08/18/20    Status Achieved      Additional Long Term Goals   Additional Long Term Goals Yes      PT LONG TERM GOAL #6   Title Pt. will demonstrate independent ambulation community distances.    Status Achieved      PT LONG TERM GOAL #7   Title He will be able to hold SLS on left for >8 seconds    Time 8    Period Weeks    Status New    Target Date 11/23/20                   Plan - 10/12/20 1696     Clinical Impression Statement He still has difficulty with SLS and Lt ankle EV weakness that we are working to improve with PT. He shows great effort with PT and will continue to benefit from skilled PT to maximize his function.    Comorbidities 2020 Colon Cancer, Neuropathy in feet, PVD, prostate cancer.  Previously seen by clinic for Lt hip pain    Examination-Activity Limitations Squat;Bend;Stairs;Stand;Locomotion Level;Transfers    Examination-Participation Restrictions Community Activity;Shop;Yard Work    Product manager    Rehab Potential --  fair to good   PT Frequency 2x / week    PT Duration Other (comment)    PT Treatment/Interventions ADLs/Self Care Home Management;Electrical Stimulation;Iontophoresis 4m/ml Dexamethasone;Moist Heat;Balance training;Therapeutic exercise;Therapeutic activities;Functional mobility training;Stair training;Gait training;DME  Instruction;Ultrasound;Neuromuscular re-education;Patient/family education;Passive range of motion;Dry needling;Spinal Manipulations;Joint Manipulations;Taping;Manual techniques    PT Next Visit Plan focus on Lt ankle strength, gait and balance    PT Home Exercise Plan [id number]    Consulted and Agree with Plan of Care Patient             Patient will benefit from skilled therapeutic intervention in order to improve the following deficits and impairments:  Abnormal gait, Decreased endurance, Decreased activity tolerance, Pain, Decreased strength, Difficulty walking, Decreased mobility, Decreased balance, Decreased range of motion, Decreased coordination, Impaired perceived functional ability, Improper body mechanics  Visit Diagnosis: Pain in left lower leg  Muscle weakness (generalized)  Pain in left hip  Difficulty in walking, not elsewhere classified     Problem List Patient Active Problem List   Diagnosis Date Noted   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

## 2020-10-13 ENCOUNTER — Telehealth: Payer: Self-pay | Admitting: Family Medicine

## 2020-10-13 NOTE — Telephone Encounter (Signed)
Pt call and stated he don't want a appt at this time.

## 2020-10-13 NOTE — Telephone Encounter (Signed)
Documented on spreadsheet 

## 2020-10-14 ENCOUNTER — Other Ambulatory Visit: Payer: Self-pay

## 2020-10-14 ENCOUNTER — Ambulatory Visit (INDEPENDENT_AMBULATORY_CARE_PROVIDER_SITE_OTHER): Payer: Medicare Other | Admitting: Physical Therapy

## 2020-10-14 ENCOUNTER — Encounter: Payer: Self-pay | Admitting: Physical Therapy

## 2020-10-14 DIAGNOSIS — M6281 Muscle weakness (generalized): Secondary | ICD-10-CM

## 2020-10-14 DIAGNOSIS — R262 Difficulty in walking, not elsewhere classified: Secondary | ICD-10-CM | POA: Diagnosis not present

## 2020-10-14 DIAGNOSIS — M79662 Pain in left lower leg: Secondary | ICD-10-CM | POA: Diagnosis not present

## 2020-10-14 DIAGNOSIS — M25552 Pain in left hip: Secondary | ICD-10-CM | POA: Diagnosis not present

## 2020-10-14 NOTE — Therapy (Signed)
[facility] Physical Therapy [address] [place], [place], [address] Phone: [phone]   Fax:  [phone]  Physical Therapy Treatment  Patient Details  Name: Oscar Sparks MRN: [id number] Date of Birth: [birth year] Referring Provider (PT): Dr. Erlinda Hong   Encounter Date: 10/14/2020   PT End of Session - 10/14/20 0930     Visit Number 19    Number of Visits 26    Date for PT Re-Evaluation 11/23/20    Authorization Type Medicare, KX at 15    Progress Note Due on Visit 25    PT Start Time 0853    PT Stop Time 0931    PT Time Calculation (min) 38 min    Activity Tolerance Patient tolerated treatment well    Behavior During Therapy [facility] for tasks assessed/performed             Past Medical History:  Diagnosis Date   Arthritis    knees   Colon cancer ([place]) 09/24/2018   ED (erectile dysfunction)    Neuromuscular disorder ([place])    neuropathy in feet   Peripheral vascular disease ([place])    poor curculation in hands and feet hard to monitor with pulse oximetry   Prostate cancer [facility])    sees Dr. Gaynelle Arabian, received cesium seeds     Past Surgical History:  Procedure Laterality Date   [place]  2004   Dr. Sherwood Gambler   SUPERFICIAL PERONEAL NERVE RELEASE Left 06/23/2020   Procedure: left peroneal nerve neurolysis;  Surgeon: Leandrew Koyanagi, MD;  Location: [facility];  Service: Orthopedics;  Laterality: Left;   TONSILLECTOMY      There were no vitals filed for this visit.   Subjective Assessment - 10/14/20 0929     Subjective states he noticed that he can move his foot into eversion much better, and this is the best he has felt in 2 years.    Limitations Walking    Patient Stated Goals Walk without the cane    Pain Onset More than a month ago               [facility] Adult PT Treatment/Exercise - 10/14/20 0001       Knee/Hip Exercises: Aerobic   Nustep Lvl 7 10 mins      Knee/Hip Exercises: Machines for  Strengthening   Total Gym Leg Press 93# DL 3X10, then SL 43# X12, then 37# 2X10      Ankle Exercises: Standing   BAPS Level 3   5# 15 reps ea DF/PF/EV/INV circles   Other Standing Ankle Exercises heel and toe raises 3X10, standing ankle eversion isometric pushing into ball at wall 5 sec 2X 15      Ankle Exercises: Sidelying   Ankle Eversion Left;10 reps   4 sets                     PT Short Term Goals - 09/28/20 0941       PT SHORT TERM GOAL #1   Title Patient will demonstrate independent use of home exercise program to maintain progress from in clinic treatments.    Baseline for current HEP    Status Achieved      PT SHORT TERM GOAL #2   Title he will hold SLS on Lt leg for 5 sec avg    Time 4    Period Weeks    Status New    Target Date 10/26/20  PT Long Term Goals - 09/28/20 2979       PT LONG TERM GOAL #1   Title Patient will demonstrate/report pain at worst less than or equal to 2/10 to facilitate minimal limitation in daily activity secondary to pain symptoms.    Status Achieved      PT LONG TERM GOAL #2   Title Patient will demonstrate independent use of home exercise program to facilitate ability to maintain/progress functional gains from skilled physical therapy services.    Baseline for current HEP, will need to be updated by end of PT    Status Partially Met      PT LONG TERM GOAL #3   Title Pt. will demonstrate FOTO outcome > 55 to indicated reduced disability due to condition.    Status Achieved      PT LONG TERM GOAL #4   Title Pt. will demonstrate bilateral hip MMT > or = 4/5, ankle DF 5/5 bilateral to facilitate stability in ambulation, transfers independently.    Baseline hip grossly 4/5, lacking ankle EV strength    Status Partially Met      PT LONG TERM GOAL #5   Title Pt. will demonstrate BERG > 45 to indicate reduced fall risk.    Baseline 51 on 08/18/20    Status Achieved      Additional Long Term Goals    Additional Long Term Goals Yes      PT LONG TERM GOAL #6   Title Pt. will demonstrate independent ambulation community distances.    Status Achieved      PT LONG TERM GOAL #7   Title He will be able to hold SLS on left for >8 seconds    Time 8    Period Weeks    Status New    Target Date 11/23/20                   Plan - 10/14/20 0932     Clinical Impression Statement He had noticable improvment in Lt ankle EV strength and motor control however this is still overalll significantly decreased and we will continue to focus on this with PT. He is very encouraged by his progress today.    Comorbidities 2020 Colon Cancer, Neuropathy in feet, PVD, prostate cancer.  Previously seen by clinic for Lt hip pain    Examination-Activity Limitations Squat;Bend;Stairs;Stand;Locomotion Level;Transfers    Examination-Participation Restrictions Community Activity;Shop;Yard Work    Product manager    Rehab Potential --   fair to good   PT Frequency 2x / week    PT Duration Other (comment)    PT Treatment/Interventions ADLs/Self Care Home Management;Electrical Stimulation;Iontophoresis 50m/ml Dexamethasone;Moist Heat;Balance training;Therapeutic exercise;Therapeutic activities;Functional mobility training;Stair training;Gait training;DME Instruction;Ultrasound;Neuromuscular re-education;Patient/family education;Passive range of motion;Dry needling;Spinal Manipulations;Joint Manipulations;Taping;Manual techniques    PT Next Visit Plan focus on Lt ankle strength, gait and balance    PT Home Exercise Plan [id number]    Consulted and Agree with Plan of Care Patient             Patient will benefit from skilled therapeutic intervention in order to improve the following deficits and impairments:  Abnormal gait, Decreased endurance, Decreased activity tolerance, Pain, Decreased strength, Difficulty walking, Decreased mobility, Decreased balance,  Decreased range of motion, Decreased coordination, Impaired perceived functional ability, Improper body mechanics  Visit Diagnosis: Pain in left lower leg  Muscle weakness (generalized)  Pain in left hip  Difficulty in walking, not elsewhere classified     Problem  List Patient Active Problem List   Diagnosis Date Noted   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

## 2020-10-19 ENCOUNTER — Other Ambulatory Visit: Payer: Self-pay

## 2020-10-19 ENCOUNTER — Ambulatory Visit (INDEPENDENT_AMBULATORY_CARE_PROVIDER_SITE_OTHER): Payer: Medicare Other | Admitting: Physical Therapy

## 2020-10-19 DIAGNOSIS — R262 Difficulty in walking, not elsewhere classified: Secondary | ICD-10-CM

## 2020-10-19 DIAGNOSIS — M25552 Pain in left hip: Secondary | ICD-10-CM

## 2020-10-19 DIAGNOSIS — M79662 Pain in left lower leg: Secondary | ICD-10-CM | POA: Diagnosis not present

## 2020-10-19 DIAGNOSIS — M6281 Muscle weakness (generalized): Secondary | ICD-10-CM | POA: Diagnosis not present

## 2020-10-19 NOTE — Therapy (Signed)
[facility] Physical Therapy [address] [place], [place], [address] Phone: [phone]   Fax:  [phone]  Physical Therapy Treatment  Patient Details  Name: Oscar Sparks MRN: [id number] Date of Birth: April 21, 1940 Referring Provider (PT): Dr. Erlinda Hong   Encounter Date: 10/19/2020   PT End of Session - 10/19/20 0937     Visit Number 20    Number of Visits 26    Date for PT Re-Evaluation 11/23/20    Authorization Type Medicare, KX at 15    Progress Note Due on Visit 25    PT Start Time 0847    PT Stop Time 0931    PT Time Calculation (min) 44 min    Activity Tolerance Patient tolerated treatment well    Behavior During Therapy [facility] for tasks assessed/performed             Past Medical History:  Diagnosis Date   Arthritis    knees   Colon cancer ([place]) 09/24/2018   ED (erectile dysfunction)    Neuromuscular disorder ([place])    neuropathy in feet   Peripheral vascular disease ([place])    poor curculation in hands and feet hard to monitor with pulse oximetry   Prostate cancer [facility])    sees Dr. Gaynelle Arabian, received cesium seeds     Past Surgical History:  Procedure Laterality Date   [place]  2004   Dr. Sherwood Gambler   SUPERFICIAL PERONEAL NERVE RELEASE Left 06/23/2020   Procedure: left peroneal nerve neurolysis;  Surgeon: Leandrew Koyanagi, MD;  Location: [facility];  Service: Orthopedics;  Laterality: Left;   TONSILLECTOMY      There were no vitals filed for this visit.   Subjective Assessment - 10/19/20 0901     Subjective states he has been able to maintain the EV ROM he has gained from last week but it has not improved any.    Limitations Walking    Patient Stated Goals Walk without the cane    Currently in Pain? No/denies    Pain Onset More than a month ago              [facility] Adult PT Treatment/Exercise - 10/19/20 0001       Neuro Re-ed    Neuro Re-ed Details  tandem walk and sidestepping on foam beam 5  round trips      Knee/Hip Exercises: Aerobic   Nustep Lvl 7 10 mins      Ankle Exercises: Standing   BAPS Level 3    Other Standing Ankle Exercises heel and toe raises 3X10, standing ankle eversion isometric pushing into ball at wall 5 sec X 20, rocker board 20 A-P, 20 lateral      Ankle Exercises: Supine   Other Supine Ankle Exercises ankle DF,EV,INV green X20 ea      Ankle Exercises: Sidelying   Ankle Eversion Left;10 reps   3 sets 1#                     PT Short Term Goals - 09/28/20 0941       PT SHORT TERM GOAL #1   Title Patient will demonstrate independent use of home exercise program to maintain progress from in clinic treatments.    Baseline for current HEP    Status Achieved      PT SHORT TERM GOAL #2   Title he will hold SLS on Lt leg for 5 sec avg  Time 4    Period Weeks    Status New    Target Date 10/26/20               PT Long Term Goals - 09/28/20 0942       PT LONG TERM GOAL #1   Title Patient will demonstrate/report pain at worst less than or equal to 2/10 to facilitate minimal limitation in daily activity secondary to pain symptoms.    Status Achieved      PT LONG TERM GOAL #2   Title Patient will demonstrate independent use of home exercise program to facilitate ability to maintain/progress functional gains from skilled physical therapy services.    Baseline for current HEP, will need to be updated by end of PT    Status Partially Met      PT LONG TERM GOAL #3   Title Pt. will demonstrate FOTO outcome > 55 to indicated reduced disability due to condition.    Status Achieved      PT LONG TERM GOAL #4   Title Pt. will demonstrate bilateral hip MMT > or = 4/5, ankle DF 5/5 bilateral to facilitate stability in ambulation, transfers independently.    Baseline hip grossly 4/5, lacking ankle EV strength    Status Partially Met      PT LONG TERM GOAL #5   Title Pt. will demonstrate BERG > 45 to indicate reduced fall risk.     Baseline 51 on 08/18/20    Status Achieved      Additional Long Term Goals   Additional Long Term Goals Yes      PT LONG TERM GOAL #6   Title Pt. will demonstrate independent ambulation community distances.    Status Achieved      PT LONG TERM GOAL #7   Title He will be able to hold SLS on left for >8 seconds    Time 8    Period Weeks    Status New    Target Date 11/23/20                   Plan - 10/19/20 1443     Clinical Impression Statement We continued to work to improve his overall Lt ankle stability and EV strength today, he is slowly progressing overall with this but is making progress. He does not have pain with any activity today. Continue POC    Comorbidities 2020 Colon Cancer, Neuropathy in feet, PVD, prostate cancer.  Previously seen by clinic for Lt hip pain    Examination-Activity Limitations Squat;Bend;Stairs;Stand;Locomotion Level;Transfers    Examination-Participation Restrictions Community Activity;Shop;Yard Work    Product manager    Rehab Potential --   fair to good   PT Frequency 2x / week    PT Duration Other (comment)    PT Treatment/Interventions ADLs/Self Care Home Management;Electrical Stimulation;Iontophoresis 21m/ml Dexamethasone;Moist Heat;Balance training;Therapeutic exercise;Therapeutic activities;Functional mobility training;Stair training;Gait training;DME Instruction;Ultrasound;Neuromuscular re-education;Patient/family education;Passive range of motion;Dry needling;Spinal Manipulations;Joint Manipulations;Taping;Manual techniques    PT Next Visit Plan focus on Lt ankle strength, gait and balance    PT Home Exercise Plan [id number]    Consulted and Agree with Plan of Care Patient             Patient will benefit from skilled therapeutic intervention in order to improve the following deficits and impairments:  Abnormal gait, Decreased endurance, Decreased activity tolerance, Pain, Decreased  strength, Difficulty walking, Decreased mobility, Decreased balance, Decreased range of motion, Decreased coordination, Impaired perceived functional ability,  Improper body mechanics  Visit Diagnosis: Pain in left lower leg  Muscle weakness (generalized)  Pain in left hip  Difficulty in walking, not elsewhere classified     Problem List Patient Active Problem List   Diagnosis Date Noted   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   Cancer of ascending colon s/p robotic colectomy 11/20/2018 09/30/2018   Iron deficiency anemia 10/16/2016   Pain in left hip 06/25/2014   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

## 2020-10-21 ENCOUNTER — Other Ambulatory Visit: Payer: Self-pay

## 2020-10-21 ENCOUNTER — Ambulatory Visit (INDEPENDENT_AMBULATORY_CARE_PROVIDER_SITE_OTHER): Payer: Medicare Other | Admitting: Physical Therapy

## 2020-10-21 DIAGNOSIS — M79662 Pain in left lower leg: Secondary | ICD-10-CM | POA: Diagnosis not present

## 2020-10-21 DIAGNOSIS — R262 Difficulty in walking, not elsewhere classified: Secondary | ICD-10-CM | POA: Diagnosis not present

## 2020-10-21 DIAGNOSIS — M25552 Pain in left hip: Secondary | ICD-10-CM

## 2020-10-21 DIAGNOSIS — M6281 Muscle weakness (generalized): Secondary | ICD-10-CM

## 2020-10-21 NOTE — Therapy (Signed)
[facility] Physical Therapy [address] [place], [place], [address] Phone: [phone]   Fax:  [phone]  Physical Therapy Treatment  Patient Details  Name: Oscar Sparks MRN: [id number] Date of Birth: [birth year] Referring Provider (PT): Dr. Erlinda Hong   Encounter Date: 10/21/2020   PT End of Session - 10/21/20 0913     Visit Number 21    Number of Visits 26    Date for PT Re-Evaluation 11/23/20    Authorization Type Medicare, KX at 15    Progress Note Due on Visit 25    PT Start Time 0801    PT Stop Time 0846    PT Time Calculation (min) 45 min    Activity Tolerance Patient tolerated treatment well    Behavior During Therapy [facility] for tasks assessed/performed             Past Medical History:  Diagnosis Date   Arthritis    knees   Colon cancer ([place]) 09/24/2018   ED (erectile dysfunction)    Neuromuscular disorder ([place])    neuropathy in feet   Peripheral vascular disease ([place])    poor curculation in hands and feet hard to monitor with pulse oximetry   Prostate cancer [facility])    sees Dr. Gaynelle Arabian, received cesium seeds     Past Surgical History:  Procedure Laterality Date   [place]  2004   Dr. Sherwood Gambler   SUPERFICIAL PERONEAL NERVE RELEASE Left 06/23/2020   Procedure: left peroneal nerve neurolysis;  Surgeon: Leandrew Koyanagi, MD;  Location: [facility];  Service: Orthopedics;  Laterality: Left;   TONSILLECTOMY      There were no vitals filed for this visit.   Subjective Assessment - 10/21/20 0912     Subjective relays no pain at rest but some Lt hip pain when he walks, he is intrested in DN and wants to try this.    Limitations Walking    Patient Stated Goals Walk without the cane    Pain Onset More than a month ago                               [facility] Adult PT Treatment/Exercise - 10/21/20 0001       Knee/Hip Exercises: Aerobic   Recumbent Bike L5 X 10 min      Knee/Hip  Exercises: Sidelying   Other Sidelying Knee/Hip Exercises on Rt side hip abd 3X10, clams 3X10, ankle EV 3X10      Manual Therapy   Manual therapy comments STM with compression and palpation to glutes, peroneals, ITband      Ankle Exercises: Supine   Other Supine Ankle Exercises ankle DF,EV,INV green X20 ea              Trigger Point Dry Needling - 10/21/20 0001     Consent Given? Yes    Education Handout Provided Yes    Muscles Treated Lower Quadrant Peroneals    Dry Needling Comments and glutes, IT band    Other Dry Needling twitch response noted, good overall tolerance                    PT Short Term Goals - 09/28/20 0941       PT SHORT TERM GOAL #1   Title Patient will demonstrate independent use of home exercise program to maintain progress from in clinic treatments.    Baseline  for current HEP    Status Achieved      PT SHORT TERM GOAL #2   Title he will hold SLS on Lt leg for 5 sec avg    Time 4    Period Weeks    Status New    Target Date 10/26/20               PT Long Term Goals - 09/28/20 0942       PT LONG TERM GOAL #1   Title Patient will demonstrate/report pain at worst less than or equal to 2/10 to facilitate minimal limitation in daily activity secondary to pain symptoms.    Status Achieved      PT LONG TERM GOAL #2   Title Patient will demonstrate independent use of home exercise program to facilitate ability to maintain/progress functional gains from skilled physical therapy services.    Baseline for current HEP, will need to be updated by end of PT    Status Partially Met      PT LONG TERM GOAL #3   Title Pt. will demonstrate FOTO outcome > 55 to indicated reduced disability due to condition.    Status Achieved      PT LONG TERM GOAL #4   Title Pt. will demonstrate bilateral hip MMT > or = 4/5, ankle DF 5/5 bilateral to facilitate stability in ambulation, transfers independently.    Baseline hip grossly 4/5, lacking ankle EV  strength    Status Partially Met      PT LONG TERM GOAL #5   Title Pt. will demonstrate BERG > 45 to indicate reduced fall risk.    Baseline 51 on 08/18/20    Status Achieved      Additional Long Term Goals   Additional Long Term Goals Yes      PT LONG TERM GOAL #6   Title Pt. will demonstrate independent ambulation community distances.    Status Achieved      PT LONG TERM GOAL #7   Title He will be able to hold SLS on left for >8 seconds    Time 8    Period Weeks    Status New    Target Date 11/23/20                   Plan - 10/21/20 0915     Clinical Impression Statement Trialed DN today at his request to reduce glute pain with walking and to try to activate his peroneals. He had great twitch repsonse to this today and felt some immediate improvement.    Comorbidities 2020 Colon Cancer, Neuropathy in feet, PVD, prostate cancer.  Previously seen by clinic for Lt hip pain    Examination-Activity Limitations Squat;Bend;Stairs;Stand;Locomotion Level;Transfers    Examination-Participation Restrictions Community Activity;Shop;Yard Work    Product manager    Rehab Potential --   fair to good   PT Frequency 2x / week    PT Duration Other (comment)    PT Treatment/Interventions ADLs/Self Care Home Management;Electrical Stimulation;Iontophoresis 40m/ml Dexamethasone;Moist Heat;Balance training;Therapeutic exercise;Therapeutic activities;Functional mobility training;Stair training;Gait training;DME Instruction;Ultrasound;Neuromuscular re-education;Patient/family education;Passive range of motion;Dry needling;Spinal Manipulations;Joint Manipulations;Taping;Manual techniques    PT Next Visit Plan how was DN? focus on Lt ankle strength, gait and balance    PT Home Exercise Plan [id number]    Consulted and Agree with Plan of Care Patient             Patient will benefit from skilled therapeutic intervention in order to improve  the  following deficits and impairments:  Abnormal gait, Decreased endurance, Decreased activity tolerance, Pain, Decreased strength, Difficulty walking, Decreased mobility, Decreased balance, Decreased range of motion, Decreased coordination, Impaired perceived functional ability, Improper body mechanics  Visit Diagnosis: Pain in left lower leg  Muscle weakness (generalized)  Pain in left hip  Difficulty in walking, not elsewhere classified     Problem List Patient Active Problem List   Diagnosis Date Noted   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 [place], [place], [address] Phone: [phone]   Fax:  [phone]  Name: Oscar Sparks MRN: [id number] Date of Birth: February 14, 1941

## 2020-10-26 ENCOUNTER — Other Ambulatory Visit: Payer: Self-pay

## 2020-10-26 ENCOUNTER — Ambulatory Visit (INDEPENDENT_AMBULATORY_CARE_PROVIDER_SITE_OTHER): Payer: Medicare Other | Admitting: Physical Therapy

## 2020-10-26 DIAGNOSIS — M79662 Pain in left lower leg: Secondary | ICD-10-CM

## 2020-10-26 DIAGNOSIS — M25552 Pain in left hip: Secondary | ICD-10-CM

## 2020-10-26 DIAGNOSIS — R262 Difficulty in walking, not elsewhere classified: Secondary | ICD-10-CM | POA: Diagnosis not present

## 2020-10-26 DIAGNOSIS — M6281 Muscle weakness (generalized): Secondary | ICD-10-CM

## 2020-10-26 NOTE — Therapy (Signed)
[facility] Physical Therapy [address] [place], [place], [address] Phone: [phone]   Fax:  [phone]  Physical Therapy Treatment  Patient Details  Name: Oscar Sparks MRN: [id number] Date of Birth: 1941/02/01 Referring Provider (PT): Dr. Erlinda Hong   Encounter Date: 10/26/2020   PT End of Session - 10/26/20 0945     Visit Number 22    Number of Visits 26    Date for PT Re-Evaluation 11/23/20    Authorization Type Medicare, KX at 15    Progress Note Due on Visit 25    PT Start Time 0845    PT Stop Time 0928    PT Time Calculation (min) 43 min             Past Medical History:  Diagnosis Date   Arthritis    knees   Colon cancer ([place]) 09/24/2018   ED (erectile dysfunction)    Neuromuscular disorder ([place])    neuropathy in feet   Peripheral vascular disease ([place])    poor curculation in hands and feet hard to monitor with pulse oximetry   Prostate cancer [facility])    sees Dr. Gaynelle Arabian, received cesium seeds     Past Surgical History:  Procedure Laterality Date   [place]  2004   Dr. Sherwood Gambler   SUPERFICIAL PERONEAL NERVE RELEASE Left 06/23/2020   Procedure: left peroneal nerve neurolysis;  Surgeon: Leandrew Koyanagi, MD;  Location: [place];  Service: Orthopedics;  Laterality: Left;   TONSILLECTOMY      There were no vitals filed for this visit.   Subjective Assessment - 10/26/20 0912     Subjective relays the day he had DN performed he felt great but then the next day went back to the same, he is not having pain but he cant move his foot into eversion any better. He is intersted in trying NMES/Russian stimulation    Limitations Walking    Patient Stated Goals Walk without the cane    Pain Onset More than a month ago             [facility] Adult PT Treatment/Exercise - 10/26/20 0001       Exercises   Exercises Ankle      Knee/Hip Exercises: Aerobic   Nustep Lvl 7 10 mins      Modalities   Modalities  Electrical Stimulation      Electrical Stimulation   Electrical Stimulation Location Lt peroneals    Electrical Stimulation Action NMES/Russian    Electrical Stimulation Parameters 5/5 on/off for 10 min L 20 with active EV 5 min in SL, 5 min in supine      Ankle Exercises: Standing   Other Standing Ankle Exercises heel and toe raises 20, tandem walk 4 round trips at counter, SLS 30 sec X 3 with intermit fingertip support      Ankle Exercises: Seated   BAPS Level 3    BAPS Weights (lbs) 5    BAPS Limitations 20 reps all planes             PT Short Term Goals - 09/28/20 0941       PT SHORT TERM GOAL #1   Title Patient will demonstrate independent use of home exercise program to maintain progress from in clinic treatments.    Baseline for current HEP    Status Achieved      PT SHORT TERM GOAL #2   Title he will hold SLS  on Lt leg for 5 sec avg    Time 4    Period Weeks    Status New    Target Date 10/26/20               PT Long Term Goals - 09/28/20 0942       PT LONG TERM GOAL #1   Title Patient will demonstrate/report pain at worst less than or equal to 2/10 to facilitate minimal limitation in daily activity secondary to pain symptoms.    Status Achieved      PT LONG TERM GOAL #2   Title Patient will demonstrate independent use of home exercise program to facilitate ability to maintain/progress functional gains from skilled physical therapy services.    Baseline for current HEP, will need to be updated by end of PT    Status Partially Met      PT LONG TERM GOAL #3   Title Pt. will demonstrate FOTO outcome > 55 to indicated reduced disability due to condition.    Status Achieved      PT LONG TERM GOAL #4   Title Pt. will demonstrate bilateral hip MMT > or = 4/5, ankle DF 5/5 bilateral to facilitate stability in ambulation, transfers independently.    Baseline hip grossly 4/5, lacking ankle EV strength    Status Partially Met      PT LONG TERM GOAL #5    Title Pt. will demonstrate BERG > 45 to indicate reduced fall risk.    Baseline 51 on 08/18/20    Status Achieved      Additional Long Term Goals   Additional Long Term Goals Yes      PT LONG TERM GOAL #6   Title Pt. will demonstrate independent ambulation community distances.    Status Achieved      PT LONG TERM GOAL #7   Title He will be able to hold SLS on left for >8 seconds    Time 8    Period Weeks    Status New    Target Date 11/23/20                   Plan - 10/26/20 1751     Clinical Impression Statement No lasting effects from DN beyond one day so trialed NMES/Russian stimulation today in efforts to activate his peroneals. He was very fatigued after this, we will assess his response to this on thursday.    Comorbidities 2020 Colon Cancer, Neuropathy in feet, PVD, prostate cancer.  Previously seen by clinic for Lt hip pain    Examination-Activity Limitations Squat;Bend;Stairs;Stand;Locomotion Level;Transfers    Examination-Participation Restrictions Community Activity;Shop;Yard Work    Product manager    Rehab Potential --   fair to good   PT Frequency 2x / week    PT Duration Other (comment)    PT Treatment/Interventions ADLs/Self Care Home Management;Electrical Stimulation;Iontophoresis 46m/ml Dexamethasone;Moist Heat;Balance training;Therapeutic exercise;Therapeutic activities;Functional mobility training;Stair training;Gait training;DME Instruction;Ultrasound;Neuromuscular re-education;Patient/family education;Passive range of motion;Dry needling;Spinal Manipulations;Joint Manipulations;Taping;Manual techniques    PT Next Visit Plan how was NMES? focus on Lt ankle strength, gait and balance    PT Home Exercise Plan [id number]    Consulted and Agree with Plan of Care Patient             Patient will benefit from skilled therapeutic intervention in order to improve the following deficits and impairments:   Abnormal gait, Decreased endurance, Decreased activity tolerance, Pain, Decreased strength, Difficulty walking, Decreased mobility, Decreased balance,  Decreased range of motion, Decreased coordination, Impaired perceived functional ability, Improper body mechanics  Visit Diagnosis: Pain in left lower leg  Muscle weakness (generalized)  Pain in left hip  Difficulty in walking, not elsewhere classified     Problem List Patient Active Problem List   Diagnosis Date Noted   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

## 2020-10-28 ENCOUNTER — Other Ambulatory Visit: Payer: Self-pay

## 2020-10-28 ENCOUNTER — Ambulatory Visit (INDEPENDENT_AMBULATORY_CARE_PROVIDER_SITE_OTHER): Payer: Medicare Other | Admitting: Physical Therapy

## 2020-10-28 DIAGNOSIS — M6281 Muscle weakness (generalized): Secondary | ICD-10-CM

## 2020-10-28 DIAGNOSIS — M79662 Pain in left lower leg: Secondary | ICD-10-CM

## 2020-10-28 DIAGNOSIS — R262 Difficulty in walking, not elsewhere classified: Secondary | ICD-10-CM

## 2020-10-28 DIAGNOSIS — M25552 Pain in left hip: Secondary | ICD-10-CM

## 2020-10-28 NOTE — Therapy (Signed)
[facility] Physical Therapy [address] [place], [place], [address] Phone: [phone]   Fax:  [phone]  Physical Therapy Treatment  Patient Details  Name: Oscar Sparks MRN: [id number] Date of Birth: 1940/07/26 Referring Provider (PT): Dr. Erlinda Hong   Encounter Date: 10/28/2020   PT End of Session - 10/28/20 0911     Visit Number 23    Number of Visits 26    Date for PT Re-Evaluation 11/23/20    Authorization Type Medicare, KX at 15    Progress Note Due on Visit 25    PT Start Time 0845    PT Stop Time 0930    PT Time Calculation (min) 45 min    Activity Tolerance Patient tolerated treatment well    Behavior During Therapy [facility] for tasks assessed/performed             Past Medical History:  Diagnosis Date   Arthritis    knees   Colon cancer ([place]) 09/24/2018   ED (erectile dysfunction)    Neuromuscular disorder ([place])    neuropathy in feet   Peripheral vascular disease ([place])    poor curculation in hands and feet hard to monitor with pulse oximetry   Prostate cancer [facility])    sees Dr. Gaynelle Arabian, received cesium seeds     Past Surgical History:  Procedure Laterality Date   [place]  2004   Dr. [place] Gambler   SUPERFICIAL PERONEAL NERVE RELEASE Left 06/23/2020   Procedure: left peroneal nerve neurolysis;  Surgeon: Leandrew Koyanagi, MD;  Location: [place];  Service: Orthopedics;  Laterality: Left;   TONSILLECTOMY      There were no vitals filed for this visit.   Subjective Assessment - 10/28/20 0856     Subjective relays he felt the NMES stimulation helped. he was able to walk 1/2 mile and was not dragging his foot as much.    Limitations Walking    Patient Stated Goals Walk without the cane    Currently in Pain? No/denies    Pain Onset More than a month ago              [facility] Adult PT Treatment/Exercise - 10/28/20 0001       Knee/Hip Exercises: Aerobic   Recumbent Bike L3 X10 min      Electrical  Stimulation   Electrical Stimulation Location Lt peroneals    Electrical Stimulation Action NMES/Russian    Electrical Stimulation Parameters 5/5 on/off for 10 min      Ankle Exercises: Standing   Other Standing Ankle Exercises heel and toe raises 20, tandem walk 4 round trips at counter, SLS 30 sec X 3 with intermit fingertip support      Ankle Exercises: Stretches   Gastroc Stretch 3 reps;30 seconds   slantboard                     PT Short Term Goals - 10/28/20 0920       PT SHORT TERM GOAL #1   Title Patient will demonstrate independent use of home exercise program to maintain progress from in clinic treatments.    Baseline for current HEP    Status Achieved      PT SHORT TERM GOAL #2   Title he will hold SLS on Lt leg for 5 sec avg    Baseline 2-7 sec on 8/4    Time 4    Period Weeks    Status Achieved  Target Date 10/26/20               PT Long Term Goals - 09/28/20 0942       PT LONG TERM GOAL #1   Title Patient will demonstrate/report pain at worst less than or equal to 2/10 to facilitate minimal limitation in daily activity secondary to pain symptoms.    Status Achieved      PT LONG TERM GOAL #2   Title Patient will demonstrate independent use of home exercise program to facilitate ability to maintain/progress functional gains from skilled physical therapy services.    Baseline for current HEP, will need to be updated by end of PT    Status Partially Met      PT LONG TERM GOAL #3   Title Pt. will demonstrate FOTO outcome > 55 to indicated reduced disability due to condition.    Status Achieved      PT LONG TERM GOAL #4   Title Pt. will demonstrate bilateral hip MMT > or = 4/5, ankle DF 5/5 bilateral to facilitate stability in ambulation, transfers independently.    Baseline hip grossly 4/5, lacking ankle EV strength    Status Partially Met      PT LONG TERM GOAL #5   Title Pt. will demonstrate BERG > 45 to indicate reduced fall risk.     Baseline 51 on 08/18/20    Status Achieved      Additional Long Term Goals   Additional Long Term Goals Yes      PT LONG TERM GOAL #6   Title Pt. will demonstrate independent ambulation community distances.    Status Achieved      PT LONG TERM GOAL #7   Title He will be able to hold SLS on left for >8 seconds    Time 8    Period Weeks    Status New    Target Date 11/23/20                   Plan - 10/28/20 0912     Clinical Impression Statement He can tell some benefit from NMES/Russian stimulation so we performed this again with active Eversion exercising and had him hold the 5 sec contraction this time vs actively moving in and out for the 5 sec on time like we did previous session. Then continued with balance, strength, and stability program for his Lt ankle. Skilled PT remains medically necessary to improve his gait, balance, and foot drop to reduce falls and improve functional abilites.    Comorbidities 2020 Colon Cancer, Neuropathy in feet, PVD, prostate cancer.  Previously seen by clinic for Lt hip pain    Examination-Activity Limitations Squat;Bend;Stairs;Stand;Locomotion Level;Transfers    Examination-Participation Restrictions Community Activity;Shop;Yard Work    Product manager    Rehab Potential --   fair to good   PT Frequency 2x / week    PT Duration Other (comment)    PT Treatment/Interventions ADLs/Self Care Home Management;Electrical Stimulation;Iontophoresis 28m/ml Dexamethasone;Moist Heat;Balance training;Therapeutic exercise;Therapeutic activities;Functional mobility training;Stair training;Gait training;DME Instruction;Ultrasound;Neuromuscular re-education;Patient/family education;Passive range of motion;Dry needling;Spinal Manipulations;Joint Manipulations;Taping;Manual techniques    PT Next Visit Plan how was NMES? focus on Lt ankle strength, gait and balance    PT Home Exercise Plan [id number]    Consulted  and Agree with Plan of Care Patient             Patient will benefit from skilled therapeutic intervention in order to improve the following deficits and impairments:  Abnormal gait, Decreased endurance, Decreased activity tolerance, Pain, Decreased strength, Difficulty walking, Decreased mobility, Decreased balance, Decreased range of motion, Decreased coordination, Impaired perceived functional ability, Improper body mechanics  Visit Diagnosis: Pain in left lower leg  Muscle weakness (generalized)  Pain in left hip  Difficulty in walking, not elsewhere classified     Problem List Patient Active Problem List   Diagnosis Date Noted   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 [place], [place], [address] Phone: [phone]   Fax:  [phone]  Name: KELYN KOSKELA MRN: [id number] Date of Birth: [birth year]

## 2020-11-02 ENCOUNTER — Ambulatory Visit (INDEPENDENT_AMBULATORY_CARE_PROVIDER_SITE_OTHER): Payer: Medicare Other | Admitting: Physical Therapy

## 2020-11-02 ENCOUNTER — Other Ambulatory Visit: Payer: Self-pay

## 2020-11-02 DIAGNOSIS — M6281 Muscle weakness (generalized): Secondary | ICD-10-CM | POA: Diagnosis not present

## 2020-11-02 DIAGNOSIS — R262 Difficulty in walking, not elsewhere classified: Secondary | ICD-10-CM

## 2020-11-02 DIAGNOSIS — M79662 Pain in left lower leg: Secondary | ICD-10-CM

## 2020-11-02 DIAGNOSIS — M25552 Pain in left hip: Secondary | ICD-10-CM | POA: Diagnosis not present

## 2020-11-02 NOTE — Therapy (Signed)
[facility] Physical Therapy [address] [place], [place], [address] Phone: [phone]   Fax:  [phone]  Physical Therapy Treatment  Patient Details  Name: Oscar Sparks MRN: [id number] Date of Birth: [birth year] Referring Provider (PT): Dr. Erlinda Hong   Encounter Date: 11/02/2020   PT End of Session - 11/02/20 0914     Visit Number 24    Number of Visits 26    Date for PT Re-Evaluation 11/23/20    Authorization Type Medicare, KX at 15    Progress Note Due on Visit 25    PT Start Time 0845    PT Stop Time 0930    PT Time Calculation (min) 45 min    Activity Tolerance Patient tolerated treatment well    Behavior During Therapy [facility] for tasks assessed/performed             Past Medical History:  Diagnosis Date   Arthritis    knees   Colon cancer ([place]) 09/24/2018   ED (erectile dysfunction)    Neuromuscular disorder ([place])    neuropathy in feet   Peripheral vascular disease ([place])    poor curculation in hands and feet hard to monitor with pulse oximetry   Prostate cancer [facility])    sees Dr. Gaynelle Arabian, received cesium seeds     Past Surgical History:  Procedure Laterality Date   [place]  2004   Dr. [place] Gambler   SUPERFICIAL PERONEAL NERVE RELEASE Left 06/23/2020   Procedure: left peroneal nerve neurolysis;  Surgeon: Leandrew Koyanagi, MD;  Location: [place];  Service: Orthopedics;  Laterality: Left;   TONSILLECTOMY      There were no vitals filed for this visit.   Subjective Assessment - 11/02/20 0910     Subjective relays he feels about the same, can not tell any improvments from last week    Limitations Walking    Patient Stated Goals Walk without the cane    Pain Onset More than a month ago                               [facility] Adult PT Treatment/Exercise - 11/02/20 0001       Knee/Hip Exercises: Machines for Strengthening   Total Gym Leg Press 100# DL 3X10, then SL 43# 3X10       Knee/Hip Exercises: Supine   Other Supine Knee/Hip Exercises ankle 4 way green X20,    Other Supine Knee/Hip Exercises alphabet X 2      Electrical Stimulation   Electrical Stimulation Location Lt peroneals    Electrical Stimulation Action NMES/Russian    Electrical Stimulation Parameters 5/5 on/off for 10 min L 20 with active EV 5 min in SL, 5 min in supine      Ankle Exercises: Standing   Other Standing Ankle Exercises heel and toe raises 20, tandem walk 4 round trips at counter, SLS 30 sec X 3 with intermit fingertip support      Ankle Exercises: Aerobic   Recumbent Bike L4 X 8 min                      PT Short Term Goals - 10/28/20 0920       PT SHORT TERM GOAL #1   Title Patient will demonstrate independent use of home exercise program to maintain progress from in clinic treatments.    Baseline for current HEP  Status Achieved      PT SHORT TERM GOAL #2   Title he will hold SLS on Lt leg for 5 sec avg    Baseline 2-7 sec on 8/4    Time 4    Period Weeks    Status Achieved    Target Date 10/26/20               PT Long Term Goals - 09/28/20 0942       PT LONG TERM GOAL #1   Title Patient will demonstrate/report pain at worst less than or equal to 2/10 to facilitate minimal limitation in daily activity secondary to pain symptoms.    Status Achieved      PT LONG TERM GOAL #2   Title Patient will demonstrate independent use of home exercise program to facilitate ability to maintain/progress functional gains from skilled physical therapy services.    Baseline for current HEP, will need to be updated by end of PT    Status Partially Met      PT LONG TERM GOAL #3   Title Pt. will demonstrate FOTO outcome > 55 to indicated reduced disability due to condition.    Status Achieved      PT LONG TERM GOAL #4   Title Pt. will demonstrate bilateral hip MMT > or = 4/5, ankle DF 5/5 bilateral to facilitate stability in ambulation, transfers independently.     Baseline hip grossly 4/5, lacking ankle EV strength    Status Partially Met      PT LONG TERM GOAL #5   Title Pt. will demonstrate BERG > 45 to indicate reduced fall risk.    Baseline 51 on 08/18/20    Status Achieved      Additional Long Term Goals   Additional Long Term Goals Yes      PT LONG TERM GOAL #6   Title Pt. will demonstrate independent ambulation community distances.    Status Achieved      PT LONG TERM GOAL #7   Title He will be able to hold SLS on left for >8 seconds    Time 8    Period Weeks    Status New    Target Date 11/23/20                   Plan - 11/02/20 0916     Clinical Impression Statement We continued to work to improve Lt ankle eversion and DF strength combining with NMES. He does get very fatigued after this. He will need progress note next visit.    Comorbidities 2020 Colon Cancer, Neuropathy in feet, PVD, prostate cancer.  Previously seen by clinic for Lt hip pain    Examination-Activity Limitations Squat;Bend;Stairs;Stand;Locomotion Level;Transfers    Examination-Participation Restrictions Community Activity;Shop;Yard Work    Product manager    Rehab Potential --   fair to good   PT Frequency 2x / week    PT Duration Other (comment)    PT Treatment/Interventions ADLs/Self Care Home Management;Electrical Stimulation;Iontophoresis 64m/ml Dexamethasone;Moist Heat;Balance training;Therapeutic exercise;Therapeutic activities;Functional mobility training;Stair training;Gait training;DME Instruction;Ultrasound;Neuromuscular re-education;Patient/family education;Passive range of motion;Dry needling;Spinal Manipulations;Joint Manipulations;Taping;Manual techniques    PT Next Visit Plan how was NMES? focus on Lt ankle strength, gait and balance    PT Home Exercise Plan [id number]    Consulted and Agree with Plan of Care Patient             Patient will benefit from skilled therapeutic intervention  in order to improve  the following deficits and impairments:  Abnormal gait, Decreased endurance, Decreased activity tolerance, Pain, Decreased strength, Difficulty walking, Decreased mobility, Decreased balance, Decreased range of motion, Decreased coordination, Impaired perceived functional ability, Improper body mechanics  Visit Diagnosis: Pain in left lower leg  Muscle weakness (generalized)  Pain in left hip  Difficulty in walking, not elsewhere classified     Problem List Patient Active Problem List   Diagnosis Date Noted   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 [place], [place], [address] Phone: [phone]   Fax:  [phone]  Name: MALIKI GIGNAC MRN: [id number] Date of Birth: [birth year]

## 2020-11-04 ENCOUNTER — Ambulatory Visit (INDEPENDENT_AMBULATORY_CARE_PROVIDER_SITE_OTHER): Payer: Medicare Other | Admitting: Physical Therapy

## 2020-11-04 ENCOUNTER — Encounter: Payer: Self-pay | Admitting: Orthopaedic Surgery

## 2020-11-04 ENCOUNTER — Other Ambulatory Visit: Payer: Self-pay

## 2020-11-04 ENCOUNTER — Encounter: Payer: Self-pay | Admitting: Physical Therapy

## 2020-11-04 ENCOUNTER — Ambulatory Visit (INDEPENDENT_AMBULATORY_CARE_PROVIDER_SITE_OTHER): Payer: Medicare Other | Admitting: Orthopaedic Surgery

## 2020-11-04 DIAGNOSIS — R262 Difficulty in walking, not elsewhere classified: Secondary | ICD-10-CM | POA: Diagnosis not present

## 2020-11-04 DIAGNOSIS — G5732 Lesion of lateral popliteal nerve, left lower limb: Secondary | ICD-10-CM | POA: Diagnosis not present

## 2020-11-04 DIAGNOSIS — M25552 Pain in left hip: Secondary | ICD-10-CM | POA: Diagnosis not present

## 2020-11-04 DIAGNOSIS — M6281 Muscle weakness (generalized): Secondary | ICD-10-CM

## 2020-11-04 DIAGNOSIS — M79662 Pain in left lower leg: Secondary | ICD-10-CM | POA: Diagnosis not present

## 2020-11-04 NOTE — Progress Notes (Signed)
   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  He is overall doing well and has resumed most activities around the house.  He is moving some lumber around.  He reports no pain.  Occasionally has some locking of his knee but this is likely due to underlying osteoarthritis.  He continues to do physical therapy and dry needling.  Overall he is satisfied.  He understands that there is time for continued improvement from this condition.  I am glad that he is not having any pain and he seems to be quite functional with daily activities.  From my standpoint he is released to activity as tolerated.  We will see him back as needed.  Follow-Up Instructions: No follow-ups on file.   Orders:  No orders of the defined types were placed in this encounter.  No orders of the defined types were placed in this encounter.   Imaging: No results found.  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 Gaynelle Arabian, received cesium seeds     Family History  Problem Relation Age of Onset   Breast cancer Other     Past Surgical History:  Procedure Laterality Date   BACK SURGERY     SPINE SURGERY  2004   Dr. [place] Gambler   SUPERFICIAL PERONEAL NERVE RELEASE Left 06/23/2020   Procedure: left peroneal nerve neurolysis;  Surgeon: Leandrew Koyanagi, MD;  Location: [place];  Service: Orthopedics;  Laterality: Left;   TONSILLECTOMY     Social History   Occupational History   Not on file  Tobacco Use   Smoking status: Never   Smokeless tobacco: Never  Vaping Use   Vaping Use: Never used  Substance and Sexual Activity   Alcohol use: Yes    Alcohol/week: 1.0 standard drink    Types: 1 Glasses of wine per week   Drug use: No   Sexual activity: Not on file

## 2020-11-04 NOTE — Therapy (Addendum)
[facility] Physical Therapy [address] [place], [place], [address] Phone: [phone]   Fax:  [phone]  Physical Therapy Treatment/Progress note Progress Note reporting period date 09/28/20 to 11/04/20  See below for objective and subjective measurements relating to patients progress with PT.   Patient Details  Name: Oscar Sparks MRN: [id number] Date of Birth: [birth year] Referring Provider (PT): Dr. Erlinda Hong   Encounter Date: 11/04/2020   PT End of Session - 11/04/20 0934     Visit Number 25    Number of Visits 26    Date for PT Re-Evaluation 11/23/20    Authorization Type Medicare, KX at 15    Progress Note Due on Visit 26    PT Start Time 0845    PT Stop Time 0925    PT Time Calculation (min) 40 min    Activity Tolerance Patient tolerated treatment well    Behavior During Therapy [facility] for tasks assessed/performed             Past Medical History:  Diagnosis Date   Arthritis    knees   Colon cancer ([place]) 09/24/2018   ED (erectile dysfunction)    Neuromuscular disorder ([place])    neuropathy in feet   Peripheral vascular disease ([place])    poor curculation in hands and feet hard to monitor with pulse oximetry   Prostate cancer [facility])    sees Dr. Gaynelle Arabian, received cesium seeds     Past Surgical History:  Procedure Laterality Date   [place]  2004   Dr. Sherwood Gambler   SUPERFICIAL PERONEAL NERVE RELEASE Left 06/23/2020   Procedure: left peroneal nerve neurolysis;  Surgeon: Leandrew Koyanagi, MD;  Location: [place];  Service: Orthopedics;  Laterality: Left;   TONSILLECTOMY      There were no vitals filed for this visit.   Subjective Assessment - 11/04/20 0906     Subjective relays he feels somewhat better today when he woke up. He is not having pain but continues to have foot drop and weakness with eversion in his left ankle. He will follow up with MD today?    Limitations Walking    Patient Stated Goals Walk  without the cane    Pain Onset More than a month ago                [facility] PT Assessment - 11/04/20 0001       Assessment   Medical Diagnosis Lt peroneal nerve decompression, foot drop    Referring Provider (PT) Dr. Erlinda Hong    Onset Date/Surgical Date 06/23/20      Single Leg Stance   Comments Lt 3 second at best, Rt leg 10 seconds      AROM   Right Ankle Dorsiflexion --    Right Ankle Plantar Flexion --    Right Ankle Inversion --    Right Ankle Eversion --      Strength   Overall Strength Comments ankle strength tested in supine    Left Ankle Dorsiflexion 4+/5    Left Ankle Plantar Flexion 4+/5    Left Ankle Inversion 4/5    Left Ankle Eversion 3+/5                           [facility] Adult PT Treatment/Exercise - 11/04/20 0001       Exercises   Exercises Knee/Hip;Ankle      Knee/Hip Exercises: Machines for Strengthening  Cybex Knee Extension 10#  up with both down with left 3X10    Cybex Knee Flexion 25# 3X10 both    Total Gym Leg Press 100# DL 3X10, then SL 43# 3X10      Electrical Stimulation   Electrical Stimulation Location Lt peroneals    Electrical Stimulation Action NMES/Russian    Electrical Stimulation Parameters 5/5 on/off for 10 min L 20 with active EV 5 min in SL, 5 min in supine      Ankle Exercises: Stretches   Gastroc Stretch 3 reps;30 seconds      Ankle Exercises: Standing   Other Standing Ankle Exercises heel and toe raises 20, tandem walk 4 round trips at counter, SLS 30 sec X 3 with intermit fingertip support      Ankle Exercises: Supine   Other Supine Ankle Exercises alphabet X1, cirlcles X 20    Other Supine Ankle Exercises supine active Eversion X 5 min with NMES      Ankle Exercises: Sidelying   Ankle Eversion Limitations Lt AROM 5 min with NMES                      PT Short Term Goals - 10/28/20 0920       PT SHORT TERM GOAL #1   Title Patient will demonstrate independent use of home exercise program  to maintain progress from in clinic treatments.    Baseline for current HEP    Status Achieved      PT SHORT TERM GOAL #2   Title he will hold SLS on Lt leg for 5 sec avg    Baseline 2-7 sec on 8/4    Time 4    Period Weeks    Status Achieved    Target Date 10/26/20               PT Long Term Goals - 09/28/20 0942       PT LONG TERM GOAL #1   Title Patient will demonstrate/report pain at worst less than or equal to 2/10 to facilitate minimal limitation in daily activity secondary to pain symptoms.    Status Achieved      PT LONG TERM GOAL #2   Title Patient will demonstrate independent use of home exercise program to facilitate ability to maintain/progress functional gains from skilled physical therapy services.    Baseline for current HEP, will need to be updated by end of PT    Status Partially Met      PT LONG TERM GOAL #3   Title Pt. will demonstrate FOTO outcome > 55 to indicated reduced disability due to condition.    Status Achieved      PT LONG TERM GOAL #4   Title Pt. will demonstrate bilateral hip MMT > or = 4/5, ankle DF 5/5 bilateral to facilitate stability in ambulation, transfers independently.    Baseline hip grossly 4/5, lacking ankle EV strength    Status Partially Met      PT LONG TERM GOAL #5   Title Pt. will demonstrate BERG > 45 to indicate reduced fall risk.    Baseline 51 on 08/18/20    Status Achieved      Additional Long Term Goals   Additional Long Term Goals Yes      PT LONG TERM GOAL #6   Title Pt. will demonstrate independent ambulation community distances.    Status Achieved      PT LONG TERM GOAL #7   Title He  will be able to hold SLS on left for >8 seconds    Time 8    Period Weeks    Status New    Target Date 11/23/20                   Plan - 11/04/20 0992     Clinical Impression Statement He has made small improvements in Lt ankle ROM and strength but he contineus to be limited by weakness into DF and EV which is  still negatively impacting his gait and places him at a risk of falling. He will follow up with MD today. PT recommending to continue to work on strength in efforts to improve his gait and decrease risk of falling.    Comorbidities 2020 Colon Cancer, Neuropathy in feet, PVD, prostate cancer.  Previously seen by clinic for Lt hip pain    Examination-Activity Limitations Squat;Bend;Stairs;Stand;Locomotion Level;Transfers    Examination-Participation Restrictions Community Activity;Shop;Yard Work    Product manager    Rehab Potential --   fair to good   PT Frequency 2x / week    PT Duration Other (comment)    PT Treatment/Interventions ADLs/Self Care Home Management;Electrical Stimulation;Iontophoresis 44m/ml Dexamethasone;Moist Heat;Balance training;Therapeutic exercise;Therapeutic activities;Functional mobility training;Stair training;Gait training;DME Instruction;Ultrasound;Neuromuscular re-education;Patient/family education;Passive range of motion;Dry needling;Spinal Manipulations;Joint Manipulations;Taping;Manual techniques    PT Next Visit Plan what did MD say?  focus on Lt ankle strength, gait and balance    PT Home Exercise Plan [id number]    Consulted and Agree with Plan of Care Patient             Patient will benefit from skilled therapeutic intervention in order to improve the following deficits and impairments:  Abnormal gait, Decreased endurance, Decreased activity tolerance, Pain, Decreased strength, Difficulty walking, Decreased mobility, Decreased balance, Decreased range of motion, Decreased coordination, Impaired perceived functional ability, Improper body mechanics  Visit Diagnosis: Pain in left lower leg  Muscle weakness (generalized)  Pain in left hip  Difficulty in walking, not elsewhere classified     Problem List Patient Active Problem List   Diagnosis Date Noted   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 06/23/2020   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   Cancer of ascending colon s/p robotic colectomy 11/20/2018 09/30/2018   Iron deficiency anemia 10/16/2016   Pain in left hip 06/25/2014   Piriformis syndrome of left side 06/25/2014   [place] 11/15/2009   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

## 2020-11-09 ENCOUNTER — Telehealth: Payer: Self-pay | Admitting: Physical Therapy

## 2020-11-09 ENCOUNTER — Encounter: Payer: Medicare Other | Admitting: Physical Therapy

## 2020-11-09 NOTE — Telephone Encounter (Signed)
He did not arrive for his PT appointment today which is unusual for him. I called him and he was unaware that he had an appointment today. I confirmed his next appointment time with him and he plans to attend.  Elsie Ra, PT, DPT 11/09/20 9:12 AM

## 2020-11-10 DIAGNOSIS — Z23 Encounter for immunization: Secondary | ICD-10-CM | POA: Diagnosis not present

## 2020-11-11 ENCOUNTER — Ambulatory Visit (INDEPENDENT_AMBULATORY_CARE_PROVIDER_SITE_OTHER): Payer: Medicare Other | Admitting: Physical Therapy

## 2020-11-11 ENCOUNTER — Other Ambulatory Visit: Payer: Self-pay

## 2020-11-11 DIAGNOSIS — M25552 Pain in left hip: Secondary | ICD-10-CM

## 2020-11-11 DIAGNOSIS — M6281 Muscle weakness (generalized): Secondary | ICD-10-CM

## 2020-11-11 DIAGNOSIS — M79662 Pain in left lower leg: Secondary | ICD-10-CM | POA: Diagnosis not present

## 2020-11-11 DIAGNOSIS — R262 Difficulty in walking, not elsewhere classified: Secondary | ICD-10-CM

## 2020-11-11 NOTE — Therapy (Signed)
[facility] Physical Therapy [address] [place], [place], [address] Phone: [phone]   Fax:  [phone]  Physical Therapy Treatment/Progress note and Recert Progress Note reporting period date 09/28/20 to 11/11/20   See below for objective and subjective measurements relating to patients progress with PT.   Patient Details  Name: Oscar Sparks MRN: [id number] Date of Birth: 08-08-1940 Referring Provider (PT): Dr. Erlinda Hong   Encounter Date: 11/11/2020   PT End of Session - 11/11/20 0912     Visit Number 26    Number of Visits 40    Date for PT Re-Evaluation 02/03/21    Authorization Type Medicare, KX at 15    Progress Note Due on Visit 36    PT Start Time 0845    PT Stop Time 0927    PT Time Calculation (min) 42 min    Activity Tolerance Patient tolerated treatment well    Behavior During Therapy [facility] for tasks assessed/performed             Past Medical History:  Diagnosis Date   Arthritis    knees   Colon cancer ([place]) 09/24/2018   ED (erectile dysfunction)    Neuromuscular disorder ([place])    neuropathy in feet   Peripheral vascular disease ([place])    poor curculation in hands and feet hard to monitor with pulse oximetry   Prostate cancer [facility])    sees Dr. Gaynelle Arabian, received cesium seeds     Past Surgical History:  Procedure Laterality Date   [place]  2004   Dr. Sherwood Gambler   SUPERFICIAL PERONEAL NERVE RELEASE Left 06/23/2020   Procedure: left peroneal nerve neurolysis;  Surgeon: Leandrew Koyanagi, MD;  Location: [place];  Service: Orthopedics;  Laterality: Left;   TONSILLECTOMY      There were no vitals filed for this visit.   Subjective Assessment - 11/11/20 0911     Subjective He relays MD was still optimistic about progress and that march will be 1 year post op that it has not yet been a year so wants him to keep working.    Limitations Walking    Patient Stated Goals Walk without the cane    Pain  Onset More than a month ago                [facility] PT Assessment - 11/11/20 0001       Assessment   Medical Diagnosis Lt peroneal nerve decompression, foot drop    Referring Provider (PT) Dr. Erlinda Hong    Onset Date/Surgical Date 06/23/20      Single Leg Stance   Comments Lt 3 second at best, Rt leg 10 seconds      Strength   Overall Strength Comments ankle strength tested in supine, hip strength overall 4/5    Left Ankle Dorsiflexion 4+/5    Left Ankle Plantar Flexion 4+/5    Left Ankle Inversion 4+/5    Left Ankle Eversion 3+/5                           [facility] Adult PT Treatment/Exercise - 11/11/20 0001       Electrical Stimulation   Electrical Stimulation Location Lt peroneals    Electrical Stimulation Action Mili amp level 2 frequency for muscle activation to toleranted intensity with DN    Electrical Stimulation Parameters 5 min      Manual Therapy   Manual therapy  comments STM with compression and palpation to peroneals      Ankle Exercises: Stretches   Gastroc Stretch 3 reps;30 seconds      Ankle Exercises: Aerobic   Recumbent Bike L5 X 8 min      Ankle Exercises: Standing   Other Standing Ankle Exercises heel and toe raises 20, SLS 2-3 seconds X 10 on Lt      Ankle Exercises: Supine   T-Band green 4 way X 20 ea on Lt              Trigger Point Dry Needling - 11/11/20 0001     Consent Given? Yes    Muscles Treated Lower Quadrant Peroneals    Other Dry Needling good overall tolerance with visual contractions ellicited from stimulation, then performed pistoning at the end                    PT Short Term Goals - 11/11/20 1026       PT SHORT TERM GOAL #1   Title Patient will demonstrate independent use of home exercise program to maintain progress from in clinic treatments.    Baseline for current HEP    Status Achieved      PT SHORT TERM GOAL #2   Title he will hold SLS on Lt leg for 5 sec avg    Baseline 3 sec avg on  8/18    Time 4    Period Weeks    Status On-going    Target Date 10/26/20               PT Long Term Goals - 11/11/20 1024       PT LONG TERM GOAL #1   Title Patient will demonstrate/report pain at worst less than or equal to 2/10 to facilitate minimal limitation in daily activity secondary to pain symptoms.    Status Achieved      PT LONG TERM GOAL #2   Title Patient will demonstrate independent use of home exercise program to facilitate ability to maintain/progress functional gains from skilled physical therapy services.    Status Achieved      PT LONG TERM GOAL #3   Title Pt. will demonstrate FOTO outcome > 55 to indicated reduced disability due to condition.    Status Achieved      PT LONG TERM GOAL #4   Title Pt. will demonstrate bilateral hip MMT > or = 4/5, ankle DF 5/5 and EV to at least 4/5 to improve ambulation and reduce falls risk    Baseline hip grossly 4/5, lacking ankle EV strength only 3 to 3+    Status On-going      PT LONG TERM GOAL #5   Title Pt. will demonstrate BERG > 45 to indicate reduced fall risk.    Baseline 51 on 08/18/20    Status Achieved      PT LONG TERM GOAL #6   Title Pt. will demonstrate independent ambulation community distances.    Status Achieved      PT LONG TERM GOAL #7   Title He will be able to hold SLS on left for >8 seconds    Baseline 3 sec avg on 8/18    Time 8    Period Weeks    Status On-going                   Plan - 11/11/20 0948     Clinical Impression Statement He continues to make small  improvments with Lt ankle strength, stability, and balance but still with significant deficits limiting his overall function. MD and PT are recommending to continue PT and that PT remains medically necessary to minimize his risk of falling and maximize his overall funcitonal levels with gait and walking.    Comorbidities 2020 Colon Cancer, Neuropathy in feet, PVD, prostate cancer.  Previously seen by clinic for Lt hip  pain    Examination-Activity Limitations Squat;Bend;Stairs;Stand;Locomotion Level;Transfers    Examination-Participation Restrictions Community Activity;Shop;Yard Work    Product manager    Rehab Potential --   fair to good   PT Frequency 2x / week    PT Duration Other (comment)    PT Treatment/Interventions ADLs/Self Care Home Management;Electrical Stimulation;Iontophoresis '4mg'$ /ml Dexamethasone;Moist Heat;Balance training;Therapeutic exercise;Therapeutic activities;Functional mobility training;Stair training;Gait training;DME Instruction;Ultrasound;Neuromuscular re-education;Patient/family education;Passive range of motion;Dry needling;Spinal Manipulations;Joint Manipulations;Taping;Manual techniques    PT Next Visit Plan how was DN with E stim or does he like [place] EMES better? focus on Lt ankle strength, gait and balance    PT Home Exercise Plan [id number]    Consulted and Agree with Plan of Care Patient             Patient will benefit from skilled therapeutic intervention in order to improve the following deficits and impairments:  Abnormal gait, Decreased endurance, Decreased activity tolerance, Pain, Decreased strength, Difficulty walking, Decreased mobility, Decreased balance, Decreased range of motion, Decreased coordination, Impaired perceived functional ability, Improper body mechanics  Visit Diagnosis: Pain in left lower leg  Muscle weakness (generalized)  Pain in left hip  Difficulty in walking, not elsewhere classified     Problem List Patient Active Problem List   Diagnosis Date Noted   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 [place], [place], [address] Phone: [phone]   Fax:  [phone]  Name: GOPAL MELODY MRN: [id number] Date of Birth: 20-Mar-1941

## 2020-11-23 ENCOUNTER — Other Ambulatory Visit: Payer: Self-pay

## 2020-11-23 ENCOUNTER — Ambulatory Visit (INDEPENDENT_AMBULATORY_CARE_PROVIDER_SITE_OTHER): Payer: Medicare Other | Admitting: Physical Therapy

## 2020-11-23 DIAGNOSIS — M6281 Muscle weakness (generalized): Secondary | ICD-10-CM | POA: Diagnosis not present

## 2020-11-23 DIAGNOSIS — M25552 Pain in left hip: Secondary | ICD-10-CM

## 2020-11-23 DIAGNOSIS — M79662 Pain in left lower leg: Secondary | ICD-10-CM

## 2020-11-23 DIAGNOSIS — R262 Difficulty in walking, not elsewhere classified: Secondary | ICD-10-CM

## 2020-11-24 NOTE — Therapy (Signed)
[facility] Physical Therapy [address] [place], [place], [address] Phone: [phone]   Fax:  [phone]  Physical Therapy Treatment  Patient Details  Name: Oscar Sparks MRN: [id number] Date of Birth: June 06, 1940 Referring Provider (PT): Dr. Erlinda Hong   Encounter Date: 11/23/2020   PT End of Session - 11/24/20 0805     Visit Number 27    Number of Visits 40    Date for PT Re-Evaluation 02/03/21    Authorization Type Medicare, KX at 15    Progress Note Due on Visit 55    PT Start Time 1510    PT Stop Time 1550    PT Time Calculation (min) 40 min    Activity Tolerance Patient tolerated treatment well    Behavior During Therapy [facility] for tasks assessed/performed             Past Medical History:  Diagnosis Date   Arthritis    knees   Colon cancer ([place]) 09/24/2018   ED (erectile dysfunction)    Neuromuscular disorder ([place])    neuropathy in feet   Peripheral vascular disease ([place])    poor curculation in hands and feet hard to monitor with pulse oximetry   Prostate cancer [facility])    sees Dr. Gaynelle Arabian, received cesium seeds     Past Surgical History:  Procedure Laterality Date   [place]  2004   Dr. Sherwood Gambler   SUPERFICIAL PERONEAL NERVE RELEASE Left 06/23/2020   Procedure: left peroneal nerve neurolysis;  Surgeon: Leandrew Koyanagi, MD;  Location: [place];  Service: Orthopedics;  Laterality: Left;   TONSILLECTOMY      There were no vitals filed for this visit.   Subjective Assessment - 11/24/20 0804     Subjective He relays no real change in function from DN with Estim, feels the NMES pads work a little better, he also relays fatigue today as this is late in the day for PT for him.    Limitations Walking    Patient Stated Goals Walk without the cane    Pain Onset More than a month ago                               [facility] Adult PT Treatment/Exercise - 11/24/20 0001       Knee/Hip  Exercises: Machines for Strengthening   Total Gym Leg Press 100# DL 3X10, then SL 43# 3X10      Electrical Stimulation   Electrical Stimulation Location Lt peroneals    Electrical Stimulation Action NMES/Russian    Electrical Stimulation Parameters 5/5 on off for 10  min with active EV ROM in SL      Ankle Exercises: Aerobic   Recumbent Bike L4 X 8 min      Ankle Exercises: Standing   Other Standing Ankle Exercises tandem walk X 3 round trips, lateral walk red band around ankles 3 round trips, standing hip extensions X 20 bilat    Other Standing Ankle Exercises heel and toe raises 20,              Trigger Point Dry Needling - 11/24/20 0001     Consent Given? --    Muscles Treated Lower Quadrant --    Other Dry Needling --                    PT Short Term Goals - 11/11/20  [place] #1   Title Patient will demonstrate independent use of home exercise program to maintain progress from in clinic treatments.    Baseline for current HEP    Status Achieved      PT SHORT TERM GOAL #2   Title he will hold SLS on Lt leg for 5 sec avg    Baseline 3 sec avg on 8/18    Time 4    Period Weeks    Status On-going    Target Date 10/26/20               PT Long Term Goals - 11/11/20 1024       PT LONG TERM GOAL #1   Title Patient will demonstrate/report pain at worst less than or equal to 2/10 to facilitate minimal limitation in daily activity secondary to pain symptoms.    Status Achieved      PT LONG TERM GOAL #2   Title Patient will demonstrate independent use of home exercise program to facilitate ability to maintain/progress functional gains from skilled physical therapy services.    Status Achieved      PT LONG TERM GOAL #3   Title Pt. will demonstrate FOTO outcome > 55 to indicated reduced disability due to condition.    Status Achieved      PT LONG TERM GOAL #4   Title Pt. will demonstrate bilateral hip MMT > or = 4/5, ankle DF  5/5 and EV to at least 4/5 to improve ambulation and reduce falls risk    Baseline hip grossly 4/5, lacking ankle EV strength only 3 to 3+    Status On-going      PT LONG TERM GOAL #5   Title Pt. will demonstrate BERG > 45 to indicate reduced fall risk.    Baseline 51 on 08/18/20    Status Achieved      PT LONG TERM GOAL #6   Title Pt. will demonstrate independent ambulation community distances.    Status Achieved      PT LONG TERM GOAL #7   Title He will be able to hold SLS on left for >8 seconds    Baseline 3 sec avg on 8/18    Time 8    Period Weeks    Status On-going                   Plan - 11/24/20 0806     Clinical Impression Statement He was more tired today and did not have as good overall activity performance. We used NMES with active EV movement to try to faciliate and strengthen his peroneals. We also added more overall hip strength today as he has been having reports of weaknss with this. He will continue to benefit from PT.    Comorbidities 2020 Colon Cancer, Neuropathy in feet, PVD, prostate cancer.  Previously seen by clinic for Lt hip pain    Examination-Activity Limitations Squat;Bend;Stairs;Stand;Locomotion Level;Transfers    Examination-Participation Restrictions Community Activity;Shop;Yard Work    Product manager    Rehab Potential --   fair to good   PT Frequency 2x / week    PT Duration Other (comment)    PT Treatment/Interventions ADLs/Self Care Home Management;Electrical Stimulation;Iontophoresis '4mg'$ /ml Dexamethasone;Moist Heat;Balance training;Therapeutic exercise;Therapeutic activities;Functional mobility training;Stair training;Gait training;DME Instruction;Ultrasound;Neuromuscular re-education;Patient/family education;Passive range of motion;Dry needling;Spinal Manipulations;Joint Manipulations;Taping;Manual techniques    PT Next Visit Plan Lt ankle strength, gait and balance, Lt hip strength.  PT Home Exercise Plan [id number]    Consulted and Agree with Plan of Care Patient             Patient will benefit from skilled therapeutic intervention in order to improve the following deficits and impairments:  Abnormal gait, Decreased endurance, Decreased activity tolerance, Pain, Decreased strength, Difficulty walking, Decreased mobility, Decreased balance, Decreased range of motion, Decreased coordination, Impaired perceived functional ability, Improper body mechanics  Visit Diagnosis: Pain in left lower leg  Muscle weakness (generalized)  Pain in left hip  Difficulty in walking, not elsewhere classified     Problem List Patient Active Problem List   Diagnosis Date Noted   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

## 2020-12-01 ENCOUNTER — Other Ambulatory Visit: Payer: Self-pay

## 2020-12-01 ENCOUNTER — Ambulatory Visit (INDEPENDENT_AMBULATORY_CARE_PROVIDER_SITE_OTHER): Payer: Medicare Other | Admitting: Physical Therapy

## 2020-12-01 DIAGNOSIS — M6281 Muscle weakness (generalized): Secondary | ICD-10-CM | POA: Diagnosis not present

## 2020-12-01 DIAGNOSIS — R262 Difficulty in walking, not elsewhere classified: Secondary | ICD-10-CM | POA: Diagnosis not present

## 2020-12-01 DIAGNOSIS — M25552 Pain in left hip: Secondary | ICD-10-CM

## 2020-12-01 DIAGNOSIS — M79662 Pain in left lower leg: Secondary | ICD-10-CM

## 2020-12-01 NOTE — Therapy (Addendum)
[facility] Physical Therapy [address] [place], [place], [address] Phone: [phone]   Fax:  [phone]  Physical Therapy Treatment  Patient Details  Name: Oscar Sparks MRN: [id number] Date of Birth: [birth year] Referring Provider (PT): Dr. Erlinda Hong   Encounter Date: 12/01/2020   PT End of Session - 12/01/20 0850     Visit Number 28    Number of Visits 40    Date for PT Re-Evaluation 02/03/21    Progress Note Due on Visit 29    PT Start Time 0805    PT Stop Time 0845    PT Time Calculation (min) 40 min    Activity Tolerance Patient tolerated treatment well    Behavior During Therapy [facility] for tasks assessed/performed             Past Medical History:  Diagnosis Date   Arthritis    knees   Colon cancer ([place]) 09/24/2018   ED (erectile dysfunction)    Neuromuscular disorder ([place])    neuropathy in feet   Peripheral vascular disease ([place])    poor curculation in hands and feet hard to monitor with pulse oximetry   Prostate cancer [facility])    sees Dr. Gaynelle Arabian, received cesium seeds     Past Surgical History:  Procedure Laterality Date   [place]  2004   Dr. Sherwood Gambler   SUPERFICIAL PERONEAL NERVE RELEASE Left 06/23/2020   Procedure: left peroneal nerve neurolysis;  Surgeon: Leandrew Koyanagi, MD;  Location: [place];  Service: Orthopedics;  Laterality: Left;   TONSILLECTOMY      There were no vitals filed for this visit.   Subjective Assessment - 12/01/20 0843     Subjective He relays having a better day in terms of more motion with Eversion.    Limitations Walking    Patient Stated Goals Walk without the cane    Pain Onset More than a month ago                               [facility] Adult PT Treatment/Exercise - 12/01/20 0001       Electrical Stimulation   Electrical Stimulation Location Lt peroneals and anterior tib    Electrical Stimulation Action NMES/Russian    Electrical  Stimulation Parameters 5/5 sec on/off co contract with active movment      Ankle Exercises: Clinical research associate 3 reps;30 seconds      Ankle Exercises: Standing   SLS Lt leg 5 sec X 10 reps    Other Standing Ankle Exercises heel and toe raises 20,      Ankle Exercises: Aerobic   Recumbent Bike L4 X 10 min      Ankle Exercises: Sidelying   Ankle Eversion --   8 min with NMES 5/5 sec on/off                      PT Short Term Goals - 11/11/20 1026       PT SHORT TERM GOAL #1   Title Patient will demonstrate independent use of home exercise program to maintain progress from in clinic treatments.    Baseline for current HEP    Status Achieved      PT SHORT TERM GOAL #2   Title he will hold SLS on Lt leg for 5 sec avg    Baseline 3 sec avg on 8/18  Time 4    Period Weeks    Status On-going    Target Date 10/26/20               PT Long Term Goals - 11/11/20 1024       PT LONG TERM GOAL #1   Title Patient will demonstrate/report pain at worst less than or equal to 2/10 to facilitate minimal limitation in daily activity secondary to pain symptoms.    Status Achieved      PT LONG TERM GOAL #2   Title Patient will demonstrate independent use of home exercise program to facilitate ability to maintain/progress functional gains from skilled physical therapy services.    Status Achieved      PT LONG TERM GOAL #3   Title Pt. will demonstrate FOTO outcome > 55 to indicated reduced disability due to condition.    Status Achieved      PT LONG TERM GOAL #4   Title Pt. will demonstrate bilateral hip MMT > or = 4/5, ankle DF 5/5 and EV to at least 4/5 to improve ambulation and reduce falls risk    Baseline hip grossly 4/5, lacking ankle EV strength only 3 to 3+    Status On-going      PT LONG TERM GOAL #5   Title Pt. will demonstrate BERG > 45 to indicate reduced fall risk.    Baseline 51 on 08/18/20    Status Achieved      PT LONG TERM GOAL #6    Title Pt. will demonstrate independent ambulation community distances.    Status Achieved      PT LONG TERM GOAL #7   Title He will be able to hold SLS on left for >8 seconds    Baseline 3 sec avg on 8/18    Time 8    Period Weeks    Status On-going                   Plan - 12/01/20 0850     Clinical Impression Statement After further assessing his gait he appears to have leg length descrepency with left leg being longer. He confirms that this is the case so I provided him with a version of a heel lift to try in his Rt shoe to help him overcome this as his left leg has a more difficult time with foot clearance due to this with added complicaiton of DF and EV weakness post op. I also provided him with a print out of "adjust a lift" shoe insert as well as "foot up" brace which may be helpful for him to reduce his risk of falling by improving foot clerance.    Comorbidities 2020 Colon Cancer, Neuropathy in feet, PVD, prostate cancer.  Previously seen by clinic for Lt hip pain    Examination-Activity Limitations Squat;Bend;Stairs;Stand;Locomotion Level;Transfers    Examination-Participation Restrictions Community Activity;Shop;Yard Work    Product manager    Rehab Potential --   fair to good   PT Frequency 2x / week    PT Duration Other (comment)    PT Treatment/Interventions ADLs/Self Care Home Management;Electrical Stimulation;Iontophoresis '4mg'$ /ml Dexamethasone;Moist Heat;Balance training;Therapeutic exercise;Therapeutic activities;Functional mobility training;Stair training;Gait training;DME Instruction;Ultrasound;Neuromuscular re-education;Patient/family education;Passive range of motion;Dry needling;Spinal Manipulations;Joint Manipulations;Taping;Manual techniques    PT Next Visit Plan how was heel lift? Lt ankle strength, gait and balance, Lt hip strength.    PT Home Exercise Plan [id number]    Consulted and Agree with Plan of Care  Patient  Patient will benefit from skilled therapeutic intervention in order to improve the following deficits and impairments:  Abnormal gait, Decreased endurance, Decreased activity tolerance, Pain, Decreased strength, Difficulty walking, Decreased mobility, Decreased balance, Decreased range of motion, Decreased coordination, Impaired perceived functional ability, Improper body mechanics  Visit Diagnosis: Pain in left lower leg  Muscle weakness (generalized)  Pain in left hip  Difficulty in walking, not elsewhere classified     Problem List Patient Active Problem List   Diagnosis Date Noted   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   Cancer of ascending colon s/p robotic colectomy 11/20/2018 09/30/2018   Iron deficiency anemia 10/16/2016   Pain in left hip 06/25/2014   Piriformis syndrome of left side 06/25/2014   Prostate cancer ([place]) 11/15/2009   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

## 2020-12-08 ENCOUNTER — Other Ambulatory Visit: Payer: Self-pay

## 2020-12-08 ENCOUNTER — Ambulatory Visit (INDEPENDENT_AMBULATORY_CARE_PROVIDER_SITE_OTHER): Payer: Medicare Other | Admitting: Physical Therapy

## 2020-12-08 DIAGNOSIS — M25552 Pain in left hip: Secondary | ICD-10-CM

## 2020-12-08 DIAGNOSIS — R262 Difficulty in walking, not elsewhere classified: Secondary | ICD-10-CM | POA: Diagnosis not present

## 2020-12-08 DIAGNOSIS — M79662 Pain in left lower leg: Secondary | ICD-10-CM | POA: Diagnosis not present

## 2020-12-08 DIAGNOSIS — M6281 Muscle weakness (generalized): Secondary | ICD-10-CM | POA: Diagnosis not present

## 2020-12-08 NOTE — Therapy (Signed)
[facility] Physical Therapy [address] [place], [place], [address] Phone: [phone]   Fax:  [phone]  Physical Therapy Treatment  Patient Details  Name: Oscar Sparks MRN: [id number] Date of Birth: 1940/07/13 Referring Provider (PT): Dr. Erlinda Hong   Encounter Date: 12/08/2020   PT End of Session - 12/08/20 0857     Visit Number 29    Number of Visits 40    Date for PT Re-Evaluation 02/03/21    Progress Note Due on Visit 49    PT Start Time 0801    PT Stop Time 0850    PT Time Calculation (min) 49 min    Activity Tolerance Patient tolerated treatment well    Behavior During Therapy [facility] for tasks assessed/performed             Past Medical History:  Diagnosis Date   Arthritis    knees   Colon cancer ([place]) 09/24/2018   ED (erectile dysfunction)    Neuromuscular disorder ([facility])    neuropathy in feet   Peripheral vascular disease ([place])    poor curculation in hands and feet hard to monitor with pulse oximetry   Prostate cancer [facility])    sees Dr. Gaynelle Arabian, received cesium seeds     Past Surgical History:  Procedure Laterality Date   [place]  2004   Dr. Sherwood Gambler   SUPERFICIAL PERONEAL NERVE RELEASE Left 06/23/2020   Procedure: left peroneal nerve neurolysis;  Surgeon: Leandrew Koyanagi, MD;  Location: [place];  Service: Orthopedics;  Laterality: Left;   TONSILLECTOMY      There were no vitals filed for this visit.   Subjective Assessment - 12/08/20 0841     Subjective He relays noticing some "baby step" improvments and walking with the heel lift in his Rt shoe has helped some.    Limitations Walking    Patient Stated Goals Walk without the cane    Pain Onset More than a month ago                               [facility] Adult PT Treatment/Exercise - 12/08/20 0001       Ambulation/Gait   Ambulation/Gait Yes    Ambulation/Gait Assistance 5: Supervision    Ambulation Distance (Feet)  150 Feet    Gait Comments gait unsteadiness with decreased Lt foot clearance, trendelenburg gait      Knee/Hip Exercises: Machines for Strengthening   Total Gym Leg Press 100# DL 3X10, then SL 37# 3X10      Electrical Stimulation   Electrical Stimulation Location Lt peroneals and anterior tib    Electrical Stimulation Action NMES/Russian    Electrical Stimulation Parameters 5/5 sec on/off co contract with active DF and EV X10 min      Ankle Exercises: Standing   Other Standing Ankle Exercises tandem walk X 4 round trips, lateral walk green band around ankles 4 round trips,    Other Standing Ankle Exercises heel and toe raises 2X15      Ankle Exercises: Aerobic   Recumbent Bike L5 X 10 min      Ankle Exercises: Supine   T-Band green 4 way X 20 ea on Lt                       PT Short Term Goals - 11/11/20 1026       PT SHORT  TERM GOAL #1   Title Patient will demonstrate independent use of home exercise program to maintain progress from in clinic treatments.    Baseline for current HEP    Status Achieved      PT SHORT TERM GOAL #2   Title he will hold SLS on Lt leg for 5 sec avg    Baseline 3 sec avg on 8/18    Time 4    Period Weeks    Status On-going    Target Date 10/26/20               PT Long Term Goals - 11/11/20 1024       PT LONG TERM GOAL #1   Title Patient will demonstrate/report pain at worst less than or equal to 2/10 to facilitate minimal limitation in daily activity secondary to pain symptoms.    Status Achieved      PT LONG TERM GOAL #2   Title Patient will demonstrate independent use of home exercise program to facilitate ability to maintain/progress functional gains from skilled physical therapy services.    Status Achieved      PT LONG TERM GOAL #3   Title Pt. will demonstrate FOTO outcome > 55 to indicated reduced disability due to condition.    Status Achieved      PT LONG TERM GOAL #4   Title Pt. will demonstrate bilateral  hip MMT > or = 4/5, ankle DF 5/5 and EV to at least 4/5 to improve ambulation and reduce falls risk    Baseline hip grossly 4/5, lacking ankle EV strength only 3 to 3+    Status On-going      PT LONG TERM GOAL #5   Title Pt. will demonstrate BERG > 45 to indicate reduced fall risk.    Baseline 51 on 08/18/20    Status Achieved      PT LONG TERM GOAL #6   Title Pt. will demonstrate independent ambulation community distances.    Status Achieved      PT LONG TERM GOAL #7   Title He will be able to hold SLS on left for >8 seconds    Baseline 3 sec avg on 8/18    Time 8    Period Weeks    Status On-going                   Plan - 12/08/20 0859     Clinical Impression Statement Continued to work toward improving Lt ankle DF and EV strength limiting his foot clearance. This may have improved slightly since adding heel lift in his Rt shoe as his left leg is also longer. He had good tolerance to strengthening program and NMES/russian stimulation but continues to have gait unsteadiness and will continue to benefit from skilled PT.    Comorbidities 2020 Colon Cancer, Neuropathy in feet, PVD, prostate cancer.  Previously seen by clinic for Lt hip pain    Examination-Activity Limitations Squat;Bend;Stairs;Stand;Locomotion Level;Transfers    Examination-Participation Restrictions Community Activity;Shop;Yard Work    Product manager    Rehab Potential --   fair to good   PT Frequency 2x / week    PT Duration Other (comment)    PT Treatment/Interventions ADLs/Self Care Home Management;Electrical Stimulation;Iontophoresis '4mg'$ /ml Dexamethasone;Moist Heat;Balance training;Therapeutic exercise;Therapeutic activities;Functional mobility training;Stair training;Gait training;DME Instruction;Ultrasound;Neuromuscular re-education;Patient/family education;Passive range of motion;Dry needling;Spinal Manipulations;Joint Manipulations;Taping;Manual  techniques    PT Next Visit Plan Lt ankle strength and stability with focus on DF and EV, gait  and balance, Lt leg strength with focus on quads and hip abductors    PT Home Exercise Plan [id number]    Consulted and Agree with Plan of Care Patient             Patient will benefit from skilled therapeutic intervention in order to improve the following deficits and impairments:  Abnormal gait, Decreased endurance, Decreased activity tolerance, Pain, Decreased strength, Difficulty walking, Decreased mobility, Decreased balance, Decreased range of motion, Decreased coordination, Impaired perceived functional ability, Improper body mechanics  Visit Diagnosis: Pain in left lower leg  Muscle weakness (generalized)  Pain in left hip  Difficulty in walking, not elsewhere classified     Problem List Patient Active Problem List   Diagnosis Date Noted   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, [place], [address] Phone: [phone]   Fax:  [phone]  Name: Oscar Sparks MRN: [id number] Date of Birth: [birth year]

## 2020-12-10 ENCOUNTER — Other Ambulatory Visit: Payer: Self-pay

## 2020-12-10 ENCOUNTER — Ambulatory Visit (INDEPENDENT_AMBULATORY_CARE_PROVIDER_SITE_OTHER): Payer: Medicare Other | Admitting: Physical Therapy

## 2020-12-10 DIAGNOSIS — M25552 Pain in left hip: Secondary | ICD-10-CM | POA: Diagnosis not present

## 2020-12-10 DIAGNOSIS — M6281 Muscle weakness (generalized): Secondary | ICD-10-CM | POA: Diagnosis not present

## 2020-12-10 DIAGNOSIS — M79662 Pain in left lower leg: Secondary | ICD-10-CM | POA: Diagnosis not present

## 2020-12-10 DIAGNOSIS — R262 Difficulty in walking, not elsewhere classified: Secondary | ICD-10-CM | POA: Diagnosis not present

## 2020-12-10 NOTE — Therapy (Signed)
[facility] Physical Therapy [address] [place], [place], [address] Phone: [phone]   Fax:  [phone]  Physical Therapy Treatment  Patient Details  Name: Oscar Sparks MRN: [id number] Date of Birth: [birth year] Referring Provider (PT): Dr. Erlinda Hong   Encounter Date: 12/10/2020   PT End of Session - 12/10/20 0929     Visit Number 30    Number of Visits 40    Date for PT Re-Evaluation 02/03/21    Progress Note Due on Visit 79    PT Start Time 0845    PT Stop Time 0930    PT Time Calculation (min) 45 min    Activity Tolerance Patient tolerated treatment well    Behavior During Therapy [facility] for tasks assessed/performed             Past Medical History:  Diagnosis Date   Arthritis    knees   Colon cancer ([place]) 09/24/2018   ED (erectile dysfunction)    Neuromuscular disorder ([place])    neuropathy in feet   Peripheral vascular disease ([place])    poor curculation in hands and feet hard to monitor with pulse oximetry   Prostate cancer [facility])    sees Dr. Gaynelle Arabian, received cesium seeds     Past Surgical History:  Procedure Laterality Date   [place]  2004   Dr. Sherwood Gambler   SUPERFICIAL PERONEAL NERVE RELEASE Left 06/23/2020   Procedure: left peroneal nerve neurolysis;  Surgeon: Leandrew Koyanagi, MD;  Location: [place];  Service: Orthopedics;  Laterality: Left;   TONSILLECTOMY      There were no vitals filed for this visit.   Subjective Assessment - 12/10/20 0923     Subjective He relays overall feeling as good as he can, he is ready to work today. Denies pain but would like to have more strength in his left ankle    Limitations Walking    Patient Stated Goals Walk without the cane    Pain Onset More than a month ago               [facility] Adult PT Treatment/Exercise - 12/10/20 0001       Ambulation/Gait   Ambulation/Gait Yes    Ambulation/Gait Assistance 5: Supervision    Gait Comments gait unsteadiness  with decreased Lt foot clearance, trendelenburg gait, one episode of needing min A to regain balance.      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 SLS on Lt 15 sec X5 with one fingertip support      Ankle Exercises: Aerobic   Recumbent Bike L5 X 10 min      Ankle Exercises: Supine   T-Band green 4 way X 20 ea on Lt      Ankle Exercises: Machines for Strengthening   Cybex Leg Press 100# 3X12, then SL 37# on Lt 3X10                       PT Short Term Goals -  11/11/20 1026       PT SHORT TERM GOAL #1   Title Patient will demonstrate independent use of home exercise program to maintain progress from in clinic treatments.    Baseline for current HEP    Status Achieved      PT SHORT TERM GOAL #2   Title he will hold SLS on Lt leg for 5 sec avg    Baseline 3 sec avg on 8/18    Time 4    Period Weeks    Status On-going    Target Date 10/26/20               PT Long Term Goals - 11/11/20 1024       PT LONG TERM GOAL #1   Title Patient will demonstrate/report pain at worst less than or equal to 2/10 to facilitate minimal limitation in daily activity secondary to pain symptoms.    Status Achieved      PT LONG TERM GOAL #2   Title Patient will demonstrate independent use of home exercise program to facilitate ability to maintain/progress functional gains from skilled physical therapy services.    Status Achieved      PT LONG TERM GOAL #3   Title Pt. will demonstrate FOTO outcome > 55 to indicated reduced disability due to condition.     Status Achieved      PT LONG TERM GOAL #4   Title Pt. will demonstrate bilateral hip MMT > or = 4/5, ankle DF 5/5 and EV to at least 4/5 to improve ambulation and reduce falls risk    Baseline hip grossly 4/5, lacking ankle EV strength only 3 to 3+    Status On-going      PT LONG TERM GOAL #5   Title Pt. will demonstrate BERG > 45 to indicate reduced fall risk.    Baseline 51 on 08/18/20    Status Achieved      PT LONG TERM GOAL #6   Title Pt. will demonstrate independent ambulation community distances.    Status Achieved      PT LONG TERM GOAL #7   Title He will be able to hold SLS on left for >8 seconds    Baseline 3 sec avg on 8/18    Time 8    Period Weeks    Status On-going                   Plan - 12/10/20 0929     Clinical Impression Statement He still has gait unsteadiness due to Lt ankle and hip weakness and foot drop. We are continuing to work to improve this and reduce his risk of falling. I do feel that he had improved quality of movement with his EV and DF during NMES stimulation.    Comorbidities 2020 Colon Cancer, Neuropathy in feet, PVD, prostate cancer.  Previously seen by clinic for Lt hip pain    Examination-Activity Limitations Squat;Bend;Stairs;Stand;Locomotion Level;Transfers    Examination-Participation Restrictions Community Activity;Shop;Yard Work    Product manager    Rehab Potential --   fair to good   PT Frequency 2x / week    PT Duration Other (comment)    PT Treatment/Interventions ADLs/Self Care Home Management;Electrical Stimulation;Iontophoresis '4mg'$ /ml Dexamethasone;Moist Heat;Balance training;Therapeutic exercise;Therapeutic activities;Functional mobility training;Stair training;Gait training;DME Instruction;Ultrasound;Neuromuscular re-education;Patient/family education;Passive range of motion;Dry needling;Spinal Manipulations;Joint Manipulations;Taping;Manual techniques    PT Next Visit Plan  Lt ankle strength and stability with focus on DF and EV, gait and balance,  Lt leg strength with focus on quads and hip abductors    PT Home Exercise Plan [id number]    Consulted and Agree with Plan of Care Patient             Patient will benefit from skilled therapeutic intervention in order to improve the following deficits and impairments:  Abnormal gait, Decreased endurance, Decreased activity tolerance, Pain, Decreased strength, Difficulty walking, Decreased mobility, Decreased balance, Decreased range of motion, Decreased coordination, Impaired perceived functional ability, Improper body mechanics  Visit Diagnosis: Pain in left lower leg  Muscle weakness (generalized)  Pain in left hip  Difficulty in walking, not elsewhere classified     Problem List Patient Active Problem List   Diagnosis Date Noted   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 [place], [place], [address] Phone: [phone]   Fax:  [phone]  Name: DYMOND EYE MRN: [id number] Date of Birth: 01-Nov-1940

## 2020-12-14 ENCOUNTER — Encounter: Payer: Self-pay | Admitting: Physical Therapy

## 2020-12-14 ENCOUNTER — Ambulatory Visit (INDEPENDENT_AMBULATORY_CARE_PROVIDER_SITE_OTHER): Payer: Medicare Other | Admitting: Physical Therapy

## 2020-12-14 ENCOUNTER — Other Ambulatory Visit: Payer: Self-pay

## 2020-12-14 DIAGNOSIS — R262 Difficulty in walking, not elsewhere classified: Secondary | ICD-10-CM

## 2020-12-14 DIAGNOSIS — M25552 Pain in left hip: Secondary | ICD-10-CM | POA: Diagnosis not present

## 2020-12-14 DIAGNOSIS — M6281 Muscle weakness (generalized): Secondary | ICD-10-CM

## 2020-12-14 DIAGNOSIS — M79662 Pain in left lower leg: Secondary | ICD-10-CM

## 2020-12-14 NOTE — Therapy (Signed)
[facility] Physical Therapy [address] [place] [facility] [place], [place], [address] Phone: [phone]   Fax:  [phone]  Physical Therapy Treatment  Patient Details  Name: Oscar Sparks MRN: [id number] Date of Birth: Jan 18, 1941 Referring Provider (PT): Dr. Erlinda Hong   Encounter Date: 12/14/2020   PT End of Session - 12/14/20 0940     Visit Number 31    Number of Visits 40    Date for PT Re-Evaluation 02/03/21    Progress Note Due on Visit 72    PT Start Time 0845    PT Stop Time 0934    PT Time Calculation (min) 49 min    Activity Tolerance Patient tolerated treatment well    Behavior During Therapy [facility] for tasks assessed/performed             Past Medical History:  Diagnosis Date   Arthritis    knees   Colon cancer ([place]) 09/24/2018   ED (erectile dysfunction)    Neuromuscular disorder ([place])    neuropathy in feet   Peripheral vascular disease ([place])    poor curculation in hands and feet hard to monitor with pulse oximetry   Prostate cancer [facility])    sees Dr. Gaynelle Arabian, received cesium seeds     Past Surgical History:  Procedure Laterality Date   [place]  2004   Dr. Sherwood Gambler   SUPERFICIAL PERONEAL NERVE RELEASE Left 06/23/2020   Procedure: left peroneal nerve neurolysis;  Surgeon: Leandrew Koyanagi, MD;  Location: [place];  Service: Orthopedics;  Laterality: Left;   TONSILLECTOMY      There were no vitals filed for this visit.   Subjective Assessment - 12/14/20 0902     Subjective His ankle EV movement is still not where he would like it to be but he is not having pain.    Limitations Walking    Patient Stated Goals Walk without the cane    Pain Onset More than a month ago                               [facility] Adult PT Treatment/Exercise - 12/14/20 0001       Ambulation/Gait   Ambulation/Gait Yes    Ambulation/Gait Assistance 5: Supervision    Ambulation Distance (Feet) 150 Feet    Gait  Comments gait unsteadiness with decreased Lt foot clearance, trendelenburg gait      Knee/Hip Exercises: Aerobic   Recumbent Bike L3 X10 min      Knee/Hip Exercises: Machines for Strengthening   Total Gym Leg Press 100# DL 3X10, then SL 37# 3X10      Electrical Stimulation   Electrical Stimulation Location Lt peroneals and anterior tib    Electrical Stimulation Action NMES/Russian    Electrical Stimulation Parameters 5/5 sec on/off with active DF and EV movements      Ankle Exercises: Stretches   Gastroc Stretch 3 reps;30 seconds      Ankle Exercises: Supine   T-Band green 4 way X 20 ea on Lt      Ankle Exercises: Machines for Strengthening   Cybex Leg Press 100# 3X12, then SL 37# on Lt 3X10      Ankle Exercises: Standing   Other Standing Ankle Exercises lateral walk red band around ankles 4 round trips, tandem walk with close guarding 3 round trips    Other Standing Ankle Exercises heel and toe raises 2X15. SLS  on Lt 15 sec X5 with one fingertip support                       PT Short Term Goals - 11/11/20 1026       PT SHORT TERM GOAL #1   Title Patient will demonstrate independent use of home exercise program to maintain progress from in clinic treatments.    Baseline for current HEP    Status Achieved      PT SHORT TERM GOAL #2   Title he will hold SLS on Lt leg for 5 sec avg    Baseline 3 sec avg on 8/18    Time 4    Period Weeks    Status On-going    Target Date 10/26/20               PT Long Term Goals - 11/11/20 1024       PT LONG TERM GOAL #1   Title Patient will demonstrate/report pain at worst less than or equal to 2/10 to facilitate minimal limitation in daily activity secondary to pain symptoms.    Status Achieved      PT LONG TERM GOAL #2   Title Patient will demonstrate independent use of home exercise program to facilitate ability to maintain/progress functional gains from skilled physical therapy services.    Status Achieved       PT LONG TERM GOAL #3   Title Pt. will demonstrate FOTO outcome > 55 to indicated reduced disability due to condition.    Status Achieved      PT LONG TERM GOAL #4   Title Pt. will demonstrate bilateral hip MMT > or = 4/5, ankle DF 5/5 and EV to at least 4/5 to improve ambulation and reduce falls risk    Baseline hip grossly 4/5, lacking ankle EV strength only 3 to 3+    Status On-going      PT LONG TERM GOAL #5   Title Pt. will demonstrate BERG > 45 to indicate reduced fall risk.    Baseline 51 on 08/18/20    Status Achieved      PT LONG TERM GOAL #6   Title Pt. will demonstrate independent ambulation community distances.    Status Achieved      PT LONG TERM GOAL #7   Title He will be able to hold SLS on left for >8 seconds    Baseline 3 sec avg on 8/18    Time 8    Period Weeks    Status On-going                   Plan - 12/14/20 0943     Clinical Impression Statement His balance and strength for EV and DF continue to be inconsistent and he has better days than others with this. He shows maximal effort with PT and we will continue to work to improve this as much as possible to maximize his functional abillities    Comorbidities 2020 Colon Cancer, Neuropathy in feet, PVD, prostate cancer.  Previously seen by clinic for Lt hip pain    Examination-Activity Limitations Squat;Bend;Stairs;Stand;Locomotion Level;Transfers    Examination-Participation Restrictions Community Activity;Shop;Yard Work    Product manager    Rehab Potential --   fair to good   PT Frequency 2x / week    PT Duration Other (comment)    PT Treatment/Interventions ADLs/Self Care Home Management;Electrical Stimulation;Iontophoresis 4mg /ml Dexamethasone;Moist Heat;Balance training;Therapeutic exercise;Therapeutic activities;Functional mobility training;Stair  training;Gait training;DME Instruction;Ultrasound;Neuromuscular re-education;Patient/family  education;Passive range of motion;Dry needling;Spinal Manipulations;Joint Manipulations;Taping;Manual techniques    PT Next Visit Plan Lt ankle strength and stability with focus on DF and EV, gait and balance, Lt leg strength with focus on quads and hip abductors    PT Home Exercise Plan [id number]    Consulted and Agree with Plan of Care Patient             Patient will benefit from skilled therapeutic intervention in order to improve the following deficits and impairments:  Abnormal gait, Decreased endurance, Decreased activity tolerance, Pain, Decreased strength, Difficulty walking, Decreased mobility, Decreased balance, Decreased range of motion, Decreased coordination, Impaired perceived functional ability, Improper body mechanics  Visit Diagnosis: Pain in left lower leg  Muscle weakness (generalized)  Pain in left hip  Difficulty in walking, not elsewhere classified     Problem List Patient Active Problem List   Diagnosis Date Noted   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 [place], [place], [address] Phone: [phone]   Fax:  [phone]  Name: Oscar Sparks MRN: [id number] Date of Birth: 1940/11/01

## 2020-12-16 ENCOUNTER — Other Ambulatory Visit: Payer: Self-pay

## 2020-12-16 ENCOUNTER — Ambulatory Visit (INDEPENDENT_AMBULATORY_CARE_PROVIDER_SITE_OTHER): Payer: Medicare Other | Admitting: Physical Therapy

## 2020-12-16 ENCOUNTER — Encounter: Payer: Self-pay | Admitting: Physical Therapy

## 2020-12-16 DIAGNOSIS — M25552 Pain in left hip: Secondary | ICD-10-CM | POA: Diagnosis not present

## 2020-12-16 DIAGNOSIS — R262 Difficulty in walking, not elsewhere classified: Secondary | ICD-10-CM | POA: Diagnosis not present

## 2020-12-16 DIAGNOSIS — M79662 Pain in left lower leg: Secondary | ICD-10-CM | POA: Diagnosis not present

## 2020-12-16 DIAGNOSIS — M6281 Muscle weakness (generalized): Secondary | ICD-10-CM

## 2020-12-16 NOTE — Therapy (Signed)
[facility] Physical Therapy [address] [place], [place], [address] Phone: [phone]   Fax:  [phone]  Physical Therapy Treatment  Patient Details  Name: Oscar Sparks MRN: [id number] Date of Birth: [birth year] Referring Provider (PT): Dr. Erlinda Hong   Encounter Date: 12/16/2020   PT End of Session - 12/16/20 0943     Visit Number 32    Number of Visits 40    Date for PT Re-Evaluation 02/03/21    Progress Note Due on Visit 67    PT Start Time 0837    PT Stop Time 0930    PT Time Calculation (min) 53 min    Activity Tolerance Patient tolerated treatment well    Behavior During Therapy [facility] for tasks assessed/performed             Past Medical History:  Diagnosis Date   Arthritis    knees   Colon cancer ([place]) 09/24/2018   ED (erectile dysfunction)    Neuromuscular disorder ([facility])    neuropathy in feet   Peripheral vascular disease ([place])    poor curculation in hands and feet hard to monitor with pulse oximetry   Prostate cancer [facility])    sees Dr. Gaynelle Arabian, received cesium seeds     Past Surgical History:  Procedure Laterality Date   [place]  2004   Dr. Sherwood Gambler   SUPERFICIAL PERONEAL NERVE RELEASE Left 06/23/2020   Procedure: left peroneal nerve neurolysis;  Surgeon: Leandrew Koyanagi, MD;  Location: [place];  Service: Orthopedics;  Laterality: Left;   TONSILLECTOMY      There were no vitals filed for this visit.   Subjective Assessment - 12/16/20 0939     Subjective he relays mild pain in his left hip due to OA about 4/10, he had to take NSAID for this today. He has no pain in his left ankle he is just concerned with the weakness    Limitations Walking    Patient Stated Goals Walk without the cane    Pain Onset More than a month ago                               [facility] Adult PT Treatment/Exercise - 12/16/20 0001       Ambulation/Gait   Ambulation/Gait Yes    Ambulation/Gait  Assistance 5: Supervision    Ambulation Distance (Feet) 150 Feet    Gait Comments gait unsteadiness with decreased Lt foot clearance, trendelenburg gait      Knee/Hip Exercises: Aerobic   Recumbent Bike L5 X10 min      Knee/Hip Exercises: Machines for Strengthening   Total Gym Leg Press 100# DL 3X10, then SL 37# 3X10      Electrical Stimulation   Electrical Stimulation Location Lt peroneals and anterior tib    Electrical Stimulation Action NMES/Russina 5/5 sec on/off with active DF and EV movements      Ankle Exercises: Standing   Other Standing Ankle Exercises lateral walk red band around ankles 4 round trips,Standing hip extensions red band 2X10 bilat, rocker board X40 lateral and X40 A-P    Other Standing Ankle Exercises heel and toe raises 2X15. SLS on Lt 15 sec X5 with one fingertip support.      Ankle Exercises: Stretches   Gastroc Stretch 3 reps;30 seconds      Ankle Exercises: Supine   T-Band green 4 way X 20 ea on  Lt                       PT Short Term Goals - 11/11/20 1026       PT SHORT TERM GOAL #1   Title Patient will demonstrate independent use of home exercise program to maintain progress from in clinic treatments.    Baseline for current HEP    Status Achieved      PT SHORT TERM GOAL #2   Title he will hold SLS on Lt leg for 5 sec avg    Baseline 3 sec avg on 8/18    Time 4    Period Weeks    Status On-going    Target Date 10/26/20               PT Long Term Goals - 11/11/20 1024       PT LONG TERM GOAL #1   Title Patient will demonstrate/report pain at worst less than or equal to 2/10 to facilitate minimal limitation in daily activity secondary to pain symptoms.    Status Achieved      PT LONG TERM GOAL #2   Title Patient will demonstrate independent use of home exercise program to facilitate ability to maintain/progress functional gains from skilled physical therapy services.    Status Achieved      PT LONG TERM GOAL #3    Title Pt. will demonstrate FOTO outcome > 55 to indicated reduced disability due to condition.    Status Achieved      PT LONG TERM GOAL #4   Title Pt. will demonstrate bilateral hip MMT > or = 4/5, ankle DF 5/5 and EV to at least 4/5 to improve ambulation and reduce falls risk    Baseline hip grossly 4/5, lacking ankle EV strength only 3 to 3+    Status On-going      PT LONG TERM GOAL #5   Title Pt. will demonstrate BERG > 45 to indicate reduced fall risk.    Baseline 51 on 08/18/20    Status Achieved      PT LONG TERM GOAL #6   Title Pt. will demonstrate independent ambulation community distances.    Status Achieved      PT LONG TERM GOAL #7   Title He will be able to hold SLS on left for >8 seconds    Baseline 3 sec avg on 8/18    Time 8    Period Weeks    Status On-going                   Plan - 12/16/20 0944     Clinical Impression Statement He continues to have severe Lt ankle and hip weakness. We continued to work to improve this with strength program and we are using Russian/NMES stimulation in efforts to improve more DF and EV contractions.    Comorbidities 2020 Colon Cancer, Neuropathy in feet, PVD, prostate cancer.  Previously seen by clinic for Lt hip pain    Examination-Activity Limitations Squat;Bend;Stairs;Stand;Locomotion Level;Transfers    Examination-Participation Restrictions Community Activity;Shop;Yard Work    Product manager    Rehab Potential --   fair to good   PT Frequency 2x / week    PT Duration Other (comment)    PT Treatment/Interventions ADLs/Self Care Home Management;Electrical Stimulation;Iontophoresis 4mg /ml Dexamethasone;Moist Heat;Balance training;Therapeutic exercise;Therapeutic activities;Functional mobility training;Stair training;Gait training;DME Instruction;Ultrasound;Neuromuscular re-education;Patient/family education;Passive range of motion;Dry needling;Spinal Manipulations;Joint  Manipulations;Taping;Manual techniques    PT Next  Visit Plan Lt ankle strength and stability with focus on DF and EV, gait and balance, Lt leg strength with focus on quads and hip abductors    PT Home Exercise Plan [id number]    Consulted and Agree with Plan of Care Patient             Patient will benefit from skilled therapeutic intervention in order to improve the following deficits and impairments:  Abnormal gait, Decreased endurance, Decreased activity tolerance, Pain, Decreased strength, Difficulty walking, Decreased mobility, Decreased balance, Decreased range of motion, Decreased coordination, Impaired perceived functional ability, Improper body mechanics  Visit Diagnosis: Pain in left lower leg  Muscle weakness (generalized)  Pain in left hip  Difficulty in walking, not elsewhere classified     Problem List Patient Active Problem List   Diagnosis Date Noted   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 [place], [place], [address] Phone: [phone]   Fax:  [phone]  Name: ELMON SHADER MRN: [id number] Date of Birth: 1940/09/28

## 2020-12-21 ENCOUNTER — Ambulatory Visit (INDEPENDENT_AMBULATORY_CARE_PROVIDER_SITE_OTHER): Payer: Medicare Other | Admitting: Physical Therapy

## 2020-12-21 ENCOUNTER — Encounter: Payer: Self-pay | Admitting: Physical Therapy

## 2020-12-21 ENCOUNTER — Other Ambulatory Visit: Payer: Self-pay

## 2020-12-21 DIAGNOSIS — M6281 Muscle weakness (generalized): Secondary | ICD-10-CM | POA: Diagnosis not present

## 2020-12-21 DIAGNOSIS — R262 Difficulty in walking, not elsewhere classified: Secondary | ICD-10-CM

## 2020-12-21 DIAGNOSIS — M79662 Pain in left lower leg: Secondary | ICD-10-CM | POA: Diagnosis not present

## 2020-12-21 DIAGNOSIS — M25552 Pain in left hip: Secondary | ICD-10-CM | POA: Diagnosis not present

## 2020-12-21 NOTE — Therapy (Signed)
[facility] Physical Therapy [address] [place], [place], [address] Phone: [phone]   Fax:  [phone]  Physical Therapy Treatment  Patient Details  Name: Oscar Sparks MRN: [id number] Date of Birth: 1940/05/23 Referring Provider (PT): Dr. Erlinda Hong   Encounter Date: 12/21/2020   PT End of Session - 12/21/20 1033     Visit Number 33    Number of Visits 40    Date for PT Re-Evaluation 02/03/21    Progress Note Due on Visit 12    PT Start Time 0845    PT Stop Time 0928    PT Time Calculation (min) 43 min    Activity Tolerance Patient tolerated treatment well    Behavior During Therapy [facility] for tasks assessed/performed             Past Medical History:  Diagnosis Date   Arthritis    knees   Colon cancer ([place]) 09/24/2018   ED (erectile dysfunction)    Neuromuscular disorder ([facility])    neuropathy in feet   Peripheral vascular disease ([place])    poor curculation in hands and feet hard to monitor with pulse oximetry   Prostate cancer [facility])    sees Dr. Gaynelle Arabian, received cesium seeds     Past Surgical History:  Procedure Laterality Date   [place]  2004   Dr. Sherwood Gambler   SUPERFICIAL PERONEAL NERVE RELEASE Left 06/23/2020   Procedure: left peroneal nerve neurolysis;  Surgeon: Leandrew Koyanagi, MD;  Location: [place];  Service: Orthopedics;  Laterality: Left;   TONSILLECTOMY      There were no vitals filed for this visit.   Subjective Assessment - 12/21/20 0907     Subjective relays he had 2 great days friday and saturday but then his hip locked up on him yesterday. He does notice some better movement into ankle EV today but its still weak    Limitations Walking    Patient Stated Goals Walk without the cane    Pain Onset More than a month ago                [facility] PT Assessment - 12/21/20 0001       Assessment   Medical Diagnosis Lt peroneal nerve decompression, foot drop    Referring Provider (PT)  Dr. Erlinda Hong    Onset Date/Surgical Date 06/23/20      Ambulation/Gait   Ambulation Distance (Feet) 150 Feet                           [facility] Adult PT Treatment/Exercise - 12/21/20 0001       Ambulation/Gait   Ambulation/Gait Yes    Ambulation/Gait Assistance 5: Supervision    Gait Comments gait unsteadiness with decreased Lt foot clearance, trendelenburg gait      Knee/Hip Exercises: Aerobic   Recumbent Bike L5 X10 min      Knee/Hip Exercises: Standing   Other Standing Knee Exercises TRX squats 2X10    Other Standing Knee Exercises hip marches and hip abduction X20 bilat with 4#      Electrical Stimulation   Electrical Stimulation Location Lt peroneals and anterior tib    Electrical Stimulation Action NMES/Russian    Electrical Stimulation Parameters 5/5 sec on/off with active ankle EV      Ankle Exercises: Standing   Other Standing Ankle Exercises heel and toe raises 2X15. SLS on Lt 15 sec X5 with one  fingertip support.      Ankle Exercises: Stretches   Gastroc Stretch 3 reps;30 seconds      Ankle Exercises: Supine   T-Band green 4 way X 20 ea on Lt                       PT Short Term Goals - 11/11/20 1026       PT SHORT TERM GOAL #1   Title Patient will demonstrate independent use of home exercise program to maintain progress from in clinic treatments.    Baseline for current HEP    Status Achieved      PT SHORT TERM GOAL #2   Title he will hold SLS on Lt leg for 5 sec avg    Baseline 3 sec avg on 8/18    Time 4    Period Weeks    Status On-going    Target Date 10/26/20               PT Long Term Goals - 11/11/20 1024       PT LONG TERM GOAL #1   Title Patient will demonstrate/report pain at worst less than or equal to 2/10 to facilitate minimal limitation in daily activity secondary to pain symptoms.    Status Achieved      PT LONG TERM GOAL #2   Title Patient will demonstrate independent use of home exercise program to  facilitate ability to maintain/progress functional gains from skilled physical therapy services.    Status Achieved      PT LONG TERM GOAL #3   Title Pt. will demonstrate FOTO outcome > 55 to indicated reduced disability due to condition.    Status Achieved      PT LONG TERM GOAL #4   Title Pt. will demonstrate bilateral hip MMT > or = 4/5, ankle DF 5/5 and EV to at least 4/5 to improve ambulation and reduce falls risk    Baseline hip grossly 4/5, lacking ankle EV strength only 3 to 3+    Status On-going      PT LONG TERM GOAL #5   Title Pt. will demonstrate BERG > 45 to indicate reduced fall risk.    Baseline 51 on 08/18/20    Status Achieved      PT LONG TERM GOAL #6   Title Pt. will demonstrate independent ambulation community distances.    Status Achieved      PT LONG TERM GOAL #7   Title He will be able to hold SLS on left for >8 seconds    Baseline 3 sec avg on 8/18    Time 8    Period Weeks    Status On-going                   Plan - 12/21/20 1035     Clinical Impression Statement He did have improved EV active ROM and contraction today both before and after NMES/Russian stimulation. We will continue to work to improve this as much as possbile to improve his gait and decreased his risk for falling.    Comorbidities 2020 Colon Cancer, Neuropathy in feet, PVD, prostate cancer.  Previously seen by clinic for Lt hip pain    Examination-Activity Limitations Squat;Bend;Stairs;Stand;Locomotion Level;Transfers    Examination-Participation Restrictions Community Activity;Shop;Yard Work    Product manager    Rehab Potential --   fair to good   PT Frequency 2x / week    PT Duration  Other (comment)    PT Treatment/Interventions ADLs/Self Care Home Management;Electrical Stimulation;Iontophoresis 4mg /ml Dexamethasone;Moist Heat;Balance training;Therapeutic exercise;Therapeutic activities;Functional mobility training;Stair  training;Gait training;DME Instruction;Ultrasound;Neuromuscular re-education;Patient/family education;Passive range of motion;Dry needling;Spinal Manipulations;Joint Manipulations;Taping;Manual techniques    PT Next Visit Plan Lt ankle strength and stability with focus on DF and EV, gait and balance, Lt leg strength with focus on quads and hip abductors    PT Home Exercise Plan [id number]    Consulted and Agree with Plan of Care Patient             Patient will benefit from skilled therapeutic intervention in order to improve the following deficits and impairments:  Abnormal gait, Decreased endurance, Decreased activity tolerance, Pain, Decreased strength, Difficulty walking, Decreased mobility, Decreased balance, Decreased range of motion, Decreased coordination, Impaired perceived functional ability, Improper body mechanics  Visit Diagnosis: Pain in left lower leg  Muscle weakness (generalized)  Pain in left hip  Difficulty in walking, not elsewhere classified     Problem List Patient Active Problem List   Diagnosis Date Noted   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   Cancer of ascending colon s/p robotic colectomy 11/20/2018 09/30/2018   Iron deficiency anemia 10/16/2016   Pain in left hip 06/25/2014   Piriformis syndrome of left side 06/25/2014   Prostate cancer ([place]) 11/15/2009   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

## 2020-12-23 ENCOUNTER — Encounter: Payer: Medicare Other | Admitting: Physical Therapy

## 2020-12-27 ENCOUNTER — Other Ambulatory Visit: Payer: Self-pay

## 2020-12-27 ENCOUNTER — Ambulatory Visit (INDEPENDENT_AMBULATORY_CARE_PROVIDER_SITE_OTHER): Payer: Medicare Other | Admitting: Physical Therapy

## 2020-12-27 DIAGNOSIS — M25552 Pain in left hip: Secondary | ICD-10-CM

## 2020-12-27 DIAGNOSIS — R262 Difficulty in walking, not elsewhere classified: Secondary | ICD-10-CM

## 2020-12-27 DIAGNOSIS — M79662 Pain in left lower leg: Secondary | ICD-10-CM

## 2020-12-27 DIAGNOSIS — M6281 Muscle weakness (generalized): Secondary | ICD-10-CM

## 2020-12-27 NOTE — Therapy (Signed)
[facility] Physical Therapy [address] [place], [place], [address] Phone: [phone]   Fax:  [phone]  Physical Therapy Treatment  Patient Details  Name: Oscar Sparks MRN: [id number] Date of Birth: 1940-08-16 Referring Provider (PT): Dr. Erlinda Hong   Encounter Date: 12/27/2020   PT End of Session - 12/27/20 0956     Visit Number 34    Number of Visits 40    Date for PT Re-Evaluation 02/03/21    Progress Note Due on Visit 62    PT Start Time 0920    PT Stop Time 1005    PT Time Calculation (min) 45 min    Activity Tolerance Patient tolerated treatment well    Behavior During Therapy [facility] for tasks assessed/performed             Past Medical History:  Diagnosis Date   Arthritis    knees   Colon cancer ([place]) 09/24/2018   ED (erectile dysfunction)    Neuromuscular disorder (HCC)    neuropathy in feet   Peripheral vascular disease ([place])    poor curculation in hands and feet hard to monitor with pulse oximetry   Prostate cancer [facility])    sees Dr. Gaynelle Arabian, received cesium seeds     Past Surgical History:  Procedure Laterality Date   [place]  2004   Dr. Sherwood Gambler   SUPERFICIAL PERONEAL NERVE RELEASE Left 06/23/2020   Procedure: left peroneal nerve neurolysis;  Surgeon: Leandrew Koyanagi, MD;  Location: [place];  Service: Orthopedics;  Laterality: Left;   TONSILLECTOMY      There were no vitals filed for this visit.   Subjective Assessment - 12/27/20 0945     Subjective relays about the same today, has not noticed any more improvments in his ankle since last week but is optimistic still. He has been doing exercises at home and they have loosened up his hip some.    Limitations Walking    Patient Stated Goals Walk without the cane    Currently in Pain? No/denies    Pain Onset More than a month ago             [facility] Adult PT Treatment/Exercise - 12/27/20 0001       Ambulation/Gait   Ambulation/Gait  Yes    Ambulation/Gait Assistance 5: Supervision    Ambulation Distance (Feet) 150 Feet    Gait Comments gait unsteadiness with decreased Lt foot clearance, trendelenburg gait      Knee/Hip Exercises: Aerobic   Nustep L6 X10 min      Knee/Hip Exercises: Machines for Strengthening   Total Gym Leg Press 106# DL 3X10, then SL 43# 3X10      Knee/Hip Exercises: Standing   Other Standing Knee Exercises hip marches and hip abduction X20 bilat with 4#      Electrical Stimulation   Electrical Stimulation Location Lt peroneals and anterior tib    Electrical Stimulation Action NMES/Russian    Electrical Stimulation Parameters 5/5 sec on/off 10 min with active DF and EV movements      Ankle Exercises: Standing   Other Standing Ankle Exercises heel and toe raises 2X15. SLS on Lt 15 sec X5 with one fingertip support.      Ankle Exercises: Stretches   Gastroc Stretch 3 reps;30 seconds      Ankle Exercises: Supine   T-Band green 4 way X 20 ea on Lt  PT Short Term Goals - 11/11/20 1026       PT SHORT TERM GOAL #1   Title Patient will demonstrate independent use of home exercise program to maintain progress from in clinic treatments.    Baseline for current HEP    Status Achieved      PT SHORT TERM GOAL #2   Title he will hold SLS on Lt leg for 5 sec avg    Baseline 3 sec avg on 8/18    Time 4    Period Weeks    Status On-going    Target Date 10/26/20               PT Long Term Goals - 11/11/20 1024       PT LONG TERM GOAL #1   Title Patient will demonstrate/report pain at worst less than or equal to 2/10 to facilitate minimal limitation in daily activity secondary to pain symptoms.    Status Achieved      PT LONG TERM GOAL #2   Title Patient will demonstrate independent use of home exercise program to facilitate ability to maintain/progress functional gains from skilled physical therapy services.    Status Achieved      PT LONG TERM  GOAL #3   Title Pt. will demonstrate FOTO outcome > 55 to indicated reduced disability due to condition.    Status Achieved      PT LONG TERM GOAL #4   Title Pt. will demonstrate bilateral hip MMT > or = 4/5, ankle DF 5/5 and EV to at least 4/5 to improve ambulation and reduce falls risk    Baseline hip grossly 4/5, lacking ankle EV strength only 3 to 3+    Status On-going      PT LONG TERM GOAL #5   Title Pt. will demonstrate BERG > 45 to indicate reduced fall risk.    Baseline 51 on 08/18/20    Status Achieved      PT LONG TERM GOAL #6   Title Pt. will demonstrate independent ambulation community distances.    Status Achieved      PT LONG TERM GOAL #7   Title He will be able to hold SLS on left for >8 seconds    Baseline 3 sec avg on 8/18    Time 8    Period Weeks    Status On-going                   Plan - 12/27/20 1000     Clinical Impression Statement Gait did appear a little more steady today however it does get more unsteady after he is fatigued. He is not having pain but does still lack Lt ankle strength for EV and DF causing foot drop and still places him at an increased risk for falling. Bracing may be beneficial in future if this does not improve more with PT.    Comorbidities 2020 Colon Cancer, Neuropathy in feet, PVD, prostate cancer.  Previously seen by clinic for Lt hip pain    Examination-Activity Limitations Squat;Bend;Stairs;Stand;Locomotion Level;Transfers    Examination-Participation Restrictions Community Activity;Shop;Yard Work    Product manager    Rehab Potential --   fair to good   PT Frequency 2x / week    PT Duration Other (comment)    PT Treatment/Interventions ADLs/Self Care Home Management;Electrical Stimulation;Iontophoresis 4mg /ml Dexamethasone;Moist Heat;Balance training;Therapeutic exercise;Therapeutic activities;Functional mobility training;Stair training;Gait training;DME  Instruction;Ultrasound;Neuromuscular re-education;Patient/family education;Passive range of motion;Dry needling;Spinal Manipulations;Joint Manipulations;Taping;Manual techniques  PT Next Visit Plan Lt ankle strength and stability with focus on DF and EV, gait and balance, Lt leg strength with focus on quads and hip abductors    PT Home Exercise Plan [id number]    Consulted and Agree with Plan of Care Patient             Patient will benefit from skilled therapeutic intervention in order to improve the following deficits and impairments:  Abnormal gait, Decreased endurance, Decreased activity tolerance, Pain, Decreased strength, Difficulty walking, Decreased mobility, Decreased balance, Decreased range of motion, Decreased coordination, Impaired perceived functional ability, Improper body mechanics  Visit Diagnosis: Pain in left lower leg  Muscle weakness (generalized)  Pain in left hip  Difficulty in walking, not elsewhere classified     Problem List Patient Active Problem List   Diagnosis Date Noted   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 [place], [place], [address] Phone: [phone]   Fax:  [phone]  Name: Oscar Sparks MRN: [id number] Date of Birth: 12-30-1940

## 2020-12-29 ENCOUNTER — Encounter: Payer: Medicare Other | Admitting: Physical Therapy

## 2021-01-14 ENCOUNTER — Ambulatory Visit (INDEPENDENT_AMBULATORY_CARE_PROVIDER_SITE_OTHER): Payer: Medicare Other | Admitting: Physical Therapy

## 2021-01-14 ENCOUNTER — Other Ambulatory Visit: Payer: Self-pay

## 2021-01-14 DIAGNOSIS — M6281 Muscle weakness (generalized): Secondary | ICD-10-CM | POA: Diagnosis not present

## 2021-01-14 DIAGNOSIS — M79662 Pain in left lower leg: Secondary | ICD-10-CM

## 2021-01-14 DIAGNOSIS — R262 Difficulty in walking, not elsewhere classified: Secondary | ICD-10-CM | POA: Diagnosis not present

## 2021-01-14 DIAGNOSIS — M25552 Pain in left hip: Secondary | ICD-10-CM

## 2021-01-14 NOTE — Therapy (Signed)
[facility] Physical Therapy [address] [place], [place], [address] Phone: [phone]   Fax:  [phone]  Physical Therapy Treatment/Progress note Progress Note reporting period date 11/04/20 to 01/14/21  See below for objective and subjective measurements relating to patients progress with PT.   Patient Details  Name: Oscar Sparks MRN: [id number] Date of Birth: Aug 09, 1940 Referring Provider (PT): Dr. Erlinda Hong   Encounter Date: 01/14/2021   PT End of Session - 01/14/21 0845     Visit Number 35    Number of Visits 40    Date for PT Re-Evaluation 02/03/21    Progress Note Due on Visit 82    PT Start Time 0800    PT Stop Time 0846    PT Time Calculation (min) 46 min    Activity Tolerance Patient tolerated treatment well    Behavior During Therapy [facility] for tasks assessed/performed             Past Medical History:  Diagnosis Date   Arthritis    knees   Colon cancer ([place]) 09/24/2018   ED (erectile dysfunction)    Neuromuscular disorder (HCC)    neuropathy in feet   Peripheral vascular disease ([place])    poor curculation in hands and feet hard to monitor with pulse oximetry   Prostate cancer [facility])    sees Dr. Gaynelle Arabian, received cesium seeds     Past Surgical History:  Procedure Laterality Date   [place]  2004   Dr. Sherwood Gambler   SUPERFICIAL PERONEAL NERVE RELEASE Left 06/23/2020   Procedure: left peroneal nerve neurolysis;  Surgeon: Leandrew Koyanagi, MD;  Location: [place];  Service: Orthopedics;  Laterality: Left;   TONSILLECTOMY      There were no vitals filed for this visit.   Subjective Assessment - 01/14/21 0847     Subjective relays he feels he is close to waking up his peroneal muscles but cant quite get there.    Limitations Walking    Patient Stated Goals Walk without the cane    Currently in Pain? No/denies    Pain Onset More than a month ago                [facility] PT Assessment -  01/14/21 0001       Assessment   Medical Diagnosis Lt peroneal nerve decompression, foot drop    Referring Provider (PT) Dr. Erlinda Hong    Onset Date/Surgical Date 06/23/20      Functional Tests   Functional tests Other      Other:   Other/ Comments tandem balance 15 sec avg      ROM / Strength   AROM / PROM / Strength Strength      Strength   Left Ankle Dorsiflexion 4+/5    Left Ankle Plantar Flexion 4+/5    Left Ankle Inversion 4-/5    Left Ankle Eversion 3+/5      Ambulation/Gait   Ambulation/Gait Yes    Ambulation/Gait Assistance 5: Supervision    Ambulation Distance (Feet) 300 Feet    Gait Comments gait unsteadiness with decreased Lt foot clearance, trendelenburg gait ,he has changed shoes from max cushion hoka shoe to lightweight sketchers and this appears to improve foot clearance some.                           [place] Adult PT Treatment/Exercise - 01/14/21 0001  Knee/Hip Exercises: Aerobic   Recumbent Bike L5 X10 min      Knee/Hip Exercises: Machines for Strengthening   Total Gym Leg Press 106# DL 3X10, then SL 50# 3X10      Electrical Stimulation   Electrical Stimulation Location Lt peroneals and anterior tib    Electrical Stimulation Action NMES/Russian    Electrical Stimulation Parameters 5/5 sec on/off X 6 min in SL with active EV      Ankle Exercises: Standing   Other Standing Ankle Exercises heel and toe raises 2X15. tandem stance 30 sec X3 bilat, lateral walking 3 round trips with red      Ankle Exercises: Stretches   Gastroc Stretch 3 reps;30 seconds      Ankle Exercises: Supine   T-Band green 4 way X 20 ea on Lt                       PT Short Term Goals - 01/14/21 0850       PT SHORT TERM GOAL #1   Title Patient will demonstrate independent use of home exercise program to maintain progress from in clinic treatments.    Baseline for current HEP, will progress as able    Time 4    Period Weeks    Status On-going     Target Date 01/14/21      PT SHORT TERM GOAL #2   Title he will hold SLS on Lt leg for 5 sec avg    Baseline 3 second avg or must have UE support    Time 4    Period Weeks    Status On-going    Target Date 01/14/21               PT Long Term Goals - 01/14/21 0851       PT LONG TERM GOAL #1   Title Patient will demonstrate/report pain at worst less than or equal to 2/10 to facilitate minimal limitation in daily activity secondary to pain symptoms.    Time 10    Period Weeks    Status Achieved    Target Date 02/14/21      PT LONG TERM GOAL #2   Title Patient will demonstrate independent use of home exercise program to facilitate ability to maintain/progress functional gains from skilled physical therapy services.    Baseline for current HEP, will need to be updated by end of PT    Time 10    Period Weeks    Status Partially Met      PT LONG TERM GOAL #3   Title Pt. will demonstrate FOTO outcome > 55 to indicated reduced disability due to condition.    Time 10    Period Weeks    Status Achieved      PT LONG TERM GOAL #4   Title Pt. will demonstrate bilateral hip MMT > or = 4/5, ankle DF 5/5 bilateral to facilitate stability in ambulation, transfers independently.    Baseline hip grossly 4/5, lacking ankle EV strength to 3+    Time 10    Period Weeks    Status Partially Met      PT LONG TERM GOAL #5   Title Pt. will demonstrate BERG > 45 to indicate reduced fall risk.    Baseline 51 on 08/18/20    Status Achieved      PT LONG TERM GOAL #6   Title Pt. will demonstrate independent ambulation community distances.    Status Achieved  PT LONG TERM GOAL #7   Title He will be able to hold SLS on left for >8 seconds    Time 8    Period Weeks    Status New                   Plan - 01/14/21 0853     Clinical Impression Statement Progress note reflects continued slow progress made but his recovery from this surgery can take up to a year. He has met  some of his PT goal but not others due to continued EV and DF and hip weakness which places him at a risk of falling. PT remains medically necessary to improve his quality of life and decrease his falls risk.    Comorbidities 2020 Colon Cancer, Neuropathy in feet, PVD, prostate cancer.  Previously seen by clinic for Lt hip pain    Examination-Activity Limitations Squat;Bend;Stairs;Stand;Locomotion Level;Transfers    Examination-Participation Restrictions Community Activity;Shop;Yard Work    Product manager    Rehab Potential --   fair to good   PT Frequency 2x / week    PT Duration Other (comment)    PT Treatment/Interventions ADLs/Self Care Home Management;Electrical Stimulation;Iontophoresis 55m/ml Dexamethasone;Moist Heat;Balance training;Therapeutic exercise;Therapeutic activities;Functional mobility training;Stair training;Gait training;DME Instruction;Ultrasound;Neuromuscular re-education;Patient/family education;Passive range of motion;Dry needling;Spinal Manipulations;Joint Manipulations;Taping;Manual techniques    PT Next Visit Plan continue with NMES for EV activation, Lt ankle strength and stability with focus on DF and EV, gait and balance, Lt leg strength with focus on quads and hip abductors    PT Home Exercise Plan [id number]    Consulted and Agree with Plan of Care Patient             Patient will benefit from skilled therapeutic intervention in order to improve the following deficits and impairments:  Abnormal gait, Decreased endurance, Decreased activity tolerance, Pain, Decreased strength, Difficulty walking, Decreased mobility, Decreased balance, Decreased range of motion, Decreased coordination, Impaired perceived functional ability, Improper body mechanics  Visit Diagnosis: Pain in left lower leg  Muscle weakness (generalized)  Pain in left hip  Difficulty in walking, not elsewhere classified     Problem  List Patient Active Problem List   Diagnosis Date Noted   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   Cancer of ascending colon s/p robotic colectomy 11/20/2018 09/30/2018   Iron deficiency anemia 10/16/2016   Pain in left hip 06/25/2014   Piriformis syndrome of left side 06/25/2014   [place] 11/15/2009   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

## 2021-01-18 ENCOUNTER — Encounter: Payer: Self-pay | Admitting: Physical Therapy

## 2021-01-18 ENCOUNTER — Other Ambulatory Visit: Payer: Self-pay

## 2021-01-18 ENCOUNTER — Ambulatory Visit (INDEPENDENT_AMBULATORY_CARE_PROVIDER_SITE_OTHER): Payer: Medicare Other | Admitting: Physical Therapy

## 2021-01-18 DIAGNOSIS — M25552 Pain in left hip: Secondary | ICD-10-CM | POA: Diagnosis not present

## 2021-01-18 DIAGNOSIS — R262 Difficulty in walking, not elsewhere classified: Secondary | ICD-10-CM | POA: Diagnosis not present

## 2021-01-18 DIAGNOSIS — M6281 Muscle weakness (generalized): Secondary | ICD-10-CM | POA: Diagnosis not present

## 2021-01-18 DIAGNOSIS — M79662 Pain in left lower leg: Secondary | ICD-10-CM

## 2021-01-18 NOTE — Therapy (Signed)
[facility] Physical Therapy [address] [place], [place], [address] Phone: [phone]   Fax:  [phone]  Physical Therapy Treatment  Patient Details  Name: Oscar Sparks MRN: [id number] Date of Birth: 1940-04-30 Referring Provider (PT): Dr. Erlinda Hong   Encounter Date: 01/18/2021   PT End of Session - 01/18/21 0925     Visit Number 37    Number of Visits 40    Date for PT Re-Evaluation 02/03/21    Progress Note Due on Visit 41    PT Start Time 0840    PT Stop Time 0927    PT Time Calculation (min) 47 min    Activity Tolerance Patient tolerated treatment well    Behavior During Therapy [facility] for tasks assessed/performed             Past Medical History:  Diagnosis Date   Arthritis    knees   Colon cancer ([place]) 09/24/2018   ED (erectile dysfunction)    Neuromuscular disorder (HCC)    neuropathy in feet   Peripheral vascular disease ([place])    poor curculation in hands and feet hard to monitor with pulse oximetry   Prostate cancer [facility])    sees Dr. Gaynelle Arabian, received cesium seeds     Past Surgical History:  Procedure Laterality Date   [place]  2004   Dr. Sherwood Gambler   SUPERFICIAL PERONEAL NERVE RELEASE Left 06/23/2020   Procedure: left peroneal nerve neurolysis;  Surgeon: Leandrew Koyanagi, MD;  Location: [place];  Service: Orthopedics;  Laterality: Left;   TONSILLECTOMY      There were no vitals filed for this visit.   Subjective Assessment - 01/18/21 0920     Subjective relays he is still waiting for the day his nerve wakes up, he remains optimistic, he denies pain.    Limitations Walking    Patient Stated Goals Walk without the cane    Pain Onset More than a month ago                               [facility] Adult PT Treatment/Exercise - 01/18/21 0001       Ambulation/Gait   Ambulation/Gait Yes    Ambulation/Gait Assistance 5: Supervision    Gait Comments gait unsteadiness with  decreased Lt foot clearance, trendelenburg gait ,he had one episode of LOB requiring mod A to prevent fall.      Knee/Hip Exercises: Aerobic   Recumbent Bike L5 X11 min      Knee/Hip Exercises: Machines for Strengthening   Total Gym Leg Press 112# DL 3X10, then SL 56# 3X10      Electrical Stimulation   Electrical Stimulation Location Lt peroneals and anterior tib    Electrical Stimulation Action NMES/Russian    Electrical Stimulation Parameters 5/5 on off for 8 min with active EV      Ankle Exercises: Standing   Other Standing Ankle Exercises heel and toe raises 2X15. tandem walk with one UE support 3 round trips,, lateral walking 3 round trips with red      Ankle Exercises: Stretches   Gastroc Stretch 3 reps;30 seconds    Slant Board Stretch --   slantboard     Ankle Exercises: Supine   T-Band green 4 way X 20 ea on Lt but used red on EV  PT Short Term Goals - 01/14/21 0850       PT SHORT TERM GOAL #1   Title Patient will demonstrate independent use of home exercise program to maintain progress from in clinic treatments.    Baseline for current HEP, will progress as able    Time 4    Period Weeks    Status On-going    Target Date 01/14/21      PT SHORT TERM GOAL #2   Title he will hold SLS on Lt leg for 5 sec avg    Baseline 3 second avg or must have UE support    Time 4    Period Weeks    Status On-going    Target Date 01/14/21               PT Long Term Goals - 01/14/21 0851       PT LONG TERM GOAL #1   Title Patient will demonstrate/report pain at worst less than or equal to 2/10 to facilitate minimal limitation in daily activity secondary to pain symptoms.    Time 10    Period Weeks    Status Achieved    Target Date 02/14/21      PT LONG TERM GOAL #2   Title Patient will demonstrate independent use of home exercise program to facilitate ability to maintain/progress functional gains from skilled physical therapy  services.    Baseline for current HEP, will need to be updated by end of PT    Time 10    Period Weeks    Status Partially Met      PT LONG TERM GOAL #3   Title Pt. will demonstrate FOTO outcome > 55 to indicated reduced disability due to condition.    Time 10    Period Weeks    Status Achieved      PT LONG TERM GOAL #4   Title Pt. will demonstrate bilateral hip MMT > or = 4/5, ankle DF 5/5 bilateral to facilitate stability in ambulation, transfers independently.    Baseline hip grossly 4/5, lacking ankle EV strength to 3+    Time 10    Period Weeks    Status Partially Met      PT LONG TERM GOAL #5   Title Pt. will demonstrate BERG > 45 to indicate reduced fall risk.    Baseline 51 on 08/18/20    Status Achieved      PT LONG TERM GOAL #6   Title Pt. will demonstrate independent ambulation community distances.    Status Achieved      PT LONG TERM GOAL #7   Title He will be able to hold SLS on left for >8 seconds    Time 8    Period Weeks    Status New                   Plan - 01/18/21 0929     Clinical Impression Statement We are continuing to use max effort to strengthen his peroneals and foot everters, and hip abductor, and improve his balance to decrease his risk of falling and improve his functional standing activiity. He did have one LOB today requiring mod A to keep from falling. PT recommending it is medically necessary to Continue current POC    Comorbidities 2020 Colon Cancer, Neuropathy in feet, PVD, prostate cancer.  Previously seen by clinic for Lt hip pain    Examination-Activity Limitations Squat;Bend;Stairs;Stand;Locomotion Level;Transfers    Examination-Participation Restrictions Community Activity;Shop;Yard Work  Stability/Clinical Decision Making Evolving/Moderate complexity    Rehab Potential --   fair to good   PT Frequency 2x / week    PT Duration Other (comment)    PT Treatment/Interventions ADLs/Self Care Home Management;Electrical  Stimulation;Iontophoresis 65m/ml Dexamethasone;Moist Heat;Balance training;Therapeutic exercise;Therapeutic activities;Functional mobility training;Stair training;Gait training;DME Instruction;Ultrasound;Neuromuscular re-education;Patient/family education;Passive range of motion;Dry needling;Spinal Manipulations;Joint Manipulations;Taping;Manual techniques    PT Next Visit Plan continue with NMES for EV activation, Lt ankle strength and stability with focus on DF and EV, gait and balance, Lt leg strength with focus on quads and hip abductors    PT Home Exercise Plan [id number]    Consulted and Agree with Plan of Care Patient             Patient will benefit from skilled therapeutic intervention in order to improve the following deficits and impairments:  Abnormal gait, Decreased endurance, Decreased activity tolerance, Pain, Decreased strength, Difficulty walking, Decreased mobility, Decreased balance, Decreased range of motion, Decreased coordination, Impaired perceived functional ability, Improper body mechanics  Visit Diagnosis: Pain in left lower leg  Muscle weakness (generalized)  Pain in left hip  Difficulty in walking, not elsewhere classified     Problem List Patient Active Problem List   Diagnosis Date Noted   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

## 2021-01-20 ENCOUNTER — Other Ambulatory Visit: Payer: Self-pay

## 2021-01-20 ENCOUNTER — Ambulatory Visit (INDEPENDENT_AMBULATORY_CARE_PROVIDER_SITE_OTHER): Payer: Medicare Other | Admitting: Physical Therapy

## 2021-01-20 DIAGNOSIS — R262 Difficulty in walking, not elsewhere classified: Secondary | ICD-10-CM

## 2021-01-20 DIAGNOSIS — M79662 Pain in left lower leg: Secondary | ICD-10-CM | POA: Diagnosis not present

## 2021-01-20 DIAGNOSIS — M25552 Pain in left hip: Secondary | ICD-10-CM

## 2021-01-20 DIAGNOSIS — M6281 Muscle weakness (generalized): Secondary | ICD-10-CM

## 2021-01-20 NOTE — Therapy (Signed)
[facility] Physical Therapy [address] [place], [place], [address] Phone: [phone]   Fax:  [phone]  Physical Therapy Treatment  Patient Details  Name: Oscar Sparks MRN: [id number] Date of Birth: 1940/12/05 Referring Provider (PT): Dr. Erlinda Hong   Encounter Date: 01/20/2021   PT End of Session - 01/20/21 0958     Visit Number 38    Number of Visits 40    Date for PT Re-Evaluation 02/03/21    Progress Note Due on Visit 5    PT Start Time 0845    PT Stop Time 0933    PT Time Calculation (min) 48 min    Activity Tolerance Patient tolerated treatment well    Behavior During Therapy [facility] for tasks assessed/performed             Past Medical History:  Diagnosis Date   Arthritis    knees   Colon cancer ([place]) 09/24/2018   ED (erectile dysfunction)    Neuromuscular disorder (HCC)    neuropathy in feet   Peripheral vascular disease ([place])    poor curculation in hands and feet hard to monitor with pulse oximetry   Prostate cancer [facility])    sees Dr. Gaynelle Arabian, received cesium seeds     Past Surgical History:  Procedure Laterality Date   [place]  2004   Dr. Sherwood Gambler   SUPERFICIAL PERONEAL NERVE RELEASE Left 06/23/2020   Procedure: left peroneal nerve neurolysis;  Surgeon: Leandrew Koyanagi, MD;  Location: [place];  Service: Orthopedics;  Laterality: Left;   TONSILLECTOMY      There were no vitals filed for this visit.   Subjective Assessment - 01/20/21 0954     Subjective relays he was sore after last workout but it is fine now, he wants to continue to be progressed. Denies overall pain but still with weakness and maybe has a little more EV ROM today noticed.    Limitations Walking    Patient Stated Goals Walk without the cane    Pain Onset More than a month ago               [facility] Adult PT Treatment/Exercise - 01/20/21 0001       Ambulation/Gait   Ambulation/Gait Yes    Ambulation/Gait  Assistance 5: Supervision    Ambulation Distance (Feet) 180 Feet    Gait Comments gait unsteadiness with decreased Lt foot clearance, trendelenburg gait ,he had one episode of LOB observed by receptionist upon him leaving session but did not fall      Knee/Hip Exercises: Aerobic   Nustep L6 X10  min seat 12      Knee/Hip Exercises: Machines for Strengthening   Total Gym Leg Press 112# DL 3X10, then SL 56# 3X10      Electrical Stimulation   Electrical Stimulation Location Lt peroneals and anterior tib    Electrical Stimulation Action NMES/Russian    Electrical Stimulation Parameters 5/5 sec on/off for 8 min with active DF and EV movements while on      Ankle Exercises: Standing   Other Standing Ankle Exercises heel and toe raises 2X15. monster walk with one UE support 3 round trips with red band,, lateral walking 3 round trips with red band no UE support. Balance in bars on rockerboard with intermit UE support PRN 2 min A-P, 2 min lateral      Ankle Exercises: Stretches   Gastroc Stretch 3 reps;30 seconds    Slant  Board Stretch --   slantboard     Ankle Exercises: Supine   T-Band green 4 way X 20 ea on Lt but used red on EV                       PT Short Term Goals - 01/14/21 0850       PT SHORT TERM GOAL #1   Title Patient will demonstrate independent use of home exercise program to maintain progress from in clinic treatments.    Baseline for current HEP, will progress as able    Time 4    Period Weeks    Status On-going    Target Date 01/14/21      PT SHORT TERM GOAL #2   Title he will hold SLS on Lt leg for 5 sec avg    Baseline 3 second avg or must have UE support    Time 4    Period Weeks    Status On-going    Target Date 01/14/21               PT Long Term Goals - 01/14/21 0851       PT LONG TERM GOAL #1   Title Patient will demonstrate/report pain at worst less than or equal to 2/10 to facilitate minimal limitation in daily activity  secondary to pain symptoms.    Time 10    Period Weeks    Status Achieved    Target Date 02/14/21      PT LONG TERM GOAL #2   Title Patient will demonstrate independent use of home exercise program to facilitate ability to maintain/progress functional gains from skilled physical therapy services.    Baseline for current HEP, will need to be updated by end of PT    Time 10    Period Weeks    Status Partially Met      PT LONG TERM GOAL #3   Title Pt. will demonstrate FOTO outcome > 55 to indicated reduced disability due to condition.    Time 10    Period Weeks    Status Achieved      PT LONG TERM GOAL #4   Title Pt. will demonstrate bilateral hip MMT > or = 4/5, ankle DF 5/5 bilateral to facilitate stability in ambulation, transfers independently.    Baseline hip grossly 4/5, lacking ankle EV strength to 3+    Time 10    Period Weeks    Status Partially Met      PT LONG TERM GOAL #5   Title Pt. will demonstrate BERG > 45 to indicate reduced fall risk.    Baseline 51 on 08/18/20    Status Achieved      PT LONG TERM GOAL #6   Title Pt. will demonstrate independent ambulation community distances.    Status Achieved      PT LONG TERM GOAL #7   Title He will be able to hold SLS on left for >8 seconds    Time 8    Period Weeks    Status New                   Plan - 01/20/21 1000     Clinical Impression Statement Session continued to focus on decreasing his risk of falling by improving Lt ankle and hip strength and working on balance activities. He appeared a little more steady today during sesssion however then after session receptionist witnessed a near fall due to  loss of balance but he did not fall. PT recommending to continue PT.    Comorbidities 2020 Colon Cancer, Neuropathy in feet, PVD, prostate cancer.  Previously seen by clinic for Lt hip pain    Examination-Activity Limitations Squat;Bend;Stairs;Stand;Locomotion Level;Transfers    Examination-Participation  Restrictions Community Activity;Shop;Yard Work    Product manager    Rehab Potential --   fair to good   PT Frequency 2x / week    PT Duration Other (comment)    PT Treatment/Interventions ADLs/Self Care Home Management;Electrical Stimulation;Iontophoresis 46m/ml Dexamethasone;Moist Heat;Balance training;Therapeutic exercise;Therapeutic activities;Functional mobility training;Stair training;Gait training;DME Instruction;Ultrasound;Neuromuscular re-education;Patient/family education;Passive range of motion;Dry needling;Spinal Manipulations;Joint Manipulations;Taping;Manual techniques    PT Next Visit Plan continue with NMES for EV activation, Lt ankle strength and stability with focus on DF and EV, gait and balance, Lt leg strength with focus on quads and hip abductors    PT Home Exercise Plan [id number]    Consulted and Agree with Plan of Care Patient             Patient will benefit from skilled therapeutic intervention in order to improve the following deficits and impairments:  Abnormal gait, Decreased endurance, Decreased activity tolerance, Pain, Decreased strength, Difficulty walking, Decreased mobility, Decreased balance, Decreased range of motion, Decreased coordination, Impaired perceived functional ability, Improper body mechanics  Visit Diagnosis: Pain in left lower leg  Muscle weakness (generalized)  Pain in left hip  Difficulty in walking, not elsewhere classified     Problem List Patient Active Problem List   Diagnosis Date Noted   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

## 2021-01-30 IMAGING — MR MR LUMBAR SPINE W/O CM
4 of 5 series · 27 of 48 positions shown · non-contrast
Comparison: Lumbar spine radiographs 01/29/2020.

CLINICAL DATA: Low back pain with left leg weakness. History of
melanoma and colon cancer

EXAM:
MRI LUMBAR SPINE WITHOUT CONTRAST
TECHNIQUE: Multiplanar, multisequence MR imaging of the lumbar spine was
performed. No intravenous contrast was administered.

[Series 3: T2 · sagittal · 4.0mm · 1.09mm/px · 6 of 17 slices shown (1 of 2)]
[im 1/17]
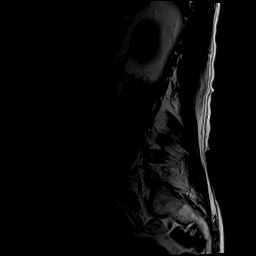
[im 4/17]
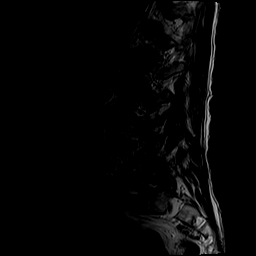
[im 7/17]
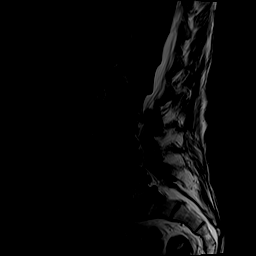
[im 10/17]
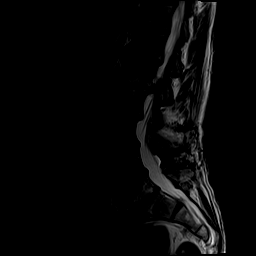
[im 13/17]
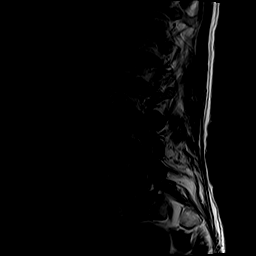
[im 17/17]
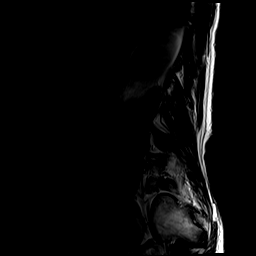

[Series 5: T1 · sagittal · 4.0mm · 1.09mm/px · 6 of 17 slices shown (1 of 2)]
[im 1/17]
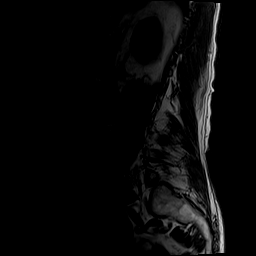
[im 4/17]
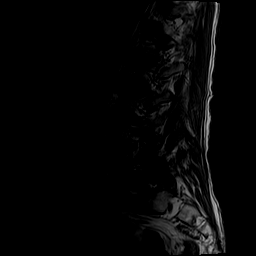
[im 7/17]
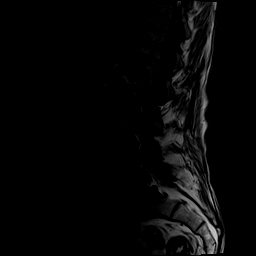
[im 10/17]
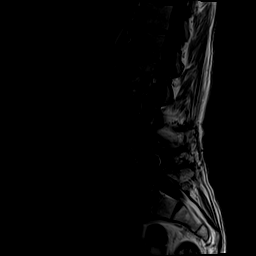
[im 13/17]
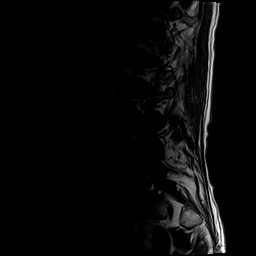
[im 17/17]
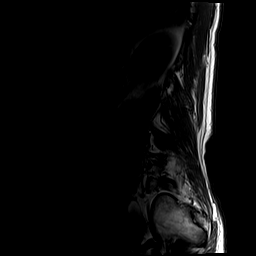

[Series 6: T2 · axial · 4.0mm · 0.39mm/px · z∈[+63,+271]mm · 9 of 39 slices shown (2 of 2)]
[im 1/39]
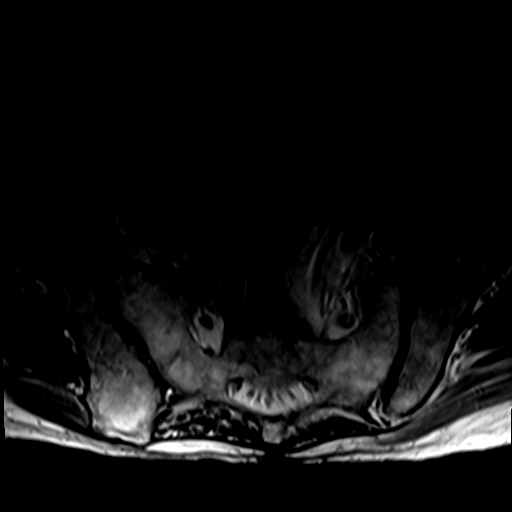
[im 6/39]
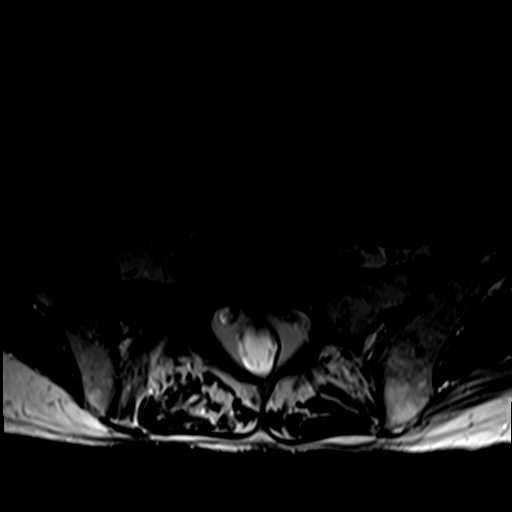
[im 11/39]
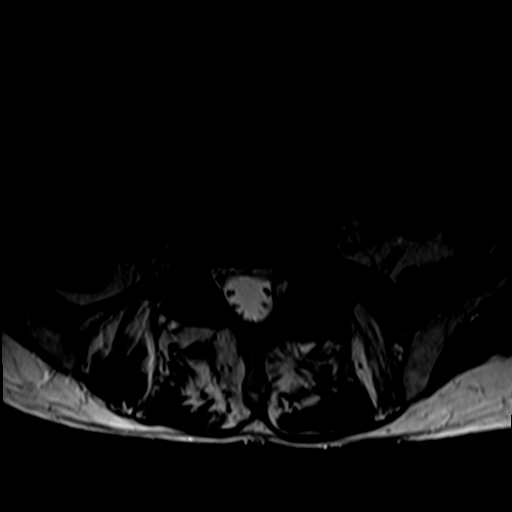
[im 17/39]
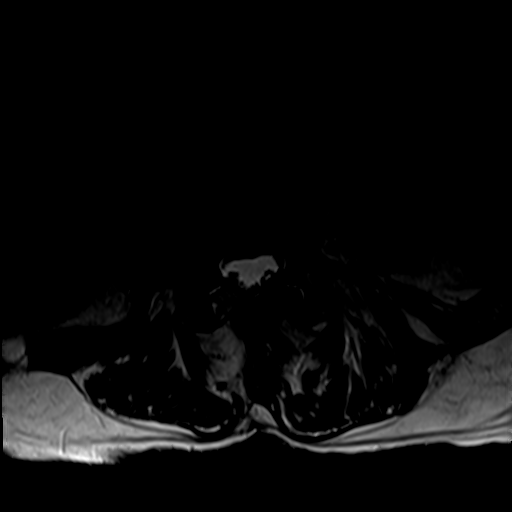
[im 20/39]
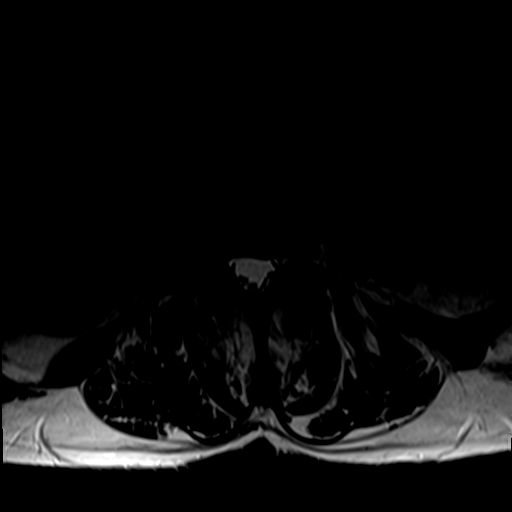
[im 22/39]
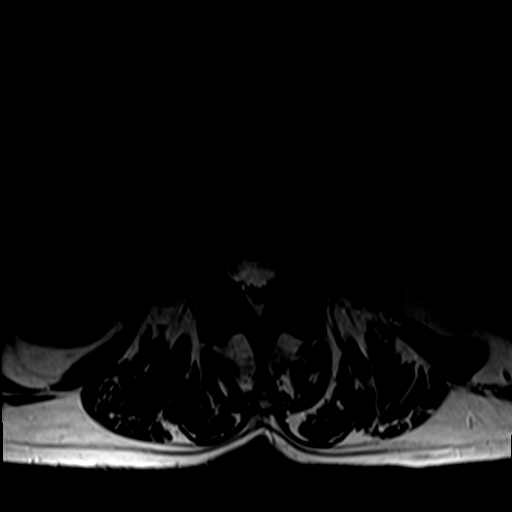
[im 28/39]
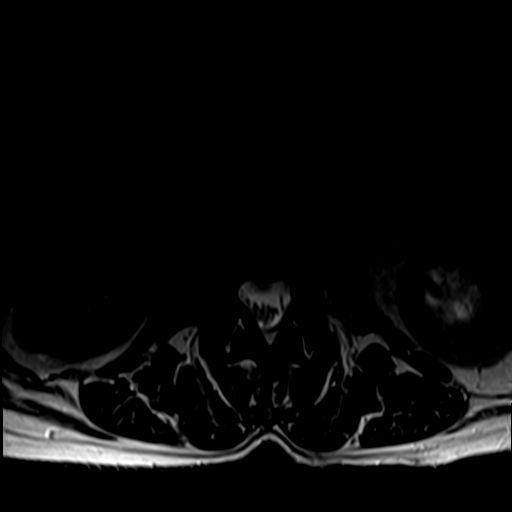
[im 33/39]
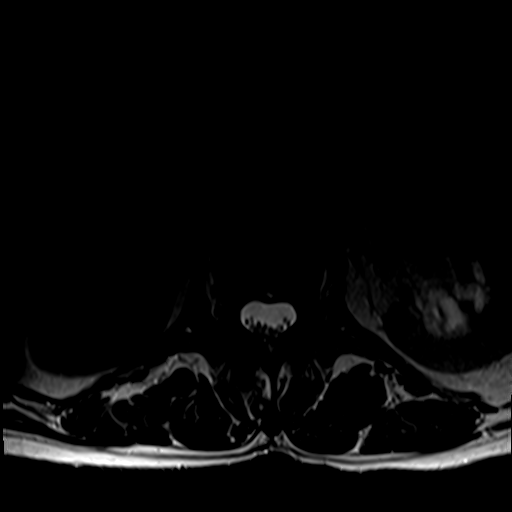
[im 39/39]
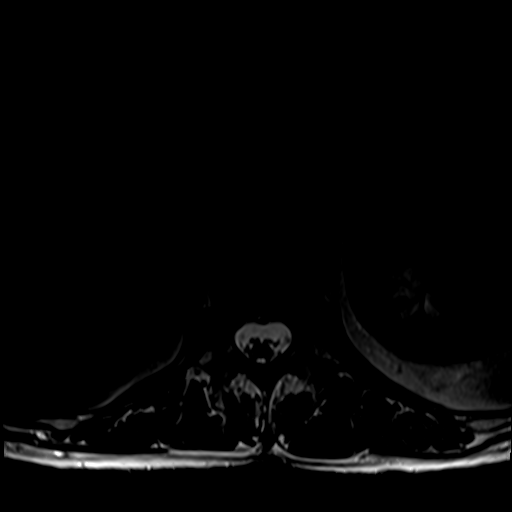

[Series 7: T1 · axial · 4.0mm · 0.39mm/px · z∈[+63,+242]mm · 6 of 39 slices shown (2 of 2)]
[im 1/39]
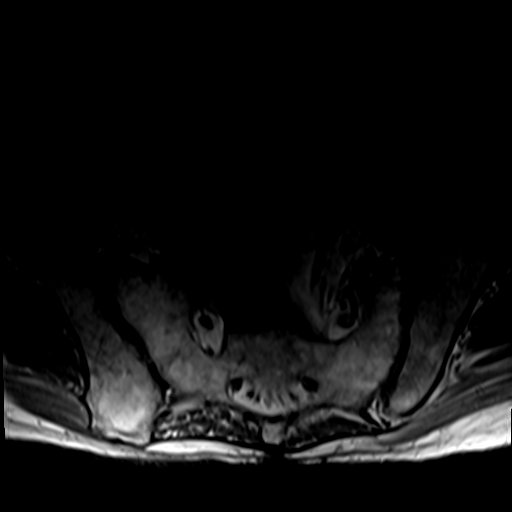
[im 6/39]
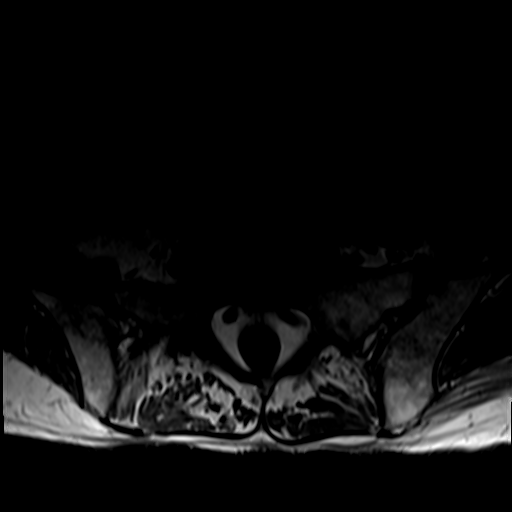
[im 11/39]
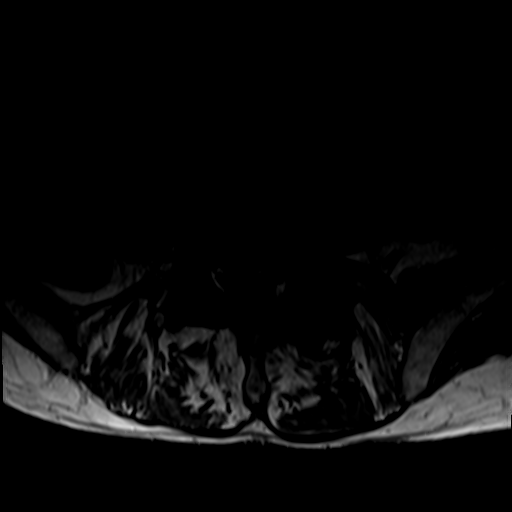
[im 17/39]
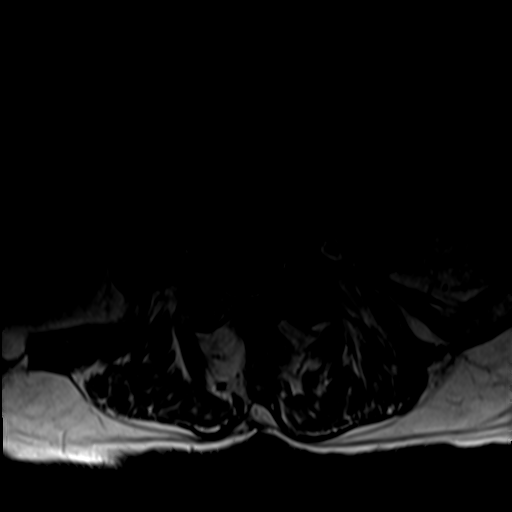
[im 20/39]
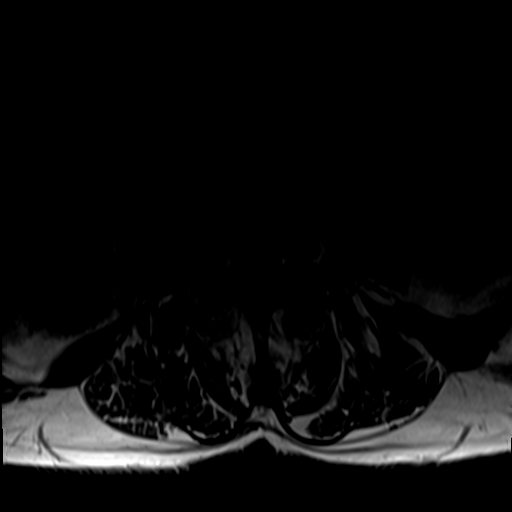
[im 33/39]
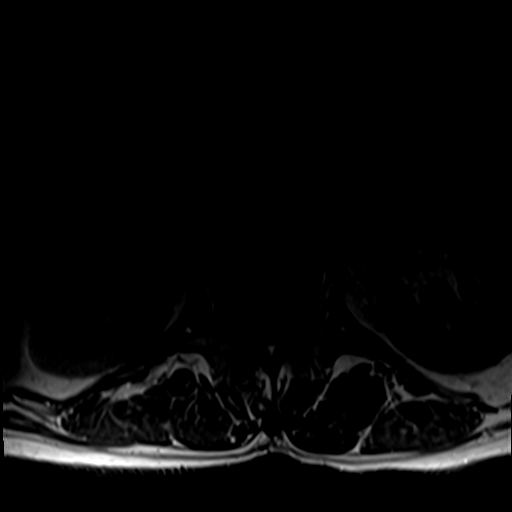

[27 of 48 positions shown; findings below may reference images not displayed]

FINDINGS: Segmentation:  Normal

Alignment: Mild retrolisthesis T12-L1, L1-2, L4-5. Mild
anterolisthesis L5-S1.

Vertebrae: Negative for fracture or mass. Hemangioma L3 vertebral
body. Discogenic edema on the left at L4-5. Diffuse discogenic edema
the bone marrow at L5-S1.

Conus medullaris and cauda equina: Conus extends to the L1 level.
Conus and cauda equina appear normal.

Paraspinal and other soft tissues: Negative for paraspinous mass or
adenopathy.

Disc levels:

T12-L1: Disc degeneration with diffuse disc bulging and small
central disc protrusion. Negative for stenosis

L1-2: Moderate disc degeneration with disc space narrowing and
diffuse endplate spurring right greater than left. Moderate to
severe subarticular and foraminal stenosis on the right. Moderate
left subarticular stenosis

L2-3: Disc degeneration with disc space narrowing and spurring left
greater than right. Bilateral mild facet degeneration. Moderate
subarticular stenosis bilaterally left greater than right.

L3-4: Disc degeneration with disc bulging and mild spurring.
Moderate facet degeneration. Mild subarticular stenosis bilaterally

L4-5: Right laminectomy. Moderate disc degeneration with disc space
narrowing and spurring. Discogenic bone marrow edema on the left.
Moderate subarticular and foraminal stenosis bilaterally

L5-S1: Disc degeneration with disc space narrowing. Diffuse
discogenic edema. Diffuse disc bulging and bilateral facet
degeneration. Moderate subarticular and foraminal stenosis
bilaterally.
IMPRESSION: Multilevel degenerative change throughout the lumbar spine.
Multilevel subarticular and foraminal stenosis bilaterally as
described above. Spinal canal adequate in size.

## 2021-02-08 ENCOUNTER — Ambulatory Visit (INDEPENDENT_AMBULATORY_CARE_PROVIDER_SITE_OTHER): Payer: Medicare Other | Admitting: Physical Therapy

## 2021-02-08 ENCOUNTER — Other Ambulatory Visit: Payer: Self-pay

## 2021-02-08 DIAGNOSIS — M79662 Pain in left lower leg: Secondary | ICD-10-CM | POA: Diagnosis not present

## 2021-02-08 DIAGNOSIS — M25552 Pain in left hip: Secondary | ICD-10-CM | POA: Diagnosis not present

## 2021-02-08 DIAGNOSIS — R262 Difficulty in walking, not elsewhere classified: Secondary | ICD-10-CM | POA: Diagnosis not present

## 2021-02-08 DIAGNOSIS — M6281 Muscle weakness (generalized): Secondary | ICD-10-CM | POA: Diagnosis not present

## 2021-02-08 NOTE — Therapy (Signed)
[facility] Physical Therapy [address] [place], [place], [address] Phone: [phone]   Fax:  [phone]  Physical Therapy Treatment/Recert  Patient Details  Name: Oscar Sparks MRN: [id number] Date of Birth: 01/15/1941 Referring Provider (PT): Dr. Erlinda Hong   Encounter Date: 02/08/2021   PT End of Session - 02/08/21 1121     Visit Number 39    Number of Visits 1    Date for PT Re-Evaluation 03/22/21    Progress Note Due on Visit 19    PT Start Time 0847    PT Stop Time 0933    PT Time Calculation (min) 46 min    Activity Tolerance Patient tolerated treatment well    Behavior During Therapy [name] for tasks assessed/performed             Past Medical History:  Diagnosis Date   Arthritis    knees   Colon cancer ([place]) 09/24/2018   ED (erectile dysfunction)    Neuromuscular disorder (HCC)    neuropathy in feet   Peripheral vascular disease ([place])    poor curculation in hands and feet hard to monitor with pulse oximetry   Prostate cancer [facility])    sees Dr. Gaynelle Arabian, received cesium seeds     Past Surgical History:  Procedure Laterality Date   [place]  2004   Dr. Sherwood Gambler   SUPERFICIAL PERONEAL NERVE RELEASE Left 06/23/2020   Procedure: left peroneal nerve neurolysis;  Surgeon: Leandrew Koyanagi, MD;  Location: [place];  Service: Orthopedics;  Laterality: Left;   TONSILLECTOMY      There were no vitals filed for this visit.   Subjective Assessment - 02/08/21 1041     Subjective relays he is not having pain, he feels he has overall had a good week last week on vacation and feels some small improvments.    Limitations Walking    Patient Stated Goals Walk without the cane    Pain Onset More than a month ago                [facility] PT Assessment - 02/08/21 0001       Assessment   Medical Diagnosis Lt peroneal nerve decompression, foot drop    Referring Provider (PT) Dr. Erlinda Hong    Onset Date/Surgical Date  06/23/20      Strength   Left Ankle Dorsiflexion 4+/5    Left Ankle Plantar Flexion 4+/5    Left Ankle Inversion 4/5    Left Ankle Eversion 3+/5                           [facility] Adult PT Treatment/Exercise - 02/08/21 0001       Ambulation/Gait   Ambulation/Gait Yes    Ambulation/Gait Assistance 5: Supervision    Gait Comments gait unsteadiness with decreased Lt foot clearance, trendelenburg gait ,he had one episode of LOB observed by receptionist upon him leaving session but did not fall      Knee/Hip Exercises: Aerobic   Recumbent Bike L5 X 10 min      Knee/Hip Exercises: Machines for Strengthening   Total Gym Leg Press 112# DL 3X15, then SL 56# 3X10      Electrical Stimulation   Electrical Stimulation Location Lt peroneals and anterior tib    Electrical Stimulation Action NMES/Russian    Electrical Stimulation Parameters 5/5 sec on/off for 8 minutes with actice EV and DF  Ankle Exercises: Standing   Other Standing Ankle Exercises heel and toe raises 2X15. lateral walking 3 round trips with red band no UE support. Tandem walk 3 round trips      Ankle Exercises: Stretches   Gastroc Stretch 3 reps;30 seconds      Ankle Exercises: Supine   T-Band green 4 way X 20 ea on Lt but used red on EV                       PT Short Term Goals - 02/08/21 1127       PT SHORT TERM GOAL #1   Title Patient will demonstrate independent use of home exercise program to maintain progress from in clinic treatments.    Baseline for current HEP    Time 4    Period Weeks    Status Achieved    Target Date 01/14/21      PT SHORT TERM GOAL #2   Title he will hold SLS on Lt leg for 5 sec avg    Baseline 3 second avg or must have UE support    Time 4    Period Weeks    Status On-going    Target Date 01/14/21               PT Long Term Goals - 02/08/21 1127       PT LONG TERM GOAL #1   Title Patient will demonstrate/report pain at worst less than  or equal to 2/10 to facilitate minimal limitation in daily activity secondary to pain symptoms.    Time 10    Period Weeks    Status Achieved      PT LONG TERM GOAL #2   Title Patient will demonstrate independent use of home exercise program to facilitate ability to maintain/progress functional gains from skilled physical therapy services.    Baseline for current HEP, will need to be updated by end of PT    Time 10    Period Weeks    Status Partially Met      PT LONG TERM GOAL #3   Title Pt. will demonstrate FOTO outcome > 55 to indicated reduced disability due to condition.    Time 10    Period Weeks    Status Achieved      PT LONG TERM GOAL #4   Title Pt. will demonstrate bilateral hip MMT > or = 4/5, ankle DF 5/5 bilateral to facilitate stability in ambulation, transfers independently.    Baseline hip grossly 4/5, lacking ankle EV strength to 3+    Time 10    Period Weeks    Status Partially Met      PT LONG TERM GOAL #5   Title Pt. will demonstrate BERG > 45 to indicate reduced fall risk.    Baseline 51 on 08/18/20    Status Achieved      PT LONG TERM GOAL #6   Title Pt. will demonstrate independent ambulation community distances.    Status Achieved      PT LONG TERM GOAL #7   Title He will be able to hold SLS on left for >8 seconds    Time 8    Period Weeks    Status New                   Plan - 02/08/21 1123     Clinical Impression Statement Recert today as his POC date had expired. I feel skilled PT  remains medically necessary and I will recert him for 6 more weeks in order to decrease his risk of falling by improving overall leg strength and balance. He has not had any recent falls since starting PT again but has had several episodes of nearly falling. He did show small improvments in overall Lt ankle strength but he remains very weak with his peroneals. Progress has been very slow up to this point due to his severe weakess S/P peroneal nerve decompression.     Comorbidities 2020 Colon Cancer, Neuropathy in feet, PVD, prostate cancer.  Previously seen by clinic for Lt hip pain    Examination-Activity Limitations Squat;Bend;Stairs;Stand;Locomotion Level;Transfers    Examination-Participation Restrictions Community Activity;Shop;Yard Work    Product manager    Rehab Potential --   fair to good   PT Frequency 2x / week    PT Duration Other (comment)    PT Treatment/Interventions ADLs/Self Care Home Management;Electrical Stimulation;Iontophoresis 39m/ml Dexamethasone;Moist Heat;Balance training;Therapeutic exercise;Therapeutic activities;Functional mobility training;Stair training;Gait training;DME Instruction;Ultrasound;Neuromuscular re-education;Patient/family education;Passive range of motion;Dry needling;Spinal Manipulations;Joint Manipulations;Taping;Manual techniques    PT Next Visit Plan continue with NMES for EV activation, Lt ankle strength and stability with focus on DF and EV, gait and balance, Lt leg strength with focus on quads and hip abductors    PT Home Exercise Plan [id number]    Consulted and Agree with Plan of Care Patient             Patient will benefit from skilled therapeutic intervention in order to improve the following deficits and impairments:  Abnormal gait, Decreased endurance, Decreased activity tolerance, Pain, Decreased strength, Difficulty walking, Decreased mobility, Decreased balance, Decreased range of motion, Decreased coordination, Impaired perceived functional ability, Improper body mechanics  Visit Diagnosis: Pain in left lower leg  Muscle weakness (generalized)  Pain in left hip  Difficulty in walking, not elsewhere classified     Problem List Patient Active Problem List   Diagnosis Date Noted   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   Cancer of ascending colon s/p robotic  colectomy 11/20/2018 09/30/2018   Iron deficiency anemia 10/16/2016   Pain in left hip 06/25/2014   Piriformis syndrome of left side 06/25/2014   [place]   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

## 2021-02-12 DIAGNOSIS — Z20828 Contact with and (suspected) exposure to other viral communicable diseases: Secondary | ICD-10-CM | POA: Diagnosis not present

## 2021-03-07 DIAGNOSIS — U071 COVID-19: Secondary | ICD-10-CM | POA: Diagnosis not present

## 2021-03-08 ENCOUNTER — Encounter: Payer: Medicare Other | Admitting: Physical Therapy

## 2021-03-10 ENCOUNTER — Encounter: Payer: Medicare Other | Admitting: Physical Therapy

## 2021-03-29 ENCOUNTER — Encounter: Payer: Self-pay | Admitting: Rehabilitative and Restorative Service Providers"

## 2021-03-29 ENCOUNTER — Other Ambulatory Visit: Payer: Self-pay

## 2021-03-29 ENCOUNTER — Ambulatory Visit (INDEPENDENT_AMBULATORY_CARE_PROVIDER_SITE_OTHER): Payer: Medicare Other | Admitting: Rehabilitative and Restorative Service Providers"

## 2021-03-29 DIAGNOSIS — M79662 Pain in left lower leg: Secondary | ICD-10-CM | POA: Diagnosis not present

## 2021-03-29 DIAGNOSIS — M25552 Pain in left hip: Secondary | ICD-10-CM

## 2021-03-29 DIAGNOSIS — M6281 Muscle weakness (generalized): Secondary | ICD-10-CM | POA: Diagnosis not present

## 2021-03-29 DIAGNOSIS — R262 Difficulty in walking, not elsewhere classified: Secondary | ICD-10-CM

## 2021-03-29 NOTE — Therapy (Signed)
[place], [place], [address] Phone: [phone]   Fax:  [phone]  Physical Therapy Treatment/ Recertification  Patient Details  Name: Oscar Sparks MRN: [id number] Date of Birth: June 18, 1940 Referring Provider (PT): Dr. Erlinda Hong   Encounter Date: 03/29/2021  Progress Note Reporting Period 02/08/2021 to 03/29/2021  See note below for Objective Data and Assessment of Progress/Goals.       PT End of Session - 03/29/21 0805     Visit Number 40    Number of Visits 50    Date for PT Re-Evaluation 04/26/21    Progress Note Due on Visit 51    PT Start Time 0800    PT Stop Time 0840    PT Time Calculation (min) 40 min    Activity Tolerance Patient tolerated treatment well    Behavior During Therapy [facility] for tasks assessed/performed             Past Medical History:  Diagnosis Date   Arthritis    knees   Colon cancer ([place]) 09/24/2018   ED (erectile dysfunction)    Neuromuscular disorder (HCC)    neuropathy in feet   Peripheral vascular disease ([place])    poor curculation in hands and feet hard to monitor with pulse oximetry   Prostate cancer [facility])    sees Dr. Gaynelle Arabian, received cesium seeds     Past Surgical History:  Procedure Laterality Date   [place]  2004   Dr. Sherwood Gambler   SUPERFICIAL PERONEAL NERVE RELEASE Left 06/23/2020   Procedure: left peroneal nerve neurolysis;  Surgeon: Leandrew Koyanagi, MD;  Location: [place];  Service: Orthopedics;  Laterality: Left;   TONSILLECTOMY      There were no vitals filed for this visit.   Subjective Assessment - 03/29/21 0803     Subjective Pt. indicated not having pain complaints.  Pt. indicated drop foot and foot rolling out to side still noted in ambulation.  Pt. indicated cane use off and on depending on feeling.  Global Rating of change +4 moderately better.    Limitations Walking    Patient Stated Goals Walk without the cane     Currently in Pain? No/denies    Pain Onset More than a month ago                [facility] PT Assessment - 03/29/21 0001       Assessment   Medical Diagnosis Lt peroneal nerve decompression, foot drop    Referring Provider (PT) Dr. Erlinda Hong    Onset Date/Surgical Date 06/23/20    Hand Dominance Right      Single Leg Stance   Comments Rt SLS 13 seconds, Lt unable to maintain      Strength   Left Ankle Dorsiflexion 4+/5    Left Ankle Inversion 4+/5    Left Ankle Eversion 3+/5      Transfers   Comments TUG 13.7 seconds      Ambulation/Gait   Ambulation/Gait Yes    Ambulation/Gait Assistance 7: Independent    Gait Pattern Decreased stride length;Wide base of support;Lateral trunk lean to left;Abducted - left;Poor foot clearance - left      Berg Balance Test   Sit to Stand Able to stand without using hands and stabilize independently    Standing Unsupported Able to stand safely 2 minutes    Sitting with Back Unsupported but Feet Supported on Floor or Stool Able to sit  safely and securely 2 minutes    Stand to Sit Sits safely with minimal use of hands    Transfers Able to transfer safely, minor use of hands    Standing Unsupported with Eyes Closed Able to stand 10 seconds safely    Standing Unsupported with Feet Together Able to place feet together independently and stand 1 minute safely    From Standing, Reach Forward with Outstretched Arm Can reach forward >12 cm safely (5")    From Standing Position, Pick up Object from Floor Able to pick up shoe safely and easily    From Standing Position, Turn to Look Behind Over each Shoulder Looks behind from both sides and weight shifts well    Turn 360 Degrees Able to turn 360 degrees safely in 4 seconds or less    Standing Unsupported, Alternately Place Feet on Step/Stool Able to stand independently and safely and complete 8 steps in 20 seconds    Standing Unsupported, One Foot in Front Able to plae foot ahead of the other independently  and hold 30 seconds    Standing on One Leg Tries to lift leg/unable to hold 3 seconds but remains standing independently    Total Score 51                           [facility] Adult PT Treatment/Exercise - 03/29/21 0001       Knee/Hip Exercises: Aerobic   Recumbent Bike Lvl 5 11 mins      Knee/Hip Exercises: Machines for Strengthening   Total Gym Leg Press 112# DL 3X15, then SL 56# 22x      Acupuncturist Action Held today due to time constraints c reassessment performed      Ankle Exercises: Stretches   Gastroc Stretch 30 seconds;3 reps   incline board     Ankle Exercises: Standing   Other Standing Ankle Exercises alt heel/toe raises 20x bilateral      Ankle Exercises: Supine   T-Band green 4 way X 20 ea on Lt                       PT Short Term Goals - 03/29/21 2751       PT SHORT TERM GOAL #1   Title Patient will demonstrate independent use of home exercise program to maintain progress from in clinic treatments.    Baseline for current HEP    Time 4    Period Weeks    Status Achieved    Target Date 01/14/21      PT SHORT TERM GOAL #2   Title he will hold SLS on Lt leg for 5 sec avg    Time 4    Period Weeks    Status Not Met    Target Date 01/14/21               PT Long Term Goals - 03/29/21 0816       PT LONG TERM GOAL #1   Title Patient will demonstrate/report pain at worst less than or equal to 2/10 to facilitate minimal limitation in daily activity secondary to pain symptoms.    Time 4    Period Weeks    Status Revised    Target Date 04/26/21      PT LONG TERM GOAL #2   Title Patient will demonstrate independent use of home exercise program to facilitate ability to maintain/progress functional gains  from skilled physical therapy services.    Time 4    Period Weeks    Status Revised    Target Date 04/26/21      PT LONG TERM GOAL #3   Title Pt. will demonstrate FOTO outcome > 55 to  indicated reduced disability due to condition.    Time 10    Period Weeks    Status Achieved      PT LONG TERM GOAL #4   Title Pt. will demonstrate bilateral hip MMT > or = 4/5, ankle DF 5/5 bilateral to facilitate stability in ambulation, transfers independently.    Time 4    Period Weeks    Status Revised    Target Date 04/26/21      PT LONG TERM GOAL #5   Title Pt. will demonstrate BERG > 45 to indicate reduced fall risk.    Baseline 51 on 08/18/20    Status Achieved      PT LONG TERM GOAL #6   Title Pt. will demonstrate independent ambulation community distances.    Status Achieved      PT LONG TERM GOAL #7   Title He will be able to hold SLS on left for >8 seconds    Time 4    Period Weeks    Status Revised    Target Date 04/26/21                   Plan - 03/29/21 0810     Clinical Impression Statement Recert today as his POC date had expired since last visit.  Pt. was diagnosed with COVID and limited his ability to attend according to original POC. Skilled PT remains medically necessary to facilitate efforts to decrease risk of falling by improving overall leg strength and balance. Pt. has reported benefits with global rating of change +4 at this time.  Continued presentation c reduced strength and movement coordination.    Comorbidities 2020 Colon Cancer, Neuropathy in feet, PVD, prostate cancer.  Previously seen by clinic for Lt hip pain    Examination-Activity Limitations Squat;Bend;Stairs;Stand;Locomotion Level;Transfers    Examination-Participation Restrictions Community Activity;Shop;Yard Work    Product manager    Rehab Potential --   fair to good   PT Frequency Other (comment)   1-2x/week   PT Duration 4 weeks    PT Treatment/Interventions ADLs/Self Care Home Management;Electrical Stimulation;Iontophoresis 27m/ml Dexamethasone;Moist Heat;Balance training;Therapeutic exercise;Therapeutic activities;Functional  mobility training;Stair training;Gait training;DME Instruction;Ultrasound;Neuromuscular re-education;Patient/family education;Passive range of motion;Dry needling;Spinal Manipulations;Joint Manipulations;Taping;Manual techniques    PT Next Visit Plan Functional  Lt ankle strength and stability, HEP transitioning as desired.    PT Home Exercise Plan [id number]    Consulted and Agree with Plan of Care Patient             Patient will benefit from skilled therapeutic intervention in order to improve the following deficits and impairments:  Abnormal gait, Decreased endurance, Decreased activity tolerance, Pain, Decreased strength, Difficulty walking, Decreased mobility, Decreased balance, Decreased range of motion, Decreased coordination, Impaired perceived functional ability, Improper body mechanics  Visit Diagnosis: Pain in left lower leg  Muscle weakness (generalized)  Pain in left hip  Difficulty in walking, not elsewhere classified     Problem List Patient Active Problem List   Diagnosis Date Noted   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

## 2021-03-30 ENCOUNTER — Telehealth: Payer: Self-pay | Admitting: Orthopaedic Surgery

## 2021-03-30 NOTE — Telephone Encounter (Signed)
Pt called requesting a handicap placard. Please call pt when ready for pick up. Pt phone number is [phone].

## 2021-03-30 NOTE — Telephone Encounter (Signed)
5 years

## 2021-03-31 ENCOUNTER — Encounter: Payer: Self-pay | Admitting: Rehabilitative and Restorative Service Providers"

## 2021-03-31 ENCOUNTER — Ambulatory Visit (INDEPENDENT_AMBULATORY_CARE_PROVIDER_SITE_OTHER): Payer: Medicare Other | Admitting: Rehabilitative and Restorative Service Providers"

## 2021-03-31 ENCOUNTER — Other Ambulatory Visit: Payer: Self-pay

## 2021-03-31 DIAGNOSIS — M79662 Pain in left lower leg: Secondary | ICD-10-CM

## 2021-03-31 DIAGNOSIS — R262 Difficulty in walking, not elsewhere classified: Secondary | ICD-10-CM | POA: Diagnosis not present

## 2021-03-31 DIAGNOSIS — M25552 Pain in left hip: Secondary | ICD-10-CM | POA: Diagnosis not present

## 2021-03-31 DIAGNOSIS — M6281 Muscle weakness (generalized): Secondary | ICD-10-CM

## 2021-03-31 NOTE — Therapy (Signed)
[place], [place], [address] Phone: [phone]   Fax:  [phone]  Physical Therapy Treatment /Discharge   Patient Details  Name: Oscar Sparks MRN: [id number] Date of Birth: March 22, 1941 Referring Provider (PT): Dr. Erlinda Hong   Encounter Date: 03/31/2021  PHYSICAL THERAPY DISCHARGE SUMMARY  Visits from Start of Care: 41  Current functional level related to goals / functional outcomes: See note   Remaining deficits: See note   Education / Equipment: HEP   Patient agrees to discharge. Patient goals were partially met. Patient is being discharged due to being pleased with the current functional level.    PT End of Session - 03/31/21 0800     Visit Number 41    Number of Visits 50    Date for PT Re-Evaluation 04/26/21    Progress Note Due on Visit 3    PT Start Time 0757    PT Stop Time 0836    PT Time Calculation (min) 39 min    Activity Tolerance Patient tolerated treatment well    Behavior During Therapy [facility] for tasks assessed/performed             Past Medical History:  Diagnosis Date   Arthritis    knees   Colon cancer ([place]) 09/24/2018   ED (erectile dysfunction)    Neuromuscular disorder (HCC)    neuropathy in feet   Peripheral vascular disease ([place])    poor curculation in hands and feet hard to monitor with pulse oximetry   Prostate cancer [facility])    sees Dr. Gaynelle Arabian, received cesium seeds     Past Surgical History:  Procedure Laterality Date   [place]  2004   Dr. Sherwood Gambler   SUPERFICIAL PERONEAL NERVE RELEASE Left 06/23/2020   Procedure: left peroneal nerve neurolysis;  Surgeon: Leandrew Koyanagi, MD;  Location: [place];  Service: Orthopedics;  Laterality: Left;   TONSILLECTOMY      There were no vitals filed for this visit.   Subjective Assessment - 03/31/21 0801     Subjective Pt. indicated no worse or better since last visit.  Pt. indicated he  called MD office to discuss handicap hnager for driving.    Limitations Walking    Patient Stated Goals Walk without the cane    Currently in Pain? No/denies    Pain Onset More than a month ago                [facility] PT Assessment - 03/31/21 0001       Assessment   Medical Diagnosis Lt peroneal nerve decompression, foot drop    Referring Provider (PT) Dr. Erlinda Hong    Onset Date/Surgical Date 06/23/20      Single Leg Stance   Comments Rt SLS 13 seconds, Lt unable to maintain      Strength   Left Ankle Dorsiflexion 4+/5    Left Ankle Inversion 4+/5    Left Ankle Eversion 3+/5      Transfers   Comments TUG 13.7 seconds      Ambulation/Gait   Ambulation/Gait Yes    Ambulation/Gait Assistance 7: Independent    Gait Pattern Decreased stride length;Wide base of support;Lateral trunk lean to left;Abducted - left;Poor foot clearance - left                           [facility] Adult PT Treatment/Exercise - 03/31/21 0001  Neuro Re-ed    Neuro Re-ed Details  tandem stance 1 min x 2 bilateral, SLS vector toe tapping fwd/lat/reverse x 10 bilateral      Knee/Hip Exercises: Aerobic   Recumbent Bike Lvl 5 10 mins      Knee/Hip Exercises: Machines for Strengthening   Total Gym Leg Press 112# DL 3X15, then SL 56# 20x      Knee/Hip Exercises: Standing   Other Standing Knee Exercises standing hip abduction press into doorway 5 sec hold x 10 bilateral      Ankle Exercises: Stretches   Gastroc Stretch 30 seconds;3 reps   incline board     Ankle Exercises: Standing   Other Standing Ankle Exercises alt heel/toe raises 20x bilateral                       PT Short Term Goals - 03/29/21 0816       PT SHORT TERM GOAL #1   Title Patient will demonstrate independent use of home exercise program to maintain progress from in clinic treatments.    Baseline for current HEP    Time 4    Period Weeks    Status Achieved    Target Date 01/14/21      PT SHORT  TERM GOAL #2   Title he will hold SLS on Lt leg for 5 sec avg    Time 4    Period Weeks    Status Not Met    Target Date 01/14/21               PT Long Term Goals - 03/31/21 5916       PT LONG TERM GOAL #1   Title Patient will demonstrate/report pain at worst less than or equal to 2/10 to facilitate minimal limitation in daily activity secondary to pain symptoms.    Time 4    Period Weeks    Status Achieved    Target Date 04/26/21      PT LONG TERM GOAL #2   Title Patient will demonstrate independent use of home exercise program to facilitate ability to maintain/progress functional gains from skilled physical therapy services.    Time 4    Period Weeks    Status Achieved    Target Date 04/26/21      PT LONG TERM GOAL #4   Title Pt. will demonstrate bilateral hip MMT > or = 4/5, ankle DF 5/5 bilateral to facilitate stability in ambulation, transfers independently.    Time 4    Period Weeks    Status Partially Met    Target Date 04/26/21      PT LONG TERM GOAL #5   Title Pt. will demonstrate BERG > 45 to indicate reduced fall risk.    Baseline 51 on 08/18/20    Status Achieved      PT LONG TERM GOAL #6   Title Pt. will demonstrate independent ambulation community distances.    Status Achieved      PT LONG TERM GOAL #7   Title He will be able to hold SLS on left for >8 seconds    Time 4    Period Weeks    Status Partially Met    Target Date 04/26/21                   Plan - 03/31/21 3846     Clinical Impression Statement Pt. to transition to HEP trial at this time to continue to address  balance and strength deficits.  Good knowledge of HEP overall at this time.  Pt. in agreement with plan.  Return prn based off presentation.    Comorbidities 2020 Colon Cancer, Neuropathy in feet, PVD, prostate cancer.  Previously seen by clinic for Lt hip pain    Examination-Activity Limitations Squat;Bend;Stairs;Stand;Locomotion Level;Transfers     Examination-Participation Restrictions Community Activity;Shop;Yard Work    Product manager    Rehab Potential --   fair to good   PT Frequency Other (comment)   1-2x/week   PT Duration 4 weeks    PT Treatment/Interventions ADLs/Self Care Home Management;Electrical Stimulation;Iontophoresis 37m/ml Dexamethasone;Moist Heat;Balance training;Therapeutic exercise;Therapeutic activities;Functional mobility training;Stair training;Gait training;DME Instruction;Ultrasound;Neuromuscular re-education;Patient/family education;Passive range of motion;Dry needling;Spinal Manipulations;Joint Manipulations;Taping;Manual techniques    PT Next Visit Plan HEP trial, keep track of POC date if returning.    PT Home Exercise Plan [id number]    Consulted and Agree with Plan of Care Patient             Patient will benefit from skilled therapeutic intervention in order to improve the following deficits and impairments:  Abnormal gait, Decreased endurance, Decreased activity tolerance, Pain, Decreased strength, Difficulty walking, Decreased mobility, Decreased balance, Decreased range of motion, Decreased coordination, Impaired perceived functional ability, Improper body mechanics  Visit Diagnosis: Pain in left lower leg  Muscle weakness (generalized)  Pain in left hip  Difficulty in walking, not elsewhere classified     Problem List Patient Active Problem List   Diagnosis Date Noted   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

## 2021-04-01 NOTE — Telephone Encounter (Signed)
Ready for pick up

## 2021-04-19 DIAGNOSIS — K4090 Unilateral inguinal hernia, without obstruction or gangrene, not specified as recurrent: Secondary | ICD-10-CM | POA: Diagnosis not present

## 2021-04-26 ENCOUNTER — Telehealth: Payer: Self-pay | Admitting: Family Medicine

## 2021-04-26 NOTE — Telephone Encounter (Signed)
Left message for patient to call back and schedule Medicare Annual Wellness Visit (AWV) either virtually or in office. Left  my Oscar Sparks number [phone]     awvi 03/27/17 per [facility]  please schedule at anytime with [facility] Nurse Health Advisor 1 or 2   This should be a 45 minute visit.

## 2021-05-12 ENCOUNTER — Encounter (HOSPITAL_BASED_OUTPATIENT_CLINIC_OR_DEPARTMENT_OTHER): Payer: Self-pay | Admitting: General Surgery

## 2021-05-12 ENCOUNTER — Ambulatory Visit: Payer: Self-pay | Admitting: General Surgery

## 2021-05-13 ENCOUNTER — Other Ambulatory Visit: Payer: Self-pay

## 2021-05-13 ENCOUNTER — Encounter (HOSPITAL_BASED_OUTPATIENT_CLINIC_OR_DEPARTMENT_OTHER): Payer: Self-pay | Admitting: General Surgery

## 2021-05-18 MED ORDER — CHLORHEXIDINE GLUCONATE CLOTH 2 % EX PADS
6.0000 | MEDICATED_PAD | Freq: Once | CUTANEOUS | Status: DC
Start: 2021-05-18 — End: 2021-05-20

## 2021-05-18 MED ORDER — CHLORHEXIDINE GLUCONATE CLOTH 2 % EX PADS: 6.0000 | MEDICATED_PAD | Freq: Once | CUTANEOUS | Status: DC

## 2021-05-18 NOTE — Progress Notes (Signed)

## 2021-05-20 ENCOUNTER — Encounter (HOSPITAL_BASED_OUTPATIENT_CLINIC_OR_DEPARTMENT_OTHER)
Admission: RE | Disposition: A | Discharge status: Home / Self Care | Payer: Self-pay | Source: Home / Self Care | Attending: General Surgery

## 2021-05-20 ENCOUNTER — Other Ambulatory Visit: Payer: Self-pay

## 2021-05-20 ENCOUNTER — Ambulatory Visit (HOSPITAL_BASED_OUTPATIENT_CLINIC_OR_DEPARTMENT_OTHER): Payer: Medicare Other | Admitting: Anesthesiology

## 2021-05-20 ENCOUNTER — Encounter (HOSPITAL_BASED_OUTPATIENT_CLINIC_OR_DEPARTMENT_OTHER): Payer: Self-pay | Admitting: General Surgery

## 2021-05-20 ENCOUNTER — Ambulatory Visit (HOSPITAL_BASED_OUTPATIENT_CLINIC_OR_DEPARTMENT_OTHER)
Admission: RE | Admit: 2021-05-20 | Discharge: 2021-05-20 | Disposition: A | Discharge status: Home / Self Care | Payer: Medicare Other | Attending: General Surgery | Admitting: General Surgery

## 2021-05-20 DIAGNOSIS — K4090 Unilateral inguinal hernia, without obstruction or gangrene, not specified as recurrent: Secondary | ICD-10-CM | POA: Insufficient documentation

## 2021-05-20 DIAGNOSIS — G8918 Other acute postprocedural pain: Secondary | ICD-10-CM | POA: Diagnosis not present

## 2021-05-20 HISTORY — DX: Anemia, unspecified: D64.9

## 2021-05-20 HISTORY — DX: Unilateral inguinal hernia, without obstruction or gangrene, not specified as recurrent: K40.90

## 2021-05-20 HISTORY — PX: INGUINAL HERNIA REPAIR: SHX194

## 2021-05-20 SURGERY — REPAIR, HERNIA, INGUINAL, ADULT
Anesthesia: Regional | Site: Groin | Laterality: Right

## 2021-05-20 MED ORDER — EPHEDRINE SULFATE (PRESSORS) 50 MG/ML IJ SOLN
INTRAMUSCULAR | Status: DC | PRN
  Administered 2021-05-20 (×2): 10 mg via INTRAVENOUS

## 2021-05-20 MED ORDER — FENTANYL CITRATE (PF) 100 MCG/2ML IJ SOLN
INTRAMUSCULAR | Status: AC
  Filled 2021-05-20: qty 2

## 2021-05-20 MED ORDER — PROPOFOL 10 MG/ML IV BOLUS
INTRAVENOUS | Status: DC | PRN
  Administered 2021-05-20: 150 mg via INTRAVENOUS

## 2021-05-20 MED ORDER — OXYCODONE HCL 5 MG PO TABS: 5.0000 mg | ORAL_TABLET | Freq: Four times a day (QID) | ORAL | 0 refills | Status: AC | PRN

## 2021-05-20 MED ORDER — LACTATED RINGERS IV SOLN: INTRAVENOUS | Status: DC

## 2021-05-20 MED ORDER — CEFAZOLIN SODIUM-DEXTROSE 2-4 GM/100ML-% IV SOLN
2.0000 g | INTRAVENOUS | Status: AC
  Administered 2021-05-20: 2 g via INTRAVENOUS

## 2021-05-20 MED ORDER — CELECOXIB 100 MG PO CAPS
ORAL_CAPSULE | ORAL | Status: AC
Start: 2021-05-20 — End: ?
  Filled 2021-05-20: qty 1

## 2021-05-20 MED ORDER — CELECOXIB 100 MG PO CAPS
100.0000 mg | ORAL_CAPSULE | ORAL | Status: AC
  Administered 2021-05-20: 100 mg via ORAL

## 2021-05-20 MED ORDER — FENTANYL CITRATE (PF) 100 MCG/2ML IJ SOLN: 25.0000 ug | INTRAMUSCULAR | Status: DC | PRN

## 2021-05-20 MED ORDER — GABAPENTIN 100 MG PO CAPS
ORAL_CAPSULE | ORAL | Status: AC
Start: 2021-05-20 — End: ?
  Filled 2021-05-20: qty 1

## 2021-05-20 MED ORDER — ROCURONIUM BROMIDE 100 MG/10ML IV SOLN
INTRAVENOUS | Status: DC | PRN
Start: 2021-05-20 — End: 2021-05-20
  Administered 2021-05-20: 50 mg via INTRAVENOUS

## 2021-05-20 MED ORDER — GABAPENTIN 100 MG PO CAPS
100.0000 mg | ORAL_CAPSULE | ORAL | Status: AC
  Administered 2021-05-20: 100 mg via ORAL

## 2021-05-20 MED ORDER — DEXAMETHASONE SODIUM PHOSPHATE 10 MG/ML IJ SOLN
INTRAMUSCULAR | Status: DC | PRN
  Administered 2021-05-20: 5 mg

## 2021-05-20 MED ORDER — ONDANSETRON HCL 4 MG/2ML IJ SOLN
INTRAMUSCULAR | Status: DC | PRN
  Administered 2021-05-20: 4 mg via INTRAVENOUS

## 2021-05-20 MED ORDER — CEFAZOLIN SODIUM-DEXTROSE 2-4 GM/100ML-% IV SOLN
INTRAVENOUS | Status: AC
  Filled 2021-05-20: qty 100

## 2021-05-20 MED ORDER — SUGAMMADEX SODIUM 200 MG/2ML IV SOLN
INTRAVENOUS | Status: DC | PRN
  Administered 2021-05-20: 200 mg via INTRAVENOUS

## 2021-05-20 MED ORDER — LIDOCAINE HCL (CARDIAC) PF 100 MG/5ML IV SOSY
PREFILLED_SYRINGE | INTRAVENOUS | Status: DC | PRN
Start: 2021-05-20 — End: 2021-05-20
  Administered 2021-05-20: 30 mg via INTRAVENOUS

## 2021-05-20 MED ORDER — ACETAMINOPHEN 500 MG PO TABS
1000.0000 mg | ORAL_TABLET | ORAL | Status: AC
  Administered 2021-05-20: 1000 mg via ORAL

## 2021-05-20 MED ORDER — FENTANYL CITRATE (PF) 100 MCG/2ML IJ SOLN
INTRAMUSCULAR | Status: DC | PRN
  Administered 2021-05-20: 25 ug via INTRAVENOUS

## 2021-05-20 MED ORDER — ACETAMINOPHEN 500 MG PO TABS
ORAL_TABLET | ORAL | Status: AC
  Filled 2021-05-20: qty 2

## 2021-05-20 MED ORDER — MIDAZOLAM HCL 2 MG/2ML IJ SOLN
2.0000 mg | Freq: Once | INTRAMUSCULAR | Status: AC
Start: 2021-05-20 — End: 2021-05-20
  Administered 2021-05-20: 1 mg via INTRAVENOUS

## 2021-05-20 MED ORDER — ACETAMINOPHEN 10 MG/ML IV SOLN: 1000.0000 mg | Freq: Once | INTRAVENOUS | Status: DC | PRN

## 2021-05-20 MED ORDER — MIDAZOLAM HCL 2 MG/2ML IJ SOLN
INTRAMUSCULAR | Status: AC
  Filled 2021-05-20: qty 2

## 2021-05-20 MED ORDER — DEXAMETHASONE SODIUM PHOSPHATE 4 MG/ML IJ SOLN
INTRAMUSCULAR | Status: DC | PRN
  Administered 2021-05-20: 4 mg via INTRAVENOUS

## 2021-05-20 MED ORDER — FENTANYL CITRATE (PF) 100 MCG/2ML IJ SOLN
100.0000 ug | Freq: Once | INTRAMUSCULAR | Status: AC
  Administered 2021-05-20: 50 ug via INTRAVENOUS

## 2021-05-20 MED ORDER — ROPIVACAINE HCL 5 MG/ML IJ SOLN
INTRAMUSCULAR | Status: DC | PRN
  Administered 2021-05-20: 25 mL

## 2021-05-20 MED ORDER — BUPIVACAINE-EPINEPHRINE 0.25% -1:200000 IJ SOLN
INTRAMUSCULAR | Status: DC | PRN
  Administered 2021-05-20: 10 mL

## 2021-05-20 SURGICAL SUPPLY — 50 items
ADH SKN CLS APL DERMABOND .7 (GAUZE/BANDAGES/DRESSINGS) ×1
APL PRP STRL LF DISP 70% ISPRP (MISCELLANEOUS) ×1
APL SKNCLS STERI-STRIP NONHPOA (GAUZE/BANDAGES/DRESSINGS)
BENZOIN TINCTURE PRP APPL 2/3 (GAUZE/BANDAGES/DRESSINGS) IMPLANT
BLADE CLIPPER SURG (BLADE) ×1 IMPLANT
BLADE HEX COATED 2.75 (ELECTRODE) ×1 IMPLANT
BLADE SURG 15 STRL LF DISP TIS (BLADE) ×1 IMPLANT
BLADE SURG 15 STRL SS (BLADE) ×2
CHLORAPREP W/TINT 26 (MISCELLANEOUS) ×2 IMPLANT
COVER BACK TABLE 60X90IN (DRAPES) ×2 IMPLANT
COVER MAYO STAND STRL (DRAPES) ×2 IMPLANT
DERMABOND ADVANCED (GAUZE/BANDAGES/DRESSINGS) ×1
DERMABOND ADVANCED .7 DNX12 (GAUZE/BANDAGES/DRESSINGS) ×1 IMPLANT
DRAIN PENROSE .5X12 LATEX STL (DRAIN) ×2 IMPLANT
DRAPE LAPAROTOMY TRNSV 102X78 (DRAPES) ×2 IMPLANT
DRAPE UTILITY XL STRL (DRAPES) ×2 IMPLANT
ELECT COATED BLADE 2.86 ST (ELECTRODE) ×1 IMPLANT
ELECT REM PT RETURN 9FT ADLT (ELECTROSURGICAL) ×2
ELECTRODE REM PT RTRN 9FT ADLT (ELECTROSURGICAL) ×1 IMPLANT
GLOVE SURG ENC MOIS LTX SZ7.5 (GLOVE) ×2 IMPLANT
GLOVE SURG POLYISO LF SZ6.5 (GLOVE) ×1 IMPLANT
GLOVE SURG UNDER POLY LF SZ6.5 (GLOVE) ×1 IMPLANT
GLOVE SURG UNDER POLY LF SZ7 (GLOVE) ×1 IMPLANT
GOWN STRL REUS W/ TWL LRG LVL3 (GOWN DISPOSABLE) ×2 IMPLANT
GOWN STRL REUS W/TWL LRG LVL3 (GOWN DISPOSABLE) ×4
MESH ULTRAPRO 3X6 7.6X15CM (Mesh General) ×1 IMPLANT
NDL HYPO 25X1 1.5 SAFETY (NEEDLE) ×1 IMPLANT
NEEDLE HYPO 25X1 1.5 SAFETY (NEEDLE) ×2 IMPLANT
NS IRRIG 1000ML POUR BTL (IV SOLUTION) ×2 IMPLANT
PACK BASIN DAY SURGERY FS (CUSTOM PROCEDURE TRAY) ×2 IMPLANT
PENCIL SMOKE EVACUATOR (MISCELLANEOUS) ×2 IMPLANT
SLEEVE SCD COMPRESS KNEE MED (STOCKING) ×2 IMPLANT
SPIKE FLUID TRANSFER (MISCELLANEOUS) IMPLANT
SPONGE T-LAP 18X18 ~~LOC~~+RFID (SPONGE) ×2 IMPLANT
STRIP CLOSURE SKIN 1/2X4 (GAUZE/BANDAGES/DRESSINGS) IMPLANT
SUT MON AB 4-0 PC3 18 (SUTURE) ×2 IMPLANT
SUT PROLENE 2 0 SH DA (SUTURE) ×4 IMPLANT
SUT SILK 2 0 SH (SUTURE) IMPLANT
SUT SILK 3 0 SH 30 (SUTURE) IMPLANT
SUT SILK 3 0 TIES 17X18 (SUTURE) ×2
SUT SILK 3-0 18XBRD TIE BLK (SUTURE) ×1 IMPLANT
SUT VIC AB 0 CT1 27 (SUTURE) ×2
SUT VIC AB 0 CT1 27XBRD ANBCTR (SUTURE) IMPLANT
SUT VIC AB 2-0 SH 27 (SUTURE) ×2
SUT VIC AB 2-0 SH 27XBRD (SUTURE) ×1 IMPLANT
SUT VIC AB 3-0 SH 27 (SUTURE) ×2
SUT VIC AB 3-0 SH 27X BRD (SUTURE) ×1 IMPLANT
SUT VICRYL 0 SH 27 (SUTURE) IMPLANT
SYR CONTROL 10ML LL (SYRINGE) ×2 IMPLANT
TOWEL GREEN STERILE FF (TOWEL DISPOSABLE) ×4 IMPLANT

## 2021-05-20 NOTE — Discharge Instructions (Signed)

## 2021-05-20 NOTE — H&P (Signed)
REFERRING PHYSICIAN: Jacquenette Shone, MD  PROVIDER: Landry Corporal, MD  MRN: [id number] DOB: [birth year] Subjective   Chief Complaint: Inguinal Hernia   History of Present Illness: Oscar Sparks is a 81 y.o. male who is seen today as an office consultation at the request of Dr. Sarajane Jews for evaluation of Inguinal Hernia .   We are asked to see the patient in consultation by Dr. Alysia Penna to evaluate him for a right inguinal hernia. The patient is a 81 year old white male who went to his medical doctor about 8 months ago and was found to have a right inguinal hernia. At that time he was asymptomatic. Over the last 8 months he feels as though the hernia has gotten slightly larger. He still does not have any pain associated with it. He denies any nausea or vomiting. His appetite is good and his bowels are moving regularly. He is otherwise in pretty good health.  Review of Systems: A complete review of systems was obtained from the patient. I have reviewed this information and discussed as appropriate with the patient. See HPI as well for other ROS.  ROS   Medical History: Past Medical History:  Diagnosis Date   Arthritis   History of cancer   Patient Active Problem List  Diagnosis   Non-recurrent unilateral inguinal hernia without obstruction or gangrene   Past Surgical History:  Procedure Laterality Date   Robotic Assisted Proximal Colectomy 11/20/2018  Dr. Johney Maine   Excision, Tumor, Soft Tissue of Upper Arm or Elbow Area 01/02/2020  Dr. Freddi Che   Left Peroneal Nerve Neurolysis 06/23/2020  Dr. Erlinda Hong    Allergies  Allergen Reactions   Poison Ivy Extract Itching and Rash   Current Outpatient Medications on File Prior to Visit  Medication Sig Dispense Refill   ascorbic acid, vitamin C, (VITAMIN C) 1000 MG tablet Take by mouth   cholecalciferol, vitamin D3, 250 mcg (10,000 unit) capsule 1 capsule   ferrous sulfate 325 (65 FE) MG tablet 2 tabs   multivitamin with B  complex-vitamin C (FARBEE WITH C) tablet Take 1 tablet by mouth every morning before breakfast   multivitamin with minerals tablet Take 1 tablet by mouth once daily   omega-3 fatty acids-fish oil 300-1,000 mg capsule Take by mouth   turmeric, bulk, 95 % Powd Take by mouth   vitamin E 400 UNIT capsule Take by mouth   No current facility-administered medications on file prior to visit.   Family History  Problem Relation Age of Onset   Breast cancer Mother   Coronary Artery Disease (Blocked arteries around heart) Father    Social History   Tobacco Use  Smoking Status Never  Smokeless Tobacco Never    Social History   Socioeconomic History   Marital status: Married  Tobacco Use   Smoking status: Never   Smokeless tobacco: Never  Substance and Sexual Activity   Alcohol use: Yes  Alcohol/week: 1.0 standard drink  Types: 1 Cans of beer per week   Drug use: Never   Objective:   Vitals:  BP: (!) 140/80  Pulse: 60  Temp: 36.4 C (97.5 F)  Weight: 64.4 kg (142 lb)  Height: 182.9 cm (6')   Body mass index is 19.26 kg/m.  Physical Exam Constitutional:  General: He is not in acute distress. Appearance: Normal appearance.  HENT:  Head: Normocephalic and atraumatic.  Right Ear: External ear normal.  Left Ear: External ear normal.  Nose: Nose normal.  Mouth/Throat:  Mouth: Mucous membranes are  moist.  Pharynx: Oropharynx is clear.  Eyes:  General: No scleral icterus. Extraocular Movements: Extraocular movements intact.  Conjunctiva/sclera: Conjunctivae normal.  Pupils: Pupils are equal, round, and reactive to light.  Cardiovascular:  Rate and Rhythm: Normal rate and regular rhythm.  Pulses: Normal pulses.  Heart sounds: Normal heart sounds.  Pulmonary:  Effort: Pulmonary effort is normal. No respiratory distress.  Breath sounds: Normal breath sounds.  Abdominal:  General: Abdomen is flat. Bowel sounds are normal. There is no distension.  Palpations: Abdomen is  soft.  Tenderness: There is no abdominal tenderness.  Genitourinary: Comments: There is a small reducible bulge in the right groin. There is no palpable bulge or impulse with straining in the left groin Musculoskeletal:  General: No swelling or deformity. Normal range of motion.  Cervical back: Normal range of motion and neck supple. No tenderness.  Skin: General: Skin is warm and dry.  Coloration: Skin is not jaundiced.  Neurological:  General: No focal deficit present.  Mental Status: He is alert and oriented to person, place, and time.  Psychiatric:  Mood and Affect: Mood normal.  Behavior: Behavior normal.     Labs, Imaging and Diagnostic Testing:  Assessment and Plan:   Diagnoses and all orders for this visit:  Non-recurrent unilateral inguinal hernia without obstruction or gangrene    The patient appears to have a small right inguinal hernia that has gotten larger over the last 8 months. Because of the risk of incarceration and strangulation he would prefer to have this fixed. I feel like this is a very reasonable thing to do. I have discussed with him in detail the risks and benefits of the operation as well as some of the technical aspects including the use of mesh and the risk of chronic pain and he understands and wishes to proceed.

## 2021-05-20 NOTE — Anesthesia Postprocedure Evaluation (Signed)
Anesthesia Post Note  Patient: Oscar Sparks  Procedure(s) Performed: OPEN RIGHT INGUINAL HERNIA REPAIR WITH MESH (Right: Groin)     Patient location during evaluation: PACU Anesthesia Type: Regional and General Level of consciousness: awake and alert Pain management: pain level controlled Vital Signs Assessment: post-procedure vital signs reviewed and stable Respiratory status: spontaneous breathing, nonlabored ventilation, respiratory function stable and patient connected to nasal cannula oxygen Cardiovascular status: blood pressure returned to baseline and stable Postop Assessment: no apparent nausea or vomiting Anesthetic complications: no   No notable events documented.  Last Vitals:  Vitals:   05/20/21 1330 05/20/21 1345  BP: 139/78 128/63  Pulse: 70 66  Resp: 12 16  Temp:  36.4 C  SpO2: 100% 96%    Last Pain:  Vitals:   05/20/21 1345  TempSrc:   PainSc: 0-No pain                 Belenda Cruise P Keeana Pieratt

## 2021-05-20 NOTE — Transfer of Care (Signed)
Immediate Anesthesia Transfer of Care Note  Patient: Oscar Sparks  Procedure(s) Performed: OPEN RIGHT INGUINAL HERNIA REPAIR WITH MESH (Right: Groin)  Patient Location: PACU  Anesthesia Type:GA combined with regional for post-op pain  Level of Consciousness: drowsy and patient cooperative  Airway & Oxygen Therapy: Patient Spontanous Breathing and Patient connected to face mask oxygen  Post-op Assessment: Report given to RN and Post -op Vital signs reviewed and stable  Post vital signs: Reviewed and stable  Last Vitals:  Vitals Value Taken Time  BP 133/66 05/20/21 1300  Temp    Pulse 67 05/20/21 1301  Resp 10 05/20/21 1301  SpO2 100 % 05/20/21 1301  Vitals shown include unvalidated device data.  Last Pain:  Vitals:   05/20/21 1031  TempSrc: Oral  PainSc: 0-No pain      Patients Stated Pain Goal: 3 (70/26/37 8588)  Complications: No notable events documented.

## 2021-05-20 NOTE — Anesthesia Procedure Notes (Signed)
Anesthesia Regional Block: TAP block   Pre-Anesthetic Checklist: , timeout performed,  Correct Patient, Correct Site, Correct Laterality,  Correct Procedure, Correct Position, site marked,  Risks and benefits discussed,  Surgical consent,  Pre-op evaluation,  At surgeon's request and post-op pain management  Laterality: Right  Prep: Dura Prep       Needles:  Injection technique: Single-shot  Needle Type: Echogenic Stimulator Needle     Needle Length: 10cm  Needle Gauge: 20     Additional Needles:   Procedures:,,,, ultrasound used (permanent image in chart),,    Narrative:  Start time: 05/20/2021 11:02 AM End time: 05/20/2021 11:07 AM Injection made incrementally with aspirations every 5 mL.  Performed by: Personally  Anesthesiologist: Darral Dash, DO  Additional Notes: Patient identified. Risks/Benefits/Options discussed with patient including but not limited to bleeding, infection, nerve damage, failed block, incomplete pain control. Patient expressed understanding and wished to proceed. All questions were answered. Sterile technique was used throughout the entire procedure. Please see nursing notes for vital signs. Aspirated in 5cc intervals with injection for negative confirmation. Patient was given instructions on fall risk and not to get out of bed. All questions and concerns addressed with instructions to call with any issues or inadequate analgesia.

## 2021-05-20 NOTE — Anesthesia Procedure Notes (Signed)
Procedure Name: Intubation Date/Time: 05/20/2021 11:43 AM Performed by: Signe Colt, CRNA Pre-anesthesia Checklist: Patient identified, Emergency Drugs available, Suction available and Patient being monitored Patient Re-evaluated:Patient Re-evaluated prior to induction Oxygen Delivery Method: Circle system utilized Preoxygenation: Pre-oxygenation with 100% oxygen Induction Type: IV induction Ventilation: Mask ventilation without difficulty Laryngoscope Size: Mac and 3 Grade View: Grade I Tube type: Oral Tube size: 7.0 mm Number of attempts: 1 Airway Equipment and Method: Stylet and Oral airway Placement Confirmation: ETT inserted through vocal cords under direct vision, positive ETCO2 and breath sounds checked- equal and bilateral Secured at: 21 cm Tube secured with: Tape Dental Injury: Teeth and Oropharynx as per pre-operative assessment  Difficulty Due To: Difficulty was anticipated Comments: Easy mask, easy intubation grade 1 view

## 2021-05-20 NOTE — Interval H&P Note (Signed)
History and Physical Interval Note:  05/20/2021 11:31 AM  Oscar Sparks  has presented today for surgery, with the diagnosis of RIGHT INGUINAL HERNIA.  The various methods of treatment have been discussed with the patient and family. After consideration of risks, benefits and other options for treatment, the patient has consented to  Procedure(s): OPEN RIGHT INGUINAL HERNIA REPAIR WITH MESH (Right) as a surgical intervention.  The patient's history has been reviewed, patient examined, no change in status, stable for surgery.  I have reviewed the patient's chart and labs.  Questions were answered to the patient's satisfaction.     Autumn Messing III

## 2021-05-20 NOTE — Progress Notes (Signed)
Assisted Dr. Jana Half with right, ultrasound guided, transabdominal plane block. Side rails up, monitors on throughout procedure. See vital signs in flow sheet. Tolerated Procedure well.

## 2021-05-20 NOTE — Op Note (Signed)
05/20/2021  12:48 PM  PATIENT:  Oscar Sparks  81 y.o. male  PRE-OPERATIVE DIAGNOSIS:  RIGHT INGUINAL HERNIA  POST-OPERATIVE DIAGNOSIS:  RIGHT INGUINAL HERNIA  PROCEDURE:  Procedure(s): OPEN RIGHT INGUINAL HERNIA REPAIR WITH MESH (Right)  SURGEON:  Surgeon(s) and Role:    * Jovita Kussmaul, MD - Primary  PHYSICIAN ASSISTANT:   ASSISTANTS: none   ANESTHESIA:   local and general  EBL:  minimal   BLOOD ADMINISTERED:none  DRAINS: none   LOCAL MEDICATIONS USED:  MARCAINE     SPECIMEN:  No Specimen  DISPOSITION OF SPECIMEN:  N/A  COUNTS:  YES  TOURNIQUET:  * No tourniquets in log *  DICTATION: .Dragon Dictation  After informed consent was obtained the patient was brought to the operating room and placed in the supine position on the operating table.  After adequate induction of general anesthesia the patient's abdomen and right groin were prepped with ChloraPrep, allowed to dry, and draped in usual sterile manner.  An appropriate timeout was performed.  The right groin was then infiltrated with quarter percent Marcaine.  A small incision was made from the edge of the pubic tubercle on the right towards the anterior superior iliac spine with a 15 blade knife.  The incision was carried through the skin and subcutaneous tissue sharply with the electrocautery until the fascia of the external oblique was encountered.  A small bridging vein was clamped with hemostats, divided, and ligated with 3-0 silk ties.  The external oblique fascia was then opened along its fibers towards the apex of the external ring with a 15 blade knife and Metzenbaum scissors.  A [place] retractor was deployed.  Blunt dissection was carried out of the cord structures until they could be surrounded between 2 fingers.  Half inch Penrose drain was placed around the cord structures for retraction purposes.  The cord was then gently skeletonized by blunt hemostat dissection.  There was no obvious hernia sac  associated with the cord itself.  There was a small hernia sac medial to this consistent with a direct hernia.  This was reduced easily.  The floor of the canal was then repaired with 0 Vicryl interrupted stitches.  A 3 x 6 piece of ultra Pro mass was then chosen and cut to the appropriate size.  The mesh was sewed inferiorly to the shelving edge of the inguinal ligament with a running 2-0 Prolene stitch.  Tails were cut in the mesh laterally and the tails were wrapped around the cord structures.  Medially and superiorly the mesh was sewed to the muscular aponeurotic strength of the transversalis with interrupted 2-0 Prolene vertical mattress stitches.  The tails of the mesh were anchored lateral to the cord to the shelving edge of the inguinal ligament with interrupted 2-0 Prolene stitches.  Once this was accomplished the mesh appeared to be in good position and the hernia seem well repaired.  The wound was irrigated with copious amounts of saline.  During the dissection the ilioinguinal nerve was identified and this appeared to be involved with some scar tissue.  It was dissected out proximally and distally, clamped, divided, and ligated with 3-0 silk ties.  Next the external oblique fascia was reapproximated with a running 2-0 Vicryl stitch.  The wound was infiltrated with more quarter percent Marcaine.  The subcutaneous fascia was closed with a running 3-0 Vicryl stitch.  The skin was then closed with a running 4-0 Monocryl subcuticular stitch.  Dermabond dressings were applied.  The  patient tolerated the procedure well.  At the end of the case all needle sponge and instrument counts were correct.  The patient was then awakened and taken to recovery in stable condition.  The patient's testicle was in the scrotum at the end of the case.  PLAN OF CARE: Discharge to home after PACU  PATIENT DISPOSITION:  PACU - hemodynamically stable.   Delay start of Pharmacological VTE agent (>24hrs) due to surgical blood  loss or risk of bleeding: not applicable

## 2021-05-20 NOTE — Anesthesia Preprocedure Evaluation (Signed)
Anesthesia Evaluation  Patient identified by MRN, date of birth, ID band Patient awake    Reviewed: Allergy & Precautions, NPO status , Patient's Chart, lab work & pertinent test results  Airway Mallampati: III  TM Distance: >3 FB Neck ROM: Limited    Dental no notable dental hx.    Pulmonary neg pulmonary ROS,    Pulmonary exam normal        Cardiovascular + Peripheral Vascular Disease   Rhythm:Regular Rate:Normal     Neuro/Psych negative neurological ROS  negative psych ROS   GI/Hepatic Neg liver ROS, Inguinal hernia   Endo/Other  negative endocrine ROS  Renal/GU negative Renal ROS  negative genitourinary   Musculoskeletal  (+) Arthritis ,   Abdominal Normal abdominal exam  (+)   Peds  Hematology  (+) Blood dyscrasia, anemia ,   Anesthesia Other Findings   Reproductive/Obstetrics                             Anesthesia Physical Anesthesia Plan  ASA: 2  Anesthesia Plan: General and Regional   Post-op Pain Management: Regional block*   Induction: Intravenous  PONV Risk Score and Plan: 2 and Ondansetron, Dexamethasone and Treatment may vary due to age or medical condition  Airway Management Planned: Mask and Oral ETT  Additional Equipment: None  Intra-op Plan:   Post-operative Plan: Extubation in OR  Informed Consent: I have reviewed the patients History and Physical, chart, labs and discussed the procedure including the risks, benefits and alternatives for the proposed anesthesia with the patient or authorized representative who has indicated his/her understanding and acceptance.     Dental advisory given  Plan Discussed with: CRNA  Anesthesia Plan Comments:         Anesthesia Quick Evaluation

## 2021-05-23 ENCOUNTER — Encounter (HOSPITAL_BASED_OUTPATIENT_CLINIC_OR_DEPARTMENT_OTHER): Payer: Self-pay | Admitting: General Surgery

## 2021-05-23 DIAGNOSIS — Z20822 Contact with and (suspected) exposure to covid-19: Secondary | ICD-10-CM | POA: Diagnosis not present

## 2021-06-20 ENCOUNTER — Telehealth: Payer: Self-pay | Admitting: Family Medicine

## 2021-06-20 NOTE — Telephone Encounter (Signed)
Left message for patient to call back and schedule Medicare Annual Wellness Visit (AWV) either virtually or in office. Left  my Oscar Sparks number [phone] ? ? ? awvi 03/27/17 per [place]  ?please schedule at anytime with [facility] Nurse Health Advisor 1 or 2 ? ? ?This should be a 45 minute visit.  ?

## 2021-06-23 DIAGNOSIS — Z20822 Contact with and (suspected) exposure to covid-19: Secondary | ICD-10-CM | POA: Diagnosis not present

## 2021-06-24 DIAGNOSIS — Z20822 Contact with and (suspected) exposure to covid-19: Secondary | ICD-10-CM | POA: Diagnosis not present

## 2021-06-28 DIAGNOSIS — Z20822 Contact with and (suspected) exposure to covid-19: Secondary | ICD-10-CM | POA: Diagnosis not present

## 2021-07-01 DIAGNOSIS — R059 Cough, unspecified: Secondary | ICD-10-CM | POA: Diagnosis not present

## 2021-07-01 DIAGNOSIS — R051 Acute cough: Secondary | ICD-10-CM | POA: Diagnosis not present

## 2021-07-01 DIAGNOSIS — Z20822 Contact with and (suspected) exposure to covid-19: Secondary | ICD-10-CM | POA: Diagnosis not present

## 2021-07-11 DIAGNOSIS — Z20822 Contact with and (suspected) exposure to covid-19: Secondary | ICD-10-CM | POA: Diagnosis not present

## 2021-07-13 ENCOUNTER — Telehealth: Payer: Self-pay | Admitting: Family Medicine

## 2021-07-13 NOTE — Telephone Encounter (Signed)
Left message for patient to call back and schedule Medicare Annual Wellness Visit (AWV) either virtually or in office. Left  my jabber number [phone] ? ?awvi 03/27/17 per [place]  ? please schedule at anytime with [facility] Nurse Health Advisor 1 or 2 ? ? ?

## 2021-07-20 ENCOUNTER — Telehealth: Payer: Self-pay | Admitting: Orthopaedic Surgery

## 2021-07-20 NOTE — Telephone Encounter (Signed)
Called and left patient a message that CD is ready at the front desk. Also there was no knee xray but L-spine and L hip.

## 2021-07-20 NOTE — Telephone Encounter (Signed)
Pt called requesting his xrays be sent to [facility]. Please send fax to [phone]. ?

## 2021-07-20 NOTE — Telephone Encounter (Signed)
Pt called back and stated left knee and left hip. He agreed to burn on CD and will pick up. Pt states to please call when ready for pick up. Need it as soon as possible. He has an appt on Monday. Pt phone number is [phone] ?

## 2021-07-20 NOTE — Telephone Encounter (Signed)
Patient called back. See message from [place] ? ?"Pt called back and stated left knee and left hip. He agreed to burn on CD and will pick up. Pt states to please call when ready for pick up. Need it as soon as possible. He has an appt on Monday. Pt phone number is [phone]" ?

## 2021-07-20 NOTE — Telephone Encounter (Signed)
Called patient no answer. LMOM.  ?We need to know which xrays (body part) he is referring to? ?Also, if we fax them they will not be as accurate. might be dark when faxing. Did he want [place] to burn a CD instead? ? ?

## 2021-07-20 NOTE — Telephone Encounter (Signed)
See other msg

## 2021-08-01 DIAGNOSIS — Z20822 Contact with and (suspected) exposure to covid-19: Secondary | ICD-10-CM | POA: Diagnosis not present

## 2021-08-16 ENCOUNTER — Ambulatory Visit: Payer: Medicare Other

## 2021-08-24 DIAGNOSIS — C4441 Basal cell carcinoma of skin of scalp and neck: Secondary | ICD-10-CM | POA: Diagnosis not present

## 2021-09-21 DIAGNOSIS — Z08 Encounter for follow-up examination after completed treatment for malignant neoplasm: Secondary | ICD-10-CM | POA: Diagnosis not present

## 2021-09-21 DIAGNOSIS — L820 Inflamed seborrheic keratosis: Secondary | ICD-10-CM | POA: Diagnosis not present

## 2021-09-21 DIAGNOSIS — L570 Actinic keratosis: Secondary | ICD-10-CM | POA: Diagnosis not present

## 2021-09-21 DIAGNOSIS — Z1283 Encounter for screening for malignant neoplasm of skin: Secondary | ICD-10-CM | POA: Diagnosis not present

## 2021-09-21 DIAGNOSIS — D225 Melanocytic nevi of trunk: Secondary | ICD-10-CM | POA: Diagnosis not present

## 2021-09-21 DIAGNOSIS — Z85820 Personal history of malignant melanoma of skin: Secondary | ICD-10-CM | POA: Diagnosis not present

## 2021-10-05 ENCOUNTER — Telehealth: Payer: Self-pay | Admitting: Orthopaedic Surgery

## 2021-10-05 NOTE — Telephone Encounter (Signed)
Patient called asked if he can get an order to have more (PT)? The number to contact patient is [phone]

## 2021-10-05 NOTE — Telephone Encounter (Signed)
yes

## 2021-10-06 NOTE — Telephone Encounter (Signed)
Tried to call patient to verify location and for what. Ask him to call me back.

## 2021-10-06 NOTE — Telephone Encounter (Signed)
Tried to call patient again. It goes straight to voicemail.

## 2021-10-07 ENCOUNTER — Telehealth: Payer: Self-pay | Admitting: Orthopaedic Surgery

## 2021-10-07 ENCOUNTER — Other Ambulatory Visit: Payer: Self-pay

## 2021-10-07 DIAGNOSIS — G5732 Lesion of lateral popliteal nerve, left lower limb: Secondary | ICD-10-CM

## 2021-10-07 NOTE — Telephone Encounter (Signed)
Patient returned call asked if he can start (PT) at [facility] with Elsie Ra. Patient said he has been having trouble with his phone.  The number to contact patient if needed again is [phone]

## 2021-10-07 NOTE — Telephone Encounter (Signed)
Patient called back. He wanted to do therapy here.

## 2021-10-24 ENCOUNTER — Ambulatory Visit (INDEPENDENT_AMBULATORY_CARE_PROVIDER_SITE_OTHER): Payer: Medicare Other | Admitting: Physical Therapy

## 2021-10-24 ENCOUNTER — Encounter: Payer: Self-pay | Admitting: Physical Therapy

## 2021-10-24 ENCOUNTER — Other Ambulatory Visit: Payer: Self-pay

## 2021-10-24 DIAGNOSIS — M6281 Muscle weakness (generalized): Secondary | ICD-10-CM

## 2021-10-24 DIAGNOSIS — R262 Difficulty in walking, not elsewhere classified: Secondary | ICD-10-CM

## 2021-10-24 DIAGNOSIS — M79662 Pain in left lower leg: Secondary | ICD-10-CM

## 2021-10-24 DIAGNOSIS — M25552 Pain in left hip: Secondary | ICD-10-CM | POA: Diagnosis not present

## 2021-10-24 NOTE — Therapy (Signed)
OUTPATIENT PHYSICAL THERAPY LOWER EXTREMITY EVALUATION   Patient Name: Oscar Sparks MRN: [id number] DOB:08/05/1940, 81 y.o., male Today's Date: 10/24/2021   PT End of Session - 10/24/21 1127     Visit Number 1    Number of Visits 20    Date for PT Re-Evaluation 01/16/22    Authorization Type MCR    Progress Note Due on Visit 10    PT Start Time 1100    PT Stop Time 1145    PT Time Calculation (min) 45 min    Activity Tolerance Patient tolerated treatment well    Behavior During Therapy [facility] for tasks assessed/performed             Past Medical History:  Diagnosis Date   Anemia    Arthritis    knees   Colon cancer ([place]) 09/24/2018   ED (erectile dysfunction)    Inguinal hernia    Neuromuscular disorder ([facility])    neuropathy in feet   Peripheral vascular disease ([place])    poor curculation in hands and feet hard to monitor with pulse oximetry   Prostate cancer ([place])    sees Dr. Gaynelle Arabian, received cesium seeds    Past Surgical History:  Procedure Laterality Date   BACK SURGERY     INGUINAL HERNIA REPAIR Right 05/20/2021   Procedure: OPEN RIGHT INGUINAL HERNIA REPAIR WITH MESH;  Surgeon: Jovita Kussmaul, MD;  Location: [place];  Service: General;  Laterality: Right;   SPINE SURGERY  2004   Dr. 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 He also feels he is off balance and has been using cane.  PERTINENT HISTORY: Lt peroneal nerve decompression, foot drop,2020 Colon Cancer, Neuropathy in feet, PVD, prostate cancer, hernia repair  PAIN:  Are you having pain? Yes: NPRS scale: 4/10 Pain location: left hip Pain description: inconvience Aggravating factors: prolonged standing or walking Relieving factors: rest, NSAIDS  PRECAUTIONS: Fall  WEIGHT BEARING RESTRICTIONS No  FALLS:  Has patient fallen in last 6 months? No, but has had several near falls  OCCUPATION: retired  PLOF: Independent with basic ADLs  PATIENT GOALS improve walking and balance by reducing foot drop   OBJECTIVE:  DIAGNOSTIC FINDINGS: nothing recent  PATIENT SURVEYS:  FOTO 61% functional intake at eval, goal is 69%  COGNITION:  Overall cognitive status: Within functional limits for tasks assessed     SENSATION:   EDEMA:    MUSCLE LENGTH:  POSTURE:   PALPATION:   LOWER EXTREMITY ROM:  Active ROM Right eval Left eval  Hip flexion    Hip extension    Hip abduction    Hip adduction    Hip internal rotation    Hip external rotation    Knee flexion    Knee extension    Ankle dorsiflexion  5  Ankle plantarflexion  WNL  Ankle inversion  WNL  Ankle  eversion  5   (Blank rows = not tested)  LOWER EXTREMITY MMT:  MMT in sitting/supine Right eval Left eval  Hip flexion  3+  Hip extension    Hip abduction  3  Hip adduction    Hip internal rotation    Hip external rotation    Knee flexion    Knee extension    Ankle dorsiflexion  4  Ankle plantarflexion  4  Ankle inversion  5  Ankle eversion  2+   (Blank rows = not tested)  LOWER EXTREMITY SPECIAL TESTS:    FUNCTIONAL TESTS:  10/24/21: 5 times sit to stand: 11.74 seconds without UE support  GAIT: Distance walked: Buyer, retail device utilized: Single point cane Level of assistance: Modified independence Comments: foot drop noted on left with trendelenburg    TODAY'S TREATMENT: 10/24/21 Trigger Point Dry-Needling  Treatment instructions: Expect mild to moderate muscle soreness. S/S of pneumothorax if dry needled over a lung field, and to seek immediate medical attention should they occur. Patient verbalized understanding of these instructions and education.  Patient Consent Given: Yes Education handout provided: Yes Muscles treated: left peroneal for muscle activation with Estim frequency 2 and intensity 3-4 to tolerance Treatment response/outcome: twitch response noted bilaterally    PATIENT EDUCATION:  Education details: exam findings and PT plan of care Person educated: Patient Education method: Explanation Education comprehension: verbalized understanding   HOME EXERCISE PROGRAM: Will review his HEP from last episode of care and revise PRN next session    ASSESSMENT:  CLINICAL IMPRESSION: Patient referred to PT for eval and treat s/p left peroneal nerve decompression 05/2020, with mild foot drop.  MD recommending Strengthening, gait training, and balance. Patient will benefit from skilled PT to address below impairments, limitations and improve overall function.  OBJECTIVE IMPAIRMENTS: decreased activity tolerance, difficulty walking,  decreased balance, decreased endurance, decreased mobility, decreased ROM, decreased strength, impaired flexibility, impaired LE use, postural dysfunction, and pain.  ACTIVITY LIMITATIONS: bending, lifting, carry, locomotion, cleaning, community activity, and or occupation  PERSONAL FACTORS: Lt peroneal nerve decompression, foot drop,2020 Colon Cancer, Neuropathy in feet, PVD, prostate cancer, hernia repair are also affecting patient's functional outcome.  REHAB POTENTIAL: Fair    CLINICAL DECISION MAKING: Stable/uncomplicated  EVALUATION COMPLEXITY: Low    GOALS: Short term PT Goals Target date: 11/21/2021 Pt will be I and compliant with HEP. Baseline:  Goal status: New Pt will decrease pain by 25% overall with prolonged standing Baseline: Goal status: New  Long term PT goals Target date: 01/16/2022 Pt will improve left ankle ROM to WFL (10 deg EV and DF) in supine to improve functional mobility Baseline: Goal status: New Pt will improve  hip/knee strength to at least 5-/5 MMT in sitting, and ankle EV strength to 4/5, ankle DF strength to 4+ to  improve functional strength Baseline: Goal status: New Pt will improve FOTO to at least 69% functional to show improved function Baseline: Goal status: New Pt will reduce pain by overall 50% overall with usual activity and prolonged standing Baseline: Goal status: New Balance goal to be written in future.  PLAN: PT FREQUENCY: 1-3 times per week   PT DURATION: 6-8 weeks  PLANNED INTERVENTIONS (unless contraindicated): aquatic PT, Canalith repositioning, cryotherapy, Electrical stimulation, Iontophoresis with 4 mg/ml dexamethasome, Moist heat, traction, Ultrasound, gait training, Therapeutic exercise, balance training, neuromuscular re-education, patient/family education, prosthetic training, manual techniques, passive ROM, dry needling, taping, vasopnuematic device, vestibular, spinal manipulations, joint manipulations  PLAN FOR  NEXT SESSION: review his previous HEP, further balance screen.    Debbe Odea, PT,DPT 10/24/2021, 11:35 AM

## 2021-10-25 ENCOUNTER — Ambulatory Visit (INDEPENDENT_AMBULATORY_CARE_PROVIDER_SITE_OTHER): Payer: Medicare Other | Admitting: Physical Therapy

## 2021-10-25 ENCOUNTER — Encounter: Payer: Self-pay | Admitting: Physical Therapy

## 2021-10-25 DIAGNOSIS — M79662 Pain in left lower leg: Secondary | ICD-10-CM | POA: Diagnosis not present

## 2021-10-25 DIAGNOSIS — M6281 Muscle weakness (generalized): Secondary | ICD-10-CM

## 2021-10-25 DIAGNOSIS — M25552 Pain in left hip: Secondary | ICD-10-CM | POA: Diagnosis not present

## 2021-10-25 DIAGNOSIS — R262 Difficulty in walking, not elsewhere classified: Secondary | ICD-10-CM | POA: Diagnosis not present

## 2021-10-25 NOTE — Therapy (Signed)
OUTPATIENT PHYSICAL THERAPY TREATMENT NOTE   Patient Name: Oscar Sparks MRN: [id number] DOB:May 01, 1940, 81 y.o., male Today's Date: 10/25/2021   END OF SESSION:   PT End of Session - 10/25/21 0825     Visit Number 2    Number of Visits 20    Date for PT Re-Evaluation 01/16/22    Authorization Type MCR    Progress Note Due on Visit 10    PT Start Time 0800    PT Stop Time 0845    PT Time Calculation (min) 45 min    Activity Tolerance Patient tolerated treatment well    Behavior During Therapy [facility] for tasks assessed/performed             Past Medical History:  Diagnosis Date   Anemia    Arthritis    knees   Colon cancer ([place]) 09/24/2018   ED (erectile dysfunction)    Inguinal hernia    Neuromuscular disorder (HCC)    neuropathy in feet   Peripheral vascular disease ([place])    poor curculation in hands and feet hard to monitor with pulse oximetry   Prostate cancer ([place])    sees Dr. Gaynelle Arabian, received cesium seeds    Past Surgical History:  Procedure Laterality Date   BACK SURGERY     INGUINAL HERNIA REPAIR Right 05/20/2021   Procedure: OPEN RIGHT INGUINAL HERNIA REPAIR WITH MESH;  Surgeon: Jovita Kussmaul, MD;  Location: [place];  Service: General;  Laterality: Right;   SPINE SURGERY  2004   Dr. 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 Yes: NPRS scale: 3/10 Pain location: left hip Pain description: inconvience Aggravating factors: prolonged standing or walking Relieving factors: rest, NSAIDS   PRECAUTIONS: Fall   WEIGHT BEARING RESTRICTIONS No   FALLS:  Has patient fallen in last 6 months? No, but has had several near falls   OCCUPATION: retired   PLOF: Independent with basic ADLs  PATIENT GOALS improve walking and balance by reducing foot drop     OBJECTIVE:    DIAGNOSTIC FINDINGS: nothing recent   PATIENT SURVEYS:  FOTO 61% functional intake at eval, goal is 69%   COGNITION:           Overall cognitive status: Within functional limits for tasks assessed                          SENSATION:     EDEMA:      MUSCLE LENGTH:   POSTURE:    PALPATION:     LOWER EXTREMITY ROM:   Active ROM Right eval Left eval  Hip flexion      Hip extension      Hip abduction      Hip adduction      Hip internal rotation      Hip external rotation      Knee flexion      Knee extension      Ankle dorsiflexion   5  Ankle  plantarflexion   WNL  Ankle inversion   WNL  Ankle eversion   5   (Blank rows = not tested)   LOWER EXTREMITY MMT:   MMT in sitting/supine Right eval Left eval  Hip flexion   3+  Hip extension      Hip abduction   3  Hip adduction      Hip internal rotation      Hip external rotation      Knee flexion      Knee extension      Ankle dorsiflexion   4  Ankle plantarflexion   4  Ankle inversion   5  Ankle eversion   2+   (Blank rows = not tested)   LOWER EXTREMITY SPECIAL TESTS:      FUNCTIONAL TESTS:  10/24/21: 5 times sit to stand: 11.74 seconds without UE support   GAIT: Distance walked: Buyer, retail device utilized: Single point cane Level of assistance: Modified independence Comments: foot drop noted on left with trendelenburg       TODAY'S TREATMENT: 10/25/21 -Recumbent bike L3 X 6 min -Slantboard 30 sec X 3 -Heel and toe raises X 20 bilat -Leg press DL 100# 2X20, then SL 50# 2X20 each side -Tandem balance 30 sec X 3 on foam -Supine ankle DF and PF with green X20, EV with red 2X10  -E-stim russian for motor recruitment of peroneals 5/5 sec on/off with intensity to tolerance and active EV contraction for 5 sec hold during on phase in sidelying X 6 total minutes  10/24/21 Trigger Point Dry-Needling  Treatment instructions: Expect mild to moderate muscle soreness. S/S of pneumothorax if dry needled over a lung field, and to seek immediate medical attention should they occur. Patient verbalized understanding of these instructions and education.   Patient Consent Given: Yes Education handout provided: Yes Muscles treated: left peroneal for muscle activation with Estim frequency 2 and intensity 3-4 to tolerance Treatment response/outcome: twitch response noted bilaterally       PATIENT EDUCATION:  Education details: exam findings and PT plan of care Person educated: Patient Education method: Explanation Education comprehension: verbalized  understanding     HOME EXERCISE PROGRAM: Will review his HEP from last episode of care and revise PRN next session       ASSESSMENT:   CLINICAL IMPRESSION: We began strength focus and added foam surface to build proprioception, ankle stability, and balance. I  also used NMES estim today in efforts to better recruit his peroneals.   OBJECTIVE IMPAIRMENTS: decreased activity tolerance, difficulty walking, decreased balance, decreased endurance, decreased mobility, decreased ROM, decreased strength, impaired flexibility, impaired LE use, postural dysfunction, and pain.   ACTIVITY LIMITATIONS: bending, lifting, carry, locomotion, cleaning, community activity, and or occupation   PERSONAL FACTORS: Lt peroneal nerve decompression, foot drop,2020 Colon Cancer, Neuropathy in feet, PVD, prostate cancer, hernia repair are also affecting patient's functional outcome.   REHAB POTENTIAL: Fair     CLINICAL DECISION MAKING: Stable/uncomplicated   EVALUATION COMPLEXITY: Low       GOALS: Short term PT Goals Target date: 11/21/2021 Pt will be I and compliant with HEP. Baseline:  Goal status: New Pt will decrease pain by 25% overall with prolonged standing Baseline: Goal status: New   Long term PT goals Target date: 01/16/2022 Pt will improve left ankle ROM to WFL (10 deg EV and DF) in supine to improve functional mobility Baseline: Goal status: New Pt will improve  hip/knee strength to at least 5-/5 MMT in sitting, and ankle EV strength to 4/5, ankle DF strength to 4+ to improve functional strength Baseline: Goal status: New Pt will improve FOTO to at least 69% functional to show improved function Baseline: Goal status: New Pt will reduce pain by overall 50% overall with usual activity and prolonged standing Baseline: Goal status: New Balance goal to be written in future.   PLAN: PT FREQUENCY: 1-3 times per week    PT DURATION: 6-8 weeks   PLANNED INTERVENTIONS (unless  contraindicated): aquatic PT, Canalith repositioning, cryotherapy, Electrical stimulation, Iontophoresis with 4 mg/ml dexamethasome, Moist heat, traction, Ultrasound, gait training, Therapeutic exercise, balance training, neuromuscular re-education, patient/family education, prosthetic training, manual techniques, passive ROM, dry needling, taping, vasopnuematic device, vestibular, spinal manipulations, joint manipulations   PLAN FOR NEXT SESSION: review his previous HEP, further balance screen.    Debbe Odea, PT,DPT 10/25/2021, 8:43 AM

## 2021-11-01 ENCOUNTER — Ambulatory Visit (INDEPENDENT_AMBULATORY_CARE_PROVIDER_SITE_OTHER): Payer: Medicare Other | Admitting: Physical Therapy

## 2021-11-01 ENCOUNTER — Encounter: Payer: Self-pay | Admitting: Physical Therapy

## 2021-11-01 DIAGNOSIS — R262 Difficulty in walking, not elsewhere classified: Secondary | ICD-10-CM

## 2021-11-01 DIAGNOSIS — M79662 Pain in left lower leg: Secondary | ICD-10-CM | POA: Diagnosis not present

## 2021-11-01 DIAGNOSIS — M25552 Pain in left hip: Secondary | ICD-10-CM | POA: Diagnosis not present

## 2021-11-01 DIAGNOSIS — M6281 Muscle weakness (generalized): Secondary | ICD-10-CM

## 2021-11-01 NOTE — Therapy (Signed)
OUTPATIENT PHYSICAL THERAPY TREATMENT NOTE   Patient Name: Oscar Sparks MRN: [id number] DOB:1941-03-20, 81 y.o., male Today's Date: 11/01/2021   END OF SESSION:   PT End of Session - 11/01/21 1319     Visit Number 3    Number of Visits 20    Date for PT Re-Evaluation 01/16/22    Authorization Type MCR    Progress Note Due on Visit 10    PT Start Time 1300    PT Stop Time 1345    PT Time Calculation (min) 45 min    Activity Tolerance Patient tolerated treatment well    Behavior During Therapy [facility] for tasks assessed/performed             Past Medical History:  Diagnosis Date   Anemia    Arthritis    knees   Colon cancer ([place]) 09/24/2018   ED (erectile dysfunction)    Inguinal hernia    Neuromuscular disorder (HCC)    neuropathy in feet   Peripheral vascular disease ([place])    poor curculation in hands and feet hard to monitor with pulse oximetry   Prostate cancer ([place])    sees Dr. Gaynelle Arabian, received cesium seeds    Past Surgical History:  Procedure Laterality Date   BACK SURGERY     INGUINAL HERNIA REPAIR Right 05/20/2021   Procedure: OPEN RIGHT INGUINAL HERNIA REPAIR WITH MESH;  Surgeon: Jovita Kussmaul, MD;  Location: [place];  Service: General;  Laterality: Right;   SPINE SURGERY  2004   Dr. 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 Yes: NPRS scale: 3/10 Pain location: left hip Pain description: inconvience Aggravating factors: prolonged standing or walking Relieving factors: rest, NSAIDS   PRECAUTIONS: Fall   WEIGHT BEARING RESTRICTIONS No   FALLS:  Has patient fallen in last 6 months? No, but has had several near falls   OCCUPATION: retired   PLOF: Independent with basic ADLs  PATIENT GOALS improve walking and balance by reducing foot drop     OBJECTIVE:    DIAGNOSTIC FINDINGS: nothing recent   PATIENT SURVEYS:  FOTO 61% functional intake at eval, goal is 69%   COGNITION:           Overall cognitive status: Within functional limits for tasks assessed                          SENSATION:     EDEMA:      MUSCLE LENGTH:   POSTURE:    PALPATION:     LOWER EXTREMITY ROM:   Active ROM Right eval Left eval  Hip flexion      Hip extension      Hip abduction      Hip adduction      Hip internal rotation      Hip external rotation      Knee flexion      Knee extension      Ankle dorsiflexion   5  Ankle  plantarflexion   WNL  Ankle inversion   WNL  Ankle eversion   5   (Blank rows = not tested)   LOWER EXTREMITY MMT:   MMT in sitting/supine Right eval Left eval  Hip flexion   3+  Hip extension      Hip abduction   3  Hip adduction      Hip internal rotation      Hip external rotation      Knee flexion      Knee extension      Ankle dorsiflexion   4  Ankle plantarflexion   4  Ankle inversion   5  Ankle eversion   2+   (Blank rows = not tested)   LOWER EXTREMITY SPECIAL TESTS:      FUNCTIONAL TESTS:  10/24/21: 5 times sit to stand: 11.74 seconds without UE support   GAIT: Distance walked: Buyer, retail device utilized: Single point cane Level of assistance: Modified independence Comments: foot drop noted on left with trendelenburg       TODAY'S TREATMENT: 11/01/21 -Recumbent bike L3 X 8 min -Slantboard 30 sec X 3 -Heel and toe raises X 20 bilat -Leg press DL 100# 2X15, then SL 50# 2X15 each side -Tandem balance 30 sec X 3 on foam -Supine ankle DF and PF with green X20, EV with red 2X10  Manual therapy for skilled palpation and compression with Trigger Point Dry-Needling   Patient Consent Given: Yes Education handout provided: previously provided Muscles treated: left peroneal for muscle activation with Estim frequency 2 and intensity 3-4 to tolerance Treatment response/outcome: twitch response noted bilaterally  10/25/21 -Recumbent bike L3 X 6 min -Slantboard 30 sec X 3 -Heel and toe raises X 20 bilat -Leg press DL 100# 2X20, then SL 50# 2X20 each side -Tandem balance 30 sec X 3 on foam -Supine ankle DF and PF with green X20, EV with red 2X10  -E-stim russian for motor recruitment of peroneals 5/5 sec on/off with intensity to tolerance and active EV contraction for 5 sec hold during on phase in sidelying X 6 total minutes  10/24/21 Trigger Point Dry-Needling  Treatment instructions: Expect mild to moderate muscle soreness. S/S of pneumothorax  if dry needled over a lung field, and to seek immediate medical attention should they occur. Patient verbalized understanding of these instructions and education.   Patient Consent Given: Yes Education handout provided: Yes Muscles treated:  left peroneal for muscle activation with Estim frequency 2 and intensity 3-4 to tolerance Treatment response/outcome: twitch response noted bilaterally       PATIENT EDUCATION:  Education details: exam findings and PT plan of care Person educated: Patient Education method: Explanation Education comprehension: verbalized understanding     HOME EXERCISE PROGRAM: Will review his HEP from last episode of care and revise PRN next session       ASSESSMENT:   CLINICAL IMPRESSION: Continued efforts to improve his DF and EV strength to improve Foot drop noted with gait. Peformed DN combined with estim today in efforts to better recruit his peroneals. PT will plan to alternate between DN and [place] Estim unless one proves to be more beneficial than other.   OBJECTIVE IMPAIRMENTS: decreased activity tolerance, difficulty walking, decreased balance, decreased endurance, decreased mobility, decreased ROM, decreased strength, impaired flexibility, impaired LE use, postural dysfunction, and pain.   ACTIVITY LIMITATIONS: bending, lifting, carry, locomotion, cleaning, community activity, and or occupation   PERSONAL FACTORS: Lt peroneal nerve decompression, foot drop,2020 Colon Cancer, Neuropathy in feet, PVD, prostate cancer, hernia repair are also affecting patient's functional outcome.   REHAB POTENTIAL: Fair     CLINICAL DECISION MAKING: Stable/uncomplicated   EVALUATION COMPLEXITY: Low       GOALS: Short term PT Goals Target date: 11/21/2021 Pt will be I and compliant with HEP. Baseline:  Goal status: New Pt will decrease pain by 25% overall with prolonged standing Baseline: Goal status: New   Long term PT goals Target date: 01/16/2022 Pt will  improve left ankle ROM to WFL (10 deg EV and DF) in supine to improve functional mobility Baseline: Goal status: New Pt will improve  hip/knee strength to at least 5-/5 MMT in sitting, and ankle EV strength to 4/5, ankle DF strength to 4+ to improve functional strength Baseline: Goal status: New Pt will improve FOTO to at least 69% functional to show improved function Baseline: Goal status: New Pt will reduce pain by overall 50% overall with usual activity and prolonged standing Baseline: Goal status: New Balance goal to be written in future.   PLAN: PT FREQUENCY: 1-3 times per week    PT DURATION: 6-8 weeks   PLANNED INTERVENTIONS (unless contraindicated): aquatic PT, Canalith repositioning, cryotherapy, Electrical stimulation, Iontophoresis with 4 mg/ml dexamethasome, Moist heat, traction, Ultrasound, gait training, Therapeutic exercise, balance training, neuromuscular re-education, patient/family education, prosthetic training, manual techniques, passive ROM, dry needling, taping, vasopnuematic device, vestibular, spinal manipulations, joint manipulations   PLAN FOR NEXT SESSION: DF and EV strength, balance, general leg strength, DN with stim and or [place] for peroneal recuitment    Debbe Odea, PT,DPT 11/01/2021, 1:20 PM

## 2021-11-03 ENCOUNTER — Encounter: Payer: Self-pay | Admitting: Physical Therapy

## 2021-11-03 ENCOUNTER — Ambulatory Visit (INDEPENDENT_AMBULATORY_CARE_PROVIDER_SITE_OTHER): Payer: Medicare Other | Admitting: Physical Therapy

## 2021-11-03 DIAGNOSIS — M79662 Pain in left lower leg: Secondary | ICD-10-CM

## 2021-11-03 DIAGNOSIS — M6281 Muscle weakness (generalized): Secondary | ICD-10-CM | POA: Diagnosis not present

## 2021-11-03 DIAGNOSIS — M25552 Pain in left hip: Secondary | ICD-10-CM | POA: Diagnosis not present

## 2021-11-03 DIAGNOSIS — R262 Difficulty in walking, not elsewhere classified: Secondary | ICD-10-CM

## 2021-11-03 NOTE — Therapy (Addendum)
OUTPATIENT PHYSICAL THERAPY TREATMENT NOTE   Patient Name: Oscar Sparks MRN: [id number] DOB:[birth year], 81 y.o., male Today's Date: 11/03/2021   END OF SESSION:   PT End of Session - 11/03/21 1324     Visit Number 4   Number of Visits 20    Date for PT Re-Evaluation 01/16/22    Authorization Type MCR    Progress Note Due on Visit 10    PT Start Time 1300    PT Stop Time 5993    PT Time Calculation (min) 38 min    Activity Tolerance Patient tolerated treatment well    Behavior During Therapy WFL for tasks assessed/performed             Past Medical History:  Diagnosis Date   Anemia    Arthritis    knees   Colon cancer ([place]) 09/24/2018   ED (erectile dysfunction)    Inguinal hernia    Neuromuscular disorder (HCC)    neuropathy in feet   Peripheral vascular disease (HCC)    poor curculation in hands and feet hard to monitor with pulse oximetry   Prostate cancer ([place])    sees Dr. Gaynelle Arabian, received cesium seeds    Past Surgical History:  Procedure Laterality Date   BACK SURGERY     INGUINAL HERNIA REPAIR Right 05/20/2021   Procedure: OPEN RIGHT INGUINAL HERNIA REPAIR WITH MESH;  Surgeon: Jovita Kussmaul, MD;  Location: [place];  Service: General;  Laterality: Right;   SPINE SURGERY  2004   Dr. 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 He has been doing the exercises and already rode the bike before coming in.   PERTINENT HISTORY: Lt peroneal nerve decompression, foot drop,2020 Colon Cancer, Neuropathy in feet, PVD, prostate cancer, hernia repair   PAIN:  Are you having pain? Yes: NPRS scale: 3/10 Pain location: left hip Pain description: inconvience Aggravating factors: prolonged standing or walking Relieving factors: rest, NSAIDS   PRECAUTIONS: Fall   WEIGHT BEARING RESTRICTIONS No   FALLS:  Has patient fallen in last 6 months? No, but has had several near falls   OCCUPATION: retired   PLOF: Independent with basic ADLs   PATIENT GOALS improve walking and balance by reducing foot drop     OBJECTIVE:  DIAGNOSTIC FINDINGS: nothing recent   PATIENT SURVEYS:  FOTO 61% functional intake at eval, goal is 69%   COGNITION:           Overall cognitive status: Within functional limits for tasks assessed                              LOWER EXTREMITY ROM:   Active ROM Right eval Left eval  Hip flexion      Hip extension      Hip abduction      Hip adduction      Hip internal rotation      Hip external rotation      Knee flexion      Knee extension      Ankle dorsiflexion   5  Ankle plantarflexion   WNL  Ankle inversion   WNL  Ankle eversion   5   (Blank rows = not tested)   LOWER EXTREMITY MMT:   MMT in sitting/supine  Right eval Left eval  Hip flexion   3+  Hip extension      Hip abduction   3  Hip adduction      Hip internal rotation      Hip external rotation      Knee flexion      Knee extension      Ankle dorsiflexion   4  Ankle plantarflexion   4  Ankle inversion   5  Ankle eversion   2+   (Blank rows = not tested)   LOWER EXTREMITY SPECIAL TESTS:      FUNCTIONAL TESTS:  10/24/21: 5 times sit to stand: 11.74 seconds without UE support   GAIT: Distance walked: Information systems manager utilized: Single point cane Level of assistance: Modified independence Comments: foot drop noted on left with trendelenburg       TODAY'S TREATMENT: -Slantboard 30 sec X 3 -Heel and toe raises X 20 bilat -Standing hip extension bilat X 20 with red -Sidestepping with red band and monster walk with red band at counter top X 3 -Supine ankle DF and PF with green X20, EV with red 2X10  -E-stim russian for motor recruitment of peroneals/Tibialis anterior 5/5 sec on/off with intensity to tolerance and active EV and DF contraction for 5 sec hold during on phase in sidelying X 10 total minutes  11/01/21 -Recumbent bike L3 X 8 min -Slantboard 30 sec X 3 -Heel and toe raises X 20 bilat -Leg press DL 100# 2X15, then SL 50# 2X15 each side -Tandem balance 30 sec X 3 on foam -Supine ankle DF and PF with green X20, EV with red 2X10  Manual therapy for skilled palpation and compression with Trigger Point Dry-Needling   Patient Consent Given: Yes Education handout provided: previously provided Muscles treated: left peroneal for muscle activation with Estim frequency 2 and intensity 3-4 to tolerance Treatment response/outcome: twitch response noted bilaterally  10/25/21 -Recumbent bike L3 X 6 min -Slantboard 30 sec X 3 -Heel and toe raises X 20 bilat -Leg press DL 100# 2X20, then SL 50# 2X20 each side -Tandem balance 30 sec X 3 on foam -Supine ankle DF and PF with green X20, EV with red  2X10  -E-stim russian for motor recruitment of peroneals 5/5 sec on/off with intensity to tolerance and active EV contraction for 5 sec hold during on phase in sidelying X 6 total minutes  10/24/21 Trigger Point Dry-Needling  Treatment instructions:  Expect mild to moderate muscle soreness. S/S of pneumothorax if dry needled over a lung field, and to seek immediate medical attention should they occur. Patient verbalized understanding of these instructions and education.   Patient Consent Given: Yes Education handout provided: Yes Muscles treated: left peroneal for muscle activation with Estim frequency 2 and intensity 3-4 to tolerance Treatment response/outcome: twitch response noted bilaterally       PATIENT EDUCATION:  Education details: exam findings and PT plan of care Person educated: Patient Education method: Explanation Education comprehension: verbalized understanding     HOME EXERCISE PROGRAM: Will review his HEP from last episode of care and revise PRN next session       ASSESSMENT:   CLINICAL IMPRESSION: Added more hip strengthening into his program today with good overall tolerance to this. Used [place] NMES stimulation with active DF and EV movements in efforts to improve recruitment and strength of this muscles in order to improve foot drop and risk of falling.   OBJECTIVE IMPAIRMENTS: decreased activity tolerance, difficulty walking, decreased balance, decreased endurance, decreased mobility, decreased ROM, decreased strength, impaired flexibility, impaired LE use, postural dysfunction, and pain.   ACTIVITY LIMITATIONS: bending, lifting, carry, locomotion, cleaning, community activity, and or occupation   PERSONAL FACTORS: Lt peroneal nerve decompression, foot drop,2020 Colon Cancer, Neuropathy in feet, PVD, prostate cancer, hernia repair are also affecting patient's functional outcome.   REHAB POTENTIAL: Fair     CLINICAL DECISION MAKING: Stable/uncomplicated    EVALUATION COMPLEXITY: Low       GOALS: Short term PT Goals Target date: 11/21/2021 Pt will be I and compliant with HEP. Baseline:  Goal status: New Pt will decrease pain by 25% overall with prolonged standing Baseline: Goal status: New   Long term PT goals Target date: 01/16/2022 Pt will improve left ankle ROM to WFL (10 deg EV and DF) in supine to improve functional mobility Baseline: Goal status: New Pt will improve  hip/knee strength to at least 5-/5 MMT in sitting, and ankle EV strength to 4/5, ankle DF strength to 4+ to improve functional strength Baseline: Goal status: New Pt will improve FOTO to at least 69% functional to show improved function Baseline: Goal status: New Pt will reduce pain by overall 50% overall with usual activity and prolonged standing Baseline: Goal status: New Balance goal to be written in future.   PLAN: PT FREQUENCY: 1-3 times per week    PT DURATION: 6-8 weeks   PLANNED INTERVENTIONS (unless contraindicated): aquatic PT, Canalith repositioning, cryotherapy, Electrical stimulation, Iontophoresis with 4 mg/ml dexamethasome, Moist heat, traction, Ultrasound, gait training, Therapeutic exercise, balance training, neuromuscular re-education, patient/family education, prosthetic training, manual techniques, passive ROM, dry needling, taping, vasopnuematic device, vestibular, spinal manipulations, joint manipulations   PLAN FOR NEXT SESSION: DF and EV strength, balance, general leg strength, DN with stim and or [place] for peroneal recuitment    Debbe Odea, PT,DPT 11/03/2021, 1:28 PM

## 2021-11-08 ENCOUNTER — Ambulatory Visit (INDEPENDENT_AMBULATORY_CARE_PROVIDER_SITE_OTHER): Payer: Medicare Other | Admitting: Physical Therapy

## 2021-11-08 ENCOUNTER — Encounter: Payer: Self-pay | Admitting: Physical Therapy

## 2021-11-08 DIAGNOSIS — M25552 Pain in left hip: Secondary | ICD-10-CM

## 2021-11-08 DIAGNOSIS — M6281 Muscle weakness (generalized): Secondary | ICD-10-CM

## 2021-11-08 DIAGNOSIS — M79662 Pain in left lower leg: Secondary | ICD-10-CM | POA: Diagnosis not present

## 2021-11-08 DIAGNOSIS — R262 Difficulty in walking, not elsewhere classified: Secondary | ICD-10-CM | POA: Diagnosis not present

## 2021-11-08 NOTE — Therapy (Signed)
OUTPATIENT PHYSICAL THERAPY TREATMENT NOTE   Patient Name: Oscar Sparks MRN: [id number] DOB:Aug 31, 1940, 81 y.o., male Today's Date: 11/08/2021   END OF SESSION:   PT End of Session - 11/08/21 1057     Visit Number 5    Number of Visits 20    Date for PT Re-Evaluation 01/16/22    Authorization Type MCR    Progress Note Due on Visit 10    PT Start Time 1100    PT Stop Time 1140    PT Time Calculation (min) 40 min    Activity Tolerance Patient tolerated treatment well    Behavior During Therapy [facility] for tasks assessed/performed             Past Medical History:  Diagnosis Date   Anemia    Arthritis    knees   Colon cancer ([place]) 09/24/2018   ED (erectile dysfunction)    Inguinal hernia    Neuromuscular disorder (HCC)    neuropathy in feet   Peripheral vascular disease ([place])    poor curculation in hands and feet hard to monitor with pulse oximetry   Prostate cancer ([place])    sees Dr. Gaynelle Arabian, received cesium seeds    Past Surgical History:  Procedure Laterality Date   BACK SURGERY     INGUINAL HERNIA REPAIR Right 05/20/2021   Procedure: OPEN RIGHT INGUINAL HERNIA REPAIR WITH MESH;  Surgeon: Jovita Kussmaul, MD;  Location: [place];  Service: General;  Laterality: Right;   SPINE SURGERY  2004   Dr. 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 He denies pain today   PERTINENT HISTORY: Lt peroneal nerve decompression, foot drop,2020 Colon Cancer, Neuropathy in feet, PVD, prostate cancer, hernia repair   PAIN:  Are you having pain? Yes: NPRS scale: 0/10 Pain location: left hip Pain description: inconvience Aggravating factors: prolonged standing or walking Relieving factors: rest, NSAIDS   PRECAUTIONS: Fall   WEIGHT BEARING RESTRICTIONS No   FALLS:  Has patient fallen in last 6 months? No, but has had several near falls   OCCUPATION: retired   PLOF: Independent with basic ADLs   PATIENT GOALS improve walking and balance by reducing foot drop     OBJECTIVE:    DIAGNOSTIC FINDINGS:  nothing recent   PATIENT SURVEYS:  FOTO 61% functional intake at eval, goal is 69%   COGNITION:           Overall cognitive status: Within functional limits for tasks assessed                              LOWER EXTREMITY ROM:   Active ROM Right eval Left eval  Hip flexion      Hip extension      Hip abduction      Hip adduction      Hip internal rotation      Hip external rotation      Knee flexion      Knee extension      Ankle dorsiflexion   5  Ankle plantarflexion   WNL  Ankle inversion   WNL  Ankle eversion   5   (Blank rows = not tested)   LOWER EXTREMITY MMT:   MMT in sitting/supine Right eval Left eval  Hip flexion    3+  Hip extension      Hip abduction   3  Hip adduction      Hip internal rotation      Hip external rotation      Knee flexion      Knee extension      Ankle dorsiflexion   4  Ankle plantarflexion   4  Ankle inversion   5  Ankle eversion   2+   (Blank rows = not tested)   LOWER EXTREMITY SPECIAL TESTS:      FUNCTIONAL TESTS:  10/24/21: 5 times sit to stand: 11.74 seconds without UE support   GAIT: Distance walked: Buyer, retail device utilized: Single point cane Level of assistance: Modified independence Comments: foot drop noted on left with trendelenburg       TODAY'S TREATMENT: 11/08/21 -Recumbent bike L3 X 8 min -Slantboard 30 sec X 3 -Heel and toe raises X 20 bilat -Leg press DL 100# 2X15, then SL 50# 2X15 each side -Tandem balance 30 sec X 3 on foam -Supine ankle DF and PF with green X20, EV with red 2X10  Manual therapy for skilled palpation and compression with Trigger Point Dry-Needling   Patient Consent Given: Yes Education handout provided: previously provided Muscles treated: left peroneal for muscle activation with Estim frequency 2 and intensity 4 to tolerance Treatment response/outcome: twitch response noted bilaterally  11/03/21 -Slantboard 30 sec X 3 -Heel and toe raises X 20 bilat -Standing hip extension bilat X 20 with red -Sidestepping with red band and monster walk with red band at counter top X 3 -Supine ankle DF and PF with green X20, EV with red 2X10  -E-stim russian for motor recruitment of peroneals/Tibialis anterior 5/5 sec on/off with intensity to tolerance and active EV and DF contraction for 5 sec hold during on phase in sidelying X 10 total minutes  11/01/21 -Recumbent bike L3 X 8 min -Slantboard 30 sec X 3 -Heel and toe raises X 20 bilat -Leg press DL 100# 2X15, then SL 50# 2X15 each side -Tandem balance 30 sec X 3 on foam -Supine ankle DF and PF with green X20, EV with red 2X10  Manual therapy for skilled  palpation and compression with Trigger Point Dry-Needling   Patient Consent Given: Yes Education handout provided: previously provided Muscles treated: left peroneal for muscle activation with Estim frequency 2 and intensity 3-4 to tolerance Treatment response/outcome:  twitch response noted bilaterally  10/25/21 -Recumbent bike L3 X 6 min -Slantboard 30 sec X 3 -Heel and toe raises X 20 bilat -Leg press DL 100# 2X20, then SL 50# 2X20 each side -Tandem balance 30 sec X 3 on foam -Supine ankle DF and PF with green X20, EV with red 2X10  -E-stim russian for motor recruitment of peroneals 5/5 sec on/off with intensity to tolerance and active EV contraction for 5 sec hold during on phase in sidelying X 6 total minutes  10/24/21 Trigger Point Dry-Needling  Treatment instructions: Expect mild to moderate muscle soreness. S/S of pneumothorax if dry needled over a lung field, and to seek immediate medical attention should they occur. Patient verbalized understanding of these instructions and education.   Patient Consent Given: Yes Education handout provided: Yes Muscles treated: left peroneal for muscle activation with Estim frequency 2 and intensity 3-4 to tolerance Treatment response/outcome: twitch response noted bilaterally       PATIENT EDUCATION:  Education details: exam findings and PT plan of care Person educated: Patient Education method: Explanation Education comprehension: verbalized understanding     HOME EXERCISE PROGRAM: Will review his HEP from last episode of care and revise PRN next session       ASSESSMENT:   CLINICAL IMPRESSION: : Continued efforts to recruit ankle DF and EV muscles today with exercises and DN combo with Estim for motor recruitment. PT recommending to continue current POC.    OBJECTIVE IMPAIRMENTS: decreased activity tolerance, difficulty walking, decreased balance, decreased endurance, decreased mobility, decreased ROM, decreased strength, impaired  flexibility, impaired LE use, postural dysfunction, and pain.   ACTIVITY LIMITATIONS: bending, lifting, carry, locomotion, cleaning, community activity, and or occupation   PERSONAL FACTORS: Lt peroneal nerve decompression, foot drop,2020 Colon Cancer, Neuropathy in feet, PVD, prostate cancer, hernia repair are also affecting patient's functional outcome.   REHAB POTENTIAL: Fair     CLINICAL DECISION MAKING: Stable/uncomplicated   EVALUATION COMPLEXITY: Low       GOALS: Short term PT Goals Target date: 11/21/2021 Pt will be I and compliant with HEP. Baseline:  Goal status: New Pt will decrease pain by 25% overall with prolonged standing Baseline: Goal status: New   Long term PT goals Target date: 01/16/2022 Pt will improve left ankle ROM to WFL (10 deg EV and DF) in supine to improve functional mobility Baseline: Goal status: New Pt will improve  hip/knee strength to at least 5-/5 MMT in sitting, and ankle EV strength to 4/5, ankle DF strength to 4+ to improve functional strength Baseline: Goal status: New Pt will improve FOTO to at least 69% functional to show improved function Baseline: Goal status: New Pt will reduce pain by overall 50% overall with usual activity and prolonged standing Baseline: Goal status: New Balance goal to be written in future.   PLAN: PT FREQUENCY: 1-3 times per week    PT DURATION: 6-8 weeks   PLANNED INTERVENTIONS (unless contraindicated): aquatic PT, Canalith repositioning, cryotherapy, Electrical stimulation, Iontophoresis with 4 mg/ml dexamethasome, Moist heat, traction, Ultrasound, gait training, Therapeutic exercise, balance training, neuromuscular re-education, patient/family education, prosthetic training, manual techniques, passive ROM, dry needling, taping, vasopnuematic device, vestibular, spinal manipulations, joint manipulations   PLAN FOR NEXT SESSION: DF and EV strength, balance, general leg strength, DN with stim and or [place]  for peroneal recuitment    Debbe Odea, PT,DPT 11/08/2021, 10:59 AM

## 2021-11-10 ENCOUNTER — Encounter: Payer: Self-pay | Admitting: Physical Therapy

## 2021-11-10 ENCOUNTER — Ambulatory Visit (INDEPENDENT_AMBULATORY_CARE_PROVIDER_SITE_OTHER): Payer: Medicare Other | Admitting: Physical Therapy

## 2021-11-10 DIAGNOSIS — M25552 Pain in left hip: Secondary | ICD-10-CM

## 2021-11-10 DIAGNOSIS — M6281 Muscle weakness (generalized): Secondary | ICD-10-CM

## 2021-11-10 DIAGNOSIS — M79662 Pain in left lower leg: Secondary | ICD-10-CM | POA: Diagnosis not present

## 2021-11-10 DIAGNOSIS — R262 Difficulty in walking, not elsewhere classified: Secondary | ICD-10-CM

## 2021-11-10 NOTE — Therapy (Signed)
OUTPATIENT PHYSICAL THERAPY TREATMENT NOTE   Patient Name: Oscar Sparks MRN: [id number] DOB:09/22/1940, 81 y.o., male Today's Date: 11/10/2021   END OF SESSION:   PT End of Session - 11/10/21 1008     Visit Number 6    Number of Visits 20    Date for PT Re-Evaluation 01/16/22    Authorization Type MCR    Progress Note Due on Visit 10    PT Start Time 1010    PT Stop Time 1055    PT Time Calculation (min) 45 min    Activity Tolerance Patient tolerated treatment well    Behavior During Therapy [facility] for tasks assessed/performed             Past Medical History:  Diagnosis Date   Anemia    Arthritis    knees   Colon cancer ([place]) 09/24/2018   ED (erectile dysfunction)    Inguinal hernia    Neuromuscular disorder (HCC)    neuropathy in feet   Peripheral vascular disease ([place])    poor curculation in hands and feet hard to monitor with pulse oximetry   Prostate cancer ([place])    sees Dr. Gaynelle Arabian, received cesium seeds    Past Surgical History:  Procedure Laterality Date   BACK SURGERY     INGUINAL HERNIA REPAIR Right 05/20/2021   Procedure: OPEN RIGHT INGUINAL HERNIA REPAIR WITH MESH;  Surgeon: Jovita Kussmaul, MD;  Location: [place];  Service: General;  Laterality: Right;   SPINE SURGERY  2004   Dr. 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   PERTINENT HISTORY: Lt peroneal nerve decompression, foot drop,2020 Colon Cancer, Neuropathy in feet, PVD, prostate cancer, hernia repair   PAIN:  Are you having pain? Yes: NPRS scale: 0/10 Pain location: left hip Pain description: inconvience Aggravating factors: prolonged standing or walking Relieving factors: rest, NSAIDS   PRECAUTIONS: Fall   WEIGHT BEARING RESTRICTIONS No   FALLS:  Has patient fallen in last 6 months? No, but has had several near falls   OCCUPATION: retired   PLOF: Independent with basic ADLs   PATIENT GOALS improve walking and balance by reducing foot drop     OBJECTIVE:    DIAGNOSTIC FINDINGS: nothing recent   PATIENT SURVEYS:  FOTO 61% functional intake at eval, goal is 69%   COGNITION:           Overall cognitive status: Within functional limits for tasks assessed                              LOWER EXTREMITY ROM:   Active ROM Right eval Left eval  Hip flexion      Hip extension      Hip abduction      Hip adduction      Hip internal rotation      Hip external rotation      Knee flexion      Knee extension      Ankle dorsiflexion   5  Ankle plantarflexion   WNL  Ankle inversion   WNL  Ankle eversion   5   (Blank rows = not tested)   LOWER EXTREMITY MMT:   MMT in sitting/supine Right eval Left eval  Hip flexion   3+  Hip extension      Hip  abduction   3  Hip adduction      Hip internal rotation      Hip external rotation      Knee flexion      Knee extension      Ankle dorsiflexion   4  Ankle plantarflexion   4  Ankle inversion   5  Ankle eversion   2+   (Blank rows = not tested)   LOWER EXTREMITY SPECIAL TESTS:      FUNCTIONAL TESTS:  10/24/21: 5 times sit to stand: 11.74 seconds without UE support   GAIT: Distance walked: Buyer, retail device utilized: Single point cane Level of assistance: Modified independence Comments: foot drop noted on left with trendelenburg       TODAY'S TREATMENT: 11/10/21 -Slantboard 30 sec X 3 -Heel and toe raises X 20 bilat -Standing hip extension bilat X 20 with red -Sidestepping with red band and monster walk with red band at counter top X 3 -Leg press DL 100# 2X15, then SL 50# 2X15 each side -Supine ankle DF and PF with green X20, EV with red 2X10  -E-stim russian for motor recruitment of peroneals/Tibialis anterior 5/5 sec on/off with intensity to tolerance and active EV and DF contraction for 5 sec hold during on phase X 10 total minutes  11/08/21 -Recumbent bike L3 X 8 min -Slantboard 30 sec X 3 -Heel and toe raises X 20 bilat -Leg press DL 100# 2X15, then SL 50# 2X15 each side -Tandem balance 30 sec X 3 on foam -Supine ankle DF and PF with green X20, EV with red 2X10  Manual therapy for skilled palpation and compression with Trigger Point Dry-Needling   Patient Consent Given: Yes Education handout provided: previously provided Muscles treated: left peroneal for muscle activation with Estim frequency 2 and intensity 4 to tolerance Treatment response/outcome: twitch response noted bilaterally  11/03/21 -Slantboard 30 sec X 3 -Heel and toe raises X 20 bilat -Standing hip extension bilat X 20 with red -Sidestepping with red band and monster walk with red band at counter top X 3 -Supine ankle DF and PF with green X20, EV with red 2X10  -E-stim russian  for motor recruitment of peroneals/Tibialis anterior 5/5 sec on/off with intensity to tolerance and active EV and DF contraction for 5 sec hold during on phase in sidelying X 10 total minutes  PATIENT EDUCATION:  Education details: exam findings and PT plan of care Person educated: Patient Education method: Explanation Education comprehension: verbalized understanding     HOME EXERCISE PROGRAM: Will review his HEP from last episode of care and revise PRN next session       ASSESSMENT:   CLINICAL IMPRESSION: : He feels he is walking some better and arrives without SPC. PT will continue to work to reduce his foot drop with strengthening and motor recruitment interventions. PT recommending to continue current POC.    OBJECTIVE IMPAIRMENTS: decreased activity tolerance, difficulty walking, decreased balance, decreased endurance, decreased mobility, decreased ROM, decreased strength, impaired flexibility, impaired LE use, postural dysfunction, and pain.   ACTIVITY LIMITATIONS: bending, lifting, carry, locomotion, cleaning, community activity, and or occupation   PERSONAL FACTORS: Lt peroneal nerve decompression, foot drop,2020 Colon Cancer, Neuropathy in feet, PVD, prostate cancer, hernia repair are also affecting patient's functional outcome.   REHAB POTENTIAL: Fair     CLINICAL DECISION MAKING: Stable/uncomplicated   EVALUATION COMPLEXITY: Low       GOALS: Short term PT Goals Target date: 11/21/2021 Pt will be I and compliant with HEP. Baseline:  Goal status: New Pt will decrease pain by 25% overall with prolonged standing Baseline: Goal status: New   Long term PT goals Target date: 01/16/2022 Pt will improve left ankle ROM to WFL (10 deg EV and DF) in supine to improve functional mobility Baseline: Goal status: New Pt will improve  hip/knee strength to at least 5-/5 MMT in sitting, and ankle EV strength to 4/5, ankle DF strength to 4+ to improve functional  strength Baseline: Goal status: New Pt will improve FOTO to at least 69% functional to show improved function Baseline: Goal status: New Pt will reduce pain by overall 50% overall with usual activity and prolonged standing Baseline: Goal status: New Balance goal to be written in future.   PLAN: PT FREQUENCY: 1-3 times per week    PT DURATION: 6-8 weeks   PLANNED INTERVENTIONS (unless contraindicated): aquatic PT, Canalith repositioning, cryotherapy, Electrical stimulation, Iontophoresis with 4 mg/ml dexamethasome, Moist heat, traction, Ultrasound, gait training, Therapeutic exercise, balance training, neuromuscular re-education, patient/family education, prosthetic training, manual techniques, passive ROM, dry needling, taping, vasopnuematic device, vestibular, spinal manipulations, joint manipulations   PLAN FOR NEXT SESSION: DF and EV strength, balance, general leg strength, DN with stim and or [place] for peroneal recuitment    Debbe Odea, PT,DPT 11/10/2021, 10:09 AM

## 2021-11-15 ENCOUNTER — Encounter: Payer: Medicare Other | Admitting: Physical Therapy

## 2021-11-17 ENCOUNTER — Encounter: Payer: Self-pay | Admitting: Physical Therapy

## 2021-11-17 ENCOUNTER — Ambulatory Visit (INDEPENDENT_AMBULATORY_CARE_PROVIDER_SITE_OTHER): Payer: Medicare Other | Admitting: Physical Therapy

## 2021-11-17 DIAGNOSIS — M79662 Pain in left lower leg: Secondary | ICD-10-CM | POA: Diagnosis not present

## 2021-11-17 DIAGNOSIS — M25552 Pain in left hip: Secondary | ICD-10-CM

## 2021-11-17 DIAGNOSIS — M6281 Muscle weakness (generalized): Secondary | ICD-10-CM

## 2021-11-17 DIAGNOSIS — R262 Difficulty in walking, not elsewhere classified: Secondary | ICD-10-CM | POA: Diagnosis not present

## 2021-11-17 NOTE — Therapy (Signed)
OUTPATIENT PHYSICAL THERAPY TREATMENT NOTE   Patient Name: Oscar Sparks MRN: [id number] DOB:1940-12-09, 81 y.o., male Today's Date: 11/17/2021   END OF SESSION:   PT End of Session - 11/17/21 1307     Visit Number 7    Number of Visits 20    Date for PT Re-Evaluation 01/16/22    Authorization Type MCR    Progress Note Due on Visit 10    PT Start Time 1300    PT Stop Time 1345    PT Time Calculation (min) 45 min    Activity Tolerance Patient tolerated treatment well    Behavior During Therapy [facility] for tasks assessed/performed             Past Medical History:  Diagnosis Date   Anemia    Arthritis    knees   Colon cancer ([place]) 09/24/2018   ED (erectile dysfunction)    Inguinal hernia    Neuromuscular disorder (HCC)    neuropathy in feet   Peripheral vascular disease (HCC)    poor curculation in hands and feet hard to monitor with pulse oximetry   Prostate cancer ([place])    sees Dr. Gaynelle Arabian, received cesium seeds    Past Surgical History:  Procedure Laterality Date   BACK SURGERY     INGUINAL HERNIA REPAIR Right 05/20/2021   Procedure: OPEN RIGHT INGUINAL HERNIA REPAIR WITH MESH;  Surgeon: Jovita Kussmaul, MD;  Location: [place];  Service: General;  Laterality: Right;   SPINE SURGERY  2004   Dr. 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   PERTINENT HISTORY: Lt peroneal nerve decompression, foot drop,2020 Colon Cancer, Neuropathy in feet, PVD, prostate cancer, hernia repair   PAIN:  Are you having pain? Yes: NPRS scale: 0/10 Pain location: left hip Pain description: inconvience Aggravating factors: prolonged standing or walking Relieving factors: rest, NSAIDS   PRECAUTIONS: Fall   WEIGHT BEARING RESTRICTIONS No   FALLS:  Has patient fallen in last 6 months? No, but has had several near falls   OCCUPATION: retired   PLOF: Independent with basic ADLs   PATIENT GOALS improve walking and balance by reducing foot drop     OBJECTIVE:    DIAGNOSTIC FINDINGS: nothing recent   PATIENT  SURVEYS:  FOTO 61% functional intake at eval, goal is 69%   COGNITION:           Overall cognitive status: Within functional limits for tasks assessed                              LOWER EXTREMITY ROM:   Active ROM Right eval Left eval Left 11/17/21  Hip flexion       Hip extension       Hip abduction       Hip adduction       Hip internal rotation       Hip external rotation       Knee flexion       Knee extension       Ankle dorsiflexion   5 7  Ankle plantarflexion   WNL   Ankle inversion   WNL   Ankle eversion   5 10   (Blank rows = not tested)   LOWER EXTREMITY MMT:   MMT in sitting/supine Right eval Left eval  Hip  flexion   3+  Hip extension      Hip abduction   3  Hip adduction      Hip internal rotation      Hip external rotation      Knee flexion      Knee extension      Ankle dorsiflexion   4  Ankle plantarflexion   4  Ankle inversion   5  Ankle eversion   2+   (Blank rows = not tested)   LOWER EXTREMITY SPECIAL TESTS:      FUNCTIONAL TESTS:  10/24/21: 5 times sit to stand: 11.74 seconds without UE support   GAIT: Distance walked: Buyer, retail device utilized: Single point cane Level of assistance: Modified independence Comments: foot drop noted on left with trendelenburg       TODAY'S TREATMENT: 11/17/21 -Recumbent bike L3 X 8 min -Slantboard 30 sec X 3 -Heel and toe raises X 20 bilat -Supine ankle eversion isometrics 5 sec hold 2X10 -Sidelying clams X20 for left -Sidelying reverse clams X 20 for left  Manual therapy for skilled palpation and compression with Trigger Point Dry-Needling   Patient Consent Given: Yes Education handout provided: previously provided Muscles treated: left peroneal for muscle activation with Estim frequency 2 and intensity 3-4 to tolerance Treatment response/outcome: twitch response noted bilaterally  11/10/21 -Slantboard 30 sec X 3 -Heel and toe raises X 20 bilat -Standing hip extension bilat X 20 with red -Sidestepping with red band and monster walk with red band at counter top X 3 -Leg press DL 100# 2X15, then SL 50# 2X15 each side -Supine ankle DF and PF with green X20, EV with red 2X10  -Estim with DN, see below Manual therapy for skilled palpation and compression with Trigger Point Dry-Needling   Patient Consent Given: Yes Education handout provided: previously provided Muscles treated: left peroneal for muscle activation with Estim frequency 2 and intensity 4 to tolerance Treatment response/outcome: twitch response noted bilaterally  11/08/21 -Recumbent bike L3 X 8 min -Slantboard 30 sec X 3 -Heel and toe raises X  20 bilat -Leg press DL 100# 2X15, then SL 50# 2X15 each side -Tandem balance 30 sec X 3 on foam -Supine ankle DF and PF with green X20, EV with red 2X10  Manual therapy for skilled palpation and compression with Trigger Point Dry-Needling   Patient Consent Given:  Yes Education handout provided: previously provided Muscles treated: left peroneal for muscle activation with Estim frequency 2 and intensity 4 to tolerance Treatment response/outcome: twitch response noted bilaterally  11/03/21 -Slantboard 30 sec X 3 -Heel and toe raises X 20 bilat -Standing hip extension bilat X 20 with red -Sidestepping with red band and monster walk with red band at counter top X 3 -Supine ankle DF and PF with green X20, EV with red 2X10  -E-stim russian for motor recruitment of peroneals/Tibialis anterior 5/5 sec on/off with intensity to tolerance and active EV and DF contraction for 5 sec hold during on phase in sidelying X 10 total minutes       PATIENT EDUCATION:  Education details: exam findings and PT plan of care Person educated: Patient Education method: Explanation Education comprehension: verbalized understanding     HOME EXERCISE PROGRAM: Will review his HEP from last episode of care and revise PRN next session       ASSESSMENT:   CLINICAL IMPRESSION: : He had a good weekend after last session with muscle activation of peroneals per pt report. I measured his AROM with this which as improved significantly from 5 deg to 10 deg showing improvement. PT recommending to continue current POC and use DN with Estim or NMES russian E stimulation for motor recruitment of peroneals.   OBJECTIVE IMPAIRMENTS: decreased activity tolerance, difficulty walking, decreased balance, decreased endurance, decreased mobility, decreased ROM, decreased strength, impaired flexibility, impaired LE use, postural dysfunction, and pain.   ACTIVITY LIMITATIONS: bending, lifting, carry, locomotion, cleaning, community  activity, and or occupation   PERSONAL FACTORS: Lt peroneal nerve decompression, foot drop,2020 Colon Cancer, Neuropathy in feet, PVD, prostate cancer, hernia repair are also affecting patient's functional outcome.   REHAB POTENTIAL: Fair     CLINICAL DECISION MAKING: Stable/uncomplicated   EVALUATION COMPLEXITY: Low       GOALS: Short term PT Goals Target date: 11/21/2021 Pt will be I and compliant with HEP. Baseline:  Goal status: New Pt will decrease pain by 25% overall with prolonged standing Baseline: Goal status: New   Long term PT goals Target date: 01/16/2022 Pt will improve left ankle ROM to WFL (10 deg EV and DF) in supine to improve functional mobility Baseline: Goal status: New Pt will improve  hip/knee strength to at least 5-/5 MMT in sitting, and ankle EV strength to 4/5, ankle DF strength to 4+ to improve functional strength Baseline: Goal status: New Pt will improve FOTO to at least 69% functional to show improved function Baseline: Goal status: New Pt will reduce pain by overall 50% overall with usual activity and prolonged standing Baseline: Goal status: New Balance goal to be written in future.   PLAN: PT FREQUENCY: 1-3 times per week    PT DURATION: 6-8 weeks   PLANNED INTERVENTIONS (unless contraindicated): aquatic PT, Canalith repositioning, cryotherapy, Electrical stimulation, Iontophoresis with 4 mg/ml dexamethasome, Moist heat, traction, Ultrasound, gait training, Therapeutic exercise, balance training, neuromuscular re-education, patient/family education, prosthetic training, manual techniques, passive ROM, dry needling, taping, vasopnuematic device, vestibular, spinal manipulations, joint manipulations   PLAN FOR NEXT SESSION: DF and EV strength, balance, general leg strength, DN with stim and or [place] for peroneal recuitment    Debbe Odea, PT,DPT 11/17/2021, 1:17 PM

## 2021-11-29 ENCOUNTER — Encounter: Payer: Self-pay | Admitting: Physical Therapy

## 2021-11-29 ENCOUNTER — Ambulatory Visit (INDEPENDENT_AMBULATORY_CARE_PROVIDER_SITE_OTHER): Payer: Medicare Other | Admitting: Physical Therapy

## 2021-11-29 DIAGNOSIS — M25552 Pain in left hip: Secondary | ICD-10-CM

## 2021-11-29 DIAGNOSIS — M6281 Muscle weakness (generalized): Secondary | ICD-10-CM

## 2021-11-29 DIAGNOSIS — M79662 Pain in left lower leg: Secondary | ICD-10-CM

## 2021-11-29 DIAGNOSIS — R262 Difficulty in walking, not elsewhere classified: Secondary | ICD-10-CM | POA: Diagnosis not present

## 2021-11-29 NOTE — Therapy (Signed)
OUTPATIENT PHYSICAL THERAPY TREATMENT NOTE   Patient Name: Oscar Sparks MRN: [id number] DOB:07-10-1940, 81 y.o., male Today's Date: 11/29/2021   END OF SESSION:   PT End of Session - 11/29/21 0941     Visit Number 8    Number of Visits 20    Date for PT Re-Evaluation 01/16/22    Authorization Type MCR    Progress Note Due on Visit 10    PT Start Time 0925    PT Stop Time 1010    PT Time Calculation (min) 45 min    Activity Tolerance Patient tolerated treatment well    Behavior During Therapy [facility] for tasks assessed/performed             Past Medical History:  Diagnosis Date   Anemia    Arthritis    knees   Colon cancer ([place]) 09/24/2018   ED (erectile dysfunction)    Inguinal hernia    Neuromuscular disorder (HCC)    neuropathy in feet   Peripheral vascular disease ([place])    poor curculation in hands and feet hard to monitor with pulse oximetry   Prostate cancer ([place])    sees Dr. Gaynelle Arabian, received cesium seeds    Past Surgical History:  Procedure Laterality Date   BACK SURGERY     INGUINAL HERNIA REPAIR Right 05/20/2021   Procedure: OPEN RIGHT INGUINAL HERNIA REPAIR WITH MESH;  Surgeon: Jovita Kussmaul, MD;  Location: [place];  Service: General;  Laterality: Right;   SPINE SURGERY  2004   Dr. Sherwood Gambler   SUPERFICIAL PERONEAL NERVE RELEASE Left 06/23/2020   Procedure: left peroneal nerve neurolysis;  Surgeon: Leandrew Koyanagi, MD;  Location: 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 Yes: NPRS scale: 0/10 Pain location: left hip Pain description: inconvience Aggravating factors: prolonged standing or walking Relieving factors: rest, NSAIDS   PRECAUTIONS: Fall   WEIGHT BEARING RESTRICTIONS No   FALLS:  Has patient fallen in last 6 months? No, but has had several near falls   OCCUPATION: retired   PLOF: Independent with basic ADLs   PATIENT GOALS improve walking and balance by reducing foot drop     OBJECTIVE:  DIAGNOSTIC FINDINGS: nothing recent   PATIENT SURVEYS:  FOTO 61% functional intake at eval, goal is 69%   COGNITION:           Overall cognitive status: Within functional limits for tasks assessed                              LOWER EXTREMITY ROM:   Active ROM Right eval Left eval Left 11/17/21  Hip flexion       Hip extension       Hip abduction       Hip adduction       Hip internal rotation       Hip external rotation       Knee flexion       Knee extension       Ankle dorsiflexion   5 7  Ankle plantarflexion   WNL   Ankle inversion   WNL   Ankle eversion   5 10   (Blank rows = not tested)   LOWER EXTREMITY MMT:   MMT in sitting/supine  Right eval Left eval  Hip flexion   3+  Hip extension      Hip abduction   3  Hip adduction      Hip internal rotation      Hip external rotation      Knee flexion      Knee extension      Ankle dorsiflexion   4  Ankle plantarflexion   4  Ankle inversion   5  Ankle eversion   2+   (Blank rows = not tested)   LOWER EXTREMITY SPECIAL TESTS:      FUNCTIONAL TESTS:  10/24/21: 5 times sit to stand: 11.74 seconds without UE support   GAIT: Distance walked: Buyer, retail device utilized: Single point cane Level of assistance: Modified independence Comments: foot drop noted on left with trendelenburg       TODAY'S TREATMENT: 11/29/21 -Recumbent bike L3 X 10 min -Slantboard 30 sec X 3 -Heel and toe raises X 20 bilat -Tandem balance 30 sec X 2 bilat -Sidestepping with red band and monster walk with red band at counter top X 3 -Leg press DL 100# 2X15, then SL 50# 2X15 each side -Supine ankle DF and PF with green X20, EV with red 2X10  -E-stim russian for motor recruitment of peroneals/Tibialis anterior 5/5 sec on/off with intensity to tolerance and active EV and DF contraction for 5 sec hold during on phase in sidelying X 10 total minutes    11/17/21 -Recumbent bike L3 X 8 min -Slantboard 30 sec X 3 -Heel and toe raises X 20 bilat -Supine ankle eversion isometrics 5 sec hold 2X10 -Sidelying clams X20 for left -Sidelying reverse clams X 20 for left  Manual therapy for skilled palpation and compression with Trigger Point Dry-Needling   Patient Consent Given: Yes Education handout provided: previously provided Muscles treated: left peroneal for muscle activation with Estim frequency 2 and intensity 3-4 to tolerance Treatment response/outcome: twitch response noted bilaterally        PATIENT EDUCATION:  Education details: exam findings and PT plan of care Person educated: Patient Education method: Explanation Education comprehension: verbalized  understanding     HOME EXERCISE PROGRAM: Will review his HEP from last episode of care and revise PRN next session       ASSESSMENT:   CLINICAL IMPRESSION: : He has not had any falls since  starting PT. Progress will be slow due to the nature of his nerve injury to peroneals but he is progressing and PT recommending to continue current POC and use DN with Estim or NMES russian E stimulation for motor recruitment of peroneals.   OBJECTIVE IMPAIRMENTS: decreased activity tolerance, difficulty walking, decreased balance, decreased endurance, decreased mobility, decreased ROM, decreased strength, impaired flexibility, impaired LE use, postural dysfunction, and pain.   ACTIVITY LIMITATIONS: bending, lifting, carry, locomotion, cleaning, community activity, and or occupation   PERSONAL FACTORS: Lt peroneal nerve decompression, foot drop,2020 Colon Cancer, Neuropathy in feet, PVD, prostate cancer, hernia repair are also affecting patient's functional outcome.   REHAB POTENTIAL: Fair     CLINICAL DECISION MAKING: Stable/uncomplicated   EVALUATION COMPLEXITY: Low       GOALS: Short term PT Goals Target date: 11/21/2021 Pt will be I and compliant with HEP. Baseline:  Goal status: New Pt will decrease pain by 25% overall with prolonged standing Baseline: Goal status: New   Long term PT goals Target date: 01/16/2022 Pt will improve left ankle ROM to WFL (10 deg EV and DF) in supine to improve functional mobility Baseline: Goal status: New Pt will improve  hip/knee strength to at least 5-/5 MMT in sitting, and ankle EV strength to 4/5, ankle DF strength to 4+ to improve functional strength Baseline: Goal status: New Pt will improve FOTO to at least 69% functional to show improved function Baseline: Goal status: New Pt will reduce pain by overall 50% overall with usual activity and prolonged standing Baseline: Goal status: New Balance goal to be written in future.   PLAN: PT  FREQUENCY: 1-3 times per week    PT DURATION: 6-8 weeks   PLANNED INTERVENTIONS (unless contraindicated): aquatic PT, Canalith repositioning, cryotherapy, Electrical stimulation, Iontophoresis with 4 mg/ml dexamethasome, Moist heat, traction, Ultrasound, gait training, Therapeutic exercise, balance training, neuromuscular re-education, patient/family education, prosthetic training, manual techniques, passive ROM, dry needling, taping, vasopnuematic device, vestibular, spinal manipulations, joint manipulations   PLAN FOR NEXT SESSION: DF and EV strength, balance, general leg strength, DN with stim and or [place] for peroneal recuitment    Debbe Odea, PT,DPT 11/29/2021, 9:42 AM

## 2021-12-01 ENCOUNTER — Ambulatory Visit (INDEPENDENT_AMBULATORY_CARE_PROVIDER_SITE_OTHER): Payer: Medicare Other | Admitting: Physical Therapy

## 2021-12-01 ENCOUNTER — Encounter: Payer: Self-pay | Admitting: Physical Therapy

## 2021-12-01 DIAGNOSIS — M6281 Muscle weakness (generalized): Secondary | ICD-10-CM

## 2021-12-01 DIAGNOSIS — M79662 Pain in left lower leg: Secondary | ICD-10-CM

## 2021-12-01 DIAGNOSIS — M25552 Pain in left hip: Secondary | ICD-10-CM

## 2021-12-01 DIAGNOSIS — R262 Difficulty in walking, not elsewhere classified: Secondary | ICD-10-CM

## 2021-12-01 NOTE — Therapy (Signed)
OUTPATIENT PHYSICAL THERAPY TREATMENT NOTE   Patient Name: Oscar Sparks MRN: [id number] DOB:[birth year], 81 y.o., male Today's Date: 12/01/2021   END OF SESSION:   PT End of Session - 12/01/21 0941     Visit Number 9    Number of Visits 20    Date for PT Re-Evaluation 01/16/22    Authorization Type MCR    Progress Note Due on Visit 10    PT Start Time 0925    PT Stop Time 1010    PT Time Calculation (min) 45 min    Activity Tolerance Patient tolerated treatment well    Behavior During Therapy [facility] for tasks assessed/performed             Past Medical History:  Diagnosis Date   Anemia    Arthritis    knees   Colon cancer ([place]) 09/24/2018   ED (erectile dysfunction)    Inguinal hernia    Neuromuscular disorder (HCC)    neuropathy in feet   Peripheral vascular disease ([place])    poor curculation in hands and feet hard to monitor with pulse oximetry   Prostate cancer ([place])    sees Dr. Gaynelle Arabian, received cesium seeds    Past Surgical History:  Procedure Laterality Date   BACK SURGERY     INGUINAL HERNIA REPAIR Right 05/20/2021   Procedure: OPEN RIGHT INGUINAL HERNIA REPAIR WITH MESH;  Surgeon: Jovita Kussmaul, MD;  Location: [place];  Service: General;  Laterality: Right;   SPINE SURGERY  2004   Dr. 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 Yes: NPRS scale: 0/10 Pain location: left hip Pain description: inconvience Aggravating factors: prolonged standing or walking Relieving factors: rest, NSAIDS   PRECAUTIONS: Fall   WEIGHT BEARING RESTRICTIONS No   FALLS:  Has patient fallen in last 6 months? No, but has had several near falls   OCCUPATION: retired   PLOF: Independent with basic ADLs   PATIENT GOALS improve walking and balance by reducing foot drop     OBJECTIVE:    DIAGNOSTIC FINDINGS: nothing recent  PATIENT SURVEYS:  FOTO 61% functional intake at eval, goal is 69%   COGNITION:           Overall cognitive status: Within functional limits for tasks assessed                              LOWER EXTREMITY ROM:   Active ROM Right eval Left eval Left 11/17/21  Hip flexion       Hip extension       Hip abduction       Hip adduction       Hip internal rotation       Hip external rotation       Knee flexion       Knee extension       Ankle dorsiflexion   5 7  Ankle plantarflexion   WNL   Ankle inversion   WNL   Ankle eversion   5 10   (Blank rows = not tested)   LOWER EXTREMITY MMT:   MMT in sitting/supine Right eval Left eval   Hip flexion   3+  Hip extension      Hip abduction   3  Hip adduction      Hip internal rotation      Hip external rotation      Knee flexion      Knee extension      Ankle dorsiflexion   4  Ankle plantarflexion   4  Ankle inversion   5  Ankle eversion   2+   (Blank rows = not tested)   LOWER EXTREMITY SPECIAL TESTS:      FUNCTIONAL TESTS:  10/24/21: 5 times sit to stand: 11.74 seconds without UE support   GAIT: Distance walked: Buyer, retail device utilized: Single point cane Level of assistance: Modified independence Comments: foot drop noted on left with trendelenburg       TODAY'S TREATMENT: 12/01/21 -Recumbent bike L3 X 10 min -Slantboard 30 sec X 3 -Heel and toe raises X 20 bilat -Tandem balance 30 sec X 2 bilat -Sidestepping with red band and monster walk with red band at counter top X 3 -sit to stands slow eccentrics without UE support X 20 -Supine ankle DF and PF with green X20, EV with red 2X10  -E-stim russian for motor recruitment of peroneals/Tibialis anterior 5/5 sec on/off with intensity to tolerance and active EV and DF contraction for 5 sec hold during on phase in sidelying X 10 total minutes  11/29/21 -Recumbent bike L3 X 10 min -Slantboard 30 sec X 3 -Heel and toe raises X 20 bilat -Tandem balance 30 sec X 2 bilat -Sidestepping with red band and monster walk with red band at counter top X 3 -Leg press DL 100# 2X15, then SL 50# 2X15 each side -Supine ankle DF and PF with green X20, EV with red 2X10  -E-stim russian for motor recruitment of peroneals/Tibialis anterior 5/5 sec on/off with intensity to tolerance and active EV and DF contraction for 5 sec hold during on phase in sidelying X 10 total minutes    11/17/21 -Recumbent bike L3 X 8 min -Slantboard 30 sec X 3 -Heel and toe raises X 20 bilat -Supine ankle eversion isometrics 5 sec hold 2X10 -Sidelying clams X20 for left -Sidelying reverse clams X 20 for left  Manual therapy  for skilled palpation and compression with Trigger Point Dry-Needling   Patient Consent Given: Yes Education handout provided:  previously provided Muscles treated: left peroneal for muscle activation with Estim frequency 2 and intensity 3-4 to tolerance Treatment response/outcome: twitch response noted bilaterally        PATIENT EDUCATION:  Education details: exam findings and PT plan of care Person educated: Patient Education method: Explanation Education comprehension: verbalized understanding     HOME EXERCISE PROGRAM: Will review his HEP from last episode of care and revise PRN next session       ASSESSMENT:   CLINICAL IMPRESSION: : Continued efforts to improve Left leg strength with focus on left ankle EV and DF as he still has foot drop. He felt the best after last session with the [place] stimulation so this was performed today vs the DN with Estim.    OBJECTIVE IMPAIRMENTS: decreased activity tolerance, difficulty walking, decreased balance, decreased endurance, decreased mobility, decreased ROM, decreased strength, impaired flexibility, impaired LE use, postural dysfunction, and pain.   ACTIVITY LIMITATIONS: bending, lifting, carry, locomotion, cleaning, community activity, and or occupation   PERSONAL FACTORS: Lt peroneal nerve decompression, foot drop,2020 Colon Cancer, Neuropathy in feet, PVD, prostate cancer, hernia repair are also affecting patient's functional outcome.   REHAB POTENTIAL: Fair     CLINICAL DECISION MAKING: Stable/uncomplicated   EVALUATION COMPLEXITY: Low       GOALS: Short term PT Goals Target date: 11/21/2021 Pt will be I and compliant with HEP. Baseline:  Goal status: New Pt will decrease pain by 25% overall with prolonged standing Baseline: Goal status: New   Long term PT goals Target date: 01/16/2022 Pt will improve left ankle ROM to WFL (10 deg EV and DF) in supine to improve functional mobility Baseline: Goal status: New Pt will  improve  hip/knee strength to at least 5-/5 MMT in sitting, and ankle EV strength to 4/5, ankle DF strength to 4+ to improve functional strength Baseline: Goal status: New Pt will improve FOTO to at least 69% functional to show improved function Baseline: Goal status: New Pt will reduce pain by overall 50% overall with usual activity and prolonged standing Baseline: Goal status: New Balance goal to be written in future.   PLAN: PT FREQUENCY: 1-3 times per week    PT DURATION: 6-8 weeks   PLANNED INTERVENTIONS (unless contraindicated): aquatic PT, Canalith repositioning, cryotherapy, Electrical stimulation, Iontophoresis with 4 mg/ml dexamethasome, Moist heat, traction, Ultrasound, gait training, Therapeutic exercise, balance training, neuromuscular re-education, patient/family education, prosthetic training, manual techniques, passive ROM, dry needling, taping, vasopnuematic device, vestibular, spinal manipulations, joint manipulations   PLAN FOR NEXT SESSION: He will need 10th visit progress note. DF and EV strength, balance, general leg strength, DN with stim and or [place] for peroneal recuitment    Debbe Odea, PT,DPT 12/01/2021, 9:50 AM

## 2021-12-06 ENCOUNTER — Encounter: Payer: Self-pay | Admitting: Physical Therapy

## 2021-12-06 ENCOUNTER — Ambulatory Visit (INDEPENDENT_AMBULATORY_CARE_PROVIDER_SITE_OTHER): Payer: Medicare Other | Admitting: Physical Therapy

## 2021-12-06 DIAGNOSIS — M6281 Muscle weakness (generalized): Secondary | ICD-10-CM

## 2021-12-06 DIAGNOSIS — M25552 Pain in left hip: Secondary | ICD-10-CM

## 2021-12-06 DIAGNOSIS — M79662 Pain in left lower leg: Secondary | ICD-10-CM | POA: Diagnosis not present

## 2021-12-06 DIAGNOSIS — R262 Difficulty in walking, not elsewhere classified: Secondary | ICD-10-CM

## 2021-12-06 NOTE — Therapy (Signed)
OUTPATIENT PHYSICAL THERAPY TREATMENT NOTE Progress Note reporting period 10/24/21 to 12/06/21  See below for objective and subjective measurements relating to patients progress with PT.    Patient Name: Oscar Sparks MRN: [id number] DOB:10/24/1940, 81 y.o., male Today's Date: 12/06/2021   END OF SESSION:   PT End of Session - 12/06/21 0851     Visit Number 10    Number of Visits 20    Date for PT Re-Evaluation 01/16/22    Authorization Type MCR    Progress Note Due on Visit 65    PT Start Time [phone]    PT Stop Time 0930    PT Time Calculation (min) 44 min    Activity Tolerance Patient tolerated treatment well    Behavior During Therapy [facility] for tasks assessed/performed             Past Medical History:  Diagnosis Date   Anemia    Arthritis    knees   Colon cancer ([place]) 09/24/2018   ED (erectile dysfunction)    Inguinal hernia    Neuromuscular disorder (HCC)    neuropathy in feet   Peripheral vascular disease ([place])    poor curculation in hands and feet hard to monitor with pulse oximetry   Prostate cancer ([place])    sees Dr. Gaynelle Arabian, received cesium seeds    Past Surgical History:  Procedure Laterality Date   BACK SURGERY     INGUINAL HERNIA REPAIR Right 05/20/2021   Procedure: OPEN RIGHT INGUINAL HERNIA REPAIR WITH MESH;  Surgeon: Jovita Kussmaul, MD;  Location: [place];  Service: General;  Laterality: Right;   SPINE SURGERY  2004   Dr. 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 Denies pain upon arrival.   PERTINENT HISTORY: Lt peroneal nerve decompression, foot drop,2020 Colon Cancer, Neuropathy in feet, PVD, prostate cancer, hernia repair   PAIN:  Are you having pain? Yes: NPRS scale: 0/10 Pain location: left hip Pain description: inconvience Aggravating factors: prolonged standing or walking Relieving factors: rest, NSAIDS   PRECAUTIONS: Fall   WEIGHT BEARING RESTRICTIONS No   FALLS:  Has patient fallen in last 6 months? No, but has had several near falls   OCCUPATION: retired  PLOF: Independent with basic ADLs   PATIENT GOALS improve walking and balance by reducing foot drop     OBJECTIVE:    DIAGNOSTIC FINDINGS: nothing recent   PATIENT SURVEYS:  FOTO 61% functional intake at eval, goal is 69%   COGNITION:           Overall cognitive status: Within functional limits for tasks assessed                              LOWER EXTREMITY ROM:   Active ROM Left eval Left 11/17/21 Left 12/06/21  Hip flexion      Hip extension      Hip abduction      Hip adduction      Hip internal rotation      Hip external rotation      Knee flexion      Knee extension      Ankle dorsiflexion  '5 7 7  '$ Ankle plantarflexion WNL    Ankle inversion WNL    Ankle eversion '5 10 10   '$ (Blank rows = not tested)   LOWER EXTREMITY MMT:   MMT in sitting/supine Left eval Left 12/06/21  Hip flexion 3+ 4  Hip extension     Hip abduction 3 4  Hip adduction     Hip internal rotation     Hip external rotation     Knee flexion     Knee extension     Ankle dorsiflexion 4 4+  Ankle plantarflexion 4 4+  Ankle inversion 5 5  Ankle eversion 2+ 3s   (Blank rows = not tested)   LOWER EXTREMITY SPECIAL TESTS:      FUNCTIONAL TESTS:  10/24/21: 5 times sit to stand: 11.74 seconds without UE support   GAIT: Distance walked: Buyer, retail device utilized: Single point cane Level of assistance: Modified independence Comments: foot drop noted on left with trendelenburg       TODAY'S TREATMENT: 12/06/21 -Nu step L7 UE.LE X 8  min -Slantboard 30 sec X 3 -Heel and toe raises X 20 bilat -Tandem balance 30 sec X 2 bilat -Sidestepping with red band and monster walk with red band at counter top X 3 -Leg press DL 106# 2X15, then SL each side 50# 2X15 -Supine ankle DF and PF with green X20, EV with red 2X10  -Manual therapy for skilled palpation and compression with Trigger Point Dry-Needling   Patient Consent Given: Yes Education handout provided: previously provided Muscles treated: left peroneal for muscle activation with Estim frequency 2 and intensity 3-4 to tolerance Treatment response/outcome: twitch response noted bilaterally  12/01/21 -Recumbent bike L3 X 10 min -Slantboard 30 sec X 3 -Heel and toe raises X 20 bilat -Tandem balance 30 sec X 2 bilat -Sidestepping with red band and monster walk with red band at counter top X 3 -sit to stands slow eccentrics without UE support X 20 -Supine ankle DF and PF with green X20, EV with red 2X10  -E-stim russian for motor recruitment of peroneals/Tibialis anterior 5/5 sec on/off with intensity to tolerance and active EV and DF  contraction for 5 sec hold during on phase in sidelying X 10 total minutes  11/29/21 -Recumbent bike L3 X 10 min -Slantboard 30 sec X 3 -Heel and toe raises X 20 bilat -Tandem balance 30 sec X 2 bilat -Sidestepping with red band and monster walk with red band at counter top X 3 -Leg press  DL 100# 2X15, then SL 50# 2X15 each side -Supine ankle DF and PF with green X20, EV with red 2X10  -E-stim russian for motor recruitment of peroneals/Tibialis anterior 5/5 sec on/off with intensity to tolerance and active EV and DF contraction for 5 sec hold during on phase in sidelying X 10 total minutes    11/17/21 -Recumbent bike L3 X 8 min -Slantboard 30 sec X 3 -Heel and toe raises X 20 bilat -Supine ankle eversion isometrics 5 sec hold 2X10 -Sidelying clams X20 for left -Sidelying reverse clams X 20 for left  Manual therapy for skilled palpation and compression with Trigger Point Dry-Needling   Patient Consent Given: Yes Education handout provided: previously provided Muscles treated: left peroneal for muscle activation with Estim frequency 2 and intensity 3-4 to tolerance Treatment response/outcome: twitch response noted bilaterally        PATIENT EDUCATION:  Education details: exam findings and PT plan of care Person educated: Patient Education method: Explanation Education comprehension: verbalized understanding     HOME EXERCISE PROGRAM: Will review his HEP from last episode of care and revise PRN next session       ASSESSMENT:   CLINICAL IMPRESSION: : 10th visit progress note reflects some progress since first visit with left leg strength, and overall left ankle ROM. Progress is slow as expected due to nerve injury. PT recommending to continue PT.    OBJECTIVE IMPAIRMENTS: decreased activity tolerance, difficulty walking, decreased balance, decreased endurance, decreased mobility, decreased ROM, decreased strength, impaired flexibility, impaired LE use, postural dysfunction,  and pain.   ACTIVITY LIMITATIONS: bending, lifting, carry, locomotion, cleaning, community activity, and or occupation   PERSONAL FACTORS: Lt peroneal nerve decompression, foot drop,2020 Colon Cancer, Neuropathy in feet, PVD, prostate cancer, hernia repair are also affecting patient's functional outcome.   REHAB POTENTIAL: Fair     CLINICAL DECISION MAKING: Stable/uncomplicated   EVALUATION COMPLEXITY: Low       GOALS: Short term PT Goals Target date: 11/21/2021 Pt will be I and compliant with HEP. Baseline:  Goal status: New Pt will decrease pain by 25% overall with prolonged standing Baseline: Goal status: New   Long term PT goals Target date: 01/16/2022 Pt will improve left ankle ROM to WFL (10 deg EV and DF) in supine to improve functional mobility Baseline: Goal status: New Pt will improve  hip/knee strength to at least 5-/5 MMT in sitting, and ankle EV strength to 4/5, ankle DF strength to 4+ to improve functional strength Baseline: Goal status: New Pt will improve FOTO to at least 69% functional to show improved function Baseline: Goal status: New Pt will reduce pain by overall 50% overall with usual activity and prolonged standing Baseline: Goal status: New Balance goal to be written in future.   PLAN: PT FREQUENCY: 1-3 times per week    PT DURATION: 6-8 weeks   PLANNED INTERVENTIONS (unless contraindicated): aquatic PT, Canalith repositioning, cryotherapy, Electrical stimulation, Iontophoresis with 4 mg/ml dexamethasome, Moist heat, traction, Ultrasound, gait training, Therapeutic exercise, balance training, neuromuscular re-education, patient/family education, prosthetic training, manual techniques, passive ROM, dry needling, taping, vasopnuematic device, vestibular, spinal manipulations, joint manipulations   PLAN FOR NEXT SESSION: DF and EV strength, balance, general leg strength, DN with stim and or [place] for peroneal recuitment    Debbe Odea,  PT,DPT 12/06/2021, 8:52 AM

## 2021-12-08 ENCOUNTER — Ambulatory Visit (INDEPENDENT_AMBULATORY_CARE_PROVIDER_SITE_OTHER): Payer: Medicare Other | Admitting: Physical Therapy

## 2021-12-08 ENCOUNTER — Encounter: Payer: Self-pay | Admitting: Physical Therapy

## 2021-12-08 DIAGNOSIS — M25552 Pain in left hip: Secondary | ICD-10-CM

## 2021-12-08 DIAGNOSIS — M79662 Pain in left lower leg: Secondary | ICD-10-CM | POA: Diagnosis not present

## 2021-12-08 DIAGNOSIS — R262 Difficulty in walking, not elsewhere classified: Secondary | ICD-10-CM

## 2021-12-08 DIAGNOSIS — M6281 Muscle weakness (generalized): Secondary | ICD-10-CM | POA: Diagnosis not present

## 2021-12-08 NOTE — Therapy (Signed)
OUTPATIENT PHYSICAL THERAPY TREATMENT NOTE Progress Note reporting period 10/24/21 to 12/06/21  See below for objective and subjective measurements relating to patients progress with PT.    Patient Name: Oscar Sparks MRN: [id number] DOB:Jun 19, 1940, 81 y.o., male Today's Date: 12/08/2021   END OF SESSION:   PT End of Session - 12/08/21 1400     Visit Number 11    Number of Visits 20    Date for PT Re-Evaluation 01/16/22    Authorization Type MCR    Progress Note Due on Visit 57    PT Start Time 6060    PT Stop Time 1420    PT Time Calculation (min) 43 min    Activity Tolerance Patient tolerated treatment well    Behavior During Therapy [facility] for tasks assessed/performed             Past Medical History:  Diagnosis Date   Anemia    Arthritis    knees   Colon cancer ([place]) 09/24/2018   ED (erectile dysfunction)    Inguinal hernia    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 Gaynelle Arabian, received cesium seeds    Past Surgical History:  Procedure Laterality Date   BACK SURGERY     INGUINAL HERNIA REPAIR Right 05/20/2021   Procedure: OPEN RIGHT INGUINAL HERNIA REPAIR WITH MESH;  Surgeon: Jovita Kussmaul, MD;  Location: [place];  Service: General;  Laterality: Right;   SPINE SURGERY  2004   Dr. 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   PERTINENT HISTORY: Lt peroneal nerve decompression, foot drop,2020 Colon Cancer, Neuropathy in feet, PVD, prostate cancer, hernia repair   PAIN:  Are you having pain? Yes: NPRS scale: 0/10 Pain location: left hip Pain description: inconvience Aggravating factors: prolonged standing or walking Relieving factors: rest, NSAIDS   PRECAUTIONS: Fall   WEIGHT BEARING RESTRICTIONS No   FALLS:  Has patient fallen in last 6 months? No, but has had several near falls   OCCUPATION: retired   PLOF: Independent with basic ADLs   PATIENT GOALS improve walking and balance  by reducing foot drop     OBJECTIVE:    DIAGNOSTIC FINDINGS: nothing recent   PATIENT SURVEYS:  FOTO 61% functional intake at eval, goal is 69%   COGNITION:           Overall cognitive status: Within functional limits for tasks assessed                              LOWER EXTREMITY ROM:   Active ROM Left eval Left 11/17/21 Left 12/06/21  Hip flexion      Hip extension      Hip abduction      Hip adduction      Hip internal rotation      Hip external rotation      Knee flexion      Knee extension      Ankle dorsiflexion '5 7 7  ' Ankle plantarflexion WNL    Ankle inversion WNL    Ankle  eversion '5 10 10   ' (Blank rows = not tested)   LOWER EXTREMITY MMT:   MMT in sitting/supine Left eval Left 12/06/21  Hip flexion 3+ 4  Hip extension     Hip abduction 3 4  Hip adduction     Hip internal rotation     Hip external rotation     Knee flexion     Knee extension     Ankle dorsiflexion 4 4+  Ankle plantarflexion 4 4+  Ankle inversion 5 5  Ankle eversion 2+ 3s   (Blank rows = not tested)   LOWER EXTREMITY SPECIAL TESTS:      FUNCTIONAL TESTS:  10/24/21: 5 times sit to stand: 11.74 seconds without UE support   GAIT: Distance walked: Buyer, retail device utilized: Single point cane Level of assistance: Modified independence Comments: foot drop noted on left with trendelenburg       TODAY'S TREATMENT: 12/08/21 -Recumbent bike L3 X 9 min -Slantboard 30 sec X 3 -Heel and toe raises X 20 bilat -Tandem balance 30 sec X 2 bilat -Leg press DL 106#2X15, then SL each side 50# 2X15 -Hip hike 2X10 on leff -6 inch step ups fwd leading with left leg and one UE support 2X15 -Supine ankle DF and PF with green X20, EV with red 2X10  -E-stim russian for motor recruitment of peroneals/Tibialis anterior 5/5 sec on/off with intensity to tolerance and active EV and DF contraction for 5 sec hold during on phase in sidelying X 10 total minutes  12/06/21 -Nu step L7 UE.LE X 8  min -Slantboard 30 sec X 3 -Heel and toe raises X 20 bilat -Tandem balance 30 sec X 2 bilat -Sidestepping with red band and monster walk with red band at counter top X 3 -Leg press DL 106# 2X15, then SL each side 50# 2X15 -Supine ankle DF and PF with green X20, EV with red 2X10  -Manual therapy for skilled palpation and compression with Trigger Point Dry-Needling   Patient Consent Given: Yes Education handout provided: previously provided Muscles treated: left peroneal for muscle activation with Estim frequency 2 and intensity 3-4 to tolerance Treatment response/outcome: twitch response  noted bilaterally       PATIENT EDUCATION:  Education details: exam findings and PT plan of care Person educated: Patient Education method: Explanation Education comprehension: verbalized understanding     HOME EXERCISE PROGRAM: Will review his HEP from last episode of care and revise PRN next session  ASSESSMENT:   CLINICAL IMPRESSION: : Added hip hike and step ups today for more hip stability work which may aid him during ambulation. Continued to focus on peroneal muscle activation as tolerated.   OBJECTIVE IMPAIRMENTS: decreased activity tolerance, difficulty walking, decreased balance, decreased endurance, decreased mobility, decreased ROM, decreased strength, impaired flexibility, impaired LE use, postural dysfunction, and pain.   ACTIVITY LIMITATIONS: bending, lifting, carry, locomotion, cleaning, community activity, and or occupation   PERSONAL FACTORS: Lt peroneal nerve decompression, foot drop,2020 Colon Cancer, Neuropathy in feet, PVD, prostate cancer, hernia repair are also affecting patient's functional outcome.   REHAB POTENTIAL: Fair     CLINICAL DECISION MAKING: Stable/uncomplicated   EVALUATION COMPLEXITY: Low       GOALS: Short term PT Goals Target date: 11/21/2021 Pt will be I and compliant with HEP. Baseline:  Goal status: MET Pt will decrease pain by 25% overall with prolonged standing Baseline: Goal status:MET   Long term PT goals Target date: 01/16/2022 Pt will improve left ankle ROM to WFL (10 deg EV and DF) in supine to improve functional mobility Baseline: Goal status: ongoing Pt will improve  hip/knee strength to at least 5-/5 MMT in sitting, and ankle EV strength to 4/5, ankle DF strength to 4+ to improve functional strength Baseline: Goal status: ongoing Pt will improve FOTO to at least 69% functional to show improved function Baseline: Goal status: ongoing Pt will reduce pain by overall 50% overall with usual activity and prolonged  standing Baseline: Goal status: onging    PLAN: PT FREQUENCY: 1-3 times per week    PT DURATION: 6-8 weeks   PLANNED INTERVENTIONS (unless contraindicated): aquatic PT, Canalith repositioning, cryotherapy, Electrical stimulation, Iontophoresis with 4 mg/ml dexamethasome, Moist heat, traction, Ultrasound, gait training, Therapeutic exercise, balance training, neuromuscular re-education, patient/family education, prosthetic training, manual techniques, passive ROM, dry needling, taping, vasopnuematic device, vestibular, spinal manipulations, joint manipulations   PLAN FOR NEXT SESSION: DF and EV strength, balance, general leg strength, DN with stim and or [place] for peroneal recuitment    Debbe Odea, PT,DPT 12/08/2021, 2:03 PM

## 2021-12-12 ENCOUNTER — Encounter: Payer: Self-pay | Admitting: Physical Therapy

## 2021-12-12 ENCOUNTER — Ambulatory Visit (INDEPENDENT_AMBULATORY_CARE_PROVIDER_SITE_OTHER): Payer: Medicare Other | Admitting: Physical Therapy

## 2021-12-12 DIAGNOSIS — M25552 Pain in left hip: Secondary | ICD-10-CM

## 2021-12-12 DIAGNOSIS — R262 Difficulty in walking, not elsewhere classified: Secondary | ICD-10-CM

## 2021-12-12 DIAGNOSIS — M6281 Muscle weakness (generalized): Secondary | ICD-10-CM | POA: Diagnosis not present

## 2021-12-12 DIAGNOSIS — M79662 Pain in left lower leg: Secondary | ICD-10-CM

## 2021-12-12 NOTE — Therapy (Signed)
OUTPATIENT PHYSICAL THERAPY TREATMENT NOTE Progress Note reporting period 10/24/21 to 12/06/21  See below for objective and subjective measurements relating to patients progress with PT.    Patient Name: Oscar Sparks MRN: [id number] DOB:1941-02-13, 81 y.o., male Today's Date: 12/12/2021   END OF SESSION:   PT End of Session - 12/12/21 0838     Visit Number 12    Number of Visits 20    Date for PT Re-Evaluation 01/16/22    Authorization Type MCR    Progress Note Due on Visit 20    PT Start Time 0800    PT Stop Time 0845    PT Time Calculation (min) 45 min    Activity Tolerance Patient tolerated treatment well    Behavior During Therapy [facility] for tasks assessed/performed             Past Medical History:  Diagnosis Date   Anemia    Arthritis    knees   Colon cancer ([place]) 09/24/2018   ED (erectile dysfunction)    Inguinal hernia    Neuromuscular disorder (HCC)    neuropathy in feet   Peripheral vascular disease ([place])    poor curculation in hands and feet hard to monitor with pulse oximetry   Prostate cancer ([place])    sees Dr. Gaynelle Arabian, received cesium seeds    Past Surgical History:  Procedure Laterality Date   BACK SURGERY     INGUINAL HERNIA REPAIR Right 05/20/2021   Procedure: OPEN RIGHT INGUINAL HERNIA REPAIR WITH MESH;  Surgeon: Jovita Kussmaul, MD;  Location: [place];  Service: General;  Laterality: Right;   SPINE SURGERY  2004   Dr. 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   PERTINENT HISTORY: Lt peroneal nerve decompression, foot drop,2020 Colon Cancer, Neuropathy in feet, PVD, prostate cancer, hernia repair   PAIN:  Are you having pain? Yes: NPRS scale: 0/10 Pain location: left hip Pain description: inconvience Aggravating factors: prolonged standing or walking Relieving factors: rest, NSAIDS   PRECAUTIONS: Fall   WEIGHT BEARING RESTRICTIONS No   FALLS:  Has patient fallen in last 6 months? No, but has had several near falls   OCCUPATION: retired   PLOF: Independent with basic ADLs   PATIENT GOALS improve walking and balance by reducing foot  drop     OBJECTIVE:    DIAGNOSTIC FINDINGS: nothing recent   PATIENT SURVEYS:  FOTO 61% functional intake at eval, goal is 69%   COGNITION:           Overall cognitive status: Within functional limits for tasks assessed                              LOWER EXTREMITY ROM:   Active ROM Left eval Left 11/17/21 Left 12/06/21  Hip flexion      Hip extension      Hip abduction      Hip adduction      Hip internal rotation      Hip external rotation      Knee flexion      Knee extension      Ankle dorsiflexion _0 Ankle plantarflexion WNL    Ankle inversion WNL    Ankle eversion _1 (Blank rows = not tested)   LOWER EXTREMITY MMT:   MMT in sitting/supine Left eval Left 12/06/21  Hip flexion 3+ 4  Hip extension     Hip abduction 3 4  Hip adduction     Hip internal rotation     Hip external rotation     Knee flexion     Knee extension     Ankle dorsiflexion 4 4+  Ankle plantarflexion 4 4+  Ankle inversion 5 5  Ankle eversion 2+ 3s   (Blank rows = not tested)   LOWER EXTREMITY SPECIAL TESTS:      FUNCTIONAL TESTS:  10/24/21: 5 times sit to stand: 11.74 seconds without UE support   GAIT: Distance walked: Buyer, retail device utilized: Single point cane Level of assistance: Modified independence Comments: foot drop noted on left with trendelenburg       TODAY'S TREATMENT: 12/12/21 -Recumbent bike L3 X 9 min -Slantboard 30 sec X 3 -Heel and toe raises X 20 bilat -Tandem balance 30 sec X 2 bilat -Leg press DL 106#2X15, then SL each side 50# 2X15 -Sidestepping with red band and monster walk with red band at counter top X 3 -Supine ankle DF and PF with green X20, EV with red 2X10  -E-stim russian for motor recruitment of peroneals/Tibialis anterior 5/5 sec on/off with intensity to tolerance and active EV and DF contraction/AROM for 5 sec hold during on phase in sidelying X 10 total minutes  12/08/21 -Recumbent bike L3 X 9 min -Slantboard 30 sec X 3 -Heel and toe raises X 20 bilat -Tandem balance 30 sec X 2 bilat -Leg press DL 106#2X15, then SL each side 50# 2X15 -Hip hike 2X10 on leff -6 inch step ups fwd leading with left leg and one UE support 2X15 -Supine ankle DF and PF with green X20, EV with red 2X10  -E-stim russian for motor recruitment of peroneals/Tibialis anterior 5/5 sec on/off with intensity to tolerance and active EV and DF contraction for 5 sec hold during on phase in sidelying X 10 total minutes  12/06/21 -Nu step L7 UE.LE X 8  min -Slantboard 30 sec X 3 -Heel and toe raises X 20 bilat -Tandem balance 30 sec X 2  bilat -Sidestepping with red band and monster walk with red band at counter top X 3 -Leg press DL 106# 2X15, then SL each side 50# 2X15 -Supine ankle DF and PF with green X20, EV with red 2X10  -Manual  therapy for skilled palpation and compression with Trigger Point Dry-Needling   Patient Consent Given: Yes Education handout provided: previously provided Muscles treated: left peroneal for muscle activation with Estim frequency 2 and intensity 3-4 to tolerance Treatment response/outcome: twitch response noted bilaterally       PATIENT EDUCATION:  Education details: exam findings and PT plan of care Person educated: Patient Education method: Explanation Education comprehension: verbalized understanding     HOME EXERCISE PROGRAM: Will review his HEP from last episode of care and revise PRN next session       ASSESSMENT:   CLINICAL IMPRESSION: : he felt like the NMES may by slightly more effective but does like having a mix of this and DN interventions. Preferred to have NMES stimulation today so this was performed to try to maximize contraction of ankle DF and everters. He had good tolerance to session without complaints of increased pain.   OBJECTIVE IMPAIRMENTS: decreased activity tolerance, difficulty walking, decreased balance, decreased endurance, decreased mobility, decreased ROM, decreased strength, impaired flexibility, impaired LE use, postural dysfunction, and pain.   ACTIVITY LIMITATIONS: bending, lifting, carry, locomotion, cleaning, community activity, and or occupation   PERSONAL FACTORS: Lt peroneal nerve decompression, foot drop,2020 Colon Cancer, Neuropathy in feet, PVD, prostate cancer, hernia repair are also affecting patient's functional outcome.   REHAB POTENTIAL: Fair     CLINICAL DECISION MAKING: Stable/uncomplicated   EVALUATION COMPLEXITY: Low       GOALS: Short term PT Goals Target date: 11/21/2021 Pt will be I and compliant with HEP. Baseline:   Goal status: MET Pt will decrease pain by 25% overall with prolonged standing Baseline: Goal status:MET   Long term PT goals Target date: 01/16/2022 Pt will improve left ankle ROM to WFL (10 deg EV and DF) in supine to improve functional mobility Baseline: Goal status: ongoing Pt will improve  hip/knee strength to at least 5-/5 MMT in sitting, and ankle EV strength to 4/5, ankle DF strength to 4+ to improve functional strength Baseline: Goal status: ongoing Pt will improve FOTO to at least 69% functional to show improved function Baseline: Goal status: ongoing Pt will reduce pain by overall 50% overall with usual activity and prolonged standing Baseline: Goal status: onging    PLAN: PT FREQUENCY: 1-3 times per week    PT DURATION: 6-8 weeks   PLANNED INTERVENTIONS (unless contraindicated): aquatic PT, Canalith repositioning, cryotherapy, Electrical stimulation, Iontophoresis with 4 mg/ml dexamethasome, Moist heat, traction, Ultrasound, gait training, Therapeutic exercise, balance training, neuromuscular re-education, patient/family education, prosthetic training, manual techniques, passive ROM, dry needling, taping, vasopnuematic device, vestibular, spinal manipulations, joint manipulations   PLAN FOR NEXT SESSION: DF and EV strength, balance, general leg strength, DN with stim and or [place] for peroneal recuitment    Debbe Odea, PT,DPT 12/12/2021, 8:40 AM

## 2021-12-20 ENCOUNTER — Encounter: Payer: Self-pay | Admitting: Physical Therapy

## 2021-12-20 ENCOUNTER — Ambulatory Visit (INDEPENDENT_AMBULATORY_CARE_PROVIDER_SITE_OTHER): Payer: Medicare Other | Admitting: Physical Therapy

## 2021-12-20 DIAGNOSIS — M6281 Muscle weakness (generalized): Secondary | ICD-10-CM | POA: Diagnosis not present

## 2021-12-20 DIAGNOSIS — M79662 Pain in left lower leg: Secondary | ICD-10-CM

## 2021-12-20 DIAGNOSIS — R262 Difficulty in walking, not elsewhere classified: Secondary | ICD-10-CM

## 2021-12-20 DIAGNOSIS — M25552 Pain in left hip: Secondary | ICD-10-CM

## 2021-12-20 NOTE — Therapy (Signed)
OUTPATIENT PHYSICAL THERAPY TREATMENT NOTE    Patient Name: Oscar Sparks MRN: [id number] DOB:25-May-1940, 81 y.o., male Today's Date: 12/20/2021   END OF SESSION:   PT End of Session - 12/20/21 0854     Visit Number 13    Number of Visits 20    Date for PT Re-Evaluation 01/16/22    Authorization Type MCR    Progress Note Due on Visit 33    PT Start Time 0845    PT Stop Time 0930    PT Time Calculation (min) 45 min    Activity Tolerance Patient tolerated treatment well    Behavior During Therapy [facility] for tasks assessed/performed             Past Medical History:  Diagnosis Date   Anemia    Arthritis    knees   Colon cancer ([place]) 09/24/2018   ED (erectile dysfunction)    Inguinal hernia    Neuromuscular disorder (HCC)    neuropathy in feet   Peripheral vascular disease ([place])    poor curculation in hands and feet hard to monitor with pulse oximetry   Prostate cancer ([place])    sees Dr. Gaynelle Arabian, received cesium seeds    Past Surgical History:  Procedure Laterality Date   BACK SURGERY     INGUINAL HERNIA REPAIR Right 05/20/2021   Procedure: OPEN RIGHT INGUINAL HERNIA REPAIR WITH MESH;  Surgeon: Jovita Kussmaul, MD;  Location: [place];  Service: General;  Laterality: Right;   SPINE SURGERY  2004   Dr. 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   PERTINENT HISTORY: Lt peroneal nerve decompression, foot drop,2020 Colon Cancer, Neuropathy in feet, PVD, prostate cancer, hernia repair   PAIN:  Are you having pain? Yes: NPRS scale: 0/10 Pain location: left hip Pain description: inconvience Aggravating factors: prolonged standing or walking Relieving factors: rest, NSAIDS   PRECAUTIONS: Fall   WEIGHT BEARING RESTRICTIONS No   FALLS:  Has patient fallen in last 6 months? No, but has had several near falls   OCCUPATION: retired   PLOF: Independent with basic ADLs   PATIENT GOALS improve walking and balance by reducing foot drop     OBJECTIVE:    DIAGNOSTIC FINDINGS: nothing recent   PATIENT  SURVEYS:  FOTO 61% functional intake at eval, goal is 69%   COGNITION:           Overall cognitive status: Within functional limits for tasks assessed                              LOWER EXTREMITY ROM:   Active ROM Left eval Left 11/17/21 Left 12/06/21  Hip flexion      Hip extension      Hip abduction      Hip adduction      Hip internal rotation      Hip external rotation      Knee flexion      Knee extension      Ankle dorsiflexion '5 7 7  ' Ankle plantarflexion WNL    Ankle inversion WNL    Ankle eversion '5 10 10   ' (Blank rows = not tested)   LOWER EXTREMITY MMT:   MMT in sitting/supine Left eval Left 12/06/21   Hip flexion 3+ 4  Hip extension     Hip abduction 3 4  Hip adduction     Hip internal rotation     Hip external rotation     Knee flexion     Knee extension     Ankle dorsiflexion 4 4+  Ankle plantarflexion 4 4+  Ankle inversion 5 5  Ankle eversion 2+ 3s   (Blank rows = not tested)   LOWER EXTREMITY SPECIAL TESTS:      FUNCTIONAL TESTS:  10/24/21: 5 times sit to stand: 11.74 seconds without UE support   GAIT: Distance walked: Buyer, retail device utilized: Single point cane Level of assistance: Modified independence Comments: foot drop noted on left with trendelenburg       TODAY'S TREATMENT: 12/20/21 -Recumbent bike L3 X 8 min -Slantboard 30 sec X 3 -Heel and toe raises 2X15 bilat -Tandem balance 30 sec X 2 bilat -Leg press DL 106#2X15, then SL each side 50# 2X15 -Sidestepping with red band and monster walk with red band at counter top X 3 -Supine ankle DF and PF with green X20, EV with red 2X10  --Manual therapy for skilled palpation and compression with Trigger Point Dry-Needling   Patient Consent Given: Yes Education handout provided: previously provided Muscles treated: left peroneal for muscle activation with Estim frequency 2 and intensity 3-4 to tolerance Treatment response/outcome: twitch response noted bilaterally  12/12/21 -Recumbent bike L3 X 9 min -Slantboard 30 sec X 3 -Heel and toe raises X 20 bilat -Tandem balance 30 sec X 2 bilat -Leg press DL 106#2X15, then SL each side 50# 2X15 -Sidestepping with red band and monster walk with red band at counter top X 3 -Supine ankle DF and PF with green X20, EV with red 2X10  -E-stim russian for motor recruitment of peroneals/Tibialis anterior 5/5 sec on/off with intensity to tolerance and active EV and DF contraction/AROM for 5 sec hold during on phase in sidelying X 10 total minutes       PATIENT EDUCATION:  Education details: exam findings and PT plan of care Person educated:  Patient Education method: Explanation Education comprehension: verbalized understanding     HOME EXERCISE PROGRAM: Will review his HEP from last episode of care and revise PRN next session       ASSESSMENT:   CLINICAL IMPRESSION: : he wanted to try DN today with Estim for neuromotor facillitation to peroneals. He had good tolerance to this  but did have more pain than usual with needle insertion. This may be a sign of nerve regeneration if his sensation is improving.   OBJECTIVE IMPAIRMENTS: decreased activity tolerance, difficulty walking, decreased balance, decreased endurance, decreased mobility, decreased ROM, decreased strength, impaired flexibility, impaired LE use, postural dysfunction, and pain.   ACTIVITY LIMITATIONS: bending, lifting, carry, locomotion, cleaning, community activity, and or occupation   PERSONAL FACTORS: Lt peroneal nerve decompression, foot drop,2020 Colon Cancer, Neuropathy in feet, PVD, prostate cancer, hernia repair are also affecting patient's functional outcome.   REHAB POTENTIAL: Fair     CLINICAL DECISION MAKING: Stable/uncomplicated   EVALUATION COMPLEXITY: Low       GOALS: Short term PT Goals Target date: 11/21/2021 Pt will be I and compliant with HEP. Baseline:  Goal status: MET Pt will decrease pain by 25% overall with prolonged standing Baseline: Goal status:MET   Long term PT goals Target date: 01/16/2022 Pt will improve left ankle ROM to WFL (10 deg EV and DF) in supine to improve functional mobility Baseline: Goal status: ongoing Pt will improve  hip/knee strength to at least 5-/5 MMT in sitting, and ankle EV strength to 4/5, ankle DF strength to 4+ to improve functional strength Baseline: Goal status: ongoing Pt will improve FOTO to at least 69% functional to show improved function Baseline: Goal status: ongoing Pt will reduce pain by overall 50% overall with usual activity and prolonged standing Baseline: Goal status:  onging    PLAN: PT FREQUENCY: 1-3 times per week    PT DURATION: 6-8 weeks   PLANNED INTERVENTIONS (unless contraindicated): aquatic PT, Canalith repositioning, cryotherapy, Electrical stimulation, Iontophoresis with 4 mg/ml dexamethasome, Moist heat, traction, Ultrasound, gait training, Therapeutic exercise, balance training, neuromuscular re-education, patient/family education, prosthetic training, manual techniques, passive ROM, dry needling, taping, vasopnuematic device, vestibular, spinal manipulations, joint manipulations   PLAN FOR NEXT SESSION: DF and EV strength, balance, general leg strength, DN with stim and or [place] for peroneal recuitment    Debbe Odea, PT,DPT 12/20/2021, 9:20 AM

## 2021-12-21 DIAGNOSIS — L570 Actinic keratosis: Secondary | ICD-10-CM | POA: Diagnosis not present

## 2021-12-21 DIAGNOSIS — Z08 Encounter for follow-up examination after completed treatment for malignant neoplasm: Secondary | ICD-10-CM | POA: Diagnosis not present

## 2021-12-21 DIAGNOSIS — Z85820 Personal history of malignant melanoma of skin: Secondary | ICD-10-CM | POA: Diagnosis not present

## 2021-12-21 DIAGNOSIS — D225 Melanocytic nevi of trunk: Secondary | ICD-10-CM | POA: Diagnosis not present

## 2021-12-21 DIAGNOSIS — X32XXXD Exposure to sunlight, subsequent encounter: Secondary | ICD-10-CM | POA: Diagnosis not present

## 2021-12-21 DIAGNOSIS — Z1283 Encounter for screening for malignant neoplasm of skin: Secondary | ICD-10-CM | POA: Diagnosis not present

## 2021-12-22 ENCOUNTER — Encounter: Payer: Self-pay | Admitting: Physical Therapy

## 2021-12-22 ENCOUNTER — Ambulatory Visit (INDEPENDENT_AMBULATORY_CARE_PROVIDER_SITE_OTHER): Payer: Medicare Other | Admitting: Physical Therapy

## 2021-12-22 DIAGNOSIS — M6281 Muscle weakness (generalized): Secondary | ICD-10-CM | POA: Diagnosis not present

## 2021-12-22 DIAGNOSIS — M79662 Pain in left lower leg: Secondary | ICD-10-CM

## 2021-12-22 DIAGNOSIS — M25552 Pain in left hip: Secondary | ICD-10-CM

## 2021-12-22 DIAGNOSIS — R262 Difficulty in walking, not elsewhere classified: Secondary | ICD-10-CM

## 2021-12-22 NOTE — Therapy (Signed)
OUTPATIENT PHYSICAL THERAPY TREATMENT NOTE    Patient Name: Oscar Sparks MRN: [id number] DOB:12/18/1940, 81 y.o., male Today's Date: 12/22/2021   END OF SESSION:   PT End of Session - 12/22/21 0844     Visit Number 14    Number of Visits 20    Date for PT Re-Evaluation 01/16/22    Authorization Type MCR    Progress Note Due on Visit 58    PT Start Time 0840    PT Stop Time 0925    PT Time Calculation (min) 45 min    Activity Tolerance Patient tolerated treatment well    Behavior During Therapy [facility] for tasks assessed/performed             Past Medical History:  Diagnosis Date   Anemia    Arthritis    knees   Colon cancer ([place]) 09/24/2018   ED (erectile dysfunction)    Inguinal hernia    Neuromuscular disorder (HCC)    neuropathy in feet   Peripheral vascular disease (HCC)    poor curculation in hands and feet hard to monitor with pulse oximetry   Prostate cancer ([place])    sees Dr. Gaynelle Arabian, received cesium seeds    Past Surgical History:  Procedure Laterality Date   BACK SURGERY     INGUINAL HERNIA REPAIR Right 05/20/2021   Procedure: OPEN RIGHT INGUINAL HERNIA REPAIR WITH MESH;  Surgeon: Jovita Kussmaul, MD;  Location: [place];  Service: General;  Laterality: Right;   SPINE SURGERY  2004   Dr. 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 He felt the DN last time helped to awaken the nerve some.    PERTINENT HISTORY: Lt peroneal nerve decompression, foot drop,2020 Colon Cancer, Neuropathy in feet, PVD, prostate cancer, hernia repair   PAIN:  Are you having pain? Yes: NPRS scale: 0/10 Pain location: left hip Pain description: inconvience Aggravating factors: prolonged standing or walking Relieving factors: rest, NSAIDS   PRECAUTIONS: Fall   WEIGHT BEARING RESTRICTIONS No   FALLS:  Has patient fallen in last 6 months? No, but has had several near falls   OCCUPATION: retired   PLOF: Independent with basic ADLs   PATIENT GOALS improve walking and balance by reducing foot drop  OBJECTIVE:    DIAGNOSTIC FINDINGS: nothing recent   PATIENT SURVEYS:  FOTO 61% functional intake at eval, goal is 69%   COGNITION:           Overall cognitive status: Within functional limits for tasks assessed                              LOWER EXTREMITY ROM:   Active ROM Left eval Left 11/17/21 Left 12/06/21  Hip flexion      Hip extension      Hip abduction      Hip adduction      Hip internal rotation      Hip external rotation      Knee flexion      Knee extension      Ankle dorsiflexion _0 Ankle plantarflexion WNL    Ankle inversion WNL    Ankle eversion _1 (Blank rows = not tested)   LOWER EXTREMITY  MMT:   MMT in sitting/supine Left eval Left 12/06/21  Hip flexion 3+ 4  Hip extension     Hip abduction 3 4  Hip adduction     Hip internal rotation     Hip external rotation     Knee flexion     Knee extension     Ankle dorsiflexion 4 4+  Ankle plantarflexion 4 4+  Ankle inversion 5 5  Ankle eversion 2+ 3s   (Blank rows = not tested)   LOWER EXTREMITY SPECIAL TESTS:      FUNCTIONAL TESTS:  10/24/21: 5 times sit to stand: 11.74 seconds without UE support   GAIT: Distance walked: Buyer, retail device utilized: Single point cane Level of assistance: Modified independence Comments: foot drop noted on left with trendelenburg       TODAY'S TREATMENT: 12/22/21 -Recumbent bike L3 X 8 min -Slantboard 30 sec X 3 -Heel and toe raises 2X15 bilat -Tandem balance 30 sec X 2 bilat -Sit to stands with UE support pushing up with hands on knees X10 -Sidestepping with red band and monster walk with red band at counter top X 3 -Supine ankle DF and PF with green X20, EV with red 2X10  --Manual therapy for skilled palpation and compression with Trigger Point Dry-Needling   Patient Consent Given: Yes Education handout provided: previously provided Muscles treated: left peroneal for muscle activation with Estim frequency 2 and intensity 3-4 to tolerance Treatment response/outcome: twitch response noted bilaterally  12/20/21 -Recumbent bike L3 X 8 min -Slantboard 30 sec X 3 -Heel and toe raises 2X15 bilat -Tandem balance 30 sec X 2 bilat -Leg press DL 106#2X15, then SL each side 50# 2X15 -Sidestepping with red band and monster walk with red band at counter top X 3 -Supine ankle DF and PF with green X20, EV with red 2X10  --Manual therapy for skilled palpation and compression with Trigger Point Dry-Needling   Patient Consent Given: Yes Education handout provided: previously provided Muscles treated: left peroneal for muscle activation with Estim frequency 2 and  intensity 3-4 to tolerance Treatment response/outcome: twitch response noted bilaterally  12/12/21 -Recumbent bike L3 X 9 min -Slantboard 30 sec X 3 -Heel and toe raises X 20 bilat -Tandem balance 30 sec X 2 bilat -Leg press DL 106#2X15, then SL each side 50# 2X15 -Sidestepping with red band and monster walk with red band at counter top X 3 -Supine ankle DF and PF  with green X20, EV with red 2X10  -E-stim russian for motor recruitment of peroneals/Tibialis anterior 5/5 sec on/off with intensity to tolerance and active EV and DF contraction/AROM for 5 sec hold during on phase in sidelying X 10 total minutes       PATIENT EDUCATION:  Education details: exam findings and PT plan of care Person educated: Patient Education method: Explanation Education comprehension: verbalized understanding     HOME EXERCISE PROGRAM: Will review his HEP from last episode of care and revise PRN next session       ASSESSMENT:   CLINICAL IMPRESSION: : Continued with DN today with Estim for neuromotor facillitation to peroneals. He had good tolerance and twitch responses to this. He expresses some improvements in overall gait over the last week.   OBJECTIVE IMPAIRMENTS: decreased activity tolerance, difficulty walking, decreased balance, decreased endurance, decreased mobility, decreased ROM, decreased strength, impaired flexibility, impaired LE use, postural dysfunction, and pain.   ACTIVITY LIMITATIONS: bending, lifting, carry, locomotion, cleaning, community activity, and or occupation   PERSONAL FACTORS: Lt peroneal nerve decompression, foot drop,2020 Colon Cancer, Neuropathy in feet, PVD, prostate cancer, hernia repair are also affecting patient's functional outcome.   REHAB POTENTIAL: Fair     CLINICAL DECISION MAKING: Stable/uncomplicated   EVALUATION COMPLEXITY: Low       GOALS: Short term PT Goals Target date: 11/21/2021 Pt will be I and compliant with HEP. Baseline:  Goal status:  MET Pt will decrease pain by 25% overall with prolonged standing Baseline: Goal status:MET   Long term PT goals Target date: 01/16/2022 Pt will improve left ankle ROM to WFL (10 deg EV and DF) in supine to improve functional mobility Baseline: Goal status: ongoing Pt will improve  hip/knee strength to at least 5-/5 MMT in sitting, and ankle EV strength to 4/5, ankle DF strength to 4+ to improve functional strength Baseline: Goal status: ongoing Pt will improve FOTO to at least 69% functional to show improved function Baseline: Goal status: ongoing Pt will reduce pain by overall 50% overall with usual activity and prolonged standing Baseline: Goal status: onging    PLAN: PT FREQUENCY: 1-3 times per week    PT DURATION: 6-8 weeks   PLANNED INTERVENTIONS (unless contraindicated): aquatic PT, Canalith repositioning, cryotherapy, Electrical stimulation, Iontophoresis with 4 mg/ml dexamethasome, Moist heat, traction, Ultrasound, gait training, Therapeutic exercise, balance training, neuromuscular re-education, patient/family education, prosthetic training, manual techniques, passive ROM, dry needling, taping, vasopnuematic device, vestibular, spinal manipulations, joint manipulations   PLAN FOR NEXT SESSION: DF and EV strength, balance, general leg strength, DN with stim and or [place] for peroneal recuitment    Debbe Odea, PT,DPT 12/22/2021, 8:47 AM

## 2021-12-26 ENCOUNTER — Ambulatory Visit (INDEPENDENT_AMBULATORY_CARE_PROVIDER_SITE_OTHER): Payer: Medicare Other | Admitting: Physical Therapy

## 2021-12-26 ENCOUNTER — Encounter: Payer: Self-pay | Admitting: Physical Therapy

## 2021-12-26 DIAGNOSIS — R262 Difficulty in walking, not elsewhere classified: Secondary | ICD-10-CM | POA: Diagnosis not present

## 2021-12-26 DIAGNOSIS — M25552 Pain in left hip: Secondary | ICD-10-CM | POA: Diagnosis not present

## 2021-12-26 DIAGNOSIS — M6281 Muscle weakness (generalized): Secondary | ICD-10-CM | POA: Diagnosis not present

## 2021-12-26 DIAGNOSIS — M79662 Pain in left lower leg: Secondary | ICD-10-CM | POA: Diagnosis not present

## 2021-12-26 NOTE — Therapy (Signed)
OUTPATIENT PHYSICAL THERAPY TREATMENT NOTE    Patient Name: Oscar Sparks MRN: [id number] DOB:Mar 11, 1941, 81 y.o., male Today's Date: 12/26/2021   END OF SESSION:   PT End of Session - 12/26/21 1000     Visit Number 15    Number of Visits 20    Date for PT Re-Evaluation 01/16/22    Authorization Type MCR    Progress Note Due on Visit 64    PT Start Time 0924    PT Stop Time 1009    PT Time Calculation (min) 45 min    Activity Tolerance Patient tolerated treatment well    Behavior During Therapy [facility] for tasks assessed/performed             Past Medical History:  Diagnosis Date   Anemia    Arthritis    knees   Colon cancer ([place]) 09/24/2018   ED (erectile dysfunction)    Inguinal hernia    Neuromuscular disorder (HCC)    neuropathy in feet   Peripheral vascular disease ([place])    poor curculation in hands and feet hard to monitor with pulse oximetry   Prostate cancer ([place])    sees Dr. Gaynelle Arabian, received cesium seeds    Past Surgical History:  Procedure Laterality Date   BACK SURGERY     INGUINAL HERNIA REPAIR Right 05/20/2021   Procedure: OPEN RIGHT INGUINAL HERNIA REPAIR WITH MESH;  Surgeon: Jovita Kussmaul, MD;  Location: [place];  Service: General;  Laterality: Right;   SPINE SURGERY  2004   Dr. 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    PERTINENT HISTORY: Lt peroneal nerve decompression, foot drop,2020 Colon Cancer, Neuropathy in feet, PVD, prostate cancer, hernia repair   PAIN:  Are you having pain? Yes: NPRS scale: 0/10 Pain location: left hip Pain description: inconvience Aggravating factors: prolonged standing or walking Relieving factors: rest, NSAIDS   PRECAUTIONS: Fall   WEIGHT BEARING RESTRICTIONS No   FALLS:  Has patient fallen in last 6 months? No, but has had several near falls   OCCUPATION: retired   PLOF: Independent with basic ADLs   PATIENT GOALS improve walking and balance by reducing foot drop     OBJECTIVE:  DIAGNOSTIC FINDINGS: nothing recent   PATIENT SURVEYS:  FOTO 61% functional intake at eval, goal is 69%   COGNITION:           Overall cognitive status: Within functional limits for tasks assessed                              LOWER EXTREMITY ROM:   Active ROM Left eval Left 11/17/21 Left 12/06/21  Hip flexion      Hip extension      Hip abduction      Hip adduction      Hip internal rotation      Hip external rotation      Knee flexion      Knee extension      Ankle dorsiflexion '5 7 7  ' Ankle plantarflexion WNL    Ankle inversion WNL    Ankle eversion '5 10 10   ' (Blank rows = not tested)   LOWER EXTREMITY MMT:   MMT  in sitting/supine Left eval Left 12/06/21  Hip flexion 3+ 4  Hip extension     Hip abduction 3 4  Hip adduction     Hip internal rotation     Hip external rotation     Knee flexion     Knee extension     Ankle dorsiflexion 4 4+  Ankle plantarflexion 4 4+  Ankle inversion 5 5  Ankle eversion 2+ 3s   (Blank rows = not tested)   LOWER EXTREMITY SPECIAL TESTS:      FUNCTIONAL TESTS:  10/24/21: 5 times sit to stand: 11.74 seconds without UE support   GAIT: Distance walked: Buyer, retail device utilized: Single point cane Level of assistance: Modified independence Comments: foot drop noted on left with trendelenburg       TODAY'S TREATMENT: 12/26/21 -Nu step L 7 seat#12 X 10 min -Slantboard 30 sec X 3 -Heel and toe raises X 20 bilat -Tandem balance 30 sec X 2 bilat -Leg press DL 106#2X15, then SL each side 50# 2X15 -Sidestepping with red band and monster walk with red band at counter top X 3 -Supine ankle DF and PF with green X20, EV with red 2X10  -E-stim russian/NMES for motor recruitment of peroneals/Tibialis anterior 5/5 sec on/off with intensity to tolerance and active EV and DF contraction/AROM for 5 sec hold during on phase in sidelying X 10 total minutes  12/22/21 -Recumbent bike L3 X 8 min -Slantboard 30 sec X 3 -Heel and toe raises 2X15 bilat -Tandem balance 30 sec X 2 bilat -Sit to stands with UE support pushing up with hands on knees X10 -Sidestepping with red band and monster walk with red band at counter top X 3 -Supine ankle DF and PF with green X20, EV with red 2X10  --Manual therapy for skilled palpation and compression with Trigger Point Dry-Needling   Patient Consent Given: Yes Education handout provided: previously provided Muscles treated: left peroneal for muscle activation with Estim frequency 2 and intensity 3-4 to tolerance Treatment response/outcome: twitch response noted bilaterally  12/20/21 -Recumbent bike L3 X 8  min -Slantboard 30 sec X 3 -Heel and toe raises 2X15 bilat -Tandem balance 30 sec X 2 bilat -Leg press DL 106#2X15, then SL each side 50# 2X15 -Sidestepping with red band and monster walk with red band at counter top X 3 -Supine ankle DF and PF with green X20, EV with red 2X10  --Manual therapy for skilled  palpation and compression with Trigger Point Dry-Needling   Patient Consent Given: Yes Education handout provided: previously provided Muscles treated: left peroneal for muscle activation with Estim frequency 2 and intensity 3-4 to tolerance Treatment response/outcome: twitch response noted bilaterally         PATIENT EDUCATION:  Education details: exam findings and PT plan of care Person educated: Patient Education method: Explanation Education comprehension: verbalized understanding     HOME EXERCISE PROGRAM: Will review his HEP from last episode of care and revise PRN next session       ASSESSMENT:   CLINICAL IMPRESSION: :His gait is overall improving but still limited some by Lt foot drop/EV weakness and left hip weakness. We continue to work to slowly improve this with strengthening and neuromuscular facilitation.   OBJECTIVE IMPAIRMENTS: decreased activity tolerance, difficulty walking, decreased balance, decreased endurance, decreased mobility, decreased ROM, decreased strength, impaired flexibility, impaired LE use, postural dysfunction, and pain.   ACTIVITY LIMITATIONS: bending, lifting, carry, locomotion, cleaning, community activity, and or occupation   PERSONAL FACTORS: Lt peroneal nerve decompression, foot drop,2020 Colon Cancer, Neuropathy in feet, PVD, prostate cancer, hernia repair are also affecting patient's functional outcome.   REHAB POTENTIAL: Fair     CLINICAL DECISION MAKING: Stable/uncomplicated   EVALUATION COMPLEXITY: Low       GOALS: Short term PT Goals Target date: 11/21/2021 Pt will be I and compliant with HEP. Baseline:  Goal status:  MET Pt will decrease pain by 25% overall with prolonged standing Baseline: Goal status:MET   Long term PT goals Target date: 01/16/2022 Pt will improve left ankle ROM to WFL (10 deg EV and DF) in supine to improve functional mobility Baseline: Goal status: ongoing Pt will improve  hip/knee strength to at least 5-/5 MMT in sitting, and ankle EV strength to 4/5, ankle DF strength to 4+ to improve functional strength Baseline: Goal status: ongoing Pt will improve FOTO to at least 69% functional to show improved function Baseline: Goal status: ongoing Pt will reduce pain by overall 50% overall with usual activity and prolonged standing Baseline: Goal status: onging    PLAN: PT FREQUENCY: 1-3 times per week    PT DURATION: 6-8 weeks   PLANNED INTERVENTIONS (unless contraindicated): aquatic PT, Canalith repositioning, cryotherapy, Electrical stimulation, Iontophoresis with 4 mg/ml dexamethasome, Moist heat, traction, Ultrasound, gait training, Therapeutic exercise, balance training, neuromuscular re-education, patient/family education, prosthetic training, manual techniques, passive ROM, dry needling, taping, vasopnuematic device, vestibular, spinal manipulations, joint manipulations   PLAN FOR NEXT SESSION: DF and EV strength, balance, general leg strength, DN with stim and or [place] for peroneal recuitment    Debbe Odea, PT,DPT 12/26/2021, 10:03 AM

## 2021-12-29 ENCOUNTER — Encounter: Payer: Self-pay | Admitting: Physical Therapy

## 2021-12-29 ENCOUNTER — Ambulatory Visit (INDEPENDENT_AMBULATORY_CARE_PROVIDER_SITE_OTHER): Payer: Medicare Other | Admitting: Physical Therapy

## 2021-12-29 DIAGNOSIS — M25552 Pain in left hip: Secondary | ICD-10-CM

## 2021-12-29 DIAGNOSIS — M79662 Pain in left lower leg: Secondary | ICD-10-CM | POA: Diagnosis not present

## 2021-12-29 DIAGNOSIS — R262 Difficulty in walking, not elsewhere classified: Secondary | ICD-10-CM | POA: Diagnosis not present

## 2021-12-29 DIAGNOSIS — M6281 Muscle weakness (generalized): Secondary | ICD-10-CM | POA: Diagnosis not present

## 2021-12-29 NOTE — Therapy (Signed)
OUTPATIENT PHYSICAL THERAPY TREATMENT NOTE    Patient Name: Oscar Sparks MRN: [id number] DOB:[birth year], 81 y.o., male Today's Date: 12/29/2021   END OF SESSION:   PT End of Session - 12/29/21 0838     Visit Number 16    Number of Visits 20    Date for PT Re-Evaluation 01/16/22    Authorization Type MCR    Progress Note Due on Visit 16    PT Start Time 0840    PT Stop Time 0925    PT Time Calculation (min) 45 min    Activity Tolerance Patient tolerated treatment well    Behavior During Therapy [facility] for tasks assessed/performed             Past Medical History:  Diagnosis Date   Anemia    Arthritis    knees   Colon cancer ([place]) 09/24/2018   ED (erectile dysfunction)    Inguinal hernia    Neuromuscular disorder (HCC)    neuropathy in feet   Peripheral vascular disease ([place])    poor curculation in hands and feet hard to monitor with pulse oximetry   Prostate cancer ([place])    sees Dr. Gaynelle Arabian, received cesium seeds    Past Surgical History:  Procedure Laterality Date   BACK SURGERY     INGUINAL HERNIA REPAIR Right 05/20/2021   Procedure: OPEN RIGHT INGUINAL HERNIA REPAIR WITH MESH;  Surgeon: Jovita Kussmaul, MD;  Location: [place];  Service: General;  Laterality: Right;   SPINE SURGERY  2004   Dr. 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 Erlinda Hong    REFERRING DIAG: G57.32 (ICD-10-CM) - Peroneal neuropathy at knee, left    Rationale for Evaluation and Treatment Rehabilitation   ONSET DATE: 05/2020 S/P peroneal nerve decompression   SUBJECTIVE:    SUBJECTIVE STATEMENT: He relays he is about the same overall from last time, no better but no worse.   PERTINENT HISTORY: Lt peroneal nerve decompression, foot drop,2020 Colon Cancer, Neuropathy in feet, PVD, prostate cancer, hernia repair   PAIN:  Are you having pain? Yes: NPRS scale: 0/10 Pain location: left hip Pain description: inconvience Aggravating factors: prolonged standing or walking Relieving factors: rest, NSAIDS   PRECAUTIONS: Fall   WEIGHT BEARING RESTRICTIONS No   FALLS:  Has patient fallen in last 6 months? No, but has had several near falls   OCCUPATION: retired   PLOF: Independent with basic ADLs   PATIENT GOALS improve walking and balance by reducing foot drop     OBJECTIVE:    DIAGNOSTIC  FINDINGS: nothing recent   PATIENT SURVEYS:  FOTO 61% functional intake at eval, goal is 69%   COGNITION:           Overall cognitive status: Within functional limits for tasks assessed                              LOWER EXTREMITY ROM:   Active ROM Left eval Left 11/17/21 Left 12/06/21  Hip flexion      Hip extension      Hip abduction      Hip adduction      Hip internal rotation      Hip external rotation      Knee flexion      Knee extension      Ankle dorsiflexion '5 7 7  ' Ankle plantarflexion WNL    Ankle inversion WNL    Ankle eversion '5 10 10   ' (Blank rows = not tested)   LOWER EXTREMITY MMT:   MMT in sitting/supine Left eval  Left 12/06/21  Hip flexion 3+ 4  Hip extension     Hip abduction 3 4  Hip adduction     Hip internal rotation     Hip external rotation     Knee flexion     Knee extension     Ankle dorsiflexion 4 4+  Ankle plantarflexion 4 4+  Ankle inversion 5 5  Ankle eversion 2+ 3-   (Blank rows = not tested)   LOWER EXTREMITY SPECIAL TESTS:      FUNCTIONAL TESTS:  10/24/21: 5 times sit to stand: 11.74 seconds without UE support   GAIT: Distance walked: Buyer, retail device utilized: Single point cane Level of assistance: Modified independence Comments: foot drop noted on left with trendelenburg       TODAY'S TREATMENT: 12/29/21 -Recumbent bike L3 X 8 min -Slantboard 30 sec X 3 -Heel and toe raises X 20 bilat -Tandem balance 30 sec X 2 bilat -Leg press DL 112#2X15, then SL each side 50# 2X15 -Sidestepping with red band and monster walk with red band at counter top X 3 -Supine ankle DF and PF with green X15, EV with red X15  --Manual therapy for skilled palpation and compression with Trigger Point Dry-Needling   Patient Consent Given: Yes Education handout provided: previously provided Muscles treated: left peroneal for muscle activation with Estim frequency 2 and intensity 3-4 to tolerance Treatment response/outcome: twitch response noted bilaterally  12/26/21 -Nu step L 7 seat#12 X 10 min -Slantboard 30 sec X 3 -Heel and toe raises X 20 bilat -Tandem balance 30 sec X 2 bilat -Leg press DL 106#2X15, then SL each side 50# 2X15 -Sidestepping with red band and monster walk with red band at counter top X 3 -Supine ankle DF and PF with green X20, EV with red 2X10  -E-stim russian/NMES for motor recruitment of peroneals/Tibialis anterior 5/5 sec on/off with intensity to tolerance and active EV and DF contraction/AROM for 5 sec hold during on phase in sidelying X 10 total minutes  12/22/21 -Recumbent bike L3 X 8 min -Slantboard 30 sec X 3 -Heel and toe raises 2X15  bilat -Tandem balance 30 sec X 2 bilat -Sit to stands with UE support pushing up with hands on knees X10 -Sidestepping with red band and monster walk with red band at counter top X 3 -Supine ankle DF and PF with green X20, EV with red 2X10  --Manual therapy for skilled  palpation and compression with Trigger Point Dry-Needling   Patient Consent Given: Yes Education handout provided: previously provided Muscles treated: left peroneal for muscle activation with Estim frequency 2 and intensity 3-4 to tolerance Treatment response/outcome: twitch response noted bilaterally  12/20/21 -Recumbent bike L3 X 8 min -Slantboard 30 sec X 3 -Heel and toe raises 2X15 bilat -Tandem balance 30 sec X 2 bilat -Leg press DL 106#2X15, then SL each side 50# 2X15 -Sidestepping with red band and monster walk with red band at counter top X 3 -Supine ankle DF and PF with green X20, EV with red 2X10  --Manual therapy for skilled palpation and compression with Trigger Point Dry-Needling   Patient Consent Given: Yes Education handout provided: previously provided Muscles treated: left peroneal for muscle activation with Estim frequency 2 and intensity 3-4 to tolerance Treatment response/outcome: twitch response noted bilaterally         PATIENT EDUCATION:  Education details: exam findings and PT plan of care Person educated: Patient Education method: Explanation Education comprehension: verbalized understanding     HOME EXERCISE PROGRAM: Will review his HEP from last episode of care and revise PRN next session       ASSESSMENT:   CLINICAL IMPRESSION: :He had good twitch response from DN with Estim today for peroneal muscle neuro-muscular facilitation. Gait is overall slowly improving but he still has higher risk of falling due to foot drop and will need continued skilled PT to reduce his fall risk and functional gait.     OBJECTIVE IMPAIRMENTS: decreased activity tolerance, difficulty walking,  decreased balance, decreased endurance, decreased mobility, decreased ROM, decreased strength, impaired flexibility, impaired LE use, postural dysfunction, and pain.   ACTIVITY LIMITATIONS: bending, lifting, carry, locomotion, cleaning, community activity, and or occupation   PERSONAL FACTORS: Lt peroneal nerve decompression, foot drop,2020 Colon Cancer, Neuropathy in feet, PVD, prostate cancer, hernia repair are also affecting patient's functional outcome.   REHAB POTENTIAL: Fair     CLINICAL DECISION MAKING: Stable/uncomplicated   EVALUATION COMPLEXITY: Low       GOALS: Short term PT Goals Target date: 11/21/2021 Pt will be I and compliant with HEP. Baseline:  Goal status: MET Pt will decrease pain by 25% overall with prolonged standing Baseline: Goal status:MET   Long term PT goals Target date: 01/16/2022 Pt will improve left ankle ROM to WFL (10 deg EV and DF) in supine to improve functional mobility Baseline: Goal status: ongoing Pt will improve  hip/knee strength to at least 5-/5 MMT in sitting, and ankle EV strength to 4/5, ankle DF strength to 4+ to improve functional strength Baseline: Goal status: ongoing Pt will improve FOTO to at least 69% functional to show improved function Baseline: Goal status: ongoing Pt will reduce pain by overall 50% overall with usual activity and prolonged standing Baseline: Goal status: onging    PLAN: PT FREQUENCY: 1-3 times per week    PT DURATION: 6-8 weeks   PLANNED INTERVENTIONS (unless contraindicated): aquatic PT, Canalith repositioning, cryotherapy, Electrical stimulation, Iontophoresis with 4 mg/ml dexamethasome, Moist heat, traction, Ultrasound, gait training, Therapeutic exercise, balance training, neuromuscular re-education, patient/family education, prosthetic training, manual techniques, passive ROM, dry needling, taping, vasopnuematic device, vestibular, spinal manipulations, joint manipulations   PLAN FOR NEXT SESSION:  DF and EV strength, balance, general leg strength, DN with stim and or [place] for peroneal recuitment    Debbe Odea, PT,DPT 12/29/2021, 8:39 AM

## 2022-01-03 ENCOUNTER — Encounter: Payer: Medicare Other | Admitting: Physical Therapy

## 2022-01-05 ENCOUNTER — Encounter: Payer: Self-pay | Admitting: Physical Therapy

## 2022-01-05 ENCOUNTER — Ambulatory Visit (INDEPENDENT_AMBULATORY_CARE_PROVIDER_SITE_OTHER): Payer: Medicare Other | Admitting: Physical Therapy

## 2022-01-05 DIAGNOSIS — R262 Difficulty in walking, not elsewhere classified: Secondary | ICD-10-CM | POA: Diagnosis not present

## 2022-01-05 DIAGNOSIS — M79662 Pain in left lower leg: Secondary | ICD-10-CM | POA: Diagnosis not present

## 2022-01-05 DIAGNOSIS — M25552 Pain in left hip: Secondary | ICD-10-CM | POA: Diagnosis not present

## 2022-01-05 DIAGNOSIS — M6281 Muscle weakness (generalized): Secondary | ICD-10-CM

## 2022-01-05 NOTE — Therapy (Signed)
OUTPATIENT PHYSICAL THERAPY TREATMENT NOTE    Patient Name: Oscar Sparks MRN: [id number] DOB:1940-10-14, 81 y.o., male Today's Date: 01/05/2022   END OF SESSION:   PT End of Session - 01/05/22 0805     Visit Number 17    Number of Visits 20    Date for PT Re-Evaluation 01/16/22    Authorization Type MCR    Progress Note Due on Visit 20    PT Start Time 0800    PT Stop Time 0845    PT Time Calculation (min) 45 min    Activity Tolerance Patient tolerated treatment well    Behavior During Therapy [facility] for tasks assessed/performed             Past Medical History:  Diagnosis Date   Anemia    Arthritis    knees   Colon cancer ([place]) 09/24/2018   ED (erectile dysfunction)    Inguinal hernia    Neuromuscular 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 Gaynelle Arabian, received cesium seeds    Past Surgical History:  Procedure Laterality Date   BACK SURGERY     INGUINAL HERNIA REPAIR Right 05/20/2021   Procedure: OPEN RIGHT INGUINAL HERNIA REPAIR WITH MESH;  Surgeon: Jovita Kussmaul, MD;  Location: [place];  Service: General;  Laterality: Right;   SPINE SURGERY  2004   Dr. 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    PERTINENT HISTORY: Lt peroneal nerve decompression, foot drop,2020 Colon Cancer, Neuropathy in feet, PVD, prostate cancer, hernia repair   PAIN:  Are you having pain? Yes: NPRS scale: 0/10 Pain location: left hip Pain description: inconvience Aggravating factors: prolonged standing or walking Relieving factors: rest, NSAIDS   PRECAUTIONS: Fall   WEIGHT BEARING RESTRICTIONS No   FALLS:  Has patient fallen in last 6 months? No, but has had several near falls   OCCUPATION: retired   PLOF: Independent with basic ADLs   PATIENT GOALS improve walking and balance by reducing foot drop  OBJECTIVE:    DIAGNOSTIC FINDINGS: nothing recent   PATIENT SURVEYS:  FOTO 61% functional intake at eval, goal is 69%   COGNITION:           Overall cognitive status: Within functional limits for tasks assessed                              LOWER EXTREMITY ROM:   Active ROM Left eval Left 11/17/21 Left 12/06/21  Hip flexion      Hip extension      Hip abduction      Hip adduction      Hip internal rotation      Hip external rotation      Knee flexion      Knee extension      Ankle dorsiflexion '5 7 7  ' Ankle plantarflexion WNL    Ankle inversion WNL    Ankle eversion '5 10 10   ' (Blank rows = not tested)   LOWER  EXTREMITY MMT:   MMT in sitting/supine Left eval Left 12/06/21  Hip flexion 3+ 4  Hip extension     Hip abduction 3 4  Hip adduction     Hip internal rotation     Hip external rotation     Knee flexion     Knee extension     Ankle dorsiflexion 4 4+  Ankle plantarflexion 4 4+  Ankle inversion 5 5  Ankle eversion 2+ 3-   (Blank rows = not tested)   LOWER EXTREMITY SPECIAL TESTS:      FUNCTIONAL TESTS:  10/24/21: 5 times sit to stand: 11.74 seconds without UE support   GAIT: Distance walked: Buyer, retail device utilized: Single point cane Level of assistance: Modified independence Comments: foot drop noted on left with trendelenburg       TODAY'S TREATMENT: 01/05/22 -Recumbent bike L3 X 8 min -Chair squats 20#  X10, then 10# X10 -Slantboard 30 sec X 3 -Heel and toe raises 2 X 20 bilat -Tandem balance 30 sec X 2 bilat -Sidestepping with red band and monster walk with red band at counter top X 4 -Supine ankle DF and PF and EV with red X20 each  --E-stim russian/NMES for motor recruitment of peroneals/Tibialis anterior 5/5 sec on/off with intensity to tolerance and active EV and DF contraction/AROM for 5 sec hold during on phase in sidelying X 10 total minutes  -Education on toe up brace to assist with drop foot.   12/29/21 -Recumbent bike L3 X 8 min -Slantboard 30 sec X 3 -Heel and toe raises X 20 bilat -Tandem balance 30 sec X 2 bilat -Leg press DL 112#2X15, then SL each side 50# 2X15 -Sidestepping with red band and monster walk with red band at counter top X 3 -Supine ankle DF and PF with green X15, EV with red X15  --Manual therapy for skilled palpation and compression with Trigger Point Dry-Needling   Patient Consent Given: Yes Education handout provided: previously provided Muscles treated: left peroneal for muscle activation with Estim frequency 2 and intensity 3-4 to tolerance Treatment response/outcome: twitch response noted  bilaterally  12/26/21 -Nu step L 7 seat#12 X 10 min -Slantboard 30 sec X 3 -Heel and toe raises X 20 bilat -Tandem balance 30 sec X 2 bilat -Leg press DL 106#2X15, then SL each side 50# 2X15 -Sidestepping with red band and monster walk with red band at counter top X 3 -Supine ankle DF and  PF with green X20, EV with red 2X10  -E-stim russian/NMES for motor recruitment of peroneals/Tibialis anterior 5/5 sec on/off with intensity to tolerance and active EV and DF contraction/AROM for 5 sec hold during on phase in sidelying X 10 total minutes  12/22/21 -Recumbent bike L3 X 8 min -Slantboard 30 sec X 3 -Heel and toe raises 2X15 bilat -Tandem balance 30 sec X 2 bilat -Sit to stands with UE support pushing up with hands on knees X10 -Sidestepping with red band and monster walk with red band at counter top X 3 -Supine ankle DF and PF with green X20, EV with red 2X10  --Manual therapy for skilled palpation and compression with Trigger Point Dry-Needling   Patient Consent Given: Yes Education handout provided: previously provided Muscles treated: left peroneal for muscle activation with Estim frequency 2 and intensity 3-4 to tolerance Treatment response/outcome: twitch response noted bilaterally  12/20/21 -Recumbent bike L3 X 8 min -Slantboard 30 sec X 3 -Heel and toe raises 2X15 bilat -Tandem balance 30 sec X 2 bilat -Leg press DL 106#2X15, then SL each side 50# 2X15 -Sidestepping with red band and monster walk with red band at counter top X 3 -Supine ankle DF and PF with green X20, EV with red 2X10  --Manual therapy for skilled palpation and compression with Trigger Point Dry-Needling   Patient Consent Given: Yes Education handout provided: previously provided Muscles treated: left peroneal for muscle activation with Estim frequency 2 and intensity 3-4 to tolerance Treatment response/outcome: twitch response noted bilaterally         PATIENT EDUCATION:  Education details: exam  findings and PT plan of care Person educated: Patient Education method: Explanation Education comprehension: verbalized understanding     HOME EXERCISE PROGRAM: Will review his HEP from last episode of care and revise PRN next session       ASSESSMENT:   CLINICAL IMPRESSION: : He appeared to have a little more movement in left ankle EV but this gets fatigued easy and creates drop foot with walking. I show him the toe up brace which may be an option for him that he might like better than the AFO he has.   OBJECTIVE IMPAIRMENTS: decreased activity tolerance, difficulty walking, decreased balance, decreased endurance, decreased mobility, decreased ROM, decreased strength, impaired flexibility, impaired LE use, postural dysfunction, and pain.   ACTIVITY LIMITATIONS: bending, lifting, carry, locomotion, cleaning, community activity, and or occupation   PERSONAL FACTORS: Lt peroneal nerve decompression, foot drop,2020 Colon Cancer, Neuropathy in feet, PVD, prostate cancer, hernia repair are also affecting patient's functional outcome.   REHAB POTENTIAL: Fair     CLINICAL DECISION MAKING: Stable/uncomplicated   EVALUATION COMPLEXITY: Low       GOALS: Short term PT Goals Target date: 11/21/2021 Pt will be I and compliant with HEP. Baseline:  Goal status: MET Pt will decrease pain by 25% overall with prolonged standing Baseline: Goal status:MET   Long term PT goals Target date: 01/16/2022 Pt will improve left ankle ROM to WFL (10 deg EV and DF) in supine to improve functional mobility Baseline: Goal status: ongoing Pt will improve  hip/knee strength to at least 5-/5 MMT in sitting, and ankle EV strength to 4/5, ankle DF strength to 4+ to improve functional strength Baseline: Goal status: ongoing Pt will improve FOTO to at least 69% functional to show improved function Baseline: Goal status: ongoing Pt will reduce pain by overall 50% overall with usual activity and prolonged  standing Baseline: Goal status: onging  PLAN: PT FREQUENCY: 1-3 times per week    PT DURATION: 6-8 weeks   PLANNED INTERVENTIONS (unless contraindicated): aquatic PT, Canalith repositioning, cryotherapy, Electrical stimulation, Iontophoresis with 4 mg/ml dexamethasome, Moist heat, traction, Ultrasound, gait training, Therapeutic exercise, balance training, neuromuscular re-education, patient/family education, prosthetic training, manual techniques, passive ROM, dry needling, taping, vasopnuematic device, vestibular, spinal manipulations, joint manipulations   PLAN FOR NEXT SESSION: DF and EV strength, balance, general leg strength, DN with stim and or [place] for peroneal recuitment    Debbe Odea, PT,DPT 01/05/2022, 8:06 AM

## 2022-01-10 ENCOUNTER — Encounter: Payer: Self-pay | Admitting: Physical Therapy

## 2022-01-10 ENCOUNTER — Ambulatory Visit (INDEPENDENT_AMBULATORY_CARE_PROVIDER_SITE_OTHER): Payer: Medicare Other | Admitting: Physical Therapy

## 2022-01-10 DIAGNOSIS — M25552 Pain in left hip: Secondary | ICD-10-CM | POA: Diagnosis not present

## 2022-01-10 DIAGNOSIS — R262 Difficulty in walking, not elsewhere classified: Secondary | ICD-10-CM | POA: Diagnosis not present

## 2022-01-10 DIAGNOSIS — M6281 Muscle weakness (generalized): Secondary | ICD-10-CM | POA: Diagnosis not present

## 2022-01-10 DIAGNOSIS — M79662 Pain in left lower leg: Secondary | ICD-10-CM | POA: Diagnosis not present

## 2022-01-10 NOTE — Therapy (Signed)
OUTPATIENT PHYSICAL THERAPY TREATMENT NOTE    Patient Name: Oscar Sparks MRN: [id number] DOB:02/25/1941, 81 y.o., male Today's Date: 01/10/2022   END OF SESSION:   PT End of Session - 01/10/22 0800     Visit Number 18    Number of Visits 20    Date for PT Re-Evaluation 01/16/22    Authorization Type MCR    Progress Note Due on Visit 20    PT Start Time 0800    PT Stop Time 0845    PT Time Calculation (min) 45 min    Activity Tolerance Patient tolerated treatment well    Behavior During Therapy [facility] for tasks assessed/performed             Past Medical History:  Diagnosis Date   Anemia    Arthritis    knees   Colon cancer ([place]) 09/24/2018   ED (erectile dysfunction)    Inguinal hernia    Neuromuscular disorder (HCC)    neuropathy in feet   Peripheral vascular disease ([place])    poor curculation in hands and feet hard to monitor with pulse oximetry   Prostate cancer ([place])    sees Dr. Gaynelle Arabian, received cesium seeds    Past Surgical History:  Procedure Laterality Date   BACK SURGERY     INGUINAL HERNIA REPAIR Right 05/20/2021   Procedure: OPEN RIGHT INGUINAL HERNIA REPAIR WITH MESH;  Surgeon: Jovita Kussmaul, MD;  Location: [place];  Service: General;  Laterality: Right;   SPINE SURGERY  2004   Dr. 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 Yes: NPRS scale: 0/10 Pain location: left hip Pain description: inconvience Aggravating factors: prolonged standing or walking Relieving factors: rest, NSAIDS   PRECAUTIONS: Fall   WEIGHT BEARING RESTRICTIONS No   FALLS:  Has patient fallen in last 6 months? No, but has had several near falls   OCCUPATION: retired   PLOF: Independent with basic ADLs   PATIENT GOALS improve walking and balance by reducing foot drop     OBJECTIVE:    DIAGNOSTIC FINDINGS: nothing recent   PATIENT SURVEYS:  FOTO  61% functional intake at eval, goal is 69%   COGNITION:           Overall cognitive status: Within functional limits for tasks assessed                              LOWER EXTREMITY ROM:   Active ROM Left eval Left 11/17/21 Left 12/06/21 Left 01/10/22  Hip flexion       Hip extension       Hip abduction       Hip adduction       Hip internal rotation       Hip external rotation       Knee flexion       Knee extension       Ankle dorsiflexion '5 7 7 7  ' Ankle plantarflexion WNL     Ankle inversion WNL     Ankle eversion '5 10 10 10   ' (Blank rows = not tested)   LOWER EXTREMITY MMT:   MMT in sitting/supine Left eval  Left 12/06/21 Left 01/10/22  Hip flexion 3+ 4   Hip extension      Hip abduction 3 4   Hip adduction      Hip internal rotation      Hip external rotation      Knee flexion      Knee extension      Ankle dorsiflexion 4 4+   Ankle plantarflexion 4 4+   Ankle inversion 5 5   Ankle eversion 2+ 3- 3   (Blank rows = not tested)   LOWER EXTREMITY SPECIAL TESTS:      FUNCTIONAL TESTS:  10/24/21: 5 times sit to stand: 11.74 seconds without UE support   GAIT: Distance walked: Buyer, retail device utilized: Single point cane Level of assistance: Modified independence Comments: foot drop noted on left with trendelenburg       TODAY'S TREATMENT: 01/10/22 -Recumbent bike L3 X 8 min -Chair squats 20#  X10, then 10# X10 -Slantboard 30 sec X 3 -Heel and toe raises 2 X 20 bilat -Tandem balance 30 sec X 2 bilat -Sidestepping with green band and monster walk with red band at counter top X 4 -Supine ankle DF and PF and EV with green X15each --Manual therapy for skilled palpation and compression with Trigger Point Dry-Needling   Patient Consent Given: Yes Education handout provided: previously provided Muscles treated: left peroneal for muscle activation with Estim frequency 2 and intensity 3-4 to tolerance Treatment response/outcome: twitch response noted bilaterally   01/05/22 -Recumbent bike L3 X 8 min -Chair squats 20#  X10, then 10# X10 -Slantboard 30 sec X 3 -Heel and toe raises 2 X 20 bilat -Tandem balance 30 sec X 2 bilat -Sidestepping with red band and monster walk with red band at counter top X 4 -Supine ankle DF and PF and EV with red X20 each  --E-stim russian/NMES for motor recruitment of peroneals/Tibialis anterior 5/5 sec on/off with intensity to tolerance and active EV and DF contraction/AROM for 5 sec hold during on phase in sidelying X 10 total minutes  -Education on toe up brace to assist with drop foot.   12/29/21 -Recumbent bike L3 X 8  min -Slantboard 30 sec X 3 -Heel and toe raises X 20 bilat -Tandem balance 30 sec X 2 bilat -Leg press DL 112#2X15, then SL each side 50# 2X15 -Sidestepping with red band and monster walk with red band  at counter top X 3 -Supine ankle DF and PF with green X15, EV with red X15  --Manual therapy for skilled palpation and compression with Trigger Point Dry-Needling   Patient Consent Given: Yes Education handout provided: previously provided Muscles treated: left peroneal for muscle activation with Estim frequency 2 and intensity 3-4 to tolerance Treatment response/outcome: twitch response noted bilaterally  12/26/21 -Nu step L 7 seat#12 X 10 min -Slantboard 30 sec X 3 -Heel and toe raises X 20 bilat -Tandem balance 30 sec X 2 bilat -Leg press DL 106#2X15, then SL each side 50# 2X15 -Sidestepping with red band and monster walk with red band at counter top X 3 -Supine ankle DF and PF with green X20, EV with red 2X10  -E-stim russian/NMES for motor recruitment of peroneals/Tibialis anterior 5/5 sec on/off with intensity to tolerance and active EV and DF contraction/AROM for 5 sec hold during on phase in sidelying X 10 total minutes  12/22/21 -Recumbent bike L3 X 8 min -Slantboard 30 sec X 3 -Heel and toe raises 2X15 bilat -Tandem balance 30 sec X 2 bilat -Sit to stands with UE support pushing up with hands on knees X10 -Sidestepping with red band and monster walk with red band at counter top X 3 -Supine ankle DF and PF with green X20, EV with red 2X10  --Manual therapy for skilled palpation and compression with Trigger Point Dry-Needling   Patient Consent Given: Yes Education handout provided: previously provided Muscles treated: left peroneal for muscle activation with Estim frequency 2 and intensity 3-4 to tolerance Treatment response/outcome: twitch response noted bilaterally  12/20/21 -Recumbent bike L3 X 8 min -Slantboard 30 sec X 3 -Heel and toe raises 2X15 bilat -Tandem  balance 30 sec X 2 bilat -Leg press DL 106#2X15, then SL each side 50# 2X15 -Sidestepping with red band and monster walk with red band at counter top X 3 -Supine ankle DF and PF with green X20, EV with red 2X10  --Manual therapy for skilled palpation and compression with Trigger Point Dry-Needling   Patient Consent Given: Yes Education handout provided: previously provided Muscles treated: left peroneal for muscle activation with Estim frequency 2 and intensity 3-4 to tolerance Treatment response/outcome: twitch response noted bilaterally         PATIENT EDUCATION:  Education details: exam findings and PT plan of care Person educated: Patient Education method: Explanation Education comprehension: verbalized understanding     HOME EXERCISE PROGRAM: Will review his HEP from last episode of care and revise PRN next session       ASSESSMENT:   CLINICAL IMPRESSION: : EV strength has now improved to 3/5. We will continue to work to maximize this as much as possible to improve his functions.   OBJECTIVE IMPAIRMENTS: decreased activity tolerance, difficulty walking, decreased balance, decreased endurance, decreased mobility, decreased ROM, decreased strength, impaired flexibility, impaired LE use, postural dysfunction, and pain.   ACTIVITY LIMITATIONS: bending, lifting, carry, locomotion, cleaning, community activity, and or occupation   PERSONAL FACTORS: Lt peroneal nerve decompression, foot drop,2020 Colon Cancer, Neuropathy in feet, PVD, prostate cancer, hernia repair are also affecting patient's functional outcome.   REHAB POTENTIAL: Fair     CLINICAL DECISION MAKING: Stable/uncomplicated   EVALUATION COMPLEXITY: Low       GOALS: Short term PT Goals Target date: 11/21/2021 Pt will be I and compliant with HEP. Baseline:  Goal status: MET Pt will decrease pain by 25% overall with prolonged standing Baseline: Goal status:MET   Long term PT  goals Target date:  01/16/2022 Pt will improve left ankle ROM to WFL (10 deg EV and DF) in supine to improve functional mobility Baseline: Goal status: ongoing Pt will improve  hip/knee strength to at least 5-/5 MMT in sitting, and ankle EV strength to 4/5, ankle DF strength to 4+ to improve functional strength Baseline: Goal status: ongoing Pt will improve FOTO to at least 69% functional to show improved function Baseline: Goal status: ongoing Pt will reduce pain by overall 50% overall with usual activity and prolonged standing Baseline: Goal status: onging    PLAN: PT FREQUENCY: 1-3 times per week    PT DURATION: 6-8 weeks   PLANNED INTERVENTIONS (unless contraindicated): aquatic PT, Canalith repositioning, cryotherapy, Electrical stimulation, Iontophoresis with 4 mg/ml dexamethasome, Moist heat, traction, Ultrasound, gait training, Therapeutic exercise, balance training, neuromuscular re-education, patient/family education, prosthetic training, manual techniques, passive ROM, dry needling, taping, vasopnuematic device, vestibular, spinal manipulations, joint manipulations   PLAN FOR NEXT SESSION: DF and EV strength, balance, general leg strength, DN with stim and or [place] for peroneal recuitment    Debbe Odea, PT,DPT 01/10/2022, 8:01 AM

## 2022-01-12 ENCOUNTER — Encounter: Payer: Medicare Other | Admitting: Physical Therapy

## 2022-01-17 ENCOUNTER — Encounter: Payer: Medicare Other | Admitting: Physical Therapy

## 2022-01-19 ENCOUNTER — Encounter: Payer: Medicare Other | Admitting: Physical Therapy

## 2022-01-23 ENCOUNTER — Ambulatory Visit (INDEPENDENT_AMBULATORY_CARE_PROVIDER_SITE_OTHER): Payer: Medicare Other | Admitting: Physical Therapy

## 2022-01-23 ENCOUNTER — Encounter: Payer: Self-pay | Admitting: Physical Therapy

## 2022-01-23 DIAGNOSIS — R262 Difficulty in walking, not elsewhere classified: Secondary | ICD-10-CM | POA: Diagnosis not present

## 2022-01-23 DIAGNOSIS — M79662 Pain in left lower leg: Secondary | ICD-10-CM

## 2022-01-23 DIAGNOSIS — M6281 Muscle weakness (generalized): Secondary | ICD-10-CM | POA: Diagnosis not present

## 2022-01-23 DIAGNOSIS — M25552 Pain in left hip: Secondary | ICD-10-CM | POA: Diagnosis not present

## 2022-01-23 NOTE — Therapy (Signed)
OUTPATIENT PHYSICAL THERAPY TREATMENT NOTE Progress Note reporting period 12/06/21 to 01/23/22  See below for objective and subjective measurements relating to patients progress with PT.     Patient Name: Oscar Sparks MRN: [id number] DOB:[birth year], 81 y.o., male Today's Date: 01/23/2022   END OF SESSION:   PT End of Session - 01/23/22 0814     Visit Number 19    Number of Visits 30    Date for PT Re-Evaluation 03/27/22    Authorization Type MCR    Progress Note Due on Visit 34    PT Start Time 0800    PT Stop Time 0845    PT Time Calculation (min) 45 min    Activity Tolerance Patient tolerated treatment well    Behavior During Therapy [facility] for tasks assessed/performed             Past Medical History:  Diagnosis Date   Anemia    Arthritis    knees   Colon cancer ([place]) 09/24/2018   ED (erectile dysfunction)    Inguinal hernia    Neuromuscular disorder (HCC)    neuropathy in feet   Peripheral vascular disease ([place])    poor curculation in hands and feet hard to monitor with pulse oximetry   Prostate cancer ([place])    sees Dr. Gaynelle Arabian, received cesium seeds    Past Surgical History:  Procedure Laterality Date   BACK SURGERY     INGUINAL HERNIA REPAIR Right 05/20/2021   Procedure: OPEN RIGHT INGUINAL HERNIA REPAIR WITH MESH;  Surgeon: Jovita Kussmaul, MD;  Location: [place];  Service: General;  Laterality: Right;   SPINE SURGERY  2004   Dr. 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 He feels his left ankle has improved with PT to about 70% of his normal.    PERTINENT HISTORY: Lt peroneal nerve decompression, foot drop,2020 Colon Cancer, Neuropathy in feet, PVD, prostate cancer, hernia repair   PAIN:  Are you having pain? Yes: NPRS scale: 0/10 Pain location: left hip Pain description: inconvience Aggravating factors: prolonged standing or walking Relieving factors: rest, NSAIDS   PRECAUTIONS: Fall   WEIGHT BEARING RESTRICTIONS No   FALLS:  Has patient fallen in last 6 months? No, but has  had several near falls   OCCUPATION: retired   PLOF: Independent with basic ADLs   PATIENT GOALS improve walking and balance by reducing foot drop     OBJECTIVE:    DIAGNOSTIC FINDINGS: nothing recent   PATIENT SURVEYS:  FOTO 61% functional intake at eval, goal is 69%   COGNITION:           Overall cognitive status: Within functional limits for tasks assessed                              LOWER EXTREMITY ROM:   Active ROM Left eval Left 11/17/21 Left 12/06/21 Left 01/10/22 Left 01/23/22  Hip flexion        Hip extension        Hip abduction        Hip adduction        Hip internal rotation        Hip external  rotation        Knee flexion        Knee extension        Ankle dorsiflexion _0 Ankle plantarflexion WNL      Ankle inversion WNL      Ankle eversion _1 (Blank rows = not tested)   LOWER EXTREMITY MMT:   MMT in sitting/supine Left eval Left 12/06/21 Left 01/10/22 Left 01/23/22  Hip flexion 3+ 4    Hip extension       Hip abduction 3 4    Hip adduction       Hip internal rotation       Hip external rotation       Knee flexion       Knee extension       Ankle dorsiflexion 4 4+    Ankle plantarflexion 4 4+    Ankle inversion 5 5    Ankle eversion 2+ 3- 3 3+   (Blank rows = not tested)   LOWER EXTREMITY SPECIAL TESTS:      FUNCTIONAL TESTS:  10/24/21: 5 times sit to stand: 11.74 seconds without UE support   GAIT: Distance walked: Buyer, retail device utilized: Single point cane Level of assistance: Modified independence Comments: foot drop noted on left with trendelenburg       TODAY'S TREATMENT: 01/23/22 -Recumbent bike L3 X 8 min -Slantboard 30 sec X 3 -Heel and toe raises 2X15 bilat -Tandem balance 30 sec X 2 bilat -Leg press DL 106#2X15, then SL each side 50# 2X15 -Sidestepping with red band and monster walk with green band at counter top X 3 -Supine ankle DF and PF with green X20, EV with green X15  --Manual therapy for skilled palpation and compression with Trigger Point Dry-Needling   Patient Consent Given: Yes Education handout provided: previously provided Muscles treated: left peroneal for muscle activation with Estim frequency 2 and intensity 3-4 to tolerance Treatment response/outcome: twitch response noted bilaterally  01/10/22 -Recumbent bike L3 X 8 min -Chair squats 20#  X10, then 10# X10 -Slantboard 30 sec X 3 -Heel and toe raises 2 X 20 bilat -Tandem balance 30 sec X 2 bilat -Sidestepping with green band and monster walk with red band at counter top X 4 -Supine ankle DF and PF and EV with green  X15each --Manual therapy for skilled palpation and compression with Trigger Point Dry-Needling   Patient Consent Given: Yes Education handout provided:  previously provided Muscles treated: left peroneal for muscle activation with Estim frequency 2 and intensity 3-4 to tolerance Treatment response/outcome: twitch response noted bilaterally   01/05/22 -Recumbent bike L3 X 8 min -Chair squats 20#  X10, then 10# X10 -Slantboard 30 sec X 3 -Heel and toe raises 2 X 20 bilat -Tandem balance 30 sec X 2 bilat -Sidestepping with red band and monster walk with red band at counter top X 4 -Supine ankle DF and PF and EV with red X20 each  --E-stim russian/NMES for motor recruitment of peroneals/Tibialis anterior 5/5 sec on/off with intensity to tolerance and active EV and DF contraction/AROM for 5 sec hold during on phase in sidelying X 10 total minutes  -Education on toe up brace to assist with drop foot.      PATIENT EDUCATION:  Education details: exam findings and PT plan of care Person educated: Patient Education method: Explanation Education comprehension: verbalized understanding     HOME EXERCISE PROGRAM: Will review his HEP from last episode of care and revise PRN next session       ASSESSMENT:   CLINICAL IMPRESSION: : Progress note reflects overall progress with PT in left ankle DF and EV ROM and strength post op. Progress is overall slow but due to him improving both subjectively and objectively more PT is recommended as he has not yet met his functional goals.   OBJECTIVE IMPAIRMENTS: decreased activity tolerance, difficulty walking, decreased balance, decreased endurance, decreased mobility, decreased ROM, decreased strength, impaired flexibility, impaired LE use, postural dysfunction, and pain.   ACTIVITY LIMITATIONS: bending, lifting, carry, locomotion, cleaning, community activity, and or occupation   PERSONAL FACTORS: Lt peroneal nerve decompression, foot drop,2020 Colon  Cancer, Neuropathy in feet, PVD, prostate cancer, hernia repair are also affecting patient's functional outcome.   REHAB POTENTIAL: Fair     CLINICAL DECISION MAKING: Stable/uncomplicated   EVALUATION COMPLEXITY: Low       GOALS: Short term PT Goals Target date: 11/21/2021 Pt will be I and compliant with HEP. Baseline:  Goal status: MET Pt will decrease pain by 25% overall with prolonged standing Baseline: Goal status:MET   Long term PT goals Target date: 03/27/2022 Pt will improve left ankle ROM to WFL (10 deg EV and DF) in supine to improve functional mobility Baseline: Goal status: ongong 01/23/22 Pt will improve  hip/knee strength to at least 5-/5 MMT in sitting, and ankle EV strength to 4/5, ankle DF strength to 4+ to improve functional strength Baseline: Goal status: ongoing Pt will improve FOTO to at least 69% functional to show improved function Baseline: Goal status: Discontinued, incorrect body part/set up Pt will reduce pain by overall 50% overall with usual activity and prolonged standing Baseline: Goal status: MET 01/23/33    PLAN: PT FREQUENCY: 1-3 times per week    PT DURATION: 6-8 weeks   PLANNED INTERVENTIONS (unless contraindicated): aquatic PT, Canalith repositioning, cryotherapy, Electrical stimulation, Iontophoresis with 4 mg/ml dexamethasome, Moist heat, traction, Ultrasound, gait training, Therapeutic exercise, balance training, neuromuscular re-education, patient/family education, prosthetic training, manual techniques, passive ROM, dry needling, taping, vasopnuematic device, vestibular, spinal manipulations, joint manipulations   PLAN FOR NEXT SESSION: DF and EV strength, balance, general leg strength, DN with stim and or [place] for peroneal recuitment    Debbe Odea, PT,DPT 01/23/2022, 8:45 AM

## 2022-01-26 ENCOUNTER — Ambulatory Visit (INDEPENDENT_AMBULATORY_CARE_PROVIDER_SITE_OTHER): Payer: Medicare Other | Admitting: Physical Therapy

## 2022-01-26 DIAGNOSIS — M79662 Pain in left lower leg: Secondary | ICD-10-CM

## 2022-01-26 DIAGNOSIS — M25552 Pain in left hip: Secondary | ICD-10-CM | POA: Diagnosis not present

## 2022-01-26 DIAGNOSIS — M6281 Muscle weakness (generalized): Secondary | ICD-10-CM

## 2022-01-26 DIAGNOSIS — R262 Difficulty in walking, not elsewhere classified: Secondary | ICD-10-CM

## 2022-01-26 NOTE — Therapy (Signed)
OUTPATIENT PHYSICAL THERAPY TREATMENT NOTE     Patient Name: Oscar Sparks MRN: [id number] DOB:[birth year], 81 y.o., male Today's Date: 01/26/2022   END OF SESSION:   PT End of Session - 01/26/22 0272     Visit Number 20    Number of Visits 30    Date for PT Re-Evaluation 03/27/22    Authorization Type MCR    Progress Note Due on Visit 45    PT Start Time 0800    PT Stop Time 0845    PT Time Calculation (min) 45 min    Activity Tolerance Patient tolerated treatment well    Behavior During Therapy [facility] for tasks assessed/performed             Past Medical History:  Diagnosis Date   Anemia    Arthritis    knees   Colon cancer ([place]) 09/24/2018   ED (erectile dysfunction)    Inguinal hernia    Neuromuscular disorder (HCC)    neuropathy in feet   Peripheral vascular disease ([place])    poor curculation in hands and feet hard to monitor with pulse oximetry   Prostate cancer ([place])    sees Dr. Gaynelle Arabian, received cesium seeds    Past Surgical History:  Procedure Laterality Date   BACK SURGERY     INGUINAL HERNIA REPAIR Right 05/20/2021   Procedure: OPEN RIGHT INGUINAL HERNIA REPAIR WITH MESH;  Surgeon: Jovita Kussmaul, MD;  Location: [place];  Service: General;  Laterality: Right;   SPINE SURGERY  2004   Dr. 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 Yes: NPRS scale: 0/10 Pain location: left hip Pain description: inconvience Aggravating factors: prolonged standing or walking Relieving factors: rest, NSAIDS   PRECAUTIONS: Fall   WEIGHT BEARING RESTRICTIONS No   FALLS:  Has patient fallen in last 6 months? No, but has had several near falls   OCCUPATION: retired   PLOF: Independent with basic ADLs   PATIENT GOALS improve walking and balance by reducing foot drop     OBJECTIVE:    DIAGNOSTIC FINDINGS: nothing recent   PATIENT SURVEYS:  FOTO  61% functional intake at eval, goal is 69%   COGNITION:           Overall cognitive status: Within functional limits for tasks assessed                              LOWER EXTREMITY ROM:   Active ROM Left eval Left 11/17/21 Left 12/06/21 Left 01/10/22 Left 01/23/22  Hip flexion        Hip extension        Hip abduction        Hip adduction        Hip internal rotation        Hip external rotation        Knee flexion        Knee extension        Ankle dorsiflexion _0 Ankle plantarflexion WNL      Ankle inversion WNL      Ankle eversion _1 (Blank rows = not tested)   LOWER EXTREMITY MMT:   MMT in  sitting/supine Left eval Left 12/06/21 Left 01/10/22 Left 01/23/22  Hip flexion 3+ 4    Hip extension       Hip abduction 3 4    Hip adduction       Hip internal rotation       Hip external rotation       Knee flexion       Knee extension       Ankle dorsiflexion 4 4+    Ankle plantarflexion 4 4+    Ankle inversion 5 5    Ankle eversion 2+ 3- 3 3+   (Blank rows = not tested)   LOWER EXTREMITY SPECIAL TESTS:      FUNCTIONAL TESTS:  10/24/21: 5 times sit to stand: 11.74 seconds without UE support   GAIT: Distance walked: Information systems manager utilized: Single point cane Level of assistance: Modified independence Comments: foot drop noted on left with trendelenburg       TODAY'S TREATMENT: 01/26/22 -Nu step L6 X 9 min seat 12 -Slantboard 30 sec X 3 -Heel and toe raises 2X15 bilat -Tandem balance 30 sec X 2 bilat -Leg press DL 106#2X15, then SL each side 56# 2X15 -Sidestepping with green band and monster walk with green band at counter top X 3 -Supine ankle DF and PF with green X20, EV with green X15  --Manual therapy for skilled palpation and compression with Trigger Point Dry-Needling   Patient Consent Given: Yes Education handout provided: previously provided Muscles treated: left peroneal for muscle activation with Estim frequency 2 and intensity 3-4 to tolerance Treatment response/outcome: twitch response noted bilaterally     PATIENT EDUCATION:  Education details: exam findings and PT plan of care Person educated: Patient Education method: Explanation Education comprehension: verbalized understanding     HOME EXERCISE PROGRAM: Will review his HEP from last episode of care and revise PRN next session       ASSESSMENT:   CLINICAL IMPRESSION: : Good response again to DN combined with Estim for peroneal activation. Continued to work to improve his ankle EV weakness and hip weakness as tolerated. He will continue to benefit form skilled PT to  address these deficits.   OBJECTIVE IMPAIRMENTS: decreased activity tolerance, difficulty walking, decreased balance, decreased endurance, decreased mobility, decreased ROM, decreased strength, impaired flexibility, impaired LE use, postural dysfunction, and pain.  ACTIVITY LIMITATIONS: bending, lifting, carry, locomotion, cleaning, community activity, and or occupation   PERSONAL FACTORS: Lt peroneal nerve decompression, foot drop,2020 Colon Cancer, Neuropathy in feet, PVD, prostate cancer, hernia repair are also affecting patient's functional outcome.   REHAB POTENTIAL: Fair     CLINICAL DECISION MAKING: Stable/uncomplicated   EVALUATION COMPLEXITY: Low       GOALS: Short term PT Goals Target date: 11/21/2021 Pt will be I and compliant with HEP. Baseline:  Goal status: MET Pt will decrease pain by 25% overall with prolonged standing Baseline: Goal status:MET   Long term PT goals Target date: 03/27/2022 Pt will improve left ankle ROM to WFL (10 deg EV and DF) in supine to improve functional mobility Baseline: Goal status: ongong 01/23/22 Pt will improve  hip/knee strength to at least 5-/5 MMT in sitting, and ankle EV strength to 4/5, ankle DF strength to 4+ to improve functional strength Baseline: Goal status: ongoing Pt will improve FOTO to at least 69% functional to show improved function Baseline: Goal status: Discontinued, incorrect body part/set up Pt will reduce pain by overall 50% overall with usual activity and prolonged standing Baseline: Goal status: MET 01/23/33    PLAN: PT FREQUENCY: 1-3 times per week    PT DURATION: 6-8 weeks   PLANNED INTERVENTIONS (unless contraindicated): aquatic PT, Canalith repositioning, cryotherapy, Electrical stimulation, Iontophoresis with 4 mg/ml dexamethasome, Moist heat, traction, Ultrasound, gait training, Therapeutic exercise, balance training, neuromuscular re-education, patient/family education, prosthetic training, manual  techniques, passive ROM, dry needling, taping, vasopnuematic device, vestibular, spinal manipulations, joint manipulations   PLAN FOR NEXT SESSION: DF and EV strength, balance, general leg strength, DN with stim and or [place] for peroneal recuitment    Debbe Odea, PT,DPT 01/26/2022, 8:12 AM

## 2022-01-31 ENCOUNTER — Encounter: Payer: Self-pay | Admitting: Physical Therapy

## 2022-01-31 ENCOUNTER — Ambulatory Visit (INDEPENDENT_AMBULATORY_CARE_PROVIDER_SITE_OTHER): Payer: Medicare Other | Admitting: Physical Therapy

## 2022-01-31 DIAGNOSIS — M25552 Pain in left hip: Secondary | ICD-10-CM

## 2022-01-31 DIAGNOSIS — R262 Difficulty in walking, not elsewhere classified: Secondary | ICD-10-CM | POA: Diagnosis not present

## 2022-01-31 DIAGNOSIS — M79662 Pain in left lower leg: Secondary | ICD-10-CM | POA: Diagnosis not present

## 2022-01-31 DIAGNOSIS — M6281 Muscle weakness (generalized): Secondary | ICD-10-CM

## 2022-01-31 NOTE — Therapy (Signed)
OUTPATIENT PHYSICAL THERAPY TREATMENT NOTE     Patient Name: Oscar Sparks MRN: [id number] DOB:Aug 17, 1940, 81 y.o., male Today's Date: 01/31/2022   END OF SESSION:   PT End of Session - 01/31/22 0759     Visit Number 21    Number of Visits 30    Date for PT Re-Evaluation 03/27/22    Authorization Type MCR    Progress Note Due on Visit 51    PT Start Time 0800    PT Stop Time 0845    PT Time Calculation (min) 45 min    Activity Tolerance Patient tolerated treatment well    Behavior During Therapy [facility] for tasks assessed/performed             Past Medical History:  Diagnosis Date   Anemia    Arthritis    knees   Colon cancer ([place]) 09/24/2018   ED (erectile dysfunction)    Inguinal hernia    Neuromuscular disorder (HCC)    neuropathy in feet   Peripheral vascular disease ([place])    poor curculation in hands and feet hard to monitor with pulse oximetry   Prostate cancer ([place])    sees Dr. Gaynelle Arabian, received cesium seeds    Past Surgical History:  Procedure Laterality Date   BACK SURGERY     INGUINAL HERNIA REPAIR Right 05/20/2021   Procedure: OPEN RIGHT INGUINAL HERNIA REPAIR WITH MESH;  Surgeon: Jovita Kussmaul, MD;  Location: [place];  Service: General;  Laterality: Right;   SPINE SURGERY  2004   Dr. 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 He does not have any pain.    PERTINENT HISTORY: Lt peroneal nerve decompression, foot drop,2020 Colon Cancer, Neuropathy in feet, PVD, prostate cancer, hernia repair   PAIN:  Are you having pain? Yes: NPRS scale: 0/10 Pain location: left hip Pain description: inconvience Aggravating factors: prolonged standing or walking Relieving factors: rest, NSAIDS   PRECAUTIONS: Fall   WEIGHT BEARING RESTRICTIONS No   FALLS:  Has patient fallen in last 6 months? No, but has had several near falls   OCCUPATION: retired   PLOF: Independent with basic ADLs   PATIENT GOALS improve walking and balance by reducing foot drop  OBJECTIVE:    DIAGNOSTIC FINDINGS: nothing recent   PATIENT SURVEYS:  FOTO 61% functional intake at eval, goal is 69%   COGNITION:           Overall cognitive status: Within functional limits for tasks assessed                              LOWER EXTREMITY ROM:   Active ROM Left eval Left 11/17/21 Left 12/06/21 Left 01/10/22 Left 01/23/22  Hip flexion        Hip extension        Hip abduction        Hip adduction        Hip internal rotation        Hip external rotation        Knee flexion        Knee extension        Ankle dorsiflexion _0 Ankle plantarflexion WNL      Ankle inversion WNL      Ankle eversion _1 (Blank rows = not  tested)   LOWER EXTREMITY MMT:   MMT in sitting/supine Left eval Left 12/06/21 Left 01/10/22 Left 01/23/22  Hip flexion 3+ 4    Hip extension       Hip abduction 3 4    Hip adduction       Hip internal rotation       Hip external rotation       Knee flexion       Knee extension       Ankle dorsiflexion 4 4+    Ankle plantarflexion 4 4+    Ankle inversion 5 5    Ankle eversion 2+ 3- 3 3+   (Blank rows = not tested)   LOWER EXTREMITY SPECIAL TESTS:      FUNCTIONAL TESTS:  10/24/21: 5 times sit to stand: 11.74 seconds without UE support   GAIT: Distance walked: Buyer, retail device utilized: Single point cane Level of assistance: Modified independence Comments: foot drop noted on left with trendelenburg       TODAY'S TREATMENT: 01/31/22 -recumbent bike L4 X 8 min -Slantboard 30 sec X 3 -Heel and toe raises 2X15 bilat -Tandem balance 30 sec X 2 bilat -Leg press DL 125#2X15, then SL each side 56# 2X15 -Sidestepping with green band and monster walk with green band at counter top X 4 each -Supine ankle DF and PF with green X20, EV with green X15  -E-stim russian/NMES for motor recruitment of peroneals/Tibialis anterior 5/5 sec on/off with intensity to tolerance and active EV/DF contraction/AROM for 5 sec hold during on phase in sidelying X 10 total minutes   01/26/22 -Nu step L6 X 9 min seat 12 -Slantboard 30 sec X 3 -Heel and toe raises 2X15 bilat -Tandem balance 30 sec X 2 bilat -Leg press DL 106#2X15, then SL each side 56# 2X15 -Sidestepping with green band and monster walk with green band at counter top X 3 -Supine ankle DF and PF with green X20, EV with green X15  --Manual therapy for skilled palpation and compression with Trigger Point Dry-Needling   Patient Consent Given: Yes Education handout provided: previously provided Muscles treated: left peroneal for muscle activation with Estim frequency 2 and intensity 3-4 to tolerance Treatment  response/outcome: twitch response noted bilaterally     PATIENT EDUCATION:  Education details: exam findings and  PT plan of care Person educated: Patient Education method: Explanation Education comprehension: verbalized understanding     HOME EXERCISE PROGRAM: Will review his HEP from last episode of care and revise PRN next session       ASSESSMENT:   CLINICAL IMPRESSION: : Switched to NMES to facilitate EV and DF contractions today with AROM to recruit his peroneal muscles that continue to be week. He had good tolerance to this and on a positive note he was able to perform more weight on the leg press. He will continue to benefit form skilled PT to address his functional deficits.   OBJECTIVE IMPAIRMENTS: decreased activity tolerance, difficulty walking, decreased balance, decreased endurance, decreased mobility, decreased ROM, decreased strength, impaired flexibility, impaired LE use, postural dysfunction, and pain.   ACTIVITY LIMITATIONS: bending, lifting, carry, locomotion, cleaning, community activity, and or occupation   PERSONAL FACTORS: Lt peroneal nerve decompression, foot drop,2020 Colon Cancer, Neuropathy in feet, PVD, prostate cancer, hernia repair are also affecting patient's functional outcome.   REHAB POTENTIAL: Fair     CLINICAL DECISION MAKING: Stable/uncomplicated   EVALUATION COMPLEXITY: Low       GOALS: Short term PT Goals Target date: 11/21/2021 Pt will be I and compliant with HEP. Baseline:  Goal status: MET Pt will decrease pain by 25% overall with prolonged standing Baseline: Goal status:MET   Long term PT goals Target date: 03/27/2022 Pt will improve left ankle ROM to WFL (10 deg EV and DF) in supine to improve functional mobility Baseline: Goal status: ongong 01/23/22 Pt will improve  hip/knee strength to at least 5-/5 MMT in sitting, and ankle EV strength to 4/5, ankle DF strength to 4+ to improve functional strength Baseline: Goal status:  ongoing Pt will improve FOTO to at least 69% functional to show improved function Baseline: Goal status: Discontinued, incorrect body part/set up Pt will reduce pain by overall 50% overall with usual activity and prolonged standing Baseline: Goal status: MET 01/23/33    PLAN: PT FREQUENCY: 1-3 times per week    PT DURATION: 6-8 weeks   PLANNED INTERVENTIONS (unless contraindicated): aquatic PT, Canalith repositioning, cryotherapy, Electrical stimulation, Iontophoresis with 4 mg/ml dexamethasome, Moist heat, traction, Ultrasound, gait training, Therapeutic exercise, balance training, neuromuscular re-education, patient/family education, prosthetic training, manual techniques, passive ROM, dry needling, taping, vasopnuematic device, vestibular, spinal manipulations, joint manipulations   PLAN FOR NEXT SESSION: DF and EV strength, balance, general leg strength, DN with stim and or [place] for peroneal recuitment    Debbe Odea, PT,DPT 01/31/2022, 7:59 AM

## 2022-02-02 ENCOUNTER — Ambulatory Visit (INDEPENDENT_AMBULATORY_CARE_PROVIDER_SITE_OTHER): Payer: Medicare Other | Admitting: Physical Therapy

## 2022-02-02 DIAGNOSIS — M25552 Pain in left hip: Secondary | ICD-10-CM | POA: Diagnosis not present

## 2022-02-02 DIAGNOSIS — M79662 Pain in left lower leg: Secondary | ICD-10-CM | POA: Diagnosis not present

## 2022-02-02 DIAGNOSIS — M6281 Muscle weakness (generalized): Secondary | ICD-10-CM | POA: Diagnosis not present

## 2022-02-02 DIAGNOSIS — R262 Difficulty in walking, not elsewhere classified: Secondary | ICD-10-CM | POA: Diagnosis not present

## 2022-02-02 NOTE — Therapy (Signed)
OUTPATIENT PHYSICAL THERAPY TREATMENT NOTE     Patient Name: Oscar Sparks MRN: [id number] DOB:02/12/1941, 81 y.o., male Today's Date: 02/02/2022   END OF SESSION:   PT End of Session - 02/02/22 0804     Visit Number 22    Number of Visits 30    Date for PT Re-Evaluation 03/27/22    Authorization Type MCR    Progress Note Due on Visit 29    PT Start Time 0800    PT Stop Time 0845    PT Time Calculation (min) 45 min    Activity Tolerance Patient tolerated treatment well    Behavior During Therapy WFL for tasks assessed/performed             Past Medical History:  Diagnosis Date   Anemia    Arthritis    knees   Colon cancer (HCC) 09/24/2018   ED (erectile dysfunction)    Inguinal hernia    Neuromuscular disorder (HCC)    neuropathy in feet   Peripheral vascular disease (HCC)    poor curculation in hands and feet hard to monitor with pulse oximetry   Prostate cancer (HCC)    sees Dr. Tannenbaum, received cesium seeds    Past Surgical History:  Procedure Laterality Date   BACK SURGERY     INGUINAL HERNIA REPAIR Right 05/20/2021   Procedure: OPEN RIGHT INGUINAL HERNIA REPAIR WITH MESH;  Surgeon: Toth, Paul III, MD;  Location: [facility];  Service: General;  Laterality: Right;   SPINE SURGERY  2004   Dr. 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 Xu    REFERRING DIAG: G57.32 (ICD-10-CM) - Peroneal neuropathy at knee, left    Rationale for Evaluation and Treatment Rehabilitation   ONSET DATE: 05/2020 S/P peroneal nerve decompression   SUBJECTIVE:    SUBJECTIVE STATEMENT: He states he feels good today, no pain to report   PERTINENT HISTORY: Lt peroneal nerve decompression, foot drop,2020 Colon Cancer, Neuropathy in feet, PVD, prostate cancer, hernia repair   PAIN:  Are you having pain? Yes: NPRS scale: 0/10 Pain location: left hip Pain description: inconvience Aggravating factors: prolonged standing or walking Relieving factors: rest, NSAIDS   PRECAUTIONS: Fall   WEIGHT BEARING RESTRICTIONS No   FALLS:  Has patient fallen in last 6 months? No, but has had several near falls   OCCUPATION: retired   PLOF: Independent with basic ADLs   PATIENT GOALS improve walking and balance by reducing foot drop     OBJECTIVE:    DIAGNOSTIC FINDINGS: nothing recent     PATIENT SURVEYS:  FOTO 61% functional intake at eval, goal is 69%   COGNITION:           Overall cognitive status: Within functional limits for tasks assessed                              LOWER EXTREMITY ROM:   Active ROM Left eval Left 11/17/21 Left 12/06/21 Left 01/10/22 Left 01/23/22  Hip flexion        Hip extension        Hip abduction        Hip adduction        Hip internal rotation        Hip external rotation        Knee flexion        Knee extension        Ankle dorsiflexion 5 7 7 7 8  Ankle plantarflexion WNL      Ankle inversion WNL      Ankle eversion 5 10 10 10 10   (Blank rows = not tested)   LOWER EXTREMITY MMT:    MMT in sitting/supine Left eval Left 12/06/21 Left 01/10/22 Left 01/23/22  Hip flexion 3+ 4    Hip extension       Hip abduction 3 4    Hip adduction       Hip internal rotation       Hip external rotation       Knee flexion       Knee extension       Ankle dorsiflexion 4 4+    Ankle plantarflexion 4 4+    Ankle inversion 5 5    Ankle eversion 2+ 3- 3 3+   (Blank rows = not tested)   LOWER EXTREMITY SPECIAL TESTS:      FUNCTIONAL TESTS:  10/24/21: 5 times sit to stand: 11.74 seconds without UE support   GAIT: Distance walked: community ambulator Assistive device utilized: Single point cane Level of assistance: Modified independence Comments: foot drop noted on left with trendelenburg       TODAY'S TREATMENT: 02/02/22 -recumbent bike L4 X 8 mind -Slantboard 30 sec X 3 -Heel and toe raises 2X15 bilat -Tandem balance 30 sec X 2 bilat -Leg press DL 106#2X15, then SL each side 56# 2X15 -Sidestepping with green band and monster walk with green band at counter top X 3 -Supine ankle DF and PF with green X20, EV with green X15  --Manual therapy for skilled palpation and compression with Trigger Point Dry-Needling   Patient Consent Given: Yes Education handout provided: previously provided Muscles treated: left peroneal for muscle activation with Estim frequency 2 and intensity 3-4 to tolerance Treatment response/outcome: twitch response noted bilaterally  01/31/22 -recumbent bike L4 X 8 min -Slantboard 30 sec X 3 -Heel and toe raises 2X15 bilat -Tandem balance 30 sec X 2 bilat -Leg press DL 125#2X15, then SL each side 56# 2X15 -Sidestepping with green band and monster walk with green band at counter top X 4 each -Supine ankle DF and PF with green X20, EV with green X15  -E-stim russian/NMES for motor recruitment of peroneals/Tibialis anterior 5/5 sec on/off with intensity to tolerance and active EV/DF contraction/AROM for 5 sec hold during on phase in sidelying X 10  total minutes     PATIENT EDUCATION:  Education details: exam findings and PT plan of care Person educated: Patient Education method: Explanation Education comprehension: verbalized   understanding     HOME EXERCISE PROGRAM: Will review his HEP from last episode of care and revise PRN next session       ASSESSMENT:   CLINICAL IMPRESSION: : He did appear to have better AROM and strength for ankle EV compared to last session but this continues to be lacking. He will continue to benefit form skilled PT to address his functional deficits.   OBJECTIVE IMPAIRMENTS: decreased activity tolerance, difficulty walking, decreased balance, decreased endurance, decreased mobility, decreased ROM, decreased strength, impaired flexibility, impaired LE use, postural dysfunction, and pain.   ACTIVITY LIMITATIONS: bending, lifting, carry, locomotion, cleaning, community activity, and or occupation   PERSONAL FACTORS: Lt peroneal nerve decompression, foot drop,2020 Colon Cancer, Neuropathy in feet, PVD, prostate cancer, hernia repair are also affecting patient's functional outcome.   REHAB POTENTIAL: Fair     CLINICAL DECISION MAKING: Stable/uncomplicated   EVALUATION COMPLEXITY: Low       GOALS: Short term PT Goals Target date: 11/21/2021 Pt will be I and compliant with HEP. Baseline:  Goal status: MET Pt will decrease pain by 25% overall with prolonged standing Baseline: Goal status:MET   Long term PT goals Target date: 03/27/2022 Pt will improve left ankle ROM to WFL (10 deg EV and DF) in supine to improve functional mobility Baseline: Goal status: ongong 01/23/22 Pt will improve  hip/knee strength to at least 5-/5 MMT in sitting, and ankle EV strength to 4/5, ankle DF strength to 4+ to improve functional strength Baseline: Goal status: ongoing Pt will improve FOTO to at least 69% functional to show improved function Baseline: Goal status: Discontinued, incorrect body part/set up Pt will  reduce pain by overall 50% overall with usual activity and prolonged standing Baseline: Goal status: MET 01/23/33    PLAN: PT FREQUENCY: 1-3 times per week    PT DURATION: 6-8 weeks   PLANNED INTERVENTIONS (unless contraindicated): aquatic PT, Canalith repositioning, cryotherapy, Electrical stimulation, Iontophoresis with 4 mg/ml dexamethasome, Moist heat, traction, Ultrasound, gait training, Therapeutic exercise, balance training, neuromuscular re-education, patient/family education, prosthetic training, manual techniques, passive ROM, dry needling, taping, vasopnuematic device, vestibular, spinal manipulations, joint manipulations   PLAN FOR NEXT SESSION: DF and EV strength, balance, general leg strength, DN with stim and or [place] for peroneal recuitment    Debbe Odea, PT,DPT 02/02/2022, 8:05 AM

## 2022-02-06 ENCOUNTER — Encounter: Payer: Self-pay | Admitting: Physical Therapy

## 2022-02-06 ENCOUNTER — Ambulatory Visit (INDEPENDENT_AMBULATORY_CARE_PROVIDER_SITE_OTHER): Payer: Medicare Other | Admitting: Physical Therapy

## 2022-02-06 DIAGNOSIS — M79662 Pain in left lower leg: Secondary | ICD-10-CM

## 2022-02-06 DIAGNOSIS — M25552 Pain in left hip: Secondary | ICD-10-CM | POA: Diagnosis not present

## 2022-02-06 DIAGNOSIS — M6281 Muscle weakness (generalized): Secondary | ICD-10-CM | POA: Diagnosis not present

## 2022-02-06 DIAGNOSIS — R262 Difficulty in walking, not elsewhere classified: Secondary | ICD-10-CM | POA: Diagnosis not present

## 2022-02-06 NOTE — Therapy (Signed)
OUTPATIENT PHYSICAL THERAPY TREATMENT NOTE     Patient Name: Oscar Sparks MRN: [id number] DOB:Jun 19, 1940, 81 y.o., male Today's Date: 02/06/2022   END OF SESSION:   PT End of Session - 02/06/22 0844     Visit Number 23    Number of Visits 30    Date for PT Re-Evaluation 03/27/22    Authorization Type MCR    Progress Note Due on Visit 21    PT Start Time 0840    PT Stop Time 0925    PT Time Calculation (min) 45 min    Activity Tolerance Patient tolerated treatment well    Behavior During Therapy [facility] for tasks assessed/performed             Past Medical History:  Diagnosis Date   Anemia    Arthritis    knees   Colon cancer ([place]) 09/24/2018   ED (erectile dysfunction)    Inguinal hernia    Neuromuscular disorder (HCC)    neuropathy in feet   Peripheral vascular disease ([place])    poor curculation in hands and feet hard to monitor with pulse oximetry   Prostate cancer ([place])    sees Dr. Gaynelle Arabian, received cesium seeds    Past Surgical History:  Procedure Laterality Date   BACK SURGERY     INGUINAL HERNIA REPAIR Right 05/20/2021   Procedure: OPEN RIGHT INGUINAL HERNIA REPAIR WITH MESH;  Surgeon: Jovita Kussmaul, MD;  Location: [place];  Service: General;  Laterality: Right;   SPINE SURGERY  2004   Dr. 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 Yes: NPRS scale: 0/10 Pain location: left hip Pain description: inconvience Aggravating factors: prolonged standing or walking Relieving factors: rest, NSAIDS   PRECAUTIONS: Fall   WEIGHT BEARING RESTRICTIONS No   FALLS:  Has patient fallen in last 6 months? No, but has had several near falls   OCCUPATION: retired   PLOF: Independent with basic ADLs   PATIENT GOALS improve walking and balance by reducing foot drop     OBJECTIVE:    DIAGNOSTIC FINDINGS: nothing recent  PATIENT SURVEYS:  FOTO 61% functional intake at eval, goal is 69%   COGNITION:           Overall cognitive status: Within functional limits for tasks assessed                              LOWER EXTREMITY ROM:   Active ROM Left eval Left 11/17/21 Left 12/06/21 Left 01/10/22 Left 01/23/22  Hip flexion        Hip extension        Hip abduction        Hip adduction        Hip internal rotation        Hip external rotation        Knee flexion        Knee extension        Ankle dorsiflexion _0 Ankle plantarflexion WNL      Ankle inversion WNL      Ankle eversion _1 (Blank rows = not tested)   LOWER EXTREMITY MMT:    MMT in sitting/supine Left eval Left 12/06/21 Left 01/10/22 Left 01/23/22  Hip flexion 3+ 4    Hip extension       Hip abduction 3 4    Hip adduction       Hip internal rotation       Hip external rotation       Knee flexion       Knee extension       Ankle dorsiflexion 4 4+    Ankle plantarflexion 4 4+    Ankle inversion 5 5    Ankle eversion 2+ 3- 3 3+   (Blank rows = not tested)   LOWER EXTREMITY SPECIAL TESTS:      FUNCTIONAL TESTS:  10/24/21: 5 times sit to stand: 11.74 seconds without UE support   GAIT: Distance walked: Buyer, retail device utilized: Single point cane Level of assistance: Modified independence Comments: foot drop noted on left with trendelenburg       TODAY'S TREATMENT: 02/06/22 -recumbent bike L4 X 8 min -Slantboard 30 sec X 3 -Heel raises eccentric up with both and down with left only 2X10 -Heel and toe raises X 15 bilat -Leg press DL 125#2X15, then SL each side 75# 2X15 -Tandem walk on foam 4 round trips in bars -Rocker board 1.5 min lateral, 1.5 min A-P -Supine ankle circles X 20 each and alphabet X 1 with 3# around toes  -E-stim russian/NMES for motor recruitment of peroneals/Tibialis anterior 5/5 sec on/off with intensity to tolerance and active EV/DF contraction/AROM for 5 sec hold during on phase in sidelying X 10 total minutes   02/02/22 -recumbent bike L4 X 8 mind -Slantboard 30 sec X 3 -Heel and toe raises 2X15 bilat -Tandem balance 30 sec X 2 bilat -Leg press DL 106#2X15, then SL each side 56# 2X15 -Sidestepping with green band and monster walk with green band at counter top X 3 -Supine ankle DF and PF with green X20, EV with green X15  --Manual therapy for skilled palpation and compression with Trigger Point Dry-Needling   Patient Consent Given: Yes Education handout provided: previously provided Muscles treated: left peroneal for muscle activation with Estim frequency 2 and intensity 3-4 to  tolerance Treatment response/outcome: twitch response noted bilaterally  01/31/22 -recumbent bike L4 X 8 min -Slantboard 30 sec X 3 -Heel and  toe raises 2X15 bilat -Tandem balance 30 sec X 2 bilat -Leg press DL 125#2X15, then SL each side 56# 2X15 -Sidestepping with green band and monster walk with green band at counter top X 4 each -Supine ankle DF and PF with green X20, EV with green X15  -E-stim russian/NMES for motor recruitment of peroneals/Tibialis anterior 5/5 sec on/off with intensity to tolerance and active EV/DF contraction/AROM for 5 sec hold during on phase in sidelying X 10 total minutes     PATIENT EDUCATION:  Education details: exam findings and PT plan of care Person educated: Patient Education method: Explanation Education comprehension: verbalized understanding     HOME EXERCISE PROGRAM: Will review his HEP from last episode of care and revise PRN next session       ASSESSMENT:   CLINICAL IMPRESSION: : Challenged him more today adding foam surface and rocker board for ankle/balance/proprioception challenges, as well as added ankle circles and alphabet with 3# weight around his toes. His left ankle was fatigued after session. He will continue to benefit form skilled PT to address his functional deficits.   OBJECTIVE IMPAIRMENTS: decreased activity tolerance, difficulty walking, decreased balance, decreased endurance, decreased mobility, decreased ROM, decreased strength, impaired flexibility, impaired LE use, postural dysfunction, and pain.   ACTIVITY LIMITATIONS: bending, lifting, carry, locomotion, cleaning, community activity, and or occupation   PERSONAL FACTORS: Lt peroneal nerve decompression, foot drop,2020 Colon Cancer, Neuropathy in feet, PVD, prostate cancer, hernia repair are also affecting patient's functional outcome.   REHAB POTENTIAL: Fair     CLINICAL DECISION MAKING: Stable/uncomplicated   EVALUATION COMPLEXITY: Low       GOALS: Short  term PT Goals Target date: 11/21/2021 Pt will be I and compliant with HEP. Baseline:  Goal status: MET Pt will decrease pain by 25% overall with prolonged standing Baseline: Goal status:MET   Long term PT goals Target date: 03/27/2022 Pt will improve left ankle ROM to WFL (10 deg EV and DF) in supine to improve functional mobility Baseline: Goal status: ongong 01/23/22 Pt will improve  hip/knee strength to at least 5-/5 MMT in sitting, and ankle EV strength to 4/5, ankle DF strength to 4+ to improve functional strength Baseline: Goal status: ongoing Pt will improve FOTO to at least 69% functional to show improved function Baseline: Goal status: Discontinued, incorrect body part/set up Pt will reduce pain by overall 50% overall with usual activity and prolonged standing Baseline: Goal status: MET 01/23/33    PLAN: PT FREQUENCY: 1-3 times per week    PT DURATION: 6-8 weeks   PLANNED INTERVENTIONS (unless contraindicated): aquatic PT, Canalith repositioning, cryotherapy, Electrical stimulation, Iontophoresis with 4 mg/ml dexamethasome, Moist heat, traction, Ultrasound, gait training, Therapeutic exercise, balance training, neuromuscular re-education, patient/family education, prosthetic training, manual techniques, passive ROM, dry needling, taping, vasopnuematic device, vestibular, spinal manipulations, joint manipulations   PLAN FOR NEXT SESSION: DF and EV strength, balance, general leg strength, DN with stim and or [place] for peroneal recuitment    Debbe Odea, PT,DPT 02/06/2022, 8:54 AM

## 2022-02-08 ENCOUNTER — Encounter: Payer: Self-pay | Admitting: Physical Therapy

## 2022-02-08 ENCOUNTER — Ambulatory Visit (INDEPENDENT_AMBULATORY_CARE_PROVIDER_SITE_OTHER): Payer: Medicare Other | Admitting: Physical Therapy

## 2022-02-08 DIAGNOSIS — M6281 Muscle weakness (generalized): Secondary | ICD-10-CM | POA: Diagnosis not present

## 2022-02-08 DIAGNOSIS — R262 Difficulty in walking, not elsewhere classified: Secondary | ICD-10-CM | POA: Diagnosis not present

## 2022-02-08 DIAGNOSIS — M25552 Pain in left hip: Secondary | ICD-10-CM | POA: Diagnosis not present

## 2022-02-08 DIAGNOSIS — M79662 Pain in left lower leg: Secondary | ICD-10-CM | POA: Diagnosis not present

## 2022-02-08 NOTE — Therapy (Signed)
OUTPATIENT PHYSICAL THERAPY TREATMENT NOTE     Patient Name: Oscar Sparks MRN: [id number] DOB:[birth year], 81 y.o., male Today's Date: 02/08/2022   END OF SESSION:   PT End of Session - 02/08/22 0900     Visit Number 24    Number of Visits 30    Date for PT Re-Evaluation 03/27/22    Authorization Type MCR    Progress Note Due on Visit 31    PT Start Time 0845    PT Stop Time 0930    PT Time Calculation (min) 45 min    Activity Tolerance Patient tolerated treatment well    Behavior During Therapy [facility] for tasks assessed/performed             Past Medical History:  Diagnosis Date   Anemia    Arthritis    knees   Colon cancer ([place]) 09/24/2018   ED (erectile dysfunction)    Inguinal hernia    Neuromuscular disorder (HCC)    neuropathy in feet   Peripheral vascular disease ([employer])    poor curculation in hands and feet hard to monitor with pulse oximetry   Prostate cancer ([place])    sees Dr. Gaynelle Arabian, received cesium seeds    Past Surgical History:  Procedure Laterality Date   BACK SURGERY     INGUINAL HERNIA REPAIR Right 05/20/2021   Procedure: OPEN RIGHT INGUINAL HERNIA REPAIR WITH MESH;  Surgeon: Jovita Kussmaul, MD;  Location: [place];  Service: General;  Laterality: Right;   SPINE SURGERY  2004   Dr. 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 Denies pain.    PERTINENT HISTORY: Lt peroneal nerve decompression, foot drop,2020 Colon Cancer, Neuropathy in feet, PVD, prostate cancer, hernia repair   PAIN:  Are you having pain? Yes: NPRS scale: 0/10 Pain location: left hip Pain description: inconvience Aggravating factors: prolonged standing or walking Relieving factors: rest, NSAIDS   PRECAUTIONS: Fall   WEIGHT BEARING RESTRICTIONS No   FALLS:  Has patient fallen in last 6 months? No, but has had several near falls   OCCUPATION: retired   PLOF: Independent with basic ADLs   PATIENT GOALS improve walking and balance by reducing foot drop     OBJECTIVE:    DIAGNOSTIC FINDINGS: nothing  recent   PATIENT SURVEYS:  FOTO 61% functional intake at eval, goal is 69%   COGNITION:           Overall cognitive status: Within functional limits for tasks assessed                              LOWER EXTREMITY ROM:   Active ROM Left eval Left 11/17/21 Left 12/06/21 Left 01/10/22 Left 01/23/22  Hip flexion        Hip extension        Hip abduction        Hip adduction        Hip internal rotation        Hip external rotation        Knee flexion        Knee extension        Ankle dorsiflexion _0 Ankle plantarflexion WNL      Ankle inversion WNL      Ankle eversion _1 (Blank rows = not tested)    LOWER EXTREMITY MMT:   MMT in sitting/supine Left eval Left 12/06/21 Left 01/10/22 Left 01/23/22  Hip flexion 3+ 4    Hip extension       Hip abduction 3 4    Hip adduction       Hip internal rotation       Hip external rotation       Knee flexion       Knee extension       Ankle dorsiflexion 4 4+    Ankle plantarflexion 4 4+    Ankle inversion 5 5    Ankle eversion 2+ 3- 3 3+   (Blank rows = not tested)   LOWER EXTREMITY SPECIAL TESTS:      FUNCTIONAL TESTS:  10/24/21: 5 times sit to stand: 11.74 seconds without UE support   GAIT: Distance walked: Information systems manager utilized: Single point cane Level of assistance: Modified independence Comments: foot drop noted on left with trendelenburg       TODAY'S TREATMENT: 02/08/22 -Nu step L6 seat # 12 X 8 min -Slantboard 30 sec X 3 -Heel raises eccentric up with both and down with left only 2X10 -Heel and toe raises X 15 bilat -Leg press DL 125#2X15, then SL each side 75# 2X15 -Single leg stance 15 sec X 5 bilat -SLS on foam pad with 2 cone toe taps X 10 bilat, min A needed for balance at times -SLS with tennis ball rolls X 10 CW and CCW bilat -Supine ankle circles X 15 each and alphabet X 1 with 3# around toes  --Manual therapy for skilled palpation and compression with Trigger Point Dry-Needling   Patient Consent Given: Yes Education handout provided: previously provided Muscles treated: left peroneal for muscle activation with Estim frequency 2 and intensity 3-4 to tolerance Treatment response/outcome: twitch response noted bilaterally    PATIENT EDUCATION:  Education details: exam findings and PT plan of care Person educated: Patient Education method: Explanation Education comprehension: verbalized understanding     HOME EXERCISE PROGRAM: Will review his HEP from last episode of care and revise PRN next session       ASSESSMENT:   CLINICAL IMPRESSION: : Added more Single leg balance  activities today to also work on  ankle/balance/proprioception challenges.  He will continue to benefit form skilled PT to address his functional  deficits.   OBJECTIVE IMPAIRMENTS: decreased activity tolerance, difficulty walking, decreased balance, decreased endurance, decreased mobility, decreased ROM, decreased strength, impaired flexibility, impaired LE use, postural dysfunction, and pain.   ACTIVITY LIMITATIONS: bending, lifting, carry, locomotion, cleaning, community activity, and or occupation   PERSONAL FACTORS: Lt peroneal nerve decompression, foot drop,2020 Colon Cancer, Neuropathy in feet, PVD, prostate cancer, hernia repair are also affecting patient's functional outcome.   REHAB POTENTIAL: Fair     CLINICAL DECISION MAKING: Stable/uncomplicated   EVALUATION COMPLEXITY: Low       GOALS: Short term PT Goals Target date: 11/21/2021 Pt will be I and compliant with HEP. Baseline:  Goal status: MET Pt will decrease pain by 25% overall with prolonged standing Baseline: Goal status:MET   Long term PT goals Target date: 03/27/2022 Pt will improve left ankle ROM to WFL (10 deg EV and DF) in supine to improve functional mobility Baseline: Goal status: ongong 01/23/22 Pt will improve  hip/knee strength to at least 5-/5 MMT in sitting, and ankle EV strength to 4/5, ankle DF strength to 4+ to improve functional strength Baseline: Goal status: ongoing Pt will improve FOTO to at least 69% functional to show improved function Baseline: Goal status: Discontinued, incorrect body part/set up Pt will reduce pain by overall 50% overall with usual activity and prolonged standing Baseline: Goal status: MET 01/23/33    PLAN: PT FREQUENCY: 1-3 times per week    PT DURATION: 6-8 weeks   PLANNED INTERVENTIONS (unless contraindicated): aquatic PT, Canalith repositioning, cryotherapy, Electrical stimulation, Iontophoresis with 4 mg/ml dexamethasome, Moist heat, traction, Ultrasound, gait  training, Therapeutic exercise, balance training, neuromuscular re-education, patient/family education, prosthetic training, manual techniques, passive ROM, dry needling, taping, vasopnuematic device, vestibular, spinal manipulations, joint manipulations   PLAN FOR NEXT SESSION: DF and EV strength, balance, general leg strength, DN with stim and or [place] for peroneal recuitment    Debbe Odea, PT,DPT 02/08/2022, 9:27 AM

## 2022-02-14 ENCOUNTER — Encounter: Payer: Self-pay | Admitting: Physical Therapy

## 2022-02-14 ENCOUNTER — Ambulatory Visit (INDEPENDENT_AMBULATORY_CARE_PROVIDER_SITE_OTHER): Payer: Medicare Other | Admitting: Physical Therapy

## 2022-02-14 DIAGNOSIS — M25552 Pain in left hip: Secondary | ICD-10-CM | POA: Diagnosis not present

## 2022-02-14 DIAGNOSIS — M79662 Pain in left lower leg: Secondary | ICD-10-CM

## 2022-02-14 DIAGNOSIS — M6281 Muscle weakness (generalized): Secondary | ICD-10-CM

## 2022-02-14 DIAGNOSIS — R262 Difficulty in walking, not elsewhere classified: Secondary | ICD-10-CM | POA: Diagnosis not present

## 2022-02-14 NOTE — Therapy (Addendum)
OUTPATIENT PHYSICAL THERAPY TREATMENT NOTE     Patient Name: Oscar Sparks MRN: [id number] DOB:1940/07/16, 81 y.o., male Today's Date: 02/14/2022   END OF SESSION:   PT End of Session - 02/14/22 0759     Visit Number 25    Number of Visits 30    Date for PT Re-Evaluation 03/27/22    Authorization Type MCR    Progress Note Due on Visit 62    PT Start Time 0800    PT Stop Time 0845    PT Time Calculation (min) 45 min    Activity Tolerance Patient tolerated treatment well    Behavior During Therapy [facility] for tasks assessed/performed             Past Medical History:  Diagnosis Date   Anemia    Arthritis    knees   Colon cancer ([place]) 09/24/2018   ED (erectile dysfunction)    Inguinal hernia    Neuromuscular disorder (HCC)    neuropathy in feet   Peripheral vascular disease ([place])    poor curculation in hands and feet hard to monitor with pulse oximetry   Prostate cancer ([place])    sees Dr. Gaynelle Arabian, received cesium seeds    Past Surgical History:  Procedure Laterality Date   BACK SURGERY     INGUINAL HERNIA REPAIR Right 05/20/2021   Procedure: OPEN RIGHT INGUINAL HERNIA REPAIR WITH MESH;  Surgeon: Jovita Kussmaul, MD;  Location: [place];  Service: General;  Laterality: Right;   SPINE SURGERY  2004   Dr. 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 Yes: NPRS scale: 0/10 Pain location: left hip Pain description: inconvience Aggravating factors: prolonged standing or walking Relieving factors: rest, NSAIDS   PRECAUTIONS: Fall   WEIGHT BEARING RESTRICTIONS No   FALLS:  Has patient fallen in last 6 months? No, but has had several near falls   OCCUPATION: retired   PLOF: Independent with basic ADLs   PATIENT GOALS improve walking and balance by reducing foot drop     OBJECTIVE:    DIAGNOSTIC FINDINGS: nothing recent  PATIENT SURVEYS:  FOTO 61% functional intake at eval, goal is 69%   COGNITION:           Overall cognitive status: Within functional limits for tasks assessed                              LOWER EXTREMITY ROM:   Active ROM Left eval Left 11/17/21 Left 12/06/21 Left 01/10/22 Left 01/23/22  Hip flexion        Hip extension        Hip abduction        Hip adduction        Hip internal rotation        Hip external rotation        Knee flexion        Knee extension        Ankle dorsiflexion _0 Ankle plantarflexion WNL      Ankle inversion WNL      Ankle eversion _1 (Blank rows = not tested)   LOWER EXTREMITY  MMT:   MMT in sitting/supine Left eval Left 12/06/21 Left 01/10/22 Left 01/23/22  Hip flexion 3+ 4    Hip extension       Hip abduction 3 4    Hip adduction       Hip internal rotation       Hip external rotation       Knee flexion       Knee extension       Ankle dorsiflexion 4 4+    Ankle plantarflexion 4 4+    Ankle inversion 5 5    Ankle eversion 2+ 3- 3 3+   (Blank rows = not tested)   LOWER EXTREMITY SPECIAL TESTS:      FUNCTIONAL TESTS:  10/24/21: 5 times sit to stand: 11.74 seconds without UE support   GAIT: Distance walked: Information systems manager utilized: Single point cane Level of assistance: Modified independence Comments: foot drop noted on left with trendelenburg       TODAY'S TREATMENT: 02/14/22 -Nu step L6 seat # 12 X 8 min -Slantboard 30 sec X 3 -Heel raises eccentric up with both and down with left only 2X10 -Heel and toe raises X 15 bilat -Leg press DL 125#2X15, then SL each side 75# 2X15 -Single leg stance 15 sec X 5 bilat -SLS on foam pad with 2 cone toe taps X 10 bilat, min A needed for balance at times -Supine ankle circles X 15 each and alphabet X 1 with 3# around toes  --Manual therapy for skilled palpation and compression with Trigger Point Dry-Needling   Patient Consent Given: Yes Education handout provided: previously provided Muscles treated: left peroneal for muscle activation with Estim frequency 2 and intensity 3-4 to tolerance Treatment response/outcome: twitch response noted bilaterally  02/08/22 -Nu step L6 seat # 12 X 8 min -Slantboard 30 sec X 3 -Heel raises eccentric up with both and down with left only 2X10 -Heel and toe raises X 15 bilat -Leg press DL 125#2X15, then SL each side 75# 2X15 -Single leg stance 15 sec X 5 bilat -SLS on foam pad with 2 cone toe taps X 10 bilat, min A needed for balance at times -SLS with tennis ball rolls X 10 CW and CCW bilat -Supine ankle circles X 15 each and alphabet X 1  with 3# around toes  --Manual  therapy for skilled palpation and compression with Trigger Point Dry-Needling   Patient Consent Given: Yes Education handout provided: previously provided Muscles treated: left peroneal for muscle activation with Estim frequency 2 and intensity 3-4 to tolerance Treatment response/outcome: twitch response noted bilaterally    PATIENT EDUCATION:  Education details: exam findings and PT plan of care Person educated: Patient Education method: Explanation Education comprehension: verbalized understanding     HOME EXERCISE PROGRAM: Will review his HEP from last episode of care and revise PRN next session       ASSESSMENT:   CLINICAL IMPRESSION: His balance and gait was a little more unsteady today and he felt like he had less overall strength in his left ankle. We continued to work to improve this and he will continue to benefit form skilled PT to address his functional deficits.   OBJECTIVE IMPAIRMENTS: decreased activity tolerance, difficulty walking, decreased balance, decreased endurance, decreased mobility, decreased ROM, decreased strength, impaired flexibility, impaired LE use, postural dysfunction, and pain.   ACTIVITY LIMITATIONS: bending, lifting, carry, locomotion, cleaning, community activity, and or occupation   PERSONAL FACTORS: Lt peroneal nerve decompression, foot drop,2020 Colon Cancer, Neuropathy in feet, PVD, prostate cancer, hernia repair are also affecting patient's functional outcome.   REHAB POTENTIAL: Fair     CLINICAL DECISION MAKING: Stable/uncomplicated   EVALUATION COMPLEXITY: Low       GOALS: Short term PT Goals Target date: 11/21/2021 Pt will be I and compliant with HEP. Baseline:  Goal status: MET Pt will decrease pain by 25% overall with prolonged standing Baseline: Goal status:MET   Long term PT goals Target date: 03/27/2022 Pt will improve left ankle ROM to WFL (10 deg EV and DF) in supine to improve functional  mobility Baseline: Goal status: ongong 01/23/22 Pt will improve  hip/knee strength to at least 5-/5 MMT in sitting, and ankle EV strength to 4/5, ankle DF strength to 4+ to improve functional strength Baseline: Goal status: ongoing Pt will improve FOTO to at least 69% functional to show improved function Baseline: Goal status: Discontinued, incorrect body part/set up Pt will reduce pain by overall 50% overall with usual activity and prolonged standing Baseline: Goal status: MET 01/23/33    PLAN: PT FREQUENCY: 1-3 times per week    PT DURATION: 6-8 weeks   PLANNED INTERVENTIONS (unless contraindicated): aquatic PT, Canalith repositioning, cryotherapy, Electrical stimulation, Iontophoresis with 4 mg/ml dexamethasome, Moist heat, traction, Ultrasound, gait training, Therapeutic exercise, balance training, neuromuscular re-education, patient/family education, prosthetic training, manual techniques, passive ROM, dry needling, taping, vasopnuematic device, vestibular, spinal manipulations, joint manipulations   PLAN FOR NEXT SESSION: DF and EV strength, balance, general leg strength, DN with stim and or [place] for peroneal recuitment    Debbe Odea, PT,DPT 02/14/2022, 8:04 AM

## 2022-02-15 ENCOUNTER — Encounter: Payer: Self-pay | Admitting: Physical Therapy

## 2022-02-15 ENCOUNTER — Ambulatory Visit (INDEPENDENT_AMBULATORY_CARE_PROVIDER_SITE_OTHER): Payer: Medicare Other | Admitting: Physical Therapy

## 2022-02-15 DIAGNOSIS — M6281 Muscle weakness (generalized): Secondary | ICD-10-CM

## 2022-02-15 DIAGNOSIS — M25552 Pain in left hip: Secondary | ICD-10-CM | POA: Diagnosis not present

## 2022-02-15 DIAGNOSIS — M79662 Pain in left lower leg: Secondary | ICD-10-CM

## 2022-02-15 DIAGNOSIS — R262 Difficulty in walking, not elsewhere classified: Secondary | ICD-10-CM | POA: Diagnosis not present

## 2022-02-15 NOTE — Therapy (Signed)
OUTPATIENT PHYSICAL THERAPY TREATMENT NOTE     Patient Name: Oscar Sparks MRN: [id number] DOB:February 15, 1941, 81 y.o., male Today's Date: 02/15/2022   END OF SESSION:   PT End of Session - 02/15/22 0855     Visit Number 26    Number of Visits 30    Date for PT Re-Evaluation 03/27/22    Authorization Type MCR    Progress Note Due on Visit 4    PT Start Time 0842    PT Stop Time 0925    PT Time Calculation (min) 43 min    Activity Tolerance Patient tolerated treatment well    Behavior During Therapy [facility] for tasks assessed/performed             Past Medical History:  Diagnosis Date   Anemia    Arthritis    knees   Colon cancer ([place]) 09/24/2018   ED (erectile dysfunction)    Inguinal hernia    Neuromuscular disorder (HCC)    neuropathy in feet   Peripheral vascular disease ([place])    poor curculation in hands and feet hard to monitor with pulse oximetry   Prostate cancer ([place])    sees Dr. Gaynelle Arabian, received cesium seeds    Past Surgical History:  Procedure Laterality Date   BACK SURGERY     INGUINAL HERNIA REPAIR Right 05/20/2021   Procedure: OPEN RIGHT INGUINAL HERNIA REPAIR WITH MESH;  Surgeon: Jovita Kussmaul, MD;  Location: [place];  Service: General;  Laterality: Right;   SPINE SURGERY  2004   Dr. 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 Yes: NPRS scale: 0/10 Pain location: left hip Pain description: inconvience Aggravating factors: prolonged standing or walking Relieving factors: rest, NSAIDS   PRECAUTIONS: Fall   WEIGHT BEARING RESTRICTIONS No   FALLS:  Has patient fallen in last 6 months? No, but has had several near falls   OCCUPATION: retired   PLOF: Independent with basic ADLs   PATIENT GOALS improve walking and balance by reducing foot drop     OBJECTIVE:    DIAGNOSTIC FINDINGS: nothing  recent   PATIENT SURVEYS:  FOTO 61% functional intake at eval, goal is 69%   COGNITION:           Overall cognitive status: Within functional limits for tasks assessed                              LOWER EXTREMITY ROM:   Active ROM Left eval Left 11/17/21 Left 12/06/21 Left 01/10/22 Left 01/23/22  Hip flexion        Hip extension        Hip abduction        Hip adduction        Hip internal rotation        Hip external rotation        Knee flexion        Knee extension        Ankle dorsiflexion _0 Ankle plantarflexion WNL      Ankle inversion WNL      Ankle eversion _1 (Blank rows = not tested)   LOWER  EXTREMITY MMT:   MMT in sitting/supine Left eval Left 12/06/21 Left 01/10/22 Left 01/23/22  Hip flexion 3+ 4    Hip extension       Hip abduction 3 4    Hip adduction       Hip internal rotation       Hip external rotation       Knee flexion       Knee extension       Ankle dorsiflexion 4 4+    Ankle plantarflexion 4 4+    Ankle inversion 5 5    Ankle eversion 2+ 3- 3 3+   (Blank rows = not tested)   LOWER EXTREMITY SPECIAL TESTS:      FUNCTIONAL TESTS:  10/24/21: 5 times sit to stand: 11.74 seconds without UE support   GAIT: Distance walked: Information systems manager utilized: Single point cane Level of assistance: Modified independence Comments: foot drop noted on left with trendelenburg       TODAY'S TREATMENT: 02/15/22 -Nu step L6 seat # 12 X 8 min -Slantboard 30 sec X 3 -Heel raises eccentric up with both and down with left only 2X10 -Heel and toe raises X 15 bilat -Leg press DL 125#2X15, then SL each side 75# 2X15 -tandem stance 30 sec X 2 bilat -SLS with tennis ball rolls X 10 CW and CCW bilat -Supine ankle circles X 15 each and alphabet X 1 with 3# around toes -Supine ankle DF, PF, EV with red X 15 each  -E-stim russian/NMES for motor recruitment of peroneals/Tibialis anterior 5/5 sec on/off with intensity to tolerance and active EV/DF contraction/AROM for 5 sec hold during on phase in sidelying X 10 total minutes   02/14/22 -Nu step L6 seat # 12 X 8 min -Slantboard 30 sec X 3 -Heel raises eccentric up with both and down with left only 2X10 -Heel and toe raises X 15 bilat -Leg press DL 125#2X15, then SL each side 75# 2X15 -Single leg stance 15 sec X 5 bilat -SLS on foam pad with 2 cone toe taps X 10 bilat, min A needed for balance at times -Supine ankle circles X 15 each and alphabet X 1 with 3# around toes  --Manual therapy for skilled palpation and compression with Trigger Point Dry-Needling   Patient Consent Given: Yes Education handout  provided: previously provided Muscles treated: left peroneal for muscle activation with Estim frequency 2 and intensity 3-4 to tolerance Treatment response/outcome: twitch response noted bilaterally    PATIENT EDUCATION:  Education details: exam findings and PT plan of care Person educated: Patient Education method: Explanation Education comprehension: verbalized understanding     HOME EXERCISE PROGRAM: Will review his HEP from last episode of care and revise PRN next session       ASSESSMENT:   CLINICAL IMPRESSION: His balance, gait, and peroneal activation had improved from last time in his left ankle. This is still overall decreased from normal limits so We continued to work to improve this with strengthening, balance, and NMES stimulation for recuitment of pernoneals  OBJECTIVE IMPAIRMENTS: decreased activity tolerance, difficulty walking, decreased balance, decreased endurance, decreased mobility, decreased ROM, decreased strength, impaired flexibility, impaired LE use, postural dysfunction, and pain.   ACTIVITY LIMITATIONS: bending, lifting, carry, locomotion, cleaning, community activity, and or occupation   PERSONAL FACTORS: Lt peroneal nerve decompression, foot drop,2020 Colon Cancer, Neuropathy in feet, PVD, prostate cancer, hernia repair are also affecting patient's functional outcome.   REHAB POTENTIAL: Fair     CLINICAL DECISION MAKING: Stable/uncomplicated   EVALUATION COMPLEXITY: Low       GOALS: Short term PT Goals Target date: 11/21/2021 Pt will be I and compliant with HEP. Baseline:  Goal status: MET Pt will decrease pain by 25% overall with prolonged standing Baseline: Goal status:MET   Long term PT goals Target date: 03/27/2022 Pt will improve left ankle ROM to WFL (10 deg EV and DF) in supine to improve functional mobility Baseline: Goal status: ongong 01/23/22 Pt will improve  hip/knee strength to at least 5-/5 MMT in sitting, and ankle EV strength  to 4/5, ankle DF strength to 4+ to improve functional strength Baseline: Goal status: ongoing Pt will improve FOTO to at least 69% functional to show improved function Baseline: Goal status: Discontinued, incorrect body part/set up Pt will reduce pain by overall 50% overall with usual activity and prolonged standing Baseline: Goal status: MET 01/23/33    PLAN: PT FREQUENCY: 1-3 times per week    PT DURATION: 6-8 weeks   PLANNED INTERVENTIONS (unless contraindicated): aquatic PT, Canalith repositioning, cryotherapy, Electrical stimulation, Iontophoresis with 4 mg/ml dexamethasome, Moist heat, traction, Ultrasound, gait training, Therapeutic exercise, balance training, neuromuscular re-education, patient/family education, prosthetic training, manual techniques, passive ROM, dry needling, taping, vasopnuematic device, vestibular, spinal manipulations, joint manipulations   PLAN FOR NEXT SESSION: DF and EV strength, balance, general leg strength, DN with stim and or [place] for peroneal recuitment    Debbe Odea, PT,DPT 02/15/2022, 9:03 AM

## 2022-02-20 ENCOUNTER — Encounter: Payer: Self-pay | Admitting: Physical Therapy

## 2022-02-20 ENCOUNTER — Ambulatory Visit (INDEPENDENT_AMBULATORY_CARE_PROVIDER_SITE_OTHER): Payer: Medicare Other | Admitting: Physical Therapy

## 2022-02-20 DIAGNOSIS — M6281 Muscle weakness (generalized): Secondary | ICD-10-CM

## 2022-02-20 DIAGNOSIS — M25552 Pain in left hip: Secondary | ICD-10-CM

## 2022-02-20 DIAGNOSIS — M79662 Pain in left lower leg: Secondary | ICD-10-CM | POA: Diagnosis not present

## 2022-02-20 DIAGNOSIS — R262 Difficulty in walking, not elsewhere classified: Secondary | ICD-10-CM | POA: Diagnosis not present

## 2022-02-20 NOTE — Therapy (Signed)
OUTPATIENT PHYSICAL THERAPY TREATMENT NOTE     Patient Name: Oscar Sparks MRN: [id number] DOB:[birth year], 81 y.o., male Today's Date: 02/20/2022   END OF SESSION:   PT End of Session - 02/20/22 0807     Visit Number 27    Number of Visits 30    Date for PT Re-Evaluation 03/27/22    Authorization Type MCR    Progress Note Due on Visit 41    PT Start Time 0800    PT Stop Time 0844    PT Time Calculation (min) 44 min    Activity Tolerance Patient tolerated treatment well    Behavior During Therapy [facility] for tasks assessed/performed             Past Medical History:  Diagnosis Date   Anemia    Arthritis    knees   Colon cancer ([place]) 09/24/2018   ED (erectile dysfunction)    Inguinal hernia    Neuromuscular disorder (HCC)    neuropathy in feet   Peripheral vascular disease ([place])    poor curculation in hands and feet hard to monitor with pulse oximetry   Prostate cancer ([place])    sees Dr. Gaynelle Arabian, received cesium seeds    Past Surgical History:  Procedure Laterality Date   BACK SURGERY     INGUINAL HERNIA REPAIR Right 05/20/2021   Procedure: OPEN RIGHT INGUINAL HERNIA REPAIR WITH MESH;  Surgeon: Jovita Kussmaul, MD;  Location: [place];  Service: General;  Laterality: Right;   SPINE SURGERY  2004   Dr. 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 Yes: NPRS scale: 0/10 Pain location: left hip Pain description: inconvience Aggravating factors: prolonged standing or walking Relieving factors: rest, NSAIDS   PRECAUTIONS: Fall   WEIGHT BEARING RESTRICTIONS No   FALLS:  Has patient fallen in last 6 months? No, but has had several near falls   OCCUPATION: retired   PLOF: Independent with basic ADLs   PATIENT GOALS improve walking and balance by reducing foot drop     OBJECTIVE:    DIAGNOSTIC FINDINGS: nothing  recent   PATIENT SURVEYS:  FOTO 61% functional intake at eval, goal is 69%   COGNITION:           Overall cognitive status: Within functional limits for tasks assessed                              LOWER EXTREMITY ROM:   Active ROM Left eval Left 11/17/21 Left 12/06/21 Left 01/10/22 Left 01/23/22  Hip flexion        Hip extension        Hip abduction        Hip adduction        Hip internal rotation        Hip external rotation        Knee flexion        Knee extension        Ankle dorsiflexion _0 Ankle plantarflexion WNL      Ankle inversion WNL      Ankle eversion _1 (Blank rows = not tested)   LOWER  EXTREMITY MMT:   MMT in sitting/supine Left eval Left 12/06/21 Left 01/10/22 Left 01/23/22 Left 02/20/22  Hip flexion 3+ 4     Hip extension        Hip abduction 3 4     Hip adduction        Hip internal rotation        Hip external rotation        Knee flexion        Knee extension        Ankle dorsiflexion 4 4+     Ankle plantarflexion 4 4+     Ankle inversion 5 5     Ankle eversion 2+ 3- 3 3+ 3+   (Blank rows = not tested)   LOWER EXTREMITY SPECIAL TESTS:      FUNCTIONAL TESTS:  10/24/21: 5 times sit to stand: 11.74 seconds without UE support   GAIT: Distance walked: Information systems manager utilized: Single point cane Level of assistance: Modified independence Comments: foot drop noted on left with trendelenburg       TODAY'S TREATMENT: 02/20/22 -Bike L3 x 8 min -Slantboard 30 sec X 3 -Heel raises eccentric up with both and down with left only 2X10 -Heel and toe raises X 15 bilat -tandem stance 20 sec X 2 bilat -Leg press DL 125#2X15, then SL each side 75# 2X15 -Single leg stance 15 sec X 5 bilat -Supine ankle DF, PF, EV with red X 15 each -Supine ankle circles X 15 each and alphabet X 1 with 3# around toes  --Manual therapy for skilled palpation and compression with Trigger Point Dry-Needling   Patient Consent Given: Yes Education handout provided: previously provided Muscles treated: left peroneal for muscle activation with Estim frequency 2 and intensity 3-4 to tolerance Treatment response/outcome: twitch response noted bilaterally  02/15/22 -Nu step L6 seat # 12 X 8 min -Slantboard 30 sec X 3 -Heel raises eccentric up with both and down with left only 2X10 -Heel and toe raises X 15 bilat -Leg press DL 125#2X15, then SL each side 75# 2X15 -tandem stance 30 sec X 2 bilat -SLS with tennis ball rolls X 10 CW and CCW bilat -Supine ankle circles X 15 each and alphabet X 1 with 3# around toes -Supine ankle DF, PF, EV with red X 15  each  -  E-stim russian/NMES for motor recruitment of peroneals/Tibialis anterior 5/5 sec on/off with intensity to tolerance and active EV/DF contraction/AROM for 5 sec hold during on phase in sidelying X 10 total minutes     PATIENT EDUCATION:  Education details: exam findings and PT plan of care Person educated: Patient Education method: Explanation Education comprehension: verbalized understanding     HOME EXERCISE PROGRAM: Will review his HEP from last episode of care and revise PRN next session       ASSESSMENT:   CLINICAL IMPRESSION: He had more difficulty with single leg balance again today. Peroneal activation was overall fair today. PT recommending to continue with current PT plan.  OBJECTIVE IMPAIRMENTS: decreased activity tolerance, difficulty walking, decreased balance, decreased endurance, decreased mobility, decreased ROM, decreased strength, impaired flexibility, impaired LE use, postural dysfunction, and pain.   ACTIVITY LIMITATIONS: bending, lifting, carry, locomotion, cleaning, community activity, and or occupation   PERSONAL FACTORS: Lt peroneal nerve decompression, foot drop,2020 Colon Cancer, Neuropathy in feet, PVD, prostate cancer, hernia repair are also affecting patient's functional outcome.   REHAB POTENTIAL: Fair     CLINICAL DECISION MAKING: Stable/uncomplicated   EVALUATION COMPLEXITY: Low       GOALS: Short term PT Goals Target date: 11/21/2021 Pt will be I and compliant with HEP. Baseline:  Goal status: MET Pt will decrease pain by 25% overall with prolonged standing Baseline: Goal status:MET   Long term PT goals Target date: 03/27/2022 Pt will improve left ankle ROM to WFL (10 deg EV and DF) in supine to improve functional mobility Baseline: Goal status: ongong 01/23/22 Pt will improve  hip/knee strength to at least 5-/5 MMT in sitting, and ankle EV strength to 4/5, ankle DF strength to 4+ to improve functional strength Baseline: Goal  status: ongoing Pt will improve FOTO to at least 69% functional to show improved function Baseline: Goal status: Discontinued, incorrect body part/set up Pt will reduce pain by overall 50% overall with usual activity and prolonged standing Baseline: Goal status: MET 01/23/33    PLAN: PT FREQUENCY: 1-3 times per week    PT DURATION: 6-8 weeks   PLANNED INTERVENTIONS (unless contraindicated): aquatic PT, Canalith repositioning, cryotherapy, Electrical stimulation, Iontophoresis with 4 mg/ml dexamethasome, Moist heat, traction, Ultrasound, gait training, Therapeutic exercise, balance training, neuromuscular re-education, patient/family education, prosthetic training, manual techniques, passive ROM, dry needling, taping, vasopnuematic device, vestibular, spinal manipulations, joint manipulations   PLAN FOR NEXT SESSION: DF and EV strength, balance, general leg strength, DN with stim and or [place] for peroneal recuitment    Debbe Odea, PT,DPT 02/20/2022, 8:40 AM

## 2022-02-22 ENCOUNTER — Ambulatory Visit (INDEPENDENT_AMBULATORY_CARE_PROVIDER_SITE_OTHER): Payer: Medicare Other | Admitting: Physical Therapy

## 2022-02-22 DIAGNOSIS — M79662 Pain in left lower leg: Secondary | ICD-10-CM | POA: Diagnosis not present

## 2022-02-22 DIAGNOSIS — M25552 Pain in left hip: Secondary | ICD-10-CM | POA: Diagnosis not present

## 2022-02-22 DIAGNOSIS — M6281 Muscle weakness (generalized): Secondary | ICD-10-CM | POA: Diagnosis not present

## 2022-02-22 DIAGNOSIS — R262 Difficulty in walking, not elsewhere classified: Secondary | ICD-10-CM

## 2022-02-22 NOTE — Therapy (Signed)
OUTPATIENT PHYSICAL THERAPY TREATMENT NOTE     Patient Name: Oscar Sparks MRN: [id number] DOB:[birth year], 81 y.o., male Today's Date: 02/22/2022   END OF SESSION:   PT End of Session - 02/22/22 0810     Visit Number 28    Number of Visits 30    Date for PT Re-Evaluation 03/27/22    Authorization Type MCR    Progress Note Due on Visit 38    PT Start Time 0800    PT Stop Time 0845    PT Time Calculation (min) 45 min    Activity Tolerance Patient tolerated treatment well    Behavior During Therapy [facility] for tasks assessed/performed             Past Medical History:  Diagnosis Date   Anemia    Arthritis    knees   Colon cancer ([place]) 09/24/2018   ED (erectile dysfunction)    Inguinal hernia    Neuromuscular disorder (HCC)    neuropathy in feet   Peripheral vascular disease ([place])    poor curculation in hands and feet hard to monitor with pulse oximetry   Prostate cancer ([place])    sees Dr. Gaynelle Arabian, received cesium seeds    Past Surgical History:  Procedure Laterality Date   BACK SURGERY     INGUINAL HERNIA REPAIR Right 05/20/2021   Procedure: OPEN RIGHT INGUINAL HERNIA REPAIR WITH MESH;  Surgeon: Jovita Kussmaul, MD;  Location: [place];  Service: General;  Laterality: Right;   SPINE SURGERY  2004   Dr. 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 Yes: NPRS scale: 0/10 Pain location: left hip Pain description: inconvience Aggravating factors: prolonged standing or walking Relieving factors: rest, NSAIDS   PRECAUTIONS: Fall   WEIGHT BEARING RESTRICTIONS No   FALLS:  Has patient fallen in last 6 months? No, but has had several near falls   OCCUPATION: retired   PLOF: Independent with basic ADLs   PATIENT GOALS improve walking and balance by reducing foot drop     OBJECTIVE:    DIAGNOSTIC FINDINGS: nothing recent   PATIENT SURVEYS:  FOTO 61% functional  intake at eval, goal is 69%   COGNITION:           Overall cognitive status: Within functional limits for tasks assessed                              LOWER EXTREMITY ROM:   Active ROM Left eval Left 11/17/21 Left 12/06/21 Left 01/10/22 Left 01/23/22  Hip flexion        Hip extension        Hip abduction        Hip adduction        Hip internal rotation        Hip external rotation        Knee flexion        Knee extension        Ankle dorsiflexion _0 Ankle plantarflexion WNL      Ankle inversion WNL      Ankle eversion _1 (Blank rows = not tested)   LOWER EXTREMITY MMT:   MMT in sitting/supine  Left eval Left 12/06/21 Left 01/10/22 Left 01/23/22 Left 02/20/22  Hip flexion 3+ 4     Hip extension        Hip abduction 3 4     Hip adduction        Hip internal rotation        Hip external rotation        Knee flexion        Knee extension        Ankle dorsiflexion 4 4+     Ankle plantarflexion 4 4+     Ankle inversion 5 5     Ankle eversion 2+ 3- 3 3+ 3+   (Blank rows = not tested)   LOWER EXTREMITY SPECIAL TESTS:      FUNCTIONAL TESTS:  10/24/21: 5 times sit to stand: 11.74 seconds without UE support   GAIT: Distance walked: Information systems manager utilized: Single point cane Level of assistance: Modified independence Comments: foot drop noted on left with trendelenburg       TODAY'S TREATMENT: 02/20/22 -Bike L3 x 8 min -Slantboard 30 sec X 3 -Heel raises eccentric up with both and down with left only 2X10 -Heel and toe raises X 15 bilat -tandem stance 20 sec X 2 bilat -Chair squats with 10# 2X10 -Single leg stance 10 sec X 5 bilat -sidestepping on foam and tandem walk on foam 5 round trips in bars -Rocker  -Supine ankle DF, PF, EV with red X 15 each  -E-stim russian/NMES for motor recruitment of peroneals/Tibialis anterior 5/5 sec on/off with intensity to tolerance and active EV/DF contraction/AROM for 5 sec hold during on phase in sidelying X 10 total minutes   02/15/22 -Nu step L6 seat # 12 X 8 min -Slantboard 30 sec X 3 -Heel raises eccentric up with both and down with left only 2X10 -Heel and toe raises X 15 bilat -Leg press DL 125#2X15, then SL each side 75# 2X15 -tandem stance 30 sec X 2 bilat -SLS with tennis ball rolls X 10 CW and CCW bilat -Supine ankle circles X 15 each and alphabet X 1 with 3# around toes -Supine ankle DF, PF, EV with red X 15 each  -E-stim russian/NMES for motor recruitment of peroneals/Tibialis anterior 5/5 sec on/off with intensity to tolerance and active EV/DF contraction/AROM for 5 sec hold during on  phase  in sidelying X 10 total minutes     PATIENT EDUCATION:  Education details: exam findings and PT plan of care Person educated: Patient Education method: Explanation Education comprehension: verbalized understanding     HOME EXERCISE PROGRAM: Will review his HEP from last episode of care and revise PRN next session       ASSESSMENT:   CLINICAL IMPRESSION: We worked on more dynamic balance challenges and balance on foam surface for ankle muscle activation and proprioception. Used NMES Estim today to also assist with peroneal activation.  PT recommending to continue with current PT plan.  OBJECTIVE IMPAIRMENTS: decreased activity tolerance, difficulty walking, decreased balance, decreased endurance, decreased mobility, decreased ROM, decreased strength, impaired flexibility, impaired LE use, postural dysfunction, and pain.   ACTIVITY LIMITATIONS: bending, lifting, carry, locomotion, cleaning, community activity, and or occupation   PERSONAL FACTORS: Lt peroneal nerve decompression, foot drop,2020 Colon Cancer, Neuropathy in feet, PVD, prostate cancer, hernia repair are also affecting patient's functional outcome.   REHAB POTENTIAL: Fair     CLINICAL DECISION MAKING: Stable/uncomplicated   EVALUATION COMPLEXITY: Low       GOALS: Short term PT Goals Target date: 11/21/2021 Pt will be I and compliant with HEP. Baseline:  Goal status: MET Pt will decrease pain by 25% overall with prolonged standing Baseline: Goal status:MET   Long term PT goals Target date: 03/27/2022 Pt will improve left ankle ROM to WFL (10 deg EV and DF) in supine to improve functional mobility Baseline: Goal status: ongong 01/23/22 Pt will improve  hip/knee strength to at least 5-/5 MMT in sitting, and ankle EV strength to 4/5, ankle DF strength to 4+ to improve functional strength Baseline: Goal status: ongoing Pt will improve FOTO to at least 69% functional to show improved function Baseline: Goal  status: Discontinued, incorrect body part/set up Pt will reduce pain by overall 50% overall with usual activity and prolonged standing Baseline: Goal status: MET 01/23/33    PLAN: PT FREQUENCY: 1-3 times per week    PT DURATION: 6-8 weeks   PLANNED INTERVENTIONS (unless contraindicated): aquatic PT, Canalith repositioning, cryotherapy, Electrical stimulation, Iontophoresis with 4 mg/ml dexamethasome, Moist heat, traction, Ultrasound, gait training, Therapeutic exercise, balance training, neuromuscular re-education, patient/family education, prosthetic training, manual techniques, passive ROM, dry needling, taping, vasopnuematic device, vestibular, spinal manipulations, joint manipulations   PLAN FOR NEXT SESSION: DF and EV strength, balance, general leg strength, DN with stim and or [place] for peroneal recuitment    Debbe Odea, PT,DPT 02/22/2022, 8:11 AM

## 2022-02-28 ENCOUNTER — Ambulatory Visit (INDEPENDENT_AMBULATORY_CARE_PROVIDER_SITE_OTHER): Payer: Medicare Other | Admitting: Physical Therapy

## 2022-02-28 ENCOUNTER — Encounter: Payer: Self-pay | Admitting: Physical Therapy

## 2022-02-28 DIAGNOSIS — M79662 Pain in left lower leg: Secondary | ICD-10-CM

## 2022-02-28 DIAGNOSIS — M6281 Muscle weakness (generalized): Secondary | ICD-10-CM

## 2022-02-28 DIAGNOSIS — M25552 Pain in left hip: Secondary | ICD-10-CM

## 2022-02-28 DIAGNOSIS — R262 Difficulty in walking, not elsewhere classified: Secondary | ICD-10-CM

## 2022-02-28 NOTE — Addendum Note (Signed)
Addended by: Debbe Odea on: 02/28/2022 08:52 AM   Modules accepted: Orders

## 2022-02-28 NOTE — Therapy (Addendum)
OUTPATIENT PHYSICAL THERAPY TREATMENT NOTE/RECERT Progress Note reporting period 01/23/22 to 02/28/22  See below for objective and subjective measurements relating to patients progress with PT.      Patient Name: Oscar Sparks MRN: [id number] DOB:Mar 02, 1941, 81 y.o., male Today's Date: 02/28/2022   END OF SESSION:   PT End of Session - 02/28/22 0817     Visit Number 29    Number of Visits 45    Date for PT Re-Evaluation 05/23/22    Authorization Type MCR    Progress Note Due on Visit 53    PT Start Time 0800    PT Stop Time 0845    PT Time Calculation (min) 45 min    Activity Tolerance Patient tolerated treatment well    Behavior During Therapy [facility] for tasks assessed/performed             Past Medical History:  Diagnosis Date   Anemia    Arthritis    knees   Colon cancer ([place]) 09/24/2018   ED (erectile dysfunction)    Inguinal hernia    Neuromuscular disorder (HCC)    neuropathy in feet   Peripheral vascular disease ([place])    poor curculation in hands and feet hard to monitor with pulse oximetry   Prostate cancer ([place])    sees Dr. Gaynelle Arabian, received cesium seeds    Past Surgical History:  Procedure Laterality Date   BACK SURGERY     INGUINAL HERNIA REPAIR Right 05/20/2021   Procedure: OPEN RIGHT INGUINAL HERNIA REPAIR WITH MESH;  Surgeon: Jovita Kussmaul, MD;  Location: [place];  Service: General;  Laterality: Right;   SPINE SURGERY  2004   Dr. 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 He can tell he is improving but is not at the level he wants to be and wants to extend his PT. He is able to craw on the floor some now due to better ankle movement.   PERTINENT HISTORY: Lt peroneal nerve decompression, foot drop,2020 Colon Cancer, Neuropathy in feet, PVD, prostate cancer, hernia repair   PAIN:  Are you having pain? Yes: NPRS scale: 0/10 Pain location: left hip Pain description: inconvience Aggravating factors: prolonged standing or walking Relieving factors: rest, NSAIDS   PRECAUTIONS: Fall   WEIGHT BEARING RESTRICTIONS No  FALLS:  Has patient fallen in last 6 months? No, but has had several near falls   OCCUPATION: retired   PLOF: Independent with basic ADLs   PATIENT GOALS improve walking and balance by reducing foot drop     OBJECTIVE:    DIAGNOSTIC FINDINGS: nothing recent   PATIENT SURVEYS:  FOTO 61% functional intake at eval, goal is 69%   COGNITION:           Overall cognitive status: Within functional limits for tasks assessed                              LOWER EXTREMITY ROM:   Active ROM Left eval Left 11/17/21 Left 12/06/21 Left 01/10/22 Left 01/23/22 Left 02/28/22  Hip flexion         Hip extension         Hip  abduction         Hip adduction         Hip internal rotation         Hip external rotation         Knee flexion         Knee extension         Ankle dorsiflexion _0 Ankle plantarflexion WNL     WNL  Ankle inversion WNL     WNL  Ankle eversion _1 (Blank rows = not tested)   LOWER EXTREMITY MMT:   MMT in sitting/supine Left eval Left 12/06/21 Left 01/10/22 Left 01/23/22 Left 02/20/22  Hip flexion 3+ 4     Hip extension        Hip abduction 3 4     Hip adduction        Hip internal rotation        Hip external rotation        Knee flexion        Knee extension        Ankle dorsiflexion 4 4+   4+  Ankle plantarflexion 4 4+   4+  Ankle inversion 5 5     Ankle eversion 2+ 3- 3 3+ 3+   (Blank rows = not tested)   LOWER EXTREMITY SPECIAL TESTS:      FUNCTIONAL TESTS:  10/24/21: 5 times sit to stand: 11.74 seconds without UE support   GAIT: Distance walked: Buyer, retail device utilized: Single point cane Level of assistance: Modified independence Comments: foot drop noted on left with trendelenburg       TODAY'S TREATMENT: 02/28/22 -Recumbent bike L3 X 8 min -Slantboard 30 sec X 3 -Eccentric heel raises on left 2X10 -Heel and toe raises 20 bilat -Tandem balance 30 sec X 2 bilat -SLS balance 10 sec X 5 bilat -Leg press DL 125# 2X15, SL 75# 2X15 -ankle circles X 15 each way and ankle aplhabet with 3# around foot in supine  --Manual therapy for skilled palpation and compression with Trigger Point Dry-Needling   Patient Consent Given: Yes Education handout provided: previously provided Muscles treated: left peroneal for muscle activation with Estim frequency 2 and intensity 3-4 to tolerance Treatment response/outcome: twitch response noted bilaterally  02/20/22 -Bike L3 x 8 min -Slantboard 30 sec X 3 -Heel raises eccentric up with both and down with left only 2X10 -Heel and toe raises X 15 bilat -tandem stance 20 sec X  2 bilat -Chair squats with 10#  2X10 -Single leg stance 10 sec X 5 bilat -sidestepping on foam and tandem walk on foam 5 round trips in bars -Rocker  -Supine ankle DF, PF, EV with red X 15 each  -E-stim russian/NMES for motor recruitment of peroneals/Tibialis anterior 5/5 sec on/off with intensity to tolerance and active EV/DF contraction/AROM for 5 sec hold during on phase in sidelying X 10 total minutes      PATIENT EDUCATION:  Education details: exam findings and PT plan of care Person educated: Patient Education method: Explanation Education comprehension: verbalized understanding     HOME EXERCISE PROGRAM: Will review his HEP from last episode of care and revise PRN next session       ASSESSMENT:   CLINICAL IMPRESSION: Recert today as his PT plan expires next visit. Due to some subjective and objective improvement's I do recommend to extend his PT as he continues to be functionally limited by left ankle drop foot due to EV and DF weakness. He has not yet met his PT goals. His DF has improved a lot but his EV has only improved mildly. We will continue to work to maximize this in order to reduce his risk of falling. PT remains medically necessary for this patient.   OBJECTIVE IMPAIRMENTS: decreased activity tolerance, difficulty walking, decreased balance, decreased endurance, decreased mobility, decreased ROM, decreased strength, impaired flexibility, impaired LE use, postural dysfunction, and pain.   ACTIVITY LIMITATIONS: bending, lifting, carry, locomotion, cleaning, community activity, and or occupation   PERSONAL FACTORS: Lt peroneal nerve decompression, foot drop,2020 Colon Cancer, Neuropathy in feet, PVD, prostate cancer, hernia repair are also affecting patient's functional outcome.   REHAB POTENTIAL: Fair     CLINICAL DECISION MAKING: Stable/uncomplicated   EVALUATION COMPLEXITY: Low       GOALS: Short term PT Goals Target date: 03/31/21 Pt will be I and compliant  with HEP. Baseline:  Goal status: MET Pt will decrease pain by 25% overall with prolonged standing Baseline: Goal status:MET   Long term PT goals Target date: 05/23/21 Pt will improve left ankle ROM to WFL (10 deg EV and DF) in supine to improve functional mobility Baseline: Goal status: ongong 02/28/22 Pt will improve  hip/knee strength to at least 5-/5 MMT in sitting, and ankle EV strength to 4/5, ankle DF strength to 4+ to improve functional strength Baseline: Goal status: ongoing 02/28/22 Pt will improve FOTO to at least 69% functional to show improved function Baseline: Goal status: Discontinued, incorrect body part/set up Pt will reduce pain by overall 50% overall with usual activity and prolonged standing Baseline: Goal status: ongoing 02/28/22    PLAN: PT FREQUENCY: 1-2 times per week    PT DURATION: 8-12 weeks   PLANNED INTERVENTIONS (unless contraindicated): aquatic PT, Canalith repositioning, cryotherapy, Electrical stimulation, Iontophoresis with 4 mg/ml dexamethasome, Moist heat, traction, Ultrasound, gait training, Therapeutic exercise, balance training, neuromuscular re-education, patient/family education, prosthetic training, manual techniques, passive ROM, dry needling, taping, vasopnuematic device, vestibular, spinal manipulations, joint manipulations   PLAN FOR NEXT SESSION: DF and EV strength, balance, general leg strength, DN with stim and or [place] for peroneal recuitment    Debbe Odea, PT,DPT 02/28/2022, 8:31 AM

## 2022-03-02 ENCOUNTER — Encounter: Payer: Self-pay | Admitting: Physical Therapy

## 2022-03-02 ENCOUNTER — Ambulatory Visit (INDEPENDENT_AMBULATORY_CARE_PROVIDER_SITE_OTHER): Payer: Medicare Other | Admitting: Physical Therapy

## 2022-03-02 DIAGNOSIS — R262 Difficulty in walking, not elsewhere classified: Secondary | ICD-10-CM

## 2022-03-02 DIAGNOSIS — M25552 Pain in left hip: Secondary | ICD-10-CM

## 2022-03-02 DIAGNOSIS — M6281 Muscle weakness (generalized): Secondary | ICD-10-CM | POA: Diagnosis not present

## 2022-03-02 DIAGNOSIS — M79662 Pain in left lower leg: Secondary | ICD-10-CM

## 2022-03-02 NOTE — Therapy (Signed)
OUTPATIENT PHYSICAL THERAPY TREATMENT     Patient Name: Oscar Sparks MRN: [id number] DOB:1940-09-03, 81 y.o., male Today's Date: 03/02/2022   END OF SESSION:   PT End of Session - 03/02/22 0758     Visit Number 30    Number of Visits 45    Date for PT Re-Evaluation 05/23/22    Authorization Type MCR    Progress Note Due on Visit 60    PT Start Time 0800    PT Stop Time 0845    PT Time Calculation (min) 45 min    Activity Tolerance Patient tolerated treatment well    Behavior During Therapy [facility] for tasks assessed/performed             Past Medical History:  Diagnosis Date   Anemia    Arthritis    knees   Colon cancer ([place]) 09/24/2018   ED (erectile dysfunction)    Inguinal hernia    Neuromuscular disorder (HCC)    neuropathy in feet   Peripheral vascular disease ([place])    poor curculation in hands and feet hard to monitor with pulse oximetry   Prostate cancer ([place])    sees Dr. Gaynelle Arabian, received cesium seeds    Past Surgical History:  Procedure Laterality Date   BACK SURGERY     INGUINAL HERNIA REPAIR Right 05/20/2021   Procedure: OPEN RIGHT INGUINAL HERNIA REPAIR WITH MESH;  Surgeon: Jovita Kussmaul, MD;  Location: [place];  Service: General;  Laterality: Right;   SPINE SURGERY  2004   Dr. 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 He can tell he is improving but is not at the level he wants to be and wants to extend his PT. He is able to craw on the floor some now due to better ankle movement.   PERTINENT HISTORY: Lt peroneal nerve decompression, foot drop,2020 Colon Cancer, Neuropathy in feet, PVD, prostate cancer, hernia repair   PAIN:  Are you having pain? Yes: NPRS scale: 0/10 Pain location: left hip Pain description: inconvience Aggravating factors: prolonged standing or walking Relieving factors: rest, NSAIDS   PRECAUTIONS: Fall   WEIGHT BEARING RESTRICTIONS No   FALLS:  Has patient fallen in last 6 months? No, but has had several near falls   OCCUPATION: retired  PLOF: Independent with basic ADLs   PATIENT GOALS improve walking and balance by reducing foot drop     OBJECTIVE:    DIAGNOSTIC FINDINGS: nothing recent   PATIENT SURVEYS:  FOTO 61% functional intake at eval, goal is 69%   COGNITION:           Overall cognitive status: Within functional limits for tasks assessed                              LOWER EXTREMITY ROM:   Active ROM Left eval Left 11/17/21 Left 12/06/21 Left 01/10/22 Left 01/23/22 Left 02/28/22  Hip flexion         Hip extension         Hip abduction         Hip adduction         Hip internal rotation         Hip external rotation         Knee flexion         Knee extension         Ankle dorsiflexion _0 Ankle plantarflexion WNL     WNL  Ankle inversion WNL     WNL  Ankle eversion _1 (Blank rows = not tested)   LOWER EXTREMITY MMT:   MMT in sitting/supine Left eval Left 12/06/21 Left 01/10/22 Left 01/23/22 Left 02/20/22  Hip flexion 3+ 4     Hip extension        Hip abduction 3 4     Hip adduction        Hip internal rotation        Hip external rotation        Knee flexion        Knee extension        Ankle dorsiflexion 4 4+   4+  Ankle plantarflexion 4 4+   4+  Ankle inversion 5 5     Ankle eversion 2+ 3- 3 3+ 3+   (Blank rows = not tested)   LOWER EXTREMITY SPECIAL TESTS:      FUNCTIONAL TESTS:  10/24/21: 5 times sit to stand: 11.74 seconds without UE support   GAIT: Distance walked: Buyer, retail device utilized: Single point cane Level of assistance: Modified independence Comments: foot drop noted on left with trendelenburg       TODAY'S TREATMENT: 03/02/22 -Bike L3 x 8 min -Slantboard 30 sec X 3 -Heel raises eccentric up with both and down with left only 2X10 -Heel and toe raises X 15 bilat -tandem stance 20 sec X 2 bilat -Single leg heel taps for balance tapping yoga block X 15 bilat -sidestepping on foam and tandem walk on foam 5 round trips in bars -Rocker board 1.5 min A-P, 1.5 min lateral -Supine ankle DF, PF, EV with red X 15 each -Supine ankle DF,EV,INV red  X15  -E-stim russian/NMES for motor recruitment of peroneals/Tibialis anterior 5/5 sec on/off with intensity to tolerance and active EV/DF contraction/AROM for 5 sec hold during on phase in sidelying X 10 total minutes   02/28/22 -Recumbent bike L3 X 8 min -Slantboard 30 sec X 3 -Eccentric heel raises on left 2X10 -Heel and toe raises 20 bilat -Tandem balance 30 sec X 2 bilat -SLS balance 10 sec X 5 bilat -Leg press DL 125# 2X15, SL 75# 2X15 -ankle circles X 15  each way and ankle aplhabet with 3# around foot in supine  --Manual therapy for  skilled palpation and compression with Trigger Point Dry-Needling   Patient Consent Given: Yes Education handout provided: previously provided Muscles treated: left peroneal for muscle activation with Estim frequency 2 and intensity 3-4 to tolerance Treatment response/outcome: twitch response noted bilaterally       PATIENT EDUCATION:  Education details: exam findings and PT plan of care Person educated: Patient Education method: Explanation Education comprehension: verbalized understanding     HOME EXERCISE PROGRAM: Will review his HEP from last episode of care and revise PRN next session       ASSESSMENT:   CLINICAL IMPRESSION: He continues to have difficulty with ankle EV activation and strength which remains the focus of our program along with balance training to reduce his risk of falls. PT remains medically necessary for this patient.   OBJECTIVE IMPAIRMENTS: decreased activity tolerance, difficulty walking, decreased balance, decreased endurance, decreased mobility, decreased ROM, decreased strength, impaired flexibility, impaired LE use, postural dysfunction, and pain.   ACTIVITY LIMITATIONS: bending, lifting, carry, locomotion, cleaning, community activity, and or occupation   PERSONAL FACTORS: Lt peroneal nerve decompression, foot drop,2020 Colon Cancer, Neuropathy in feet, PVD, prostate cancer, hernia repair are also affecting patient's functional outcome.   REHAB POTENTIAL: Fair     CLINICAL DECISION MAKING: Stable/uncomplicated   EVALUATION COMPLEXITY: Low       GOALS: Short term PT Goals Target date: 03/31/21 Pt will be I and compliant with HEP. Baseline:  Goal status: MET Pt will decrease pain by 25% overall with prolonged standing Baseline: Goal status:MET   Long term PT goals Target date: 05/23/21 Pt will improve left ankle ROM to WFL (10 deg EV and DF) in supine to improve functional mobility Baseline: Goal status: ongong 02/28/22 Pt will improve   hip/knee strength to at least 5-/5 MMT in sitting, and ankle EV strength to 4/5, ankle DF strength to 4+ to improve functional strength Baseline: Goal status: ongoing 02/28/22 Pt will improve FOTO to at least 69% functional to show improved function Baseline: Goal status: Discontinued, incorrect body part/set up Pt will reduce pain by overall 50% overall with usual activity and prolonged standing Baseline: Goal status: ongoing 02/28/22    PLAN: PT FREQUENCY: 1-2 times per week    PT DURATION: 8-12 weeks   PLANNED INTERVENTIONS (unless contraindicated): aquatic PT, Canalith repositioning, cryotherapy, Electrical stimulation, Iontophoresis with 4 mg/ml dexamethasome, Moist heat, traction, Ultrasound, gait training, Therapeutic exercise, balance training, neuromuscular re-education, patient/family education, prosthetic training, manual techniques, passive ROM, dry needling, taping, vasopnuematic device, vestibular, spinal manipulations, joint manipulations   PLAN FOR NEXT SESSION: DF and EV strength, balance, general leg strength, DN with stim and or [place] for peroneal recuitment    Debbe Odea, PT,DPT 03/02/2022, 8:00 AM

## 2022-03-06 ENCOUNTER — Ambulatory Visit (INDEPENDENT_AMBULATORY_CARE_PROVIDER_SITE_OTHER): Payer: Medicare Other | Admitting: Physical Therapy

## 2022-03-06 ENCOUNTER — Encounter: Payer: Self-pay | Admitting: Physical Therapy

## 2022-03-06 DIAGNOSIS — M79662 Pain in left lower leg: Secondary | ICD-10-CM | POA: Diagnosis not present

## 2022-03-06 DIAGNOSIS — M6281 Muscle weakness (generalized): Secondary | ICD-10-CM | POA: Diagnosis not present

## 2022-03-06 DIAGNOSIS — R262 Difficulty in walking, not elsewhere classified: Secondary | ICD-10-CM

## 2022-03-06 DIAGNOSIS — M25552 Pain in left hip: Secondary | ICD-10-CM | POA: Diagnosis not present

## 2022-03-06 NOTE — Therapy (Signed)
OUTPATIENT PHYSICAL THERAPY TREATMENT     Patient Name: Oscar Sparks MRN: [id number] DOB:April 09, 1940, 81 y.o., male Today's Date: 03/06/2022   END OF SESSION:   PT End of Session - 03/06/22 0814     Visit Number 31    Number of Visits 45    Date for PT Re-Evaluation 05/23/22    Authorization Type MCR    Progress Note Due on Visit 29    PT Start Time 0800    PT Stop Time 0845    PT Time Calculation (min) 45 min    Activity Tolerance Patient tolerated treatment well    Behavior During Therapy [facility] for tasks assessed/performed             Past Medical History:  Diagnosis Date   Anemia    Arthritis    knees   Colon cancer ([place]) 09/24/2018   ED (erectile dysfunction)    Inguinal hernia    Neuromuscular disorder (HCC)    neuropathy in feet   Peripheral vascular disease ([place])    poor curculation in hands and feet hard to monitor with pulse oximetry   Prostate cancer ([place])    sees Dr. Gaynelle Arabian, received cesium seeds    Past Surgical History:  Procedure Laterality Date   BACK SURGERY     INGUINAL HERNIA REPAIR Right 05/20/2021   Procedure: OPEN RIGHT INGUINAL HERNIA REPAIR WITH MESH;  Surgeon: Jovita Kussmaul, MD;  Location: [place];  Service: General;  Laterality: Right;   SPINE SURGERY  2004   Dr. 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 He can tell he is improving because he was able to take his trashcan down along with his neighbors as well and he has not been able to do this in a long time.   PERTINENT HISTORY: Lt peroneal nerve decompression, foot drop,2020 Colon Cancer, Neuropathy in feet, PVD, prostate cancer, hernia repair   PAIN:  Are you having pain? Yes: NPRS scale: 0/10 Pain location: left hip Pain description: inconvience Aggravating factors: prolonged standing or walking Relieving factors: rest, NSAIDS   PRECAUTIONS: Fall   WEIGHT BEARING RESTRICTIONS No   FALLS:  Has patient fallen in last 6 months? No, but has had several near falls   OCCUPATION: retired   PLOF: Independent with basic ADLs  PATIENT GOALS improve walking and balance by reducing foot drop     OBJECTIVE:    DIAGNOSTIC FINDINGS: nothing recent   PATIENT SURVEYS:  FOTO 61% functional intake at eval, goal is 69%   COGNITION:           Overall cognitive status: Within functional limits for tasks assessed                              LOWER EXTREMITY ROM:   Active ROM Left eval Left 11/17/21 Left 12/06/21 Left 01/10/22 Left 01/23/22 Left 02/28/22  Hip flexion         Hip extension         Hip abduction         Hip adduction         Hip internal rotation         Hip external rotation         Knee flexion         Knee extension         Ankle dorsiflexion _0 Ankle  plantarflexion WNL     WNL  Ankle inversion WNL     WNL  Ankle eversion _1 (Blank rows = not tested)   LOWER EXTREMITY MMT:   MMT in sitting/supine Left eval Left 12/06/21 Left 01/10/22 Left 01/23/22 Left 02/20/22  Hip flexion 3+ 4     Hip extension        Hip abduction 3 4     Hip adduction        Hip internal rotation        Hip external rotation        Knee flexion        Knee extension        Ankle dorsiflexion 4 4+   4+  Ankle plantarflexion 4 4+   4+  Ankle inversion 5 5     Ankle eversion 2+ 3- 3 3+ 3+   (Blank rows = not tested)   LOWER EXTREMITY SPECIAL TESTS:      FUNCTIONAL TESTS:  10/24/21: 5 times sit to stand: 11.74 seconds without UE support   GAIT: Distance walked: Information systems manager utilized: Single point cane Level of assistance: Modified independence Comments: foot drop noted on left with trendelenburg       TODAY'S TREATMENT: 03/06/22 -Recumbent bike L3 X 8 min -Slantboard 30 sec X 3 -Eccentric heel raises on left 2X10 -Heel and toe raises 15 bilat -Tandem balance 30 sec X 2 bilat -SLS balance 10 sec X 3 bilat -Tandem walk and sidestepping on foam X 5 round trips each in bars -ankle rocker board 1.5 min A-P, 1.5 min laterald -ankle circles X 15 each way and ankle aplhabet with 3# around foot in supine -ankle DF,PF,EV red X 15 each  --Manual therapy for skilled palpation and compression with Trigger Point Dry-Needling   Patient Consent Given: Yes Education handout provided: previously provided Muscles treated: left peroneal for muscle activation with Estim frequency 2 and intensity 3-4 to tolerance Treatment response/outcome: twitch response noted bilaterally  03/02/22 -Bike L3 x 8 min -Slantboard 30 sec X 3 -Heel raises eccentric up with both and down with left only 2X10 -Heel and toe raises X 15 bilat -tandem stance 20 sec X 2 bilat -Single leg heel taps for balance tapping yoga block X 15  bilat -sidestepping  on foam and tandem walk on foam 5 round trips in bars -Rocker board 1.5 min A-P, 1.5 min lateral -Supine ankle DF, PF, EV with red X 15 each -Supine ankle DF,EV,INV red  X15  -E-stim russian/NMES for motor recruitment of peroneals/Tibialis anterior 5/5 sec on/off with intensity to tolerance and active EV/DF contraction/AROM for 5 sec hold during on phase in sidelying X 10 total minutes          PATIENT EDUCATION:  Education details: exam findings and PT plan of care Person educated: Patient Education method: Explanation Education comprehension: verbalized understanding     HOME EXERCISE PROGRAM: Will review his HEP from last episode of care and revise PRN next session       ASSESSMENT:   CLINICAL IMPRESSION: He is showing some subjective and objective improvements with his functional abilities. He showed good contraction of peroneals with DN combined with Estim today.  PT remains medically necessary for this patient.   OBJECTIVE IMPAIRMENTS: decreased activity tolerance, difficulty walking, decreased balance, decreased endurance, decreased mobility, decreased ROM, decreased strength, impaired flexibility, impaired LE use, postural dysfunction, and pain.   ACTIVITY LIMITATIONS: bending, lifting, carry, locomotion, cleaning, community activity, and or occupation   PERSONAL FACTORS: Lt peroneal nerve decompression, foot drop,2020 Colon Cancer, Neuropathy in feet, PVD, prostate cancer, hernia repair are also affecting patient's functional outcome.   REHAB POTENTIAL: Fair     CLINICAL DECISION MAKING: Stable/uncomplicated   EVALUATION COMPLEXITY: Low       GOALS: Short term PT Goals Target date: 03/31/21 Pt will be I and compliant with HEP. Baseline:  Goal status: MET Pt will decrease pain by 25% overall with prolonged standing Baseline: Goal status:MET   Long term PT goals Target date: 05/23/21 Pt will improve left ankle ROM to WFL (10 deg EV and DF)  in supine to improve functional mobility Baseline: Goal status: ongong 02/28/22 Pt will improve  hip/knee strength to at least 5-/5 MMT in sitting, and ankle EV strength to 4/5, ankle DF strength to 4+ to improve functional strength Baseline: Goal status: ongoing 02/28/22 Pt will improve FOTO to at least 69% functional to show improved function Baseline: Goal status: Discontinued, incorrect body part/set up Pt will reduce pain by overall 50% overall with usual activity and prolonged standing Baseline: Goal status: ongoing 02/28/22    PLAN: PT FREQUENCY: 1-2 times per week    PT DURATION: 8-12 weeks   PLANNED INTERVENTIONS (unless contraindicated): aquatic PT, Canalith repositioning, cryotherapy, Electrical stimulation, Iontophoresis with 4 mg/ml dexamethasome, Moist heat, traction, Ultrasound, gait training, Therapeutic exercise, balance training, neuromuscular re-education, patient/family education, prosthetic training, manual techniques, passive ROM, dry needling, taping, vasopnuematic device, vestibular, spinal manipulations, joint manipulations   PLAN FOR NEXT SESSION: DF and EV strength, balance, general leg strength, DN with stim and or [place] for peroneal recuitment    Debbe Odea, PT,DPT 03/06/2022, 8:14 AM

## 2022-03-08 ENCOUNTER — Encounter: Payer: Self-pay | Admitting: Physical Therapy

## 2022-03-08 ENCOUNTER — Ambulatory Visit (INDEPENDENT_AMBULATORY_CARE_PROVIDER_SITE_OTHER): Payer: Medicare Other | Admitting: Physical Therapy

## 2022-03-08 DIAGNOSIS — M25552 Pain in left hip: Secondary | ICD-10-CM

## 2022-03-08 DIAGNOSIS — M6281 Muscle weakness (generalized): Secondary | ICD-10-CM | POA: Diagnosis not present

## 2022-03-08 DIAGNOSIS — R262 Difficulty in walking, not elsewhere classified: Secondary | ICD-10-CM | POA: Diagnosis not present

## 2022-03-08 DIAGNOSIS — M79662 Pain in left lower leg: Secondary | ICD-10-CM | POA: Diagnosis not present

## 2022-03-08 NOTE — Therapy (Signed)
OUTPATIENT PHYSICAL THERAPY TREATMENT     Patient Name: Oscar Sparks MRN: [id number] DOB:[birth year], 81 y.o., male Today's Date: 03/08/2022   END OF SESSION:   PT End of Session - 03/08/22 0801     Visit Number 32    Number of Visits 7    Date for PT Re-Evaluation 05/23/22    Authorization Type MCR    Progress Note Due on Visit 72    PT Start Time 0800    PT Stop Time 0845    PT Time Calculation (min) 45 min    Activity Tolerance Patient tolerated treatment well    Behavior During Therapy [facility] for tasks assessed/performed             Past Medical History:  Diagnosis Date   Anemia    Arthritis    knees   Colon cancer ([place]) 09/24/2018   ED (erectile dysfunction)    Inguinal hernia    Neuromuscular disorder (HCC)    neuropathy in feet   Peripheral vascular disease ([employer])    poor curculation in hands and feet hard to monitor with pulse oximetry   Prostate cancer ([place])    sees Dr. Gaynelle Arabian, received cesium seeds    Past Surgical History:  Procedure Laterality Date   BACK SURGERY     INGUINAL HERNIA REPAIR Right 05/20/2021   Procedure: OPEN RIGHT INGUINAL HERNIA REPAIR WITH MESH;  Surgeon: Jovita Kussmaul, MD;  Location: [place];  Service: General;  Laterality: Right;   SPINE SURGERY  2004   Dr. 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  PERTINENT HISTORY: Lt peroneal nerve decompression, foot drop,2020 Colon Cancer, Neuropathy in feet, PVD, prostate cancer, hernia repair   PAIN:  Are you having pain? Yes: NPRS scale: 0/10 Pain location: left hip Pain description: inconvience Aggravating factors: prolonged standing or walking Relieving factors: rest, NSAIDS   PRECAUTIONS: Fall   WEIGHT BEARING RESTRICTIONS No   FALLS:  Has patient fallen in last 6 months? No, but has had several near falls   OCCUPATION: retired   PLOF: Independent with basic ADLs   PATIENT GOALS improve walking and balance by reducing foot drop     OBJECTIVE:    DIAGNOSTIC FINDINGS: nothing recent   PATIENT SURVEYS:  FOTO 61%  functional intake at eval, goal is 69%   COGNITION:           Overall cognitive status: Within functional limits for tasks assessed                              LOWER EXTREMITY ROM:   Active ROM Left eval Left 11/17/21 Left 12/06/21 Left 01/10/22 Left 01/23/22 Left 02/28/22  Hip flexion         Hip extension         Hip abduction         Hip adduction         Hip internal rotation         Hip external rotation         Knee flexion         Knee extension         Ankle dorsiflexion _0 Ankle plantarflexion WNL     WNL  Ankle inversion WNL     WNL  Ankle eversion _1 (Blank rows = not tested)   LOWER  EXTREMITY MMT:   MMT in sitting/supine Left eval Left 12/06/21 Left 01/10/22 Left 01/23/22 Left 02/20/22  Hip flexion 3+ 4     Hip extension        Hip abduction 3 4     Hip adduction        Hip internal rotation        Hip external rotation        Knee flexion        Knee extension        Ankle dorsiflexion 4 4+   4+  Ankle plantarflexion 4 4+   4+  Ankle inversion 5 5     Ankle eversion 2+ 3- 3 3+ 3+   (Blank rows = not tested)   LOWER EXTREMITY SPECIAL TESTS:      FUNCTIONAL TESTS:  10/24/21: 5 times sit to stand: 11.74 seconds without UE support   GAIT: Distance walked: Information systems manager utilized: Single point cane Level of assistance: Modified independence Comments: foot drop noted on left with trendelenburg       TODAY'S TREATMENT: 03/08/22 -Nu step L5 x 8 min -Slantboard 30 sec X 3 -Heel raises eccentric up with both and down with left only 2X10 -Heel and toe raises X 15 bilat -tandem stance 20 sec X 2 bilat -Single leg heel taps for balance tapping cone X 15 bilat -sidestepping on foam and tandem walk on foam 5 round trips in bars -Rocker board 1.5 min A-P, 1.5 min lateral -Supine ankle DF, PF, EV with red 2X10 each -Supine ankle cirlces x 10 and alphabet X1 3#  -E-stim russian/NMES for motor recruitment of peroneals/Tibialis anterior 5/5 sec on/off with intensity to tolerance and active EV/DF contraction/AROM for 5 sec hold during on phase in sidelying X 8 total minutes   03/06/22 -Recumbent bike L3 X 8 min -Slantboard 30 sec X 3 -Eccentric heel raises on left 2X10 -Heel and toe raises 15 bilat -Tandem balance 30 sec X 2 bilat -SLS balance 10 sec X 3 bilat -Tandem walk and sidestepping on foam X 5 round trips each in bars -ankle rocker board 1.5 min A-P, 1.5 min laterald -ankle circles X 15 each way and ankle aplhabet with 3# around foot in supine -ankle DF,PF,EV red X 15 each  --Manual therapy  for skilled palpation and  compression with Trigger Point Dry-Needling   Patient Consent Given: Yes Education handout provided: previously provided Muscles treated: left peroneal for muscle activation with Estim frequency 2 and intensity 3-4 to tolerance Treatment response/outcome: twitch response noted bilaterally       PATIENT EDUCATION:  Education details: exam findings and PT plan of care Person educated: Patient Education method: Explanation Education comprehension: verbalized understanding     HOME EXERCISE PROGRAM: Will review his HEP from last episode of care and revise PRN next session       ASSESSMENT:   CLINICAL IMPRESSION: I observed some improvement in his active EV ROM. We are working more on balance and Single leg stability as he continues to have some ankle instability with this.  PT recommending to continue with current plan.    OBJECTIVE IMPAIRMENTS: decreased activity tolerance, difficulty walking, decreased balance, decreased endurance, decreased mobility, decreased ROM, decreased strength, impaired flexibility, impaired LE use, postural dysfunction, and pain.   ACTIVITY LIMITATIONS: bending, lifting, carry, locomotion, cleaning, community activity, and or occupation   PERSONAL FACTORS: Lt peroneal nerve decompression, foot drop,2020 Colon Cancer, Neuropathy in feet, PVD, prostate cancer, hernia repair are also affecting patient's functional outcome.   REHAB POTENTIAL: Fair     CLINICAL DECISION MAKING: Stable/uncomplicated   EVALUATION COMPLEXITY: Low       GOALS: Short term PT Goals Target date: 03/31/21 Pt will be I and compliant with HEP. Baseline:  Goal status: MET Pt will decrease pain by 25% overall with prolonged standing Baseline: Goal status:MET   Long term PT goals Target date: 05/23/21 Pt will improve left ankle ROM to WFL (10 deg EV and DF) in supine to improve functional mobility Baseline: Goal status: ongong 02/28/22 Pt will improve  hip/knee strength to at  least 5-/5 MMT in sitting, and ankle EV strength to 4/5, ankle DF strength to 4+ to improve functional strength Baseline: Goal status: ongoing 02/28/22 Pt will improve FOTO to at least 69% functional to show improved function Baseline: Goal status: Discontinued, incorrect body part/set up Pt will reduce pain by overall 50% overall with usual activity and prolonged standing Baseline: Goal status: ongoing 02/28/22    PLAN: PT FREQUENCY: 1-2 times per week    PT DURATION: 8-12 weeks   PLANNED INTERVENTIONS (unless contraindicated): aquatic PT, Canalith repositioning, cryotherapy, Electrical stimulation, Iontophoresis with 4 mg/ml dexamethasome, Moist heat, traction, Ultrasound, gait training, Therapeutic exercise, balance training, neuromuscular re-education, patient/family education, prosthetic training, manual techniques, passive ROM, dry needling, taping, vasopnuematic device, vestibular, spinal manipulations, joint manipulations   PLAN FOR NEXT SESSION: DF and EV strength, balance, general leg strength, DN with stim and or [place] for peroneal recuitment    Debbe Odea, PT,DPT 03/08/2022, 8:01 AM

## 2022-03-13 ENCOUNTER — Ambulatory Visit (INDEPENDENT_AMBULATORY_CARE_PROVIDER_SITE_OTHER): Payer: Medicare Other | Admitting: Physical Therapy

## 2022-03-13 ENCOUNTER — Encounter: Payer: Self-pay | Admitting: Physical Therapy

## 2022-03-13 DIAGNOSIS — R262 Difficulty in walking, not elsewhere classified: Secondary | ICD-10-CM | POA: Diagnosis not present

## 2022-03-13 DIAGNOSIS — M25552 Pain in left hip: Secondary | ICD-10-CM

## 2022-03-13 DIAGNOSIS — M79662 Pain in left lower leg: Secondary | ICD-10-CM

## 2022-03-13 DIAGNOSIS — M6281 Muscle weakness (generalized): Secondary | ICD-10-CM | POA: Diagnosis not present

## 2022-03-13 NOTE — Therapy (Signed)
OUTPATIENT PHYSICAL THERAPY TREATMENT     Patient Name: Oscar Sparks MRN: [id number] DOB:Jan 19, 1941, 81 y.o., male Today's Date: 03/13/2022   END OF SESSION:   PT End of Session - 03/13/22 0808     Visit Number 33    Number of Visits 45    Date for PT Re-Evaluation 05/23/22    Authorization Type MCR    Progress Note Due on Visit 2    PT Start Time 0800    PT Stop Time 0845    PT Time Calculation (min) 45 min    Activity Tolerance Patient tolerated treatment well    Behavior During Therapy [facility] for tasks assessed/performed             Past Medical History:  Diagnosis Date   Anemia    Arthritis    knees   Colon cancer ([place]) 09/24/2018   ED (erectile dysfunction)    Inguinal hernia    Neuromuscular disorder (HCC)    neuropathy in feet   Peripheral vascular disease ([place])    poor curculation in hands and feet hard to monitor with pulse oximetry   Prostate cancer ([place])    sees Dr. Gaynelle Arabian, received cesium seeds    Past Surgical History:  Procedure Laterality Date   BACK SURGERY     INGUINAL HERNIA REPAIR Right 05/20/2021   Procedure: OPEN RIGHT INGUINAL HERNIA REPAIR WITH MESH;  Surgeon: Jovita Kussmaul, MD;  Location: [place];  Service: General;  Laterality: Right;   SPINE SURGERY  2004   Dr. 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 Yes: NPRS scale: 0/10 Pain location: left hip Pain description: inconvience Aggravating factors: prolonged standing or walking Relieving factors: rest, NSAIDS   PRECAUTIONS: Fall   WEIGHT BEARING RESTRICTIONS No   FALLS:  Has patient fallen in last 6 months? No, but has had several near falls   OCCUPATION: retired   PLOF: Independent with basic ADLs   PATIENT GOALS improve walking and balance by reducing foot drop     OBJECTIVE:    DIAGNOSTIC FINDINGS: nothing  recent   PATIENT SURVEYS:  FOTO 61% functional intake at eval, goal is 69%   COGNITION:           Overall cognitive status: Within functional limits for tasks assessed                              LOWER EXTREMITY ROM:   Active ROM Left eval Left 11/17/21 Left 12/06/21 Left 01/10/22 Left 01/23/22 Left 02/28/22  Hip flexion         Hip extension         Hip abduction         Hip adduction         Hip internal rotation         Hip external rotation         Knee flexion         Knee extension         Ankle dorsiflexion _0 Ankle plantarflexion WNL     WNL  Ankle inversion WNL     WNL  Ankle eversion _1 (Blank  rows = not tested)   LOWER EXTREMITY MMT:   MMT in sitting/supine Left eval Left 12/06/21 Left 01/10/22 Left 01/23/22 Left 02/20/22  Hip flexion 3+ 4     Hip extension        Hip abduction 3 4     Hip adduction        Hip internal rotation        Hip external rotation        Knee flexion        Knee extension        Ankle dorsiflexion 4 4+   4+  Ankle plantarflexion 4 4+   4+  Ankle inversion 5 5     Ankle eversion 2+ 3- 3 3+ 3+   (Blank rows = not tested)   LOWER EXTREMITY SPECIAL TESTS:      FUNCTIONAL TESTS:  10/24/21: 5 times sit to stand: 11.74 seconds without UE support   GAIT: Distance walked: Information systems manager utilized: Single point cane Level of assistance: Modified independence Comments: foot drop noted on left with trendelenburg       TODAY'S TREATMENT: 03/13/22 -Recumbent bike L3 X 8 min -Slantboard 30 sec X 3 -Eccentric heel raises on left 2X10 -Heel and toe raises 15 bilat -Tandem balance 30 sec X 2 bilat -SLS balance 10 sec X 5 bilat -DL leg press 125# 2X15, SL 75# 2X10s -ankle rocker board 1.5 min A-P, 1.5 min laterald -ankle circles X 15 each way and ankle aplhabet with 3# around foot in supine -ankle DF,PF,EV red X 15 each  --Manual therapy for skilled palpation and compression with Trigger Point Dry-Needling   Patient Consent Given: Yes Education handout provided: previously provided Muscles treated: left peroneal for muscle activation with Estim frequency 2 and intensity 3-4 to tolerance Treatment response/outcome: twitch response noted bilaterally  03/08/22 -Nu step L5 x 8 min -Slantboard 30 sec X 3 -Heel raises eccentric up with both and down with left only 2X10 -Heel and toe raises X 15 bilat -tandem stance 20 sec X 2 bilat -Single leg heel taps for balance tapping cone X 15 bilat -sidestepping on foam and tandem walk on foam 5 round trips in bars -Rocker board 1.5 min A-P, 1.5 min lateral -Supine ankle DF, PF,  EV with  red 2X10 each -Supine ankle cirlces x 10 and alphabet X1 3#  -E-stim russian/NMES for motor recruitment of peroneals/Tibialis anterior 5/5 sec on/off with intensity to tolerance and active EV/DF contraction/AROM for 5 sec hold during on phase in sidelying X 8 total minutes      PATIENT EDUCATION:  Education details: exam findings and PT plan of care Person educated: Patient Education method: Explanation Education comprehension: verbalized understanding     HOME EXERCISE PROGRAM: Will review his HEP from last episode of care and revise PRN next session       ASSESSMENT:   CLINICAL IMPRESSION: We continued to work on his left ankle stability and strength needed more improved gait stability. PT remains medically necessary to limit his fall risk.    OBJECTIVE IMPAIRMENTS: decreased activity tolerance, difficulty walking, decreased balance, decreased endurance, decreased mobility, decreased ROM, decreased strength, impaired flexibility, impaired LE use, postural dysfunction, and pain.   ACTIVITY LIMITATIONS: bending, lifting, carry, locomotion, cleaning, community activity, and or occupation   PERSONAL FACTORS: Lt peroneal nerve decompression, foot drop,2020 Colon Cancer, Neuropathy in feet, PVD, prostate cancer, hernia repair are also affecting patient's functional outcome.   REHAB POTENTIAL: Fair     CLINICAL DECISION MAKING: Stable/uncomplicated   EVALUATION COMPLEXITY: Low       GOALS: Short term PT Goals Target date: 03/31/21 Pt will be I and compliant with HEP. Baseline:  Goal status: MET Pt will decrease pain by 25% overall with prolonged standing Baseline: Goal status:MET   Long term PT goals Target date: 05/23/21 Pt will improve left ankle ROM to WFL (10 deg EV and DF) in supine to improve functional mobility Baseline: Goal status: ongong 02/28/22 Pt will improve  hip/knee strength to at least 5-/5 MMT in sitting, and ankle EV strength to 4/5, ankle DF  strength to 4+ to improve functional strength Baseline: Goal status: ongoing 02/28/22 Pt will improve FOTO to at least 69% functional to show improved function Baseline: Goal status: Discontinued, incorrect body part/set up Pt will reduce pain by overall 50% overall with usual activity and prolonged standing Baseline: Goal status: ongoing 02/28/22    PLAN: PT FREQUENCY: 1-2 times per week    PT DURATION: 8-12 weeks   PLANNED INTERVENTIONS (unless contraindicated): aquatic PT, Canalith repositioning, cryotherapy, Electrical stimulation, Iontophoresis with 4 mg/ml dexamethasome, Moist heat, traction, Ultrasound, gait training, Therapeutic exercise, balance training, neuromuscular re-education, patient/family education, prosthetic training, manual techniques, passive ROM, dry needling, taping, vasopnuematic device, vestibular, spinal manipulations, joint manipulations   PLAN FOR NEXT SESSION: DF and EV strength, balance, general leg strength, DN with stim and or [place] for peroneal recuitment    Debbe Odea, PT,DPT 03/13/2022, 8:09 AM

## 2022-03-15 ENCOUNTER — Encounter: Payer: Self-pay | Admitting: Physical Therapy

## 2022-03-15 ENCOUNTER — Ambulatory Visit (INDEPENDENT_AMBULATORY_CARE_PROVIDER_SITE_OTHER): Payer: Medicare Other | Admitting: Physical Therapy

## 2022-03-15 DIAGNOSIS — M79662 Pain in left lower leg: Secondary | ICD-10-CM | POA: Diagnosis not present

## 2022-03-15 DIAGNOSIS — M25552 Pain in left hip: Secondary | ICD-10-CM | POA: Diagnosis not present

## 2022-03-15 DIAGNOSIS — R262 Difficulty in walking, not elsewhere classified: Secondary | ICD-10-CM | POA: Diagnosis not present

## 2022-03-15 DIAGNOSIS — M6281 Muscle weakness (generalized): Secondary | ICD-10-CM

## 2022-03-15 NOTE — Therapy (Signed)
OUTPATIENT PHYSICAL THERAPY TREATMENT     Patient Name: Oscar Sparks MRN: [id number] DOB:[birth year], 81 y.o., male Today's Date: 03/15/2022   END OF SESSION:   PT End of Session - 03/15/22 0816     Visit Number 34    Number of Visits 45    Date for PT Re-Evaluation 05/23/22    Authorization Type MCR    Progress Note Due on Visit 8    PT Start Time 0800    PT Stop Time 0845    PT Time Calculation (min) 45 min    Activity Tolerance Patient tolerated treatment well    Behavior During Therapy [facility] for tasks assessed/performed             Past Medical History:  Diagnosis Date   Anemia    Arthritis    knees   Colon cancer ([place]) 09/24/2018   ED (erectile dysfunction)    Inguinal hernia    Neuromuscular disorder (HCC)    neuropathy in feet   Peripheral vascular disease ([place])    poor curculation in hands and feet hard to monitor with pulse oximetry   Prostate cancer ([place])    sees Dr. Gaynelle Arabian, received cesium seeds    Past Surgical History:  Procedure Laterality Date   BACK SURGERY     INGUINAL HERNIA REPAIR Right 05/20/2021   Procedure: OPEN RIGHT INGUINAL HERNIA REPAIR WITH MESH;  Surgeon: Jovita Kussmaul, MD;  Location: [place];  Service: General;  Laterality: Right;   SPINE SURGERY  2004   Dr. 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 Yes: NPRS scale: 0/10 Pain location: left hip Pain description: inconvience Aggravating factors: prolonged standing or walking Relieving factors: rest, NSAIDS   PRECAUTIONS: Fall   WEIGHT BEARING RESTRICTIONS No   FALLS:  Has patient fallen in last 6 months? No, but has had several near falls   OCCUPATION: retired   PLOF: Independent with basic ADLs   PATIENT GOALS improve walking and balance by reducing foot drop     OBJECTIVE:    DIAGNOSTIC FINDINGS: nothing  recent   PATIENT SURVEYS:  FOTO 61% functional intake at eval, goal is 69%   COGNITION:           Overall cognitive status: Within functional limits for tasks assessed                              LOWER EXTREMITY ROM:   Active ROM Left eval Left 11/17/21 Left 12/06/21 Left 01/10/22 Left 01/23/22 Left 02/28/22  Hip flexion         Hip extension         Hip abduction         Hip adduction         Hip internal rotation         Hip external rotation         Knee flexion         Knee extension         Ankle dorsiflexion _0 Ankle plantarflexion WNL     WNL  Ankle inversion WNL     WNL  Ankle eversion _1 (Blank rows = not  tested)   LOWER EXTREMITY MMT:   MMT in sitting/supine Left eval Left 12/06/21 Left 01/10/22 Left 01/23/22 Left 02/20/22  Hip flexion 3+ 4     Hip extension        Hip abduction 3 4     Hip adduction        Hip internal rotation        Hip external rotation        Knee flexion        Knee extension        Ankle dorsiflexion 4 4+   4+  Ankle plantarflexion 4 4+   4+  Ankle inversion 5 5     Ankle eversion 2+ 3- 3 3+ 3+   (Blank rows = not tested)   LOWER EXTREMITY SPECIAL TESTS:      FUNCTIONAL TESTS:  10/24/21: 5 times sit to stand: 11.74 seconds without UE support   GAIT: Distance walked: Buyer, retail device utilized: Single point cane Level of assistance: Modified independence Comments: foot drop noted on left with trendelenburg       TODAY'S TREATMENT: 03/15/22 -Recumbent bike L3 X 8 min -Slantboard 30 sec X 3 -Eccentric heel raises on left 2X10 -Heel and toe raises 15 bilat -Tandem balance 30 sec X 2 bilat -SLS balance 10 sec X 5 bilat -DL leg press 125# 2X15, SL 75# 2X10 -ankle rocker board 1.5 min A-P, 1.5 min laterald -ankle circles X 15 each way and ankle aplhabet with 3# around foot in supine -ankle DF,PF,EV red X 15 each  -E-stim russian/NMES for motor recruitment of peroneals/Tibialis anterior 10/10 sec on/off with intensity to tolerance and active EV/DF contraction/AROM for 5 sec hold during on phase in sidelying X 8 total minutes   03/13/22 -Recumbent bike L3 X 8 min -Slantboard 30 sec X 3 -Eccentric heel raises on left 2X10 -Heel and toe raises 15 bilat -Tandem balance 30 sec X 2 bilat -SLS balance 10 sec X 5 bilat -DL leg press 125# 2X15, SL 75# 2X10 -ankle rocker board 1.5 min A-P, 1.5 min laterald -ankle circles X 15 each way and ankle aplhabet with 3# around foot in supine -ankle DF,PF,EV red X 15 each  --Manual therapy for skilled palpation and compression with Trigger Point Dry-Needling  Patient Consent Given:  Yes Education handout provided: previously provided Muscles treated: left peroneal for muscle activation with Estim frequency 2 and intensity 3-4 to tolerance Treatment response/outcome: twitch response noted bilaterally    PATIENT EDUCATION:  Education details: exam findings and PT plan of care Person educated: Patient Education method: Explanation Education comprehension: verbalized understanding     HOME EXERCISE PROGRAM: Will review his HEP from last episode of care and revise PRN next session       ASSESSMENT:   CLINICAL IMPRESSION: he looked more overall steady with gait today and had improved ankle EV ROM/strength noted with walking. PT recommending to continue current plan of care.   OBJECTIVE IMPAIRMENTS: decreased activity tolerance, difficulty walking, decreased balance, decreased endurance, decreased mobility, decreased ROM, decreased strength, impaired flexibility, impaired LE use, postural dysfunction, and pain.   ACTIVITY LIMITATIONS: bending, lifting, carry, locomotion, cleaning, community activity, and or occupation   PERSONAL FACTORS: Lt peroneal nerve decompression, foot drop,2020 Colon Cancer, Neuropathy in feet, PVD, prostate cancer, hernia repair are also affecting patient's functional outcome.   REHAB POTENTIAL: Fair     CLINICAL DECISION MAKING: Stable/uncomplicated   EVALUATION COMPLEXITY: Low       GOALS: Short term PT Goals Target date: 03/31/21 Pt will be I and compliant with HEP. Baseline:  Goal status: MET Pt will decrease pain by 25% overall with prolonged standing Baseline: Goal status:MET   Long term PT goals Target date: 05/23/21 Pt will improve left ankle ROM to WFL (10 deg EV and DF) in supine to improve functional mobility Baseline: Goal status: ongong 02/28/22 Pt will improve  hip/knee strength to at least 5-/5 MMT in sitting, and ankle EV strength to 4/5, ankle DF strength to 4+ to improve functional strength Baseline: Goal status:  ongoing 02/28/22 Pt will improve FOTO to at least 69% functional to show improved function Baseline: Goal status: Discontinued, incorrect body part/set up Pt will reduce pain by overall 50% overall with usual activity and prolonged standing Baseline: Goal status: ongoing 02/28/22    PLAN: PT FREQUENCY: 1-2 times per week    PT DURATION: 8-12 weeks   PLANNED INTERVENTIONS (unless contraindicated): aquatic PT, Canalith repositioning, cryotherapy, Electrical stimulation, Iontophoresis with 4 mg/ml dexamethasome, Moist heat, traction, Ultrasound, gait training, Therapeutic exercise, balance training, neuromuscular re-education, patient/family education, prosthetic training, manual techniques, passive ROM, dry needling, taping, vasopnuematic device, vestibular, spinal manipulations, joint manipulations   PLAN FOR NEXT SESSION: DF and EV strength, balance, general leg strength, DN with stim and or [place] for peroneal recuitment    Debbe Odea, PT,DPT 03/15/2022, 8:18 AM

## 2022-03-22 ENCOUNTER — Encounter: Payer: Self-pay | Admitting: Physical Therapy

## 2022-03-22 ENCOUNTER — Ambulatory Visit (INDEPENDENT_AMBULATORY_CARE_PROVIDER_SITE_OTHER): Payer: Medicare Other | Admitting: Physical Therapy

## 2022-03-22 DIAGNOSIS — M6281 Muscle weakness (generalized): Secondary | ICD-10-CM

## 2022-03-22 DIAGNOSIS — M25552 Pain in left hip: Secondary | ICD-10-CM | POA: Diagnosis not present

## 2022-03-22 DIAGNOSIS — M79662 Pain in left lower leg: Secondary | ICD-10-CM

## 2022-03-22 DIAGNOSIS — R262 Difficulty in walking, not elsewhere classified: Secondary | ICD-10-CM

## 2022-03-22 NOTE — Therapy (Signed)
OUTPATIENT PHYSICAL THERAPY TREATMENT     Patient Name: Oscar Sparks MRN: [id number] DOB:[birth year], 81 y.o., male Today's Date: 03/22/2022   END OF SESSION:   PT End of Session - 03/22/22 0807     Visit Number 35    Number of Visits 45    Date for PT Re-Evaluation 05/23/22    Authorization Type MCR    Progress Note Due on Visit 39    PT Start Time 0800    PT Stop Time 0845    PT Time Calculation (min) 45 min    Activity Tolerance Patient tolerated treatment well    Behavior During Therapy [facility] for tasks assessed/performed             Past Medical History:  Diagnosis Date   Anemia    Arthritis    knees   Colon cancer ([place]) 09/24/2018   ED (erectile dysfunction)    Inguinal hernia    Neuromuscular disorder (HCC)    neuropathy in feet   Peripheral vascular disease ([place])    poor curculation in hands and feet hard to monitor with pulse oximetry   Prostate cancer ([place])    sees Dr. Gaynelle Arabian, received cesium seeds    Past Surgical History:  Procedure Laterality Date   BACK SURGERY     INGUINAL HERNIA REPAIR Right 05/20/2021   Procedure: OPEN RIGHT INGUINAL HERNIA REPAIR WITH MESH;  Surgeon: Jovita Kussmaul, MD;  Location: [place];  Service: General;  Laterality: Right;   SPINE SURGERY  2004   Dr. 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  PERTINENT HISTORY: Lt peroneal nerve decompression, foot drop,2020 Colon Cancer, Neuropathy in feet, PVD, prostate cancer, hernia repair   PAIN:  Are you having pain? Yes: NPRS scale: 0/10 Pain location: left hip Pain description: inconvience Aggravating factors: prolonged standing or walking Relieving factors: rest, NSAIDS   PRECAUTIONS: Fall   WEIGHT BEARING RESTRICTIONS No   FALLS:  Has patient fallen in last 6 months? No, but has had several near falls   OCCUPATION: retired   PLOF: Independent with basic ADLs   PATIENT GOALS improve walking and balance by reducing foot drop  OBJECTIVE:    DIAGNOSTIC FINDINGS: nothing recent   PATIENT SURVEYS:  FOTO 61% functional intake at eval, goal is 69%   COGNITION:           Overall cognitive status: Within functional limits for tasks assessed                              LOWER EXTREMITY ROM:   Active ROM Left eval Left 11/17/21 Left 12/06/21 Left 01/10/22 Left 01/23/22 Left 02/28/22  Hip flexion         Hip extension         Hip abduction         Hip adduction         Hip internal rotation         Hip external rotation         Knee flexion         Knee extension         Ankle dorsiflexion _0 Ankle plantarflexion WNL     WNL  Ankle inversion WNL     WNL   Ankle eversion _1 (Blank rows = not tested)   LOWER EXTREMITY MMT:   MMT in sitting/supine Left eval Left 12/06/21 Left 01/10/22 Left 01/23/22 Left 02/20/22  Hip flexion 3+ 4     Hip extension        Hip abduction 3 4     Hip adduction        Hip internal rotation        Hip external rotation        Knee flexion        Knee extension        Ankle dorsiflexion 4 4+   4+  Ankle plantarflexion 4 4+   4+  Ankle inversion 5 5     Ankle eversion 2+ 3- 3 3+ 3+   (Blank rows = not tested)   LOWER EXTREMITY SPECIAL TESTS:      FUNCTIONAL TESTS:  10/24/21: 5 times sit to stand: 11.74 seconds without UE support   GAIT: Distance walked: Buyer, retail device utilized: Single point cane Level of assistance: Modified independence Comments: foot drop noted on left with trendelenburg       TODAY'S TREATMENT: 03/22/22 -Recumbent bike L3 X 8 min -Slantboard 30 sec X 3 -Eccentric heel raises on left 2X10 -Tandem balance 30 sec X 2 bilat -SLS balance 10 sec X 5 bilat -DL leg press 125# 2X15, SL 75# 2X10 -ankle rocker board 1.5 min A-P, 1.5 min lateral -ankle circles X 15 each way and ankle alphabet with 3# around foot in supine -ankle DF,PF,EV red X 15 each  -Manual therapy for skilled palpation and compression with Trigger Point Dry-Needling   Patient Consent Given: Yes Education handout provided: previously provided Muscles treated: left peroneal for muscle activation with Estim frequency 2 and intensity 3-4 to tolerance Treatment response/outcome: twitch response noted bilaterally  03/15/22 -Recumbent bike L3 X 8 min -Slantboard 30 sec X 3 -Eccentric heel raises on left 2X10 -Heel and toe raises 15 bilat -Tandem balance 30 sec X 2 bilat -SLS balance 10 sec X 5 bilat -DL leg press 125# 2X15, SL 75# 2X10 -ankle rocker board 1.5 min A-P, 1.5 min laterald -ankle circles X 15 each way and ankle aplhabet with 3# around foot in supine -ankle  DF,PF,EV red X 15  each  -E-stim russian/NMES for motor recruitment of peroneals/Tibialis anterior 10/10 sec on/off with intensity to tolerance and active EV/DF contraction/AROM for 5 sec hold during on phase in sidelying X 8 total minutes       PATIENT EDUCATION:  Education details: exam findings and PT plan of care Person educated: Patient Education method: Explanation Education comprehension: verbalized understanding     HOME EXERCISE PROGRAM: Will review his HEP from last episode of care and revise PRN next session       ASSESSMENT:   CLINICAL IMPRESSION: He showed better overall balance with his rocker board exercises. Overall gait stability has improved . PT recommending to continue current plan of care.   OBJECTIVE IMPAIRMENTS: decreased activity tolerance, difficulty walking, decreased balance, decreased endurance, decreased mobility, decreased ROM, decreased strength, impaired flexibility, impaired LE use, postural dysfunction, and pain.   ACTIVITY LIMITATIONS: bending, lifting, carry, locomotion, cleaning, community activity, and or occupation   PERSONAL FACTORS: Lt peroneal nerve decompression, foot drop,2020 Colon Cancer, Neuropathy in feet, PVD, prostate cancer, hernia repair are also affecting patient's functional outcome.   REHAB POTENTIAL: Fair     CLINICAL DECISION MAKING: Stable/uncomplicated   EVALUATION COMPLEXITY: Low       GOALS: Short term PT Goals Target date: 03/31/21 Pt will be I and compliant with HEP. Baseline:  Goal status: MET Pt will decrease pain by 25% overall with prolonged standing Baseline: Goal status:MET   Long term PT goals Target date: 05/23/21 Pt will improve left ankle ROM to WFL (10 deg EV and DF) in supine to improve functional mobility Baseline: Goal status: ongong 02/28/22 Pt will improve  hip/knee strength to at least 5-/5 MMT in sitting, and ankle EV strength to 4/5, ankle DF strength to 4+ to improve functional  strength Baseline: Goal status: ongoing 02/28/22 Pt will improve FOTO to at least 69% functional to show improved function Baseline: Goal status: Discontinued, incorrect body part/set up Pt will reduce pain by overall 50% overall with usual activity and prolonged standing Baseline: Goal status: ongoing 02/28/22    PLAN: PT FREQUENCY: 1-2 times per week    PT DURATION: 8-12 weeks   PLANNED INTERVENTIONS (unless contraindicated): aquatic PT, Canalith repositioning, cryotherapy, Electrical stimulation, Iontophoresis with 4 mg/ml dexamethasome, Moist heat, traction, Ultrasound, gait training, Therapeutic exercise, balance training, neuromuscular re-education, patient/family education, prosthetic training, manual techniques, passive ROM, dry needling, taping, vasopnuematic device, vestibular, spinal manipulations, joint manipulations   PLAN FOR NEXT SESSION: DF and EV strength, balance, general leg strength, DN with stim and or [place] for peroneal recuitment    Debbe Odea, PT,DPT 03/22/2022, 8:08 AM

## 2022-03-24 ENCOUNTER — Encounter: Payer: Self-pay | Admitting: Physical Therapy

## 2022-03-24 ENCOUNTER — Ambulatory Visit (INDEPENDENT_AMBULATORY_CARE_PROVIDER_SITE_OTHER): Payer: Medicare Other | Admitting: Physical Therapy

## 2022-03-24 DIAGNOSIS — M6281 Muscle weakness (generalized): Secondary | ICD-10-CM

## 2022-03-24 DIAGNOSIS — M25552 Pain in left hip: Secondary | ICD-10-CM

## 2022-03-24 DIAGNOSIS — R262 Difficulty in walking, not elsewhere classified: Secondary | ICD-10-CM | POA: Diagnosis not present

## 2022-03-24 DIAGNOSIS — M79662 Pain in left lower leg: Secondary | ICD-10-CM | POA: Diagnosis not present

## 2022-03-24 NOTE — Therapy (Signed)
OUTPATIENT PHYSICAL THERAPY TREATMENT     Patient Name: Oscar Sparks MRN: [id number] DOB:1940/07/09, 81 y.o., male Today's Date: 03/24/2022   END OF SESSION:   PT End of Session - 03/24/22 0843     Visit Number 36    Number of Visits 45    Date for PT Re-Evaluation 05/23/22    Authorization Type MCR    Progress Note Due on Visit 2    PT Start Time 0845    PT Stop Time 0930    PT Time Calculation (min) 45 min    Activity Tolerance Patient tolerated treatment well    Behavior During Therapy [facility] for tasks assessed/performed             Past Medical History:  Diagnosis Date   Anemia    Arthritis    knees   Colon cancer ([place]) 09/24/2018   ED (erectile dysfunction)    Inguinal hernia    Neuromuscular disorder (HCC)    neuropathy in feet   Peripheral vascular disease ([place])    poor curculation in hands and feet hard to monitor with pulse oximetry   Prostate cancer ([place])    sees Dr. Gaynelle Arabian, received cesium seeds    Past Surgical History:  Procedure Laterality Date   BACK SURGERY     INGUINAL HERNIA REPAIR Right 05/20/2021   Procedure: OPEN RIGHT INGUINAL HERNIA REPAIR WITH MESH;  Surgeon: Jovita Kussmaul, MD;  Location: [place];  Service: General;  Laterality: Right;   SPINE SURGERY  2004   Dr. 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  PERTINENT HISTORY: Lt peroneal nerve decompression, foot drop,2020 Colon Cancer, Neuropathy in feet, PVD, prostate cancer, hernia repair   PAIN:  Are you having pain? Yes: NPRS scale: 0/10 Pain location: left hip Pain description: inconvience Aggravating factors: prolonged standing or walking Relieving factors: rest, NSAIDS   PRECAUTIONS: Fall   WEIGHT BEARING RESTRICTIONS No   FALLS:  Has patient fallen in last 6 months? No, but has had several near falls   OCCUPATION: retired   PLOF: Independent with basic ADLs   PATIENT GOALS improve walking and balance by reducing foot drop  OBJECTIVE:    DIAGNOSTIC FINDINGS: nothing recent   PATIENT SURVEYS:  FOTO 61% functional intake at eval, goal is 69%   COGNITION:           Overall cognitive status: Within functional limits for tasks assessed                              LOWER EXTREMITY ROM:   Active ROM Left eval Left 11/17/21 Left 12/06/21 Left 01/10/22 Left 01/23/22 Left 02/28/22  Hip flexion         Hip extension         Hip abduction         Hip adduction         Hip internal rotation         Hip external rotation         Knee flexion         Knee extension         Ankle dorsiflexion _0 Ankle plantarflexion WNL     WNL  Ankle inversion WNL     WNL   Ankle eversion _1 (Blank rows = not tested)   LOWER EXTREMITY MMT:   MMT in sitting/supine Left eval Left 12/06/21 Left 01/10/22 Left 01/23/22 Left 02/20/22  Hip flexion 3+ 4     Hip extension        Hip abduction 3 4     Hip adduction        Hip internal rotation        Hip external rotation        Knee flexion        Knee extension        Ankle dorsiflexion 4 4+   4+  Ankle plantarflexion 4 4+   4+  Ankle inversion 5 5     Ankle eversion 2+ 3- 3 3+ 3+   (Blank rows = not tested)   LOWER EXTREMITY SPECIAL TESTS:      FUNCTIONAL TESTS:  10/24/21: 5 times sit to stand: 11.74 seconds without UE support   GAIT: Distance walked: Buyer, retail device utilized: Single point cane Level of assistance: Modified independence Comments: foot drop noted on left with trendelenburg       TODAY'S TREATMENT: 03/24/22 -Recumbent bike L3 X 8 min -Slantboard 30 sec X 3 -Eccentric heel raises on left 2X10 -Heel and toe raises 15 bilat -Tandem balance 30 sec X 2 bilat -SLS balance 10 sec X 5 bilat -DL leg press 125# 2X15, SL 75# 2X10 -ankle rocker board circular X 20 reps A-P, lateral, circles DL, then left leg only EV/INV and SUP/PRO -Stepping over 3 black wedges in bars alternating steps for balance and foot clearance -ankle DF,PF,EV red X 15 each  -E-stim russian/NMES for motor recruitment of peroneals/Tibialis anterior 5/5 sec on/off with intensity to tolerance and active EV/DF contraction/AROM for 5 sec hold during on phase in sidelying X 8 total minutes     03/22/22 -Recumbent bike L3 X 8 min -Slantboard 30 sec X 3 -Eccentric heel raises on left 2X10 -Tandem balance 30 sec X 2 bilat -SLS balance 10 sec X 5 bilat -DL leg press 125# 2X15, SL 75# 2X10 -ankle rocker board 1.5 min A-P, 1.5 min lateral -ankle circles X 15 each way and ankle alphabet with 3# around foot in supine -ankle DF,PF,EV red X 15 each  -  Manual therapy for skilled  palpation and compression with Trigger Point Dry-Needling   Patient Consent Given: Yes Education handout provided: previously provided Muscles treated: left peroneal for muscle activation with Estim frequency 2 and intensity 3-4 to tolerance Treatment response/outcome: twitch response noted bilaterally      PATIENT EDUCATION:  Education details: exam findings and PT plan of care Person educated: Patient Education method: Explanation Education comprehension: verbalized understanding     HOME EXERCISE PROGRAM: Will review his HEP from last episode of care and revise PRN next session       ASSESSMENT:   CLINICAL IMPRESSION: Added stepping over objects today to work on balance and foot clearance. Continued with [place] Estim to assist with active EV and PF movements of left ankle which are still limited.  OBJECTIVE IMPAIRMENTS: decreased activity tolerance, difficulty walking, decreased balance, decreased endurance, decreased mobility, decreased ROM, decreased strength, impaired flexibility, impaired LE use, postural dysfunction, and pain.   ACTIVITY LIMITATIONS: bending, lifting, carry, locomotion, cleaning, community activity, and or occupation   PERSONAL FACTORS: Lt peroneal nerve decompression, foot drop,2020 Colon Cancer, Neuropathy in feet, PVD, prostate cancer, hernia repair are also affecting patient's functional outcome.   REHAB POTENTIAL: Fair     CLINICAL DECISION MAKING: Stable/uncomplicated   EVALUATION COMPLEXITY: Low       GOALS: Short term PT Goals Target date: 03/31/21 Pt will be I and compliant with HEP. Baseline:  Goal status: MET Pt will decrease pain by 25% overall with prolonged standing Baseline: Goal status:MET   Long term PT goals Target date: 05/23/21 Pt will improve left ankle ROM to WFL (10 deg EV and DF) in supine to improve functional mobility Baseline: Goal status: ongong 02/28/22 Pt will improve  hip/knee strength to at least 5-/5 MMT in  sitting, and ankle EV strength to 4/5, ankle DF strength to 4+ to improve functional strength Baseline: Goal status: ongoing 02/28/22 Pt will improve FOTO to at least 69% functional to show improved function Baseline: Goal status: Discontinued, incorrect body part/set up Pt will reduce pain by overall 50% overall with usual activity and prolonged standing Baseline: Goal status: ongoing 02/28/22    PLAN: PT FREQUENCY: 1-2 times per week    PT DURATION: 8-12 weeks   PLANNED INTERVENTIONS (unless contraindicated): aquatic PT, Canalith repositioning, cryotherapy, Electrical stimulation, Iontophoresis with 4 mg/ml dexamethasome, Moist heat, traction, Ultrasound, gait training, Therapeutic exercise, balance training, neuromuscular re-education, patient/family education, prosthetic training, manual techniques, passive ROM, dry needling, taping, vasopnuematic device, vestibular, spinal manipulations, joint manipulations   PLAN FOR NEXT SESSION: DF and EV strength, balance, general leg strength, DN with stim and or [place] for peroneal recuitment    Debbe Odea, PT,DPT 03/24/2022, 8:44 AM

## 2022-03-28 ENCOUNTER — Encounter: Payer: Self-pay | Admitting: Physical Therapy

## 2022-03-28 ENCOUNTER — Ambulatory Visit (INDEPENDENT_AMBULATORY_CARE_PROVIDER_SITE_OTHER): Payer: Medicare Other | Admitting: Physical Therapy

## 2022-03-28 DIAGNOSIS — R262 Difficulty in walking, not elsewhere classified: Secondary | ICD-10-CM

## 2022-03-28 DIAGNOSIS — M25552 Pain in left hip: Secondary | ICD-10-CM

## 2022-03-28 DIAGNOSIS — M6281 Muscle weakness (generalized): Secondary | ICD-10-CM

## 2022-03-28 DIAGNOSIS — M79662 Pain in left lower leg: Secondary | ICD-10-CM

## 2022-03-28 NOTE — Therapy (Signed)
OUTPATIENT PHYSICAL THERAPY TREATMENT     Patient Name: Oscar Sparks MRN: [id number] DOB:1940-10-12, 82 y.o., male Today's Date: 03/28/2022   END OF SESSION:   PT End of Session - 03/28/22 0810     Visit Number 37    Number of Visits 45    Date for PT Re-Evaluation 05/23/22    Authorization Type MCR    Progress Note Due on Visit 64    PT Start Time 0803    PT Stop Time 0845    PT Time Calculation (min) 42 min    Activity Tolerance Patient tolerated treatment well    Behavior During Therapy [facility] for tasks assessed/performed             Past Medical History:  Diagnosis Date   Anemia    Arthritis    knees   Colon cancer ([place]) 09/24/2018   ED (erectile dysfunction)    Inguinal hernia    Neuromuscular disorder (HCC)    neuropathy in feet   Peripheral vascular disease ([place])    poor curculation in hands and feet hard to monitor with pulse oximetry   Prostate cancer ([place])    sees Dr. Gaynelle Arabian, received cesium seeds    Past Surgical History:  Procedure Laterality Date   BACK SURGERY     INGUINAL HERNIA REPAIR Right 05/20/2021   Procedure: OPEN RIGHT INGUINAL HERNIA REPAIR WITH MESH;  Surgeon: Jovita Kussmaul, MD;  Location: [place];  Service: General;  Laterality: Right;   SPINE SURGERY  2004   Dr. 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   PERTINENT HISTORY: Lt peroneal nerve decompression, foot drop,2020 Colon Cancer, Neuropathy in feet, PVD, prostate cancer, hernia repair   PAIN:  Are you having pain? Yes: NPRS scale: 0/10 Pain location: left hip Pain description: inconvience Aggravating factors: prolonged standing or walking Relieving factors: rest, NSAIDS   PRECAUTIONS: Fall   WEIGHT BEARING RESTRICTIONS No   FALLS:  Has patient fallen in last 6 months? No, but has had several near falls   OCCUPATION: retired   PLOF: Independent with basic ADLs   PATIENT GOALS improve walking and balance by reducing foot drop     OBJECTIVE:    DIAGNOSTIC FINDINGS: nothing recent   PATIENT SURVEYS:  FOTO 61% functional  intake at eval, goal is 69%   COGNITION:           Overall cognitive status: Within functional limits for tasks assessed                              LOWER EXTREMITY ROM:   Active ROM Left eval Left 11/17/21 Left 12/06/21 Left 01/10/22 Left 01/23/22 Left 02/28/22  Hip flexion         Hip extension         Hip abduction         Hip adduction         Hip internal rotation         Hip external rotation         Knee flexion         Knee extension         Ankle dorsiflexion _0 Ankle plantarflexion WNL     WNL  Ankle inversion WNL     WNL  Ankle eversion _1 (Blank rows = not tested)   LOWER EXTREMITY MMT:   MMT  in sitting/supine Left eval Left 12/06/21 Left 01/10/22 Left 01/23/22 Left 02/20/22  Hip flexion 3+ 4     Hip extension        Hip abduction 3 4     Hip adduction        Hip internal rotation        Hip external rotation        Knee flexion        Knee extension        Ankle dorsiflexion 4 4+   4+  Ankle plantarflexion 4 4+   4+  Ankle inversion 5 5     Ankle eversion 2+ 3- 3 3+ 3+   (Blank rows = not tested)   LOWER EXTREMITY SPECIAL TESTS:      FUNCTIONAL TESTS:  10/24/21: 5 times sit to stand: 11.74 seconds without UE support  03/28/22: SLS time 2 sec on Left, 10 sec on Rt   GAIT: Distance walked: Buyer, retail device utilized: Single point cane Level of assistance: Modified independence Comments: foot drop noted on left with trendelenburg       TODAY'S TREATMENT: 03/28/22 -Recumbent bike L3 X 8 min -Slantboard 30 sec X 3 -Eccentric heel raises on left 2X10 -Heel and toe raises 15 bilat -Tandem walk 4 round trips in bars -SLS balance 10 sec X 5 bilat -DL leg press 125# 2X15, SL 75# 2X10 -ankle rocker board A-P 1.5 min, lateral 1.5 min -Stepping over 3 black wedges in bars alternating steps for balance and foot clearance -ankle DF,PF,EV red X 15 each  -Manual therapy for skilled palpation and compression with Trigger Point Dry-Needling   Patient Consent Given: Yes Education handout provided: previously provided Muscles treated: left peroneal for muscle activation with Estim frequency 2 and intensity 3-4 to tolerance Treatment response/outcome: twitch response noted bilaterally  03/24/22 -Recumbent bike L3 X 8 min -Slantboard 30 sec X 3 -Eccentric heel raises on left 2X10 -Heel and toe raises 15 bilat -Tandem balance 30 sec X 2 bilat -SLS balance 10 sec X 5 bilat -DL leg press 125# 2X15, SL 75# 2X10 -ankle rocker board circular X 20 reps A-P, lateral, circles DL, then left leg only EV/INV and SUP/PRO -Stepping over 3 black wedges in bars  alternating steps for  balance and foot clearance -ankle DF,PF,EV red X 15 each  -E-stim russian/NMES for motor recruitment of peroneals/Tibialis anterior 5/5 sec on/off with intensity to tolerance and active EV/DF contraction/AROM for 5 sec hold during on phase in sidelying X 8 total minutes       PATIENT EDUCATION:  Education details: exam findings and PT plan of care Person educated: Patient Education method: Explanation Education comprehension: verbalized understanding     HOME EXERCISE PROGRAM: Will review his HEP from last episode of care and revise PRN next session       ASSESSMENT:   CLINICAL IMPRESSION: Continued to work on balance, gait, and ankle strengthening to reduce his risk of falling. PT recommending to continue with current PT plan.    OBJECTIVE IMPAIRMENTS: decreased activity tolerance, difficulty walking, decreased balance, decreased endurance, decreased mobility, decreased ROM, decreased strength, impaired flexibility, impaired LE use, postural dysfunction, and pain.   ACTIVITY LIMITATIONS: bending, lifting, carry, locomotion, cleaning, community activity, and or occupation   PERSONAL FACTORS: Lt peroneal nerve decompression, foot drop,2020 Colon Cancer, Neuropathy in feet, PVD, prostate cancer, hernia repair are also affecting patient's functional outcome.   REHAB POTENTIAL: Fair     CLINICAL DECISION MAKING: Stable/uncomplicated   EVALUATION COMPLEXITY: Low       GOALS: Short term PT Goals Target date: 03/31/21 Pt will be I and compliant with HEP. Baseline:  Goal status: MET Pt will decrease pain by 25% overall with prolonged standing Baseline: Goal status:MET   Long term PT goals Target date: 05/23/21 Pt will improve left ankle ROM to WFL (10 deg EV and DF) in supine to improve functional mobility Baseline: Goal status: ongong 02/28/22 Pt will improve  hip/knee strength to at least 5-/5 MMT in sitting, and ankle EV strength to 4/5, ankle DF  strength to 4+ to improve functional strength Baseline: Goal status: ongoing 02/28/22 Pt will improve FOTO to at least 69% functional to show improved function Baseline: Goal status: Discontinued, incorrect body part/set up Pt will reduce pain by overall 50% overall with usual activity and prolonged standing Baseline: Goal status: ongoing 02/28/22    PLAN: PT FREQUENCY: 1-2 times per week    PT DURATION: 8-12 weeks   PLANNED INTERVENTIONS (unless contraindicated): aquatic PT, Canalith repositioning, cryotherapy, Electrical stimulation, Iontophoresis with 4 mg/ml dexamethasome, Moist heat, traction, Ultrasound, gait training, Therapeutic exercise, balance training, neuromuscular re-education, patient/family education, prosthetic training, manual techniques, passive ROM, dry needling, taping, vasopnuematic device, vestibular, spinal manipulations, joint manipulations   PLAN FOR NEXT SESSION: DF and EV strength, balance, general leg strength, DN with stim and or [place] for peroneal recuitment    Debbe Odea, PT,DPT 03/28/2022, 8:11 AM

## 2022-03-31 ENCOUNTER — Encounter: Payer: Self-pay | Admitting: Physical Therapy

## 2022-03-31 ENCOUNTER — Ambulatory Visit (INDEPENDENT_AMBULATORY_CARE_PROVIDER_SITE_OTHER): Payer: Medicare Other | Admitting: Physical Therapy

## 2022-03-31 DIAGNOSIS — R262 Difficulty in walking, not elsewhere classified: Secondary | ICD-10-CM | POA: Diagnosis not present

## 2022-03-31 DIAGNOSIS — M79662 Pain in left lower leg: Secondary | ICD-10-CM

## 2022-03-31 DIAGNOSIS — M6281 Muscle weakness (generalized): Secondary | ICD-10-CM | POA: Diagnosis not present

## 2022-03-31 DIAGNOSIS — M25552 Pain in left hip: Secondary | ICD-10-CM

## 2022-03-31 NOTE — Therapy (Signed)
OUTPATIENT PHYSICAL THERAPY TREATMENT     Patient Name: Oscar Sparks MRN: [id number] DOB:[birth year], 82 y.o., male Today's Date: 03/31/2022   END OF SESSION:   PT End of Session - 03/31/22 0845     Visit Number 38    Number of Visits 12    Date for PT Re-Evaluation 05/23/22    Authorization Type MCR    Progress Note Due on Visit 85    PT Start Time 0844    PT Stop Time 0929    PT Time Calculation (min) 45 min    Activity Tolerance Patient tolerated treatment well    Behavior During Therapy [facility] for tasks assessed/performed             Past Medical History:  Diagnosis Date   Anemia    Arthritis    knees   Colon cancer ([place]) 09/24/2018   ED (erectile dysfunction)    Inguinal hernia    Neuromuscular disorder (HCC)    neuropathy in feet   Peripheral vascular disease ([place])    poor curculation in hands and feet hard to monitor with pulse oximetry   Prostate cancer ([place])    sees Dr. Gaynelle Arabian, received cesium seeds    Past Surgical History:  Procedure Laterality Date   BACK SURGERY     INGUINAL HERNIA REPAIR Right 05/20/2021   Procedure: OPEN RIGHT INGUINAL HERNIA REPAIR WITH MESH;  Surgeon: Jovita Kussmaul, MD;  Location: [place];  Service: General;  Laterality: Right;   SPINE SURGERY  2004   Dr. 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 Says his wife has noticed he was walking better overall in the garden.   PERTINENT HISTORY: Lt peroneal nerve decompression, foot drop,2020 Colon Cancer, Neuropathy in feet, PVD, prostate cancer, hernia repair   PAIN:  Are you having pain? Yes: NPRS scale: 0/10 Pain location: left hip Pain description: inconvience Aggravating factors: prolonged standing or walking Relieving factors: rest, NSAIDS   PRECAUTIONS: Fall   WEIGHT BEARING RESTRICTIONS No   FALLS:  Has patient fallen in last 6 months? No, but has had several near falls   OCCUPATION: retired   PLOF: Independent with basic ADLs   PATIENT GOALS improve walking and balance by reducing foot drop     OBJECTIVE:  DIAGNOSTIC FINDINGS: nothing recent   PATIENT SURVEYS:  FOTO 61% functional intake at eval, goal is 69%   COGNITION:           Overall cognitive status: Within functional limits for tasks assessed                              LOWER EXTREMITY ROM:   Active ROM Left eval Left 11/17/21 Left 12/06/21 Left 01/10/22 Left 01/23/22 Left 02/28/22  Hip flexion         Hip extension         Hip abduction         Hip adduction         Hip internal rotation         Hip external rotation         Knee flexion         Knee extension         Ankle dorsiflexion '5 7 7 7 8 10  '$ Ankle plantarflexion WNL     WNL  Ankle inversion WNL     WNL  Ankle eversion '5 10 10  10 10 12   '$ (Blank rows = not tested)   LOWER EXTREMITY MMT:   MMT in sitting/supine Left eval Left 12/06/21 Left 01/10/22 Left 01/23/22 Left 02/20/22  Hip flexion 3+ 4     Hip extension        Hip abduction 3 4     Hip adduction        Hip internal rotation        Hip external rotation        Knee flexion        Knee extension        Ankle dorsiflexion 4 4+   4+  Ankle plantarflexion 4 4+   4+  Ankle inversion 5 5     Ankle eversion 2+ 3- 3 3+ 3+   (Blank rows = not tested)   LOWER EXTREMITY SPECIAL TESTS:      FUNCTIONAL TESTS:  10/24/21: 5 times sit to stand: 11.74 seconds without UE support  03/28/22: SLS time 2 sec on Left, 10 sec on Rt   GAIT: Distance walked: Buyer, retail device utilized: Single point cane Level of assistance: Modified independence Comments: foot drop noted on left with trendelenburg       TODAY'S TREATMENT: 03/31/22 -Recumbent bike L4 X 8 min -Slantboard 30 sec X 3 -Eccentric heel raises on left 2X10 -Heel and toe raises 15 bilat -Tandem balance 30 sec X 2 bilat -SLS balance 10 sec X 5 bilat -left leg hip hike X10 -DL leg press 125# 2X15, SL 75# 2X10 -ankle rocker board 30 reps A-P, 30 reps lateral -Stepping  -ankle DF,PF,EV red X 15 each  -E-stim russian/NMES for motor recruitment of peroneals/Tibialis anterior 5/5 sec on/off with intensity to tolerance and active EV/DF contraction/AROM for 5 sec hold during on phase in sidelying X 8 total minutes   03/28/22 -Recumbent bike L3 X 8 min -Slantboard 30 sec X 3 -Eccentric heel raises on left 2X10 -Heel and toe raises 15 bilat -Tandem walk 4 round trips in bars -SLS balance 10 sec X 5 bilat -DL leg press 125# 2X15, SL 75# 2X10 -ankle rocker board A-P 1.5 min, lateral 1.5 min -Stepping over 3 black wedges in bars alternating steps for balance and foot clearance -ankle DF,PF,EV red X 15 each  -Manual therapy for skilled palpation  and compression with Trigger Point  Dry-Needling   Patient Consent Given: Yes Education handout provided: previously provided Muscles treated: left peroneal for muscle activation with Estim frequency 2 and intensity 3-4 to tolerance Treatment response/outcome: twitch response noted bilaterally     PATIENT EDUCATION:  Education details: exam findings and PT plan of care Person educated: Patient Education method: Explanation Education comprehension: verbalized understanding     HOME EXERCISE PROGRAM: Will review his HEP from last episode of care and revise PRN next session       ASSESSMENT:   CLINICAL IMPRESSION: Continued to work on balance, gait, and strengthening for left ankle and hip as he continues to have significant weakness noted.  PT will update measurements for progress note next visit.   OBJECTIVE IMPAIRMENTS: decreased activity tolerance, difficulty walking, decreased balance, decreased endurance, decreased mobility, decreased ROM, decreased strength, impaired flexibility, impaired LE use, postural dysfunction, and pain.   ACTIVITY LIMITATIONS: bending, lifting, carry, locomotion, cleaning, community activity, and or occupation   PERSONAL FACTORS: Lt peroneal nerve decompression, foot drop,2020 Colon Cancer, Neuropathy in feet, PVD, prostate cancer, hernia repair are also affecting patient's functional outcome.   REHAB POTENTIAL: Fair     CLINICAL DECISION MAKING: Stable/uncomplicated   EVALUATION COMPLEXITY: Low       GOALS: Short term PT Goals Target date: 03/31/21 Pt will be I and compliant with HEP. Baseline:  Goal status: MET Pt will decrease pain by 25% overall with prolonged standing Baseline: Goal status:MET   Long term PT goals Target date: 05/23/21 Pt will improve left ankle ROM to WFL (10 deg EV and DF) in supine to improve functional mobility Baseline: Goal status: ongong 02/28/22 Pt will improve  hip/knee strength to at least 5-/5 MMT in sitting, and ankle EV strength to 4/5, ankle  DF strength to 4+ to improve functional strength Baseline: Goal status: ongoing 02/28/22 Pt will improve FOTO to at least 69% functional to show improved function Baseline: Goal status: Discontinued, incorrect body part/set up Pt will reduce pain by overall 50% overall with usual activity and prolonged standing Baseline: Goal status: ongoing 02/28/22    PLAN: PT FREQUENCY: 1-2 times per week    PT DURATION: 8-12 weeks   PLANNED INTERVENTIONS (unless contraindicated): aquatic PT, Canalith repositioning, cryotherapy, Electrical stimulation, Iontophoresis with 4 mg/ml dexamethasome, Moist heat, traction, Ultrasound, gait training, Therapeutic exercise, balance training, neuromuscular re-education, patient/family education, prosthetic training, manual techniques, passive ROM, dry needling, taping, vasopnuematic device, vestibular, spinal manipulations, joint manipulations   PLAN FOR NEXT SESSION: DF and EV strength, balance, general leg strength, DN with stim and or [place] for peroneal recuitment    Debbe Odea, PT,DPT 03/31/2022, 8:46 AM

## 2022-04-04 ENCOUNTER — Encounter: Payer: Self-pay | Admitting: Physical Therapy

## 2022-04-04 ENCOUNTER — Ambulatory Visit (INDEPENDENT_AMBULATORY_CARE_PROVIDER_SITE_OTHER): Payer: Medicare Other | Admitting: Physical Therapy

## 2022-04-04 DIAGNOSIS — M6281 Muscle weakness (generalized): Secondary | ICD-10-CM | POA: Diagnosis not present

## 2022-04-04 DIAGNOSIS — R262 Difficulty in walking, not elsewhere classified: Secondary | ICD-10-CM | POA: Diagnosis not present

## 2022-04-04 DIAGNOSIS — M25552 Pain in left hip: Secondary | ICD-10-CM | POA: Diagnosis not present

## 2022-04-04 DIAGNOSIS — M79662 Pain in left lower leg: Secondary | ICD-10-CM

## 2022-04-04 NOTE — Therapy (Signed)
OUTPATIENT PHYSICAL THERAPY TREATMENT  Progress Note reporting period 02/28/22  to 04/04/22  See below for objective and subjective measurements relating to patients progress with PT.    Patient Name: Oscar Sparks MRN: [id number] DOB:1941/01/28, 82 y.o., male Today's Date: 04/04/2022   END OF SESSION:   PT End of Session - 04/04/22 0806     Visit Number 39    Number of Visits 45    Date for PT Re-Evaluation 05/23/22    Authorization Type MCR    Progress Note Due on Visit 86    PT Start Time 0801    PT Stop Time 0845    PT Time Calculation (min) 44 min    Activity Tolerance Patient tolerated treatment well    Behavior During Therapy [facility] for tasks assessed/performed             Past Medical History:  Diagnosis Date   Anemia    Arthritis    knees   Colon cancer ([place]) 09/24/2018   ED (erectile dysfunction)    Inguinal hernia    Neuromuscular disorder (HCC)    neuropathy in feet   Peripheral vascular disease ([place])    poor curculation in hands and feet hard to monitor with pulse oximetry   Prostate cancer ([place])    sees Dr. Gaynelle Arabian, received cesium seeds    Past Surgical History:  Procedure Laterality Date   BACK SURGERY     INGUINAL HERNIA REPAIR Right 05/20/2021   Procedure: OPEN RIGHT INGUINAL HERNIA REPAIR WITH MESH;  Surgeon: Jovita Kussmaul, MD;  Location: [place];  Service: General;  Laterality: Right;   SPINE SURGERY  2004   Dr. 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    PERTINENT HISTORY: Lt peroneal nerve decompression, foot drop,2020 Colon Cancer, Neuropathy in feet, PVD, prostate cancer, hernia repair   PAIN:  Are you having pain? Yes: NPRS scale: 0/10 Pain location: left hip Pain description: inconvience Aggravating factors: prolonged standing or walking Relieving factors: rest, NSAIDS   PRECAUTIONS: Fall   WEIGHT BEARING RESTRICTIONS No   FALLS:  Has patient fallen in last 6 months? No,  but has had several near falls   OCCUPATION: retired   PLOF: Independent with basic ADLs   PATIENT GOALS improve walking and balance by reducing foot drop     OBJECTIVE:    DIAGNOSTIC FINDINGS: nothing recent   PATIENT SURVEYS:  FOTO 61% functional intake at eval, goal is 69%   COGNITION:           Overall cognitive status: Within functional limits for tasks assessed                              LOWER EXTREMITY ROM:   Active ROM Left eval Left 11/17/21 Left 12/06/21 Left 01/10/22 Left 01/23/22 Left 02/28/22 Left 04/04/22  Hip flexion          Hip extension          Hip abduction          Hip adduction          Hip internal  rotation          Hip external rotation          Knee flexion          Knee extension          Ankle dorsiflexion '5 7 7 7 8 10 10  '$ Ankle plantarflexion WNL     WNL WNL  Ankle inversion WNL     WNL WNL  Ankle eversion '5 10 10 10 10 12 13   '$ (Blank rows = not tested)   LOWER EXTREMITY MMT:   MMT in sitting/supine Left eval Left 12/06/21 Left 01/10/22 Left 01/23/22 Left 02/20/22 Left 04/04/22  Hip flexion 3+ 4      Hip extension         Hip abduction 3 4      Hip adduction         Hip internal rotation         Hip external rotation         Knee flexion         Knee extension         Ankle dorsiflexion 4 4+   4+ 5  Ankle plantarflexion 4 4+   4+ 4+  Ankle inversion 5 5      Ankle eversion 2+ 3- 3 3+ 3+ 4   (Blank rows = not tested)   LOWER EXTREMITY SPECIAL TESTS:      FUNCTIONAL TESTS:  10/24/21: 5 times sit to stand: 11.74 seconds without UE support  03/28/22: SLS time 2 sec on Left, 10 sec on Rt   GAIT: Distance walked: Buyer, retail device utilized: Single point cane Level of assistance: Modified independence Comments: foot drop noted on left with trendelenburg       TODAY'S TREATMENT: 04/04/22 -Recumbent bike L5 X 8 min -Slantboard 30 sec X 3 -Heel and toe raises 2 X15 bilat -Tandem walk 3 round trips in bars -SLS balance max time he is able to perform, X 5 bilat -Sidestepping in bars 3 round trips with red band -ankle rocker board A-P 1.5 min, lateral 1.5 min -Stepping over 3 black wedges in bars alternating steps for balance and foot clearance -ankle DF,PF,EV red X 20 each -ankle circles  X15 and alphabet X 1 with 3#  -Manual therapy for skilled palpation and compression with Trigger Point Dry-Needling   Patient Consent Given: Yes Education handout provided: previously provided Muscles treated: left peroneal for muscle activation with Estim frequency  2 and intensity 3-4 to tolerance Treatment response/outcome: twitch response noted  bilaterally  03/31/22 -Recumbent bike L4 X 8 min -Slantboard 30 sec X 3 -Eccentric heel raises on left 2X10 -Heel and toe raises 15 bilat -Tandem balance 30 sec X 2 bilat -SLS balance 10 sec X 5 bilat -left leg hip hike X10 -DL leg press 125# 2X15, SL 75# 2X10 -ankle rocker board 30 reps A-P, 30 reps lateral -Stepping  -ankle DF,PF,EV red X 15 each  -E-stim russian/NMES for motor recruitment of peroneals/Tibialis anterior 5/5 sec on/off with intensity to tolerance and active EV/DF contraction/AROM for 5 sec hold during on phase in sidelying X 8 total minutes      PATIENT EDUCATION:  Education details: exam findings and PT plan of care Person educated: Patient Education method: Explanation Education comprehension: verbalized understanding     HOME EXERCISE PROGRAM: Will review his HEP from last episode of care and revise PRN next session       ASSESSMENT:   CLINICAL IMPRESSION: Progress note reflects some improvements in his left ankle EV ROM and strength as well as DF strength. He still has limitations in SLS balance but overall has made significant progress with PT. We will plan to see him up to 6 more times to maximize his function and transition to independent program.   OBJECTIVE IMPAIRMENTS: decreased activity tolerance, difficulty walking, decreased balance, decreased endurance, decreased mobility, decreased ROM, decreased strength, impaired flexibility, impaired LE use, postural dysfunction, and pain.   ACTIVITY LIMITATIONS: bending, lifting, carry, locomotion, cleaning, community activity, and or occupation   PERSONAL FACTORS: Lt peroneal nerve decompression, foot drop,2020 Colon Cancer, Neuropathy in feet, PVD, prostate cancer, hernia repair are also affecting patient's functional outcome.   REHAB POTENTIAL: Fair     CLINICAL DECISION MAKING: Stable/uncomplicated   EVALUATION COMPLEXITY: Low       GOALS: Short term PT Goals Target date: 03/31/21 Pt will be I  and compliant with HEP. Baseline:  Goal status: MET Pt will decrease pain by 25% overall with prolonged standing Baseline: Goal status:MET   Long term PT goals Target date: 05/23/21 Pt will improve left ankle ROM to WFL (10 deg EV and DF) in supine to improve functional mobility Baseline: Goal status: MET 04/04/22 Pt will improve  hip/knee strength to at least 5-/5 MMT in sitting, and ankle EV strength to 4/5, ankle DF strength to 4+ to improve functional strength Baseline: Goal status: MET  04/04/22 Pt will improve FOTO to at least 69% functional to show improved function Baseline: Goal status: Discontinued, incorrect body part/set up Pt will reduce pain by overall 50% overall with usual activity and prolonged standing Baseline: Goal status: MET 04/04/22    PLAN: PT FREQUENCY: 1-2 times per week    PT DURATION: 8-12 weeks   PLANNED INTERVENTIONS (unless contraindicated): aquatic PT, Canalith repositioning, cryotherapy, Electrical stimulation, Iontophoresis with 4 mg/ml dexamethasome, Moist heat, traction, Ultrasound, gait training, Therapeutic exercise, balance training, neuromuscular re-education, patient/family education, prosthetic training, manual techniques, passive ROM, dry needling, taping, vasopnuematic device, vestibular, spinal manipulations, joint manipulations   PLAN FOR NEXT SESSION: DF and EV strength, balance, general leg strength, DN with stim and or [place] for peroneal recuitment    Debbe Odea, PT,DPT 04/04/2022, 8:51 AM

## 2022-04-07 ENCOUNTER — Ambulatory Visit (INDEPENDENT_AMBULATORY_CARE_PROVIDER_SITE_OTHER): Payer: Medicare Other | Admitting: Physical Therapy

## 2022-04-07 ENCOUNTER — Encounter: Payer: Self-pay | Admitting: Physical Therapy

## 2022-04-07 DIAGNOSIS — R262 Difficulty in walking, not elsewhere classified: Secondary | ICD-10-CM

## 2022-04-07 DIAGNOSIS — M79662 Pain in left lower leg: Secondary | ICD-10-CM

## 2022-04-07 DIAGNOSIS — M25552 Pain in left hip: Secondary | ICD-10-CM | POA: Diagnosis not present

## 2022-04-07 DIAGNOSIS — M6281 Muscle weakness (generalized): Secondary | ICD-10-CM | POA: Diagnosis not present

## 2022-04-07 NOTE — Therapy (Signed)
OUTPATIENT PHYSICAL THERAPY TREATMENT    Patient Name: ELISEO WITHERS MRN: [id number] DOB:[birth year], 82 y.o., male Today's Date: 04/07/2022   END OF SESSION:   PT End of Session - 04/07/22 0817     Visit Number 40    Number of Visits 45    Date for PT Re-Evaluation 05/23/22    Authorization Type MCR    Progress Note Due on Visit 47    PT Start Time 0804    PT Stop Time 0845    PT Time Calculation (min) 41 min    Activity Tolerance Patient tolerated treatment well    Behavior During Therapy [facility] for tasks assessed/performed              Past Medical History:  Diagnosis Date   Anemia    Arthritis    knees   Colon cancer ([place]) 09/24/2018   ED (erectile dysfunction)    Inguinal hernia    Neuromuscular disorder (HCC)    neuropathy in feet   Peripheral vascular disease ([place])    poor curculation in hands and feet hard to monitor with pulse oximetry   Prostate cancer ([place])    sees Dr. Gaynelle Arabian, received cesium seeds    Past Surgical History:  Procedure Laterality Date   BACK SURGERY     INGUINAL HERNIA REPAIR Right 05/20/2021   Procedure: OPEN RIGHT INGUINAL HERNIA REPAIR WITH MESH;  Surgeon: Jovita Kussmaul, MD;  Location: [place];  Service: General;  Laterality: Right;   SPINE SURGERY  2004   Dr. 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 Yes: NPRS scale: 0/10 Pain location: left hip Pain description: inconvience Aggravating factors: prolonged standing or walking Relieving factors: rest, NSAIDS   PRECAUTIONS: Fall   WEIGHT BEARING RESTRICTIONS No   FALLS:  Has patient fallen in last 6 months? No, but has had several near falls   OCCUPATION: retired   PLOF: Independent with basic ADLs   PATIENT GOALS improve walking and balance by reducing foot drop     OBJECTIVE:    DIAGNOSTIC FINDINGS: nothing recent   PATIENT SURVEYS:  FOTO 61%  functional intake at eval, goal is 69%   COGNITION:           Overall cognitive status: Within functional limits for tasks assessed                              LOWER EXTREMITY ROM:   Active ROM Left eval Left 11/17/21 Left 12/06/21 Left 01/10/22 Left 01/23/22 Left 02/28/22 Left 04/04/22  Hip flexion          Hip extension          Hip abduction          Hip adduction          Hip internal rotation          Hip external rotation          Knee flexion          Knee extension          Ankle dorsiflexion '5 7 7 7 8 10 10  '$ Ankle plantarflexion WNL     WNL WNL  Ankle inversion WNL     WNL WNL  Ankle eversion '5 10 10 10 10 12 13   '$ (Blank rows  = not tested)   LOWER EXTREMITY MMT:   MMT in sitting/supine Left eval Left 12/06/21 Left 01/10/22 Left 01/23/22 Left 02/20/22 Left 04/04/22  Hip flexion 3+ 4      Hip extension         Hip abduction 3 4      Hip adduction         Hip internal rotation         Hip external rotation         Knee flexion         Knee extension         Ankle dorsiflexion 4 4+   4+ 5  Ankle plantarflexion 4 4+   4+ 4+  Ankle inversion 5 5      Ankle eversion 2+ 3- 3 3+ 3+ 4   (Blank rows = not tested)   LOWER EXTREMITY SPECIAL TESTS:      FUNCTIONAL TESTS:  10/24/21: 5 times sit to stand: 11.74 seconds without UE support  03/28/22: SLS time 2 sec on Left, 10 sec on Rt   GAIT: Distance walked: Buyer, retail device utilized: Single point cane Level of assistance: Modified independence Comments: foot drop noted on left with trendelenburg       TODAY'S TREATMENT: 04/07/22 -Recumbent bike L5 X 8 min -Slantboard 30 sec X 3 -Eccentric heel raises on left 2X15 -Tandem balance 30 sec X 2 bilat -SLS balance 10 sec X 5 bilat -Sit to stands with 15# KB X 15 -Deadlifts floor to waist 15# KB X 15 -Side Stepping and diagonal stepping with red band around ankles 3 round trips at counter top -ankle DF,PF,EV red X 15 each -ankle circles 3# X 15, alphabet  X1  -E-stim russian/NMES for motor recruitment of peroneals/Tibialis anterior 5/5 sec on/off with intensity to tolerance and active EV/DF contraction/AROM for 5 sec hold during on phase in sidelying X 8 total minutes   04/04/22 -Recumbent bike seat #9, L5 X 8 min -Slantboard 30 sec X 3 -Heel and toe raises 2 X15 bilat -Tandem walk 3 round trips in bars -SLS balance max time he is able to perform, X 5 bilat -Sidestepping in bars 3 round trips with red band -ankle rocker board A-P 1.5  min, lateral 1.5 min -Stepping over 3 black wedges in bars alternating steps for balance and foot clearance -ankle DF,PF,EV red X 20 each -ankle  circles  X15 and alphabet X 1 with 3#  -Manual therapy for skilled palpation and compression with Trigger Point Dry-Needling   Patient Consent Given: Yes Education handout provided: previously provided Muscles treated: left peroneal for muscle activation with Estim frequency 2 and intensity 3-4 to tolerance Treatment response/outcome: twitch response noted bilaterally       PATIENT EDUCATION:  Education details: exam findings and PT plan of care Person educated: Patient Education method: Explanation Education comprehension: verbalized understanding     HOME EXERCISE PROGRAM: Will review his HEP from last episode of care and revise PRN next session       ASSESSMENT:   CLINICAL IMPRESSION: He has knee valgus noted with lifting from floor likely due to his hip weakness so we performed more hip strengthening today. Used NMES russian stimulation to continue to help facilitate eversion strength.   OBJECTIVE IMPAIRMENTS: decreased activity tolerance, difficulty walking, decreased balance, decreased endurance, decreased mobility, decreased ROM, decreased strength, impaired flexibility, impaired LE use, postural dysfunction, and pain.   ACTIVITY LIMITATIONS: bending, lifting, carry, locomotion, cleaning, community activity, and or occupation   PERSONAL FACTORS: Lt peroneal nerve decompression, foot drop,2020 Colon Cancer, Neuropathy in feet, PVD, prostate cancer, hernia repair are also affecting patient's functional outcome.   REHAB POTENTIAL: Fair     CLINICAL DECISION MAKING: Stable/uncomplicated   EVALUATION COMPLEXITY: Low       GOALS: Short term PT Goals Target date: 03/31/21 Pt will be I and compliant with HEP. Baseline:  Goal status: MET Pt will decrease pain by 25% overall with prolonged standing Baseline: Goal status:MET   Long term PT goals Target date: 05/23/21 Pt will improve left ankle ROM to WFL (10 deg EV and DF) in supine to improve functional  mobility Baseline: Goal status: MET 04/04/22 Pt will improve  hip/knee strength to at least 5-/5 MMT in sitting, and ankle EV strength to 4/5, ankle DF strength to 4+ to improve functional strength Baseline: Goal status: MET  04/04/22 Pt will improve FOTO to at least 69% functional to show improved function Baseline: Goal status: Discontinued, incorrect body part/set up Pt will reduce pain by overall 50% overall with usual activity and prolonged standing Baseline: Goal status: MET 04/04/22    PLAN: PT FREQUENCY: 1-2 times per week    PT DURATION: 8-12 weeks   PLANNED INTERVENTIONS (unless contraindicated): aquatic PT, Canalith repositioning, cryotherapy, Electrical stimulation, Iontophoresis with 4 mg/ml dexamethasome, Moist heat, traction, Ultrasound, gait training, Therapeutic exercise, balance training, neuromuscular re-education, patient/family education, prosthetic training, manual techniques, passive ROM, dry needling, taping, vasopnuematic device, vestibular, spinal manipulations, joint manipulations   PLAN FOR NEXT SESSION: DF and EV strength, balance, general leg strength, DN with stim and or [place] for peroneal recuitment    Debbe Odea, PT,DPT 04/07/2022, 8:18 AM

## 2022-04-11 ENCOUNTER — Encounter: Payer: Self-pay | Admitting: Physical Therapy

## 2022-04-11 ENCOUNTER — Ambulatory Visit (INDEPENDENT_AMBULATORY_CARE_PROVIDER_SITE_OTHER): Payer: Medicare Other | Admitting: Physical Therapy

## 2022-04-11 DIAGNOSIS — M25552 Pain in left hip: Secondary | ICD-10-CM

## 2022-04-11 DIAGNOSIS — R262 Difficulty in walking, not elsewhere classified: Secondary | ICD-10-CM

## 2022-04-11 DIAGNOSIS — M79662 Pain in left lower leg: Secondary | ICD-10-CM | POA: Diagnosis not present

## 2022-04-11 DIAGNOSIS — M6281 Muscle weakness (generalized): Secondary | ICD-10-CM

## 2022-04-11 NOTE — Therapy (Signed)
OUTPATIENT PHYSICAL THERAPY TREATMENT    Patient Name: Oscar Sparks MRN: [id number] DOB:04-Nov-1940, 82 y.o., male Today's Date: 04/11/2022   END OF SESSION:   PT End of Session - 04/11/22 0805     Visit Number 41    Number of Visits 29    Date for PT Re-Evaluation 05/23/22    Authorization Type MCR    Progress Note Due on Visit 27    PT Start Time 0802    PT Stop Time 0845    PT Time Calculation (min) 43 min    Activity Tolerance Patient tolerated treatment well    Behavior During Therapy [place] for tasks assessed/performed              Past Medical History:  Diagnosis Date   Anemia    Arthritis    knees   Colon cancer ([place]) 09/24/2018   ED (erectile dysfunction)    Inguinal hernia    Neuromuscular disorder (HCC)    neuropathy in feet   Peripheral vascular disease ([place])    poor curculation in hands and feet hard to monitor with pulse oximetry   Prostate cancer ([place])    sees Dr. Gaynelle Arabian, received cesium seeds    Past Surgical History:  Procedure Laterality Date   BACK SURGERY     INGUINAL HERNIA REPAIR Right 05/20/2021   Procedure: OPEN RIGHT INGUINAL HERNIA REPAIR WITH MESH;  Surgeon: Jovita Kussmaul, MD;  Location: [place];  Service: General;  Laterality: Right;   SPINE SURGERY  2004   Dr. 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 Yes: NPRS scale: 0/10 Pain location: left hip Pain description: inconvience Aggravating factors: prolonged standing or walking Relieving factors: rest, NSAIDS   PRECAUTIONS: Fall   WEIGHT BEARING RESTRICTIONS No   FALLS:  Has patient fallen in last 6 months? No, but has had several near falls   OCCUPATION: retired   PLOF: Independent with basic ADLs   PATIENT GOALS improve walking and balance by reducing foot drop     OBJECTIVE:    DIAGNOSTIC FINDINGS: nothing recent   PATIENT SURVEYS:  FOTO 61%  functional intake at eval, goal is 69%   COGNITION:           Overall cognitive status: Within functional limits for tasks assessed                              LOWER EXTREMITY ROM:   Active ROM Left eval Left 11/17/21 Left 12/06/21 Left 01/10/22 Left 01/23/22 Left 02/28/22 Left 04/04/22  Hip flexion          Hip extension          Hip abduction          Hip adduction          Hip internal rotation          Hip external rotation          Knee flexion          Knee extension          Ankle dorsiflexion '5 7 7 7 8 10 10  '$ Ankle plantarflexion WNL     WNL WNL  Ankle inversion WNL     WNL WNL  Ankle eversion '5 10 10 10 10 12 13   '$ (Blank rows  = not tested)   LOWER EXTREMITY MMT:   MMT in sitting/supine Left eval Left 12/06/21 Left 01/10/22 Left 01/23/22 Left 02/20/22 Left 04/04/22  Hip flexion 3+ 4      Hip extension         Hip abduction 3 4      Hip adduction         Hip internal rotation         Hip external rotation         Knee flexion         Knee extension         Ankle dorsiflexion 4 4+   4+ 5  Ankle plantarflexion 4 4+   4+ 4+  Ankle inversion 5 5      Ankle eversion 2+ 3- 3 3+ 3+ 4   (Blank rows = not tested)   LOWER EXTREMITY SPECIAL TESTS:      FUNCTIONAL TESTS:  10/24/21: 5 times sit to stand: 11.74 seconds without UE support  03/28/22: SLS time 2 sec on Left, 10 sec on Rt   GAIT: Distance walked: Buyer, retail device utilized: Single point cane Level of assistance: Modified independence Comments: foot drop noted on left with trendelenburg       TODAY'S TREATMENT: 04/11/22 -Recumbent bike seat #9, L5 X 8 min -Slantboard 30 sec X 3 -Heel raises eccentric for left 3 X10 -Tandem balance 30 sec X 3 -SLS balance max time he is able to perform, X 5 bilat -Sidestepping in bars and diagonal walks in bars 3 round trips each with red band -ankle rocker board A-P 1.5 min, lateral 1.5 min -Sit to stands with 15# KB 2X10 -Stepping over 3 black wedges in bars alternating steps for balance and foot clearance -ankle DF,PF,EV red X 20 each   -Manual therapy for skilled palpation and compression with Trigger Point Dry-Needling   Patient Consent Given: Yes Education handout provided: previously provided Muscles treated: left peroneal for muscle activation with Estim frequency 2 and intensity 3-4 to tolerance Treatment response/outcome: twitch response noted bilaterally  04/07/22 -Recumbent bike L5 X 8 min -Slantboard 30 sec X 3 -Eccentric heel raises on left 2X15 -Tandem balance 30 sec X 2 bilat -SLS balance 10 sec X  5 bilat -Sit to stands with 15# KB X 15 -Deadlifts floor to waist 15#  KB X 15 -Side Stepping and diagonal stepping with red band around ankles 3 round trips at counter top -ankle DF,PF,EV red X 15 each -ankle circles 3# X 15, alphabet  X1  -E-stim russian/NMES for motor recruitment of peroneals/Tibialis anterior 5/5 sec on/off with intensity to tolerance and active EV/DF contraction/AROM for 5 sec hold during on phase in sidelying X 8 total minutes     PATIENT EDUCATION:  Education details: exam findings and PT plan of care Person educated: Patient Education method: Explanation Education comprehension: verbalized understanding     HOME EXERCISE PROGRAM: Will review his HEP from last episode of care and revise PRN next session       ASSESSMENT:   CLINICAL IMPRESSION: He had better overall contraction of foot peroneals noted today with DN. He will continue to benefit from skilled PT for ankle/knee/hip strengthening and balance.    OBJECTIVE IMPAIRMENTS: decreased activity tolerance, difficulty walking, decreased balance, decreased endurance, decreased mobility, decreased ROM, decreased strength, impaired flexibility, impaired LE use, postural dysfunction, and pain.   ACTIVITY LIMITATIONS: bending, lifting, carry, locomotion, cleaning, community activity, and or occupation   PERSONAL FACTORS: Lt peroneal nerve decompression, foot drop,2020 Colon Cancer, Neuropathy in feet, PVD, prostate cancer, hernia repair are also affecting patient's functional outcome.   REHAB POTENTIAL: Fair     CLINICAL DECISION MAKING: Stable/uncomplicated   EVALUATION COMPLEXITY: Low       GOALS: Short term PT Goals Target date: 03/31/21 Pt will be I and compliant with HEP. Baseline:  Goal status: MET Pt will decrease pain by 25% overall with prolonged standing Baseline: Goal status:MET   Long term PT goals Target date: 05/23/21 Pt will improve left ankle ROM to WFL (10 deg EV and DF) in supine to improve functional mobility Baseline: Goal status: MET 04/04/22 Pt  will improve  hip/knee strength to at least 5-/5 MMT in sitting, and ankle EV strength to 4/5, ankle DF strength to 4+ to improve functional strength Baseline: Goal status: MET  04/04/22 Pt will improve FOTO to at least 69% functional to show improved function Baseline: Goal status: Discontinued, incorrect body part/set up Pt will reduce pain by overall 50% overall with usual activity and prolonged standing Baseline: Goal status: MET 04/04/22    PLAN: PT FREQUENCY: 1-2 times per week    PT DURATION: 8-12 weeks   PLANNED INTERVENTIONS (unless contraindicated): aquatic PT, Canalith repositioning, cryotherapy, Electrical stimulation, Iontophoresis with 4 mg/ml dexamethasome, Moist heat, traction, Ultrasound, gait training, Therapeutic exercise, balance training, neuromuscular re-education, patient/family education, prosthetic training, manual techniques, passive ROM, dry needling, taping, vasopnuematic device, vestibular, spinal manipulations, joint manipulations   PLAN FOR NEXT SESSION: DF and EV strength, balance, general leg strength, DN with stim and or [place] for peroneal recuitment    Debbe Odea, PT,DPT 04/11/2022, 8:10 AM

## 2022-04-13 ENCOUNTER — Encounter: Payer: Self-pay | Admitting: Physical Therapy

## 2022-04-13 ENCOUNTER — Ambulatory Visit (INDEPENDENT_AMBULATORY_CARE_PROVIDER_SITE_OTHER): Payer: Medicare Other | Admitting: Physical Therapy

## 2022-04-13 DIAGNOSIS — M6281 Muscle weakness (generalized): Secondary | ICD-10-CM

## 2022-04-13 DIAGNOSIS — R262 Difficulty in walking, not elsewhere classified: Secondary | ICD-10-CM

## 2022-04-13 DIAGNOSIS — M25552 Pain in left hip: Secondary | ICD-10-CM

## 2022-04-13 DIAGNOSIS — M79662 Pain in left lower leg: Secondary | ICD-10-CM | POA: Diagnosis not present

## 2022-04-13 NOTE — Therapy (Signed)
OUTPATIENT PHYSICAL THERAPY TREATMENT    Patient Name: LAVI SHEEHAN MRN: [id number] DOB:10-16-1940, 82 y.o., male Today's Date: 04/13/2022   END OF SESSION:   PT End of Session - 04/13/22 0852     Visit Number 42    Number of Visits 45    Date for PT Re-Evaluation 05/23/22    Authorization Type MCR    Progress Note Due on Visit 65    PT Start Time 0846    PT Stop Time 0930    PT Time Calculation (min) 44 min    Activity Tolerance Patient tolerated treatment well    Behavior During Therapy [facility] for tasks assessed/performed              Past Medical History:  Diagnosis Date   Anemia    Arthritis    knees   Colon cancer ([place]) 09/24/2018   ED (erectile dysfunction)    Inguinal hernia    Neuromuscular disorder (HCC)    neuropathy in feet   Peripheral vascular disease (HCC)    poor curculation in hands and feet hard to monitor with pulse oximetry   Prostate cancer ([place])    sees Dr. Gaynelle Arabian, received cesium seeds    Past Surgical History:  Procedure Laterality Date   BACK SURGERY     INGUINAL HERNIA REPAIR Right 05/20/2021   Procedure: OPEN RIGHT INGUINAL HERNIA REPAIR WITH MESH;  Surgeon: Jovita Kussmaul, MD;  Location: [place];  Service: General;  Laterality: Right;   SPINE SURGERY  2004   Dr. 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 Yes: NPRS scale: 0/10 Pain location: left hip Pain description: inconvience Aggravating factors: prolonged standing or walking Relieving factors: rest, NSAIDS   PRECAUTIONS: Fall   WEIGHT BEARING RESTRICTIONS No   FALLS:  Has patient fallen in last 6 months? No, but has had several near falls   OCCUPATION: retired   PLOF: Independent with basic ADLs   PATIENT GOALS improve walking and balance by reducing foot drop     OBJECTIVE:    DIAGNOSTIC FINDINGS: nothing recent   PATIENT SURVEYS:  FOTO 61% functional intake  at eval, goal is 69%   COGNITION:           Overall cognitive status: Within functional limits for tasks assessed                              LOWER EXTREMITY ROM:   Active ROM Left eval Left 11/17/21 Left 12/06/21 Left 01/10/22 Left 01/23/22 Left 02/28/22 Left 04/04/22  Hip flexion          Hip extension          Hip abduction          Hip adduction          Hip internal rotation          Hip external rotation          Knee flexion          Knee extension          Ankle dorsiflexion '5 7 7 7 8 10 10  '$ Ankle plantarflexion WNL     WNL WNL  Ankle inversion WNL     WNL WNL  Ankle eversion '5 10 10 10 10 12 13   '$ (Blank rows = not  tested)   LOWER EXTREMITY MMT:   MMT in sitting/supine Left eval Left 12/06/21 Left 01/10/22 Left 01/23/22 Left 02/20/22 Left 04/04/22  Hip flexion 3+ 4      Hip extension         Hip abduction 3 4      Hip adduction         Hip internal rotation         Hip external rotation         Knee flexion         Knee extension         Ankle dorsiflexion 4 4+   4+ 5  Ankle plantarflexion 4 4+   4+ 4+  Ankle inversion 5 5      Ankle eversion 2+ 3- 3 3+ 3+ 4   (Blank rows = not tested)   LOWER EXTREMITY SPECIAL TESTS:      FUNCTIONAL TESTS:  10/24/21: 5 times sit to stand: 11.74 seconds without UE support  03/28/22: SLS time 2 sec on Left, 10 sec on Rt   GAIT: Distance walked: Buyer, retail device utilized: Single point cane Level of assistance: Modified independence Comments: foot drop noted on left with trendelenburg       TODAY'S TREATMENT: 04/13/22 -Recumbent bike L5 X 8 min -Slantboard 30 sec X 3 -bilateral heel and toe raises 2X20 -Tandem balance 30 sec X 2 bilat -SLS balance 10 sec X 5 bilat -Sit to stands with 15# KB 2 X 10 -Standing BAPS board L3, 2.5# sup/PRO, INV/EV, circles X 20 each -Side Stepping and diagonal stepping with red band around ankles 5 round trips at counter top -ankle DF,PF,EV red X 20 each  -E-stim russian/NMES for motor recruitment of peroneals/Tibialis anterior 5/5 sec on/off with intensity to tolerance and active EV/DF contraction/AROM for 5 sec hold during on phase in sidelying X 10 total minutes   04/11/22 -Recumbent bike seat #9, L5 X 8 min -Slantboard 30 sec X 3 -Heel raises eccentric for left 3 X10 -Tandem balance 30 sec X 3 -SLS balance max time he is able to perform, X 5 bilat -Sidestepping in bars and diagonal walks in bars 3 round trips each with red band -ankle rocker board A-P 1.5 min,  lateral 1.5 min -Sit to stands with 15# KB 2X10 -Stepping over 3 black wedges in bars alternating steps for balance and foot  clearance -ankle DF,PF,EV red X 20 each   -Manual therapy for skilled palpation and compression with Trigger Point Dry-Needling   Patient Consent Given: Yes Education handout provided: previously provided Muscles treated: left peroneal for muscle activation with Estim frequency 2 and intensity 3-4 to tolerance Treatment response/outcome: twitch response noted bilaterally      PATIENT EDUCATION:  Education details: exam findings and PT plan of care Person educated: Patient Education method: Explanation Education comprehension: verbalized understanding     HOME EXERCISE PROGRAM: Will review his HEP from last episode of care and revise PRN next session       ASSESSMENT:   CLINICAL IMPRESSION: We continued to work to maximize his left ankle EV strength and DF strength needed for gait along with balance training to reduce his risk of falling. PT will work to transition him to independent program over his remaining visits.    OBJECTIVE IMPAIRMENTS: decreased activity tolerance, difficulty walking, decreased balance, decreased endurance, decreased mobility, decreased ROM, decreased strength, impaired flexibility, impaired LE use, postural dysfunction, and pain.   ACTIVITY LIMITATIONS: bending, lifting, carry, locomotion, cleaning, community activity, and or occupation   PERSONAL FACTORS: Lt peroneal nerve decompression, foot drop,2020 Colon Cancer, Neuropathy in feet, PVD, prostate cancer, hernia repair are also affecting patient's functional outcome.   REHAB POTENTIAL: Fair     CLINICAL DECISION MAKING: Stable/uncomplicated   EVALUATION COMPLEXITY: Low       GOALS: Short term PT Goals Target date: 03/31/21 Pt will be I and compliant with HEP. Baseline:  Goal status: MET Pt will decrease pain by 25% overall with prolonged standing Baseline: Goal status:MET   Long term PT goals Target date: 05/23/21 Pt will improve left ankle ROM to WFL (10 deg EV and DF) in supine to  improve functional mobility Baseline: Goal status: MET 04/04/22 Pt will improve  hip/knee strength to at least 5-/5 MMT in sitting, and ankle EV strength to 4/5, ankle DF strength to 4+ to improve functional strength Baseline: Goal status: MET  04/04/22 Pt will improve FOTO to at least 69% functional to show improved function Baseline: Goal status: Discontinued, incorrect body part/set up Pt will reduce pain by overall 50% overall with usual activity and prolonged standing Baseline: Goal status: MET 04/04/22    PLAN: PT FREQUENCY: 1-2 times per week    PT DURATION: 8-12 weeks   PLANNED INTERVENTIONS (unless contraindicated): aquatic PT, Canalith repositioning, cryotherapy, Electrical stimulation, Iontophoresis with 4 mg/ml dexamethasome, Moist heat, traction, Ultrasound, gait training, Therapeutic exercise, balance training, neuromuscular re-education, patient/family education, prosthetic training, manual techniques, passive ROM, dry needling, taping, vasopnuematic device, vestibular, spinal manipulations, joint manipulations   PLAN FOR NEXT SESSION: DF and EV strength, balance, general leg strength, DN with stim and or [place] for peroneal recuitment    Debbe Odea, PT,DPT 04/13/2022, 8:53 AM

## 2022-04-18 ENCOUNTER — Encounter: Payer: Self-pay | Admitting: Physical Therapy

## 2022-04-18 ENCOUNTER — Ambulatory Visit (INDEPENDENT_AMBULATORY_CARE_PROVIDER_SITE_OTHER): Payer: Medicare Other | Admitting: Physical Therapy

## 2022-04-18 DIAGNOSIS — M6281 Muscle weakness (generalized): Secondary | ICD-10-CM | POA: Diagnosis not present

## 2022-04-18 DIAGNOSIS — M25552 Pain in left hip: Secondary | ICD-10-CM

## 2022-04-18 DIAGNOSIS — R262 Difficulty in walking, not elsewhere classified: Secondary | ICD-10-CM

## 2022-04-18 DIAGNOSIS — M79662 Pain in left lower leg: Secondary | ICD-10-CM

## 2022-04-18 NOTE — Therapy (Signed)
OUTPATIENT PHYSICAL THERAPY TREATMENT    Patient Name: Oscar Sparks MRN: [id number] DOB:[birth year], 82 y.o., male Today's Date: 04/18/2022   END OF SESSION:   PT End of Session - 04/18/22 0802     Visit Number 43    Number of Visits 51    Date for PT Re-Evaluation 05/23/22    Authorization Type MCR    Progress Note Due on Visit 32    PT Start Time 0801    PT Stop Time 0845    PT Time Calculation (min) 44 min    Activity Tolerance Patient tolerated treatment well    Behavior During Therapy [facility] for tasks assessed/performed              Past Medical History:  Diagnosis Date   Anemia    Arthritis    knees   Colon cancer ([place]) 09/24/2018   ED (erectile dysfunction)    Inguinal hernia    Neuromuscular disorder (HCC)    neuropathy in feet   Peripheral vascular disease (HCC)    poor curculation in hands and feet hard to monitor with pulse oximetry   Prostate cancer ([place])    sees Dr. Gaynelle Arabian, received cesium seeds    Past Surgical History:  Procedure Laterality Date   BACK SURGERY     INGUINAL HERNIA REPAIR Right 05/20/2021   Procedure: OPEN RIGHT INGUINAL HERNIA REPAIR WITH MESH;  Surgeon: Jovita Kussmaul, MD;  Location: [place];  Service: General;  Laterality: Right;   SPINE SURGERY  2004   Dr. 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  PERTINENT HISTORY: Lt peroneal nerve decompression, foot drop,2020 Colon Cancer, Neuropathy in feet, PVD, prostate cancer, hernia repair   PAIN:  Are you having pain? Yes: NPRS scale: 0/10 Pain location: left hip Pain description: inconvience Aggravating factors: prolonged standing or walking Relieving factors: rest, NSAIDS   PRECAUTIONS: Fall   WEIGHT BEARING RESTRICTIONS No   FALLS:  Has patient fallen in last 6 months? No, but has had several near falls   OCCUPATION: retired   PLOF: Independent with basic ADLs   PATIENT GOALS improve walking and balance by reducing foot drop     OBJECTIVE:  DIAGNOSTIC FINDINGS: nothing recent   PATIENT SURVEYS:  FOTO 61% functional intake at eval, goal is 69%   COGNITION:           Overall cognitive status: Within functional limits for tasks assessed                              LOWER EXTREMITY ROM:   Active ROM Left eval Left 11/17/21 Left 12/06/21 Left 01/10/22 Left 01/23/22 Left 02/28/22 Left 04/04/22  Hip flexion          Hip extension          Hip abduction          Hip adduction          Hip internal rotation          Hip external rotation          Knee flexion          Knee extension          Ankle dorsiflexion '5 7 7 7 8 10 10  '$ Ankle plantarflexion WNL     WNL WNL  Ankle inversion WNL     WNL  WNL  Ankle eversion '5 10 10 10 10 12 13   '$ (Blank rows = not tested)   LOWER EXTREMITY MMT:   MMT in sitting/supine Left eval Left 12/06/21 Left 01/10/22 Left 01/23/22 Left 02/20/22 Left 04/04/22  Hip flexion 3+ 4      Hip extension         Hip abduction 3 4      Hip adduction         Hip internal rotation         Hip external rotation         Knee flexion         Knee extension         Ankle dorsiflexion 4 4+   4+ 5  Ankle plantarflexion 4 4+   4+ 4+  Ankle inversion 5 5      Ankle eversion 2+ 3- 3 3+ 3+ 4   (Blank rows = not tested)   LOWER EXTREMITY SPECIAL TESTS:      FUNCTIONAL TESTS:  10/24/21: 5 times sit to stand: 11.74 seconds without UE support  03/28/22: SLS time 2 sec on Left, 10 sec on Rt   GAIT: Distance walked: Buyer, retail device utilized: Single point cane Level of assistance: Modified independence Comments: foot drop noted on left with trendelenburg       TODAY'S TREATMENT: 04/18/22 -Nu step L6 X 8 min UE/LE -Slantboard 30 sec X 3 -bilateral heel and toe raises 2X20 -Tandem balance 30 sec X 2 bilat -SLS balance 10 sec X 5 bilat -Leg press 125# 2X15, then 75# SL 2X15 -Rocker board 1.5 min A-P, 1.5 min lateral -Side Stepping and diagonal stepping with red band around ankles 5 round trips in bars -ankle DF,PF,EV red X 20 each -ankle circles X 15 with 3#, ankle alphabet with 3#  X1  Manual therapy for skilled palpation and compression with Trigger Point Dry-Needling   Patient Consent Given: Yes Education handout provided: previously provided Muscles treated: left peroneal for muscle activation with Estim frequency 2 and intensity 3-4 to tolerance Treatment response/outcome: twitch response noted bilaterally  04/13/22 -Recumbent bike L5 X 8 min -Slantboard 30 sec X 3 -bilateral heel and toe raises 2X20 -Tandem balance 30 sec X 2 bilat -SLS balance 10  sec X 5 bilat -Sit to stands with 15# KB 2 X 10 -Standing BAPS board L3, 2.5#  sup/PRO, INV/EV, circles X 20 each -Side Stepping and diagonal stepping with red band around ankles 5 round trips at counter top -ankle DF,PF,EV red X 20 each  -E-stim russian/NMES for motor recruitment of peroneals/Tibialis anterior 5/5 sec on/off with intensity to tolerance and active EV/DF contraction/AROM for 5 sec hold during on phase in sidelying X 10 total minutes     PATIENT EDUCATION:  Education details: exam findings and PT plan of care Person educated: Patient Education method: Explanation Education comprehension: verbalized understanding     HOME EXERCISE PROGRAM: Will review his HEP from last episode of care and revise PRN next session       ASSESSMENT:   CLINICAL IMPRESSION: He continues to show some progress and PT recommending to continue with skilled PT until end of February for maximal efforts to improve his foot drop and foot Eversion to improve gait and decrease risk of falling.   OBJECTIVE IMPAIRMENTS: decreased activity tolerance, difficulty walking, decreased balance, decreased endurance, decreased mobility, decreased ROM, decreased strength, impaired flexibility, impaired LE use, postural dysfunction, and pain.   ACTIVITY LIMITATIONS: bending, lifting, carry, locomotion, cleaning, community activity, and or occupation   PERSONAL FACTORS: Lt peroneal nerve decompression, foot drop,2020 Colon Cancer, Neuropathy in feet, PVD, prostate cancer, hernia repair are also affecting patient's functional outcome.   REHAB POTENTIAL: Fair     CLINICAL DECISION MAKING: Stable/uncomplicated   EVALUATION COMPLEXITY: Low       GOALS: Short term PT Goals Target date: 03/31/21 Pt will be I and compliant with HEP. Baseline:  Goal status: MET Pt will decrease pain by 25% overall with prolonged standing Baseline: Goal status:MET   Long term PT goals Target date: 05/23/21 Pt will improve left ankle ROM to WFL (10 deg EV and DF) in supine to improve functional  mobility Baseline: Goal status: MET 04/04/22 Pt will improve  hip/knee strength to at least 5-/5 MMT in sitting, and ankle EV strength to 4/5, ankle DF strength to 4+ to improve functional strength Baseline: Goal status: MET  04/04/22 Pt will improve FOTO to at least 69% functional to show improved function Baseline: Goal status: Discontinued, incorrect body part/set up Pt will reduce pain by overall 50% overall with usual activity and prolonged standing Baseline: Goal status: MET 04/04/22    PLAN: PT FREQUENCY: 1-2 times per week    PT DURATION: 8-12 weeks   PLANNED INTERVENTIONS (unless contraindicated): aquatic PT, Canalith repositioning, cryotherapy, Electrical stimulation, Iontophoresis with 4 mg/ml dexamethasome, Moist heat, traction, Ultrasound, gait training, Therapeutic exercise, balance training, neuromuscular re-education, patient/family education, prosthetic training, manual techniques, passive ROM, dry needling, taping, vasopnuematic device, vestibular, spinal manipulations, joint manipulations   PLAN FOR NEXT SESSION: PT until end of feburary, DF and EV strength, balance, general leg strength, DN with stim and or [place] for peroneal recuitment    Debbe Odea, PT,DPT 04/18/2022, 8:02 AM

## 2022-04-20 ENCOUNTER — Encounter: Payer: Medicare Other | Admitting: Physical Therapy

## 2022-04-25 ENCOUNTER — Encounter: Payer: Self-pay | Admitting: Physical Therapy

## 2022-04-25 ENCOUNTER — Ambulatory Visit (INDEPENDENT_AMBULATORY_CARE_PROVIDER_SITE_OTHER): Payer: Medicare Other | Admitting: Physical Therapy

## 2022-04-25 DIAGNOSIS — M6281 Muscle weakness (generalized): Secondary | ICD-10-CM

## 2022-04-25 DIAGNOSIS — R262 Difficulty in walking, not elsewhere classified: Secondary | ICD-10-CM | POA: Diagnosis not present

## 2022-04-25 DIAGNOSIS — M79662 Pain in left lower leg: Secondary | ICD-10-CM

## 2022-04-25 DIAGNOSIS — M25552 Pain in left hip: Secondary | ICD-10-CM | POA: Diagnosis not present

## 2022-04-25 NOTE — Therapy (Signed)
OUTPATIENT PHYSICAL THERAPY TREATMENT    Patient Name: DEANGLEO PASSAGE MRN: [id number] DOB:24-Aug-1940, 82 y.o., male Today's Date: 04/25/2022   END OF SESSION:   PT End of Session - 04/25/22 0806     Visit Number 44    Number of Visits 45    Date for PT Re-Evaluation 05/23/22    Authorization Type MCR    Progress Note Due on Visit 44    PT Start Time 0801    PT Stop Time 0845    PT Time Calculation (min) 44 min    Activity Tolerance Patient tolerated treatment well    Behavior During Therapy [facility] for tasks assessed/performed              Past Medical History:  Diagnosis Date   Anemia    Arthritis    knees   Colon cancer ([place]) 09/24/2018   ED (erectile dysfunction)    Inguinal hernia    Neuromuscular disorder (HCC)    neuropathy in feet   Peripheral vascular disease (HCC)    poor curculation in hands and feet hard to monitor with pulse oximetry   Prostate cancer ([place])    sees Dr. Gaynelle Arabian, received cesium seeds    Past Surgical History:  Procedure Laterality Date   BACK SURGERY     INGUINAL HERNIA REPAIR Right 05/20/2021   Procedure: OPEN RIGHT INGUINAL HERNIA REPAIR WITH MESH;  Surgeon: Jovita Kussmaul, MD;  Location: [place];  Service: General;  Laterality: Right;   SPINE SURGERY  2004   Dr. 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 Relays his left foot feels pretty good today.  PERTINENT HISTORY: Lt peroneal nerve decompression, foot drop,2020 Colon Cancer, Neuropathy in feet, PVD, prostate cancer, hernia repair   PAIN:  Are you having pain? Yes: NPRS scale: 0/10 Pain location: left hip Pain description: inconvience Aggravating factors: prolonged standing or walking Relieving factors: rest, NSAIDS   PRECAUTIONS: Fall   WEIGHT BEARING RESTRICTIONS No   FALLS:  Has patient fallen in last 6 months? No, but has had several near falls   OCCUPATION: retired   PLOF: Independent with basic ADLs   PATIENT GOALS improve walking and balance by reducing foot drop  OBJECTIVE:    DIAGNOSTIC FINDINGS: nothing recent   PATIENT SURVEYS:  FOTO 61% functional intake at eval, goal is 69%   COGNITION:           Overall cognitive status: Within functional limits for tasks assessed                              LOWER EXTREMITY ROM:   Active ROM Left eval Left 11/17/21 Left 12/06/21 Left 01/10/22 Left 01/23/22 Left 02/28/22 Left 04/04/22  Hip flexion          Hip extension          Hip abduction          Hip adduction          Hip internal rotation          Hip external rotation          Knee flexion          Knee extension          Ankle dorsiflexion '5 7 7 7 8 10 10  '$ Ankle plantarflexion WNL     WNL  WNL  Ankle inversion WNL     WNL WNL  Ankle eversion '5 10 10 10 10 12 13   '$ (Blank rows = not tested)   LOWER EXTREMITY MMT:   MMT in sitting/supine Left eval Left 12/06/21 Left 01/10/22 Left 01/23/22 Left 02/20/22 Left 04/04/22  Hip flexion 3+ 4      Hip extension         Hip abduction 3 4      Hip adduction         Hip internal rotation         Hip external rotation         Knee flexion         Knee extension         Ankle dorsiflexion 4 4+   4+ 5  Ankle plantarflexion 4 4+   4+ 4+  Ankle inversion 5 5      Ankle eversion 2+ 3- 3 3+ 3+ 4   (Blank rows = not tested)   LOWER EXTREMITY SPECIAL TESTS:      FUNCTIONAL TESTS:  10/24/21: 5 times sit to stand: 11.74 seconds without UE support  03/28/22: SLS time 2 sec on Left, 10 sec on Rt   GAIT: Distance walked: Buyer, retail device utilized: Single point cane Level of assistance: Modified independence Comments: foot drop noted on left with trendelenburg       TODAY'S TREATMENT: 04/25/22 -Nu step seat#12, L6 X 8 min -Slantboard 30 sec X 3 -bilateral heel and toe raises 2X20 -Tandem balance 30 sec X 2 bilat -SLS balance 10 sec X 5 bilat -Sit to stands with 15# KB 2 X 10 -Standing BAPS board L3, 2.5# sup/PRO, INV/EV, circles X 20 each -Side Stepping and diagonal stepping with red band around ankles 4 round trips at counter top -Leg press DL 125# 2X15, SL 75# 2X10 -ankle DF,PF,EV red X 20 each  -E-stim russian/NMES for motor recruitment of peroneals/Tibialis anterior 5/5 sec on/off with intensity to tolerance and active EV/DF contraction/AROM for 5 sec hold during on phase in sidelying X 10 total minutes   04/18/22 -Nu step L6 X 8 min UE/LE -Slantboard 30 sec X 3 -bilateral heel and toe raises 2X20 -Tandem balance 30 sec X 2 bilat -SLS balance 10 sec X 5 bilat -Leg  press 125# 2X15, then 75# SL 2X15 -Rocker board 1.5 min A-P, 1.5 min lateral -Side Stepping and diagonal stepping with red band around  ankles 5 round trips in bars -ankle DF,PF,EV red X 20 each -ankle circles X 15 with 3#, ankle alphabet with 3#  X1  Manual therapy for skilled palpation and compression with Trigger Point Dry-Needling   Patient Consent Given: Yes Education handout provided: previously provided Muscles treated: left peroneal for muscle activation with Estim frequency 2 and intensity 3-4 to tolerance Treatment response/outcome: twitch response noted bilaterally      PATIENT EDUCATION:  Education details: exam findings and PT plan of care Person educated: Patient Education method: Explanation Education comprehension: verbalized understanding     HOME EXERCISE PROGRAM: Will review his HEP from last episode of care and revise PRN next session       ASSESSMENT:   CLINICAL IMPRESSION: He had good tolerance to session without complaints noted. We are continuing max efforts to improve the weakness in his left peroneals to improve function.    OBJECTIVE IMPAIRMENTS: decreased activity tolerance, difficulty walking, decreased balance, decreased endurance, decreased mobility, decreased ROM, decreased strength, impaired flexibility, impaired LE use, postural dysfunction, and pain.   ACTIVITY LIMITATIONS: bending, lifting, carry, locomotion, cleaning, community activity, and or occupation   PERSONAL FACTORS: Lt peroneal nerve decompression, foot drop,2020 Colon Cancer, Neuropathy in feet, PVD, prostate cancer, hernia repair are also affecting patient's functional outcome.   REHAB POTENTIAL: Fair     CLINICAL DECISION MAKING: Stable/uncomplicated   EVALUATION COMPLEXITY: Low       GOALS: Short term PT Goals Target date: 03/31/21 Pt will be I and compliant with HEP. Baseline:  Goal status: MET Pt will decrease pain by 25% overall with prolonged standing Baseline: Goal status:MET   Long term PT goals Target date: 05/23/21 Pt will improve left ankle ROM to WFL (10 deg EV and DF) in supine to improve  functional mobility Baseline: Goal status: MET 04/04/22 Pt will improve  hip/knee strength to at least 5-/5 MMT in sitting, and ankle EV strength to 4/5, ankle DF strength to 4+ to improve functional strength Baseline: Goal status: MET  04/04/22 Pt will improve FOTO to at least 69% functional to show improved function Baseline: Goal status: Discontinued, incorrect body part/set up Pt will reduce pain by overall 50% overall with usual activity and prolonged standing Baseline: Goal status: MET 04/04/22    PLAN: PT FREQUENCY: 1-2 times per week    PT DURATION: 8-12 weeks   PLANNED INTERVENTIONS (unless contraindicated): aquatic PT, Canalith repositioning, cryotherapy, Electrical stimulation, Iontophoresis with 4 mg/ml dexamethasome, Moist heat, traction, Ultrasound, gait training, Therapeutic exercise, balance training, neuromuscular re-education, patient/family education, prosthetic training, manual techniques, passive ROM, dry needling, taping, vasopnuematic device, vestibular, spinal manipulations, joint manipulations   PLAN FOR NEXT SESSION: PT until end of feburary, DF and EV strength, balance, general leg strength, DN with stim and or [place] for peroneal recuitment    Debbe Odea, PT,DPT 04/25/2022, 8:07 AM

## 2022-04-27 ENCOUNTER — Encounter: Payer: Self-pay | Admitting: Physical Therapy

## 2022-04-27 ENCOUNTER — Ambulatory Visit (INDEPENDENT_AMBULATORY_CARE_PROVIDER_SITE_OTHER): Payer: Medicare Other | Admitting: Physical Therapy

## 2022-04-27 DIAGNOSIS — R262 Difficulty in walking, not elsewhere classified: Secondary | ICD-10-CM

## 2022-04-27 DIAGNOSIS — M25552 Pain in left hip: Secondary | ICD-10-CM | POA: Diagnosis not present

## 2022-04-27 DIAGNOSIS — M79662 Pain in left lower leg: Secondary | ICD-10-CM | POA: Diagnosis not present

## 2022-04-27 DIAGNOSIS — M6281 Muscle weakness (generalized): Secondary | ICD-10-CM | POA: Diagnosis not present

## 2022-04-27 NOTE — Therapy (Signed)
OUTPATIENT PHYSICAL THERAPY TREATMENT    Patient Name: Oscar Sparks MRN: [id number] DOB:March 28, 1940, 82 y.o., male Today's Date: 04/27/2022   END OF SESSION:   PT End of Session - 04/27/22 0846     Visit Number 45    Number of Visits 45    Date for PT Re-Evaluation 05/23/22    Authorization Type MCR    Progress Note Due on Visit 46    PT Start Time 0843    PT Stop Time 0929    PT Time Calculation (min) 46 min    Activity Tolerance Patient tolerated treatment well    Behavior During Therapy [facility] for tasks assessed/performed               Past Medical History:  Diagnosis Date   Anemia    Arthritis    knees   Colon cancer ([place]) 09/24/2018   ED (erectile dysfunction)    Inguinal hernia    Neuromuscular disorder (HCC)    neuropathy in feet   Peripheral vascular disease (HCC)    poor curculation in hands and feet hard to monitor with pulse oximetry   Prostate cancer ([place])    sees Dr. Gaynelle Arabian, received cesium seeds    Past Surgical History:  Procedure Laterality Date   BACK SURGERY     INGUINAL HERNIA REPAIR Right 05/20/2021   Procedure: OPEN RIGHT INGUINAL HERNIA REPAIR WITH MESH;  Surgeon: Jovita Kussmaul, MD;  Location: [place];  Service: General;  Laterality: Right;   SPINE SURGERY  2004   Dr. 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   PERTINENT HISTORY: Lt peroneal nerve decompression, foot drop,2020 Colon Cancer, Neuropathy in feet, PVD, prostate cancer, hernia repair   PAIN:  Are you having pain? Yes: NPRS scale: 0/10 Pain location: left hip Pain description: inconvience Aggravating factors: prolonged standing or walking Relieving factors: rest, NSAIDS   PRECAUTIONS: Fall   WEIGHT BEARING RESTRICTIONS No   FALLS:  Has patient fallen in last 6 months? No, but has had several near falls   OCCUPATION: retired   PLOF: Independent with basic ADLs   PATIENT GOALS improve walking and balance by reducing foot drop     OBJECTIVE:  DIAGNOSTIC FINDINGS: nothing recent   PATIENT SURVEYS:  FOTO 61% functional intake at eval, goal is 69%   COGNITION:           Overall cognitive status: Within functional limits for tasks assessed                              LOWER EXTREMITY ROM:   Active ROM Left eval Left 11/17/21 Left 12/06/21 Left 01/10/22 Left 01/23/22 Left 02/28/22 Left 04/04/22  Hip flexion          Hip extension          Hip abduction          Hip adduction          Hip internal rotation          Hip external rotation          Knee flexion          Knee extension          Ankle dorsiflexion '5 7 7 7 8 10 10  '$ Ankle plantarflexion WNL     WNL WNL  Ankle inversion WNL     WNL WNL   Ankle eversion '5 10 10 10 10 12 13   '$ (Blank rows = not tested)   LOWER EXTREMITY MMT:   MMT in sitting/supine Left eval Left 12/06/21 Left 01/10/22 Left 01/23/22 Left 02/20/22 Left 04/04/22  Hip flexion 3+ 4      Hip extension         Hip abduction 3 4      Hip adduction         Hip internal rotation         Hip external rotation         Knee flexion         Knee extension         Ankle dorsiflexion 4 4+   4+ 5  Ankle plantarflexion 4 4+   4+ 4+  Ankle inversion 5 5      Ankle eversion 2+ 3- 3 3+ 3+ 4   (Blank rows = not tested)   LOWER EXTREMITY SPECIAL TESTS:      FUNCTIONAL TESTS:  10/24/21: 5 times sit to stand: 11.74 seconds without UE support  03/28/22: SLS time 2 sec on Left, 10 sec on Rt   GAIT: Distance walked: Buyer, retail device utilized: Single point cane Level of assistance: Modified independence Comments: foot drop noted on left with trendelenburg       TODAY'S TREATMENT: 04/27/22 -Nu step L8 X 8 min UE/LE -Slantboard 30 sec X 3 -bilateral heel and toe raises 2X20 -Tandem balance 30 sec X 2 bilat -SLS balance 10 sec X 5 bilat -Leg press 125# 2X15, then 75# SL 2X15 -Rocker board 1.5 min A-P, 1.5 min lateral -Sidestepping lateral in bars over obstacles x5 round trips  -ankle DF,PF,EV red X 20 each -ankle circles X 15 with 3#, ankle alphabet with 3#  X1  Manual therapy (performed by Elsie Ra PT,DPT)for skilled palpation and compression with Trigger Point Dry-Needling   Patient Consent Given: Yes Education handout provided: previously provided Muscles treated: left peroneal for muscle activation with Estim frequency 2 and intensity 3-4 to tolerance Treatment response/outcome: twitch response noted bilaterally  04/25/22 -Nu step seat#12, L6 X 8 min -Slantboard 30 sec X 3 -bilateral heel and toe raises 2X20 -Tandem balance 30 sec X 2 bilat -SLS balance 10  sec X 5 bilat -Sit to stands with 15# KB 2 X 10 -Standing BAPS board L3,  2.5# sup/PRO, INV/EV, circles X 20 each -Side Stepping and diagonal stepping with red band around ankles 4 round trips at counter top -Leg press DL 125# 2X15, SL 75# 2X10 -ankle DF,PF,EV red X 20 each  -E-stim russian/NMES for motor recruitment of peroneals/Tibialis anterior 5/5 sec on/off with intensity to tolerance and active EV/DF contraction/AROM for 5 sec hold during on phase in sidelying X 10 total minutes       PATIENT EDUCATION:  Education details: exam findings and PT plan of care Person educated: Patient Education method: Explanation Education comprehension: verbalized understanding     HOME EXERCISE PROGRAM: Will review his HEP from last episode of care and revise PRN next session       ASSESSMENT:   CLINICAL IMPRESSION: Bill showed good tolerance to his treatment session today. He continues to show progress in strength and balance, but still shows limitations especially with eversion and plantar flexion. He will continue to benefit from skilled PT to address these deficits and improve functionally.  OBJECTIVE IMPAIRMENTS: decreased activity tolerance, difficulty walking, decreased balance, decreased endurance, decreased mobility, decreased ROM, decreased strength, impaired flexibility, impaired LE use, postural dysfunction, and pain.   ACTIVITY LIMITATIONS: bending, lifting, carry, locomotion, cleaning, community activity, and or occupation   PERSONAL FACTORS: Lt peroneal nerve decompression, foot drop,2020 Colon Cancer, Neuropathy in feet, PVD, prostate cancer, hernia repair are also affecting patient's functional outcome.   REHAB POTENTIAL: Fair     CLINICAL DECISION MAKING: Stable/uncomplicated   EVALUATION COMPLEXITY: Low       GOALS: Short term PT Goals Target date: 03/31/21 Pt will be I and compliant with HEP. Baseline:  Goal status: MET Pt will decrease pain by 25% overall with prolonged standing Baseline: Goal status:MET   Long term PT goals Target  date: 05/23/21 Pt will improve left ankle ROM to WFL (10 deg EV and DF) in supine to improve functional mobility Baseline: Goal status: MET 04/04/22 Pt will improve  hip/knee strength to at least 5-/5 MMT in sitting, and ankle EV strength to 4/5, ankle DF strength to 4+ to improve functional strength Baseline: Goal status: MET  04/04/22 Pt will improve FOTO to at least 69% functional to show improved function Baseline: Goal status: Discontinued, incorrect body part/set up Pt will reduce pain by overall 50% overall with usual activity and prolonged standing Baseline: Goal status: MET 04/04/22    PLAN: PT FREQUENCY: 1-2 times per week    PT DURATION: 8-12 weeks   PLANNED INTERVENTIONS (unless contraindicated): aquatic PT, Canalith repositioning, cryotherapy, Electrical stimulation, Iontophoresis with 4 mg/ml dexamethasome, Moist heat, traction, Ultrasound, gait training, Therapeutic exercise, balance training, neuromuscular re-education, patient/family education, prosthetic training, manual techniques, passive ROM, dry needling, taping, vasopnuematic device, vestibular, spinal manipulations, joint manipulations   PLAN FOR NEXT SESSION: Recert next visit. PT until end of feburary, EV strength, balance, general leg strength, DN with stim and or [place] for peroneal recuitment    Christopherjame Carnell, Student-PT 04/27/2022, 9:25 AM

## 2022-05-01 ENCOUNTER — Encounter: Payer: Self-pay | Admitting: Physical Therapy

## 2022-05-01 ENCOUNTER — Ambulatory Visit (INDEPENDENT_AMBULATORY_CARE_PROVIDER_SITE_OTHER): Payer: Medicare Other | Admitting: Physical Therapy

## 2022-05-01 DIAGNOSIS — R262 Difficulty in walking, not elsewhere classified: Secondary | ICD-10-CM | POA: Diagnosis not present

## 2022-05-01 DIAGNOSIS — M6281 Muscle weakness (generalized): Secondary | ICD-10-CM | POA: Diagnosis not present

## 2022-05-01 DIAGNOSIS — M79662 Pain in left lower leg: Secondary | ICD-10-CM

## 2022-05-01 DIAGNOSIS — M25552 Pain in left hip: Secondary | ICD-10-CM | POA: Diagnosis not present

## 2022-05-01 NOTE — Addendum Note (Signed)
Addended by: Debbe Odea on: 05/01/2022 08:58 AM   Modules accepted: Orders

## 2022-05-01 NOTE — Therapy (Addendum)
OUTPATIENT PHYSICAL THERAPY TREATMENT/Recert/Progress note Progress Note reporting period 04/04/22 to 05/01/22  See below for objective and subjective measurements relating to patients progress with PT.    Patient Name: Oscar Sparks MRN: [id number] DOB:1941-03-04, 82 y.o., male Today's Date: 05/01/2022   END OF SESSION:   PT End of Session - 05/01/22 0809     Visit Number 39    Number of Visits 21    Date for PT Re-Evaluation 05/25/22    Authorization Type MCR    Progress Note Due on Visit 35   last did at 40   PT Start Time 0800    PT Stop Time 0845    PT Time Calculation (min) 45 min    Activity Tolerance Patient tolerated treatment well    Behavior During Therapy [facility] for tasks assessed/performed               Past Medical History:  Diagnosis Date   Anemia    Arthritis    knees   Colon cancer ([place]) 09/24/2018   ED (erectile dysfunction)    Inguinal hernia    Neuromuscular disorder (HCC)    neuropathy in feet   Peripheral vascular disease (HCC)    poor curculation in hands and feet hard to monitor with pulse oximetry   Prostate cancer ([place])    sees Dr. Gaynelle Arabian, received cesium seeds    Past Surgical History:  Procedure Laterality Date   BACK SURGERY     INGUINAL HERNIA REPAIR Right 05/20/2021   Procedure: OPEN RIGHT INGUINAL HERNIA REPAIR WITH MESH;  Surgeon: Jovita Kussmaul, MD;  Location: [place];  Service: General;  Laterality: Right;   SPINE SURGERY  2004   Dr. 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   Cancer of ascending colon s/p robotic colectomy 11/20/2018 09/30/2018   Iron  deficiency anemia 10/16/2016   Pain in left hip 06/25/2014   Piriformis syndrome of left side 06/25/2014   Prostate cancer ([place]) 11/15/2009   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  PERTINENT HISTORY: Lt peroneal nerve decompression, foot drop,2020 Colon Cancer, Neuropathy in feet, PVD, prostate cancer, hernia repair   PAIN:  Are you having pain? Yes: NPRS scale: 0/10 Pain location: left hip Pain description: inconvience  Aggravating factors: prolonged standing or walking Relieving factors: rest, NSAIDS   PRECAUTIONS: Fall   WEIGHT BEARING RESTRICTIONS No   FALLS:  Has patient fallen in last 6 months? No, but has had several near falls   OCCUPATION: retired   PLOF: Independent with basic ADLs   PATIENT GOALS improve walking and balance by reducing foot drop     OBJECTIVE:    DIAGNOSTIC FINDINGS: nothing recent   PATIENT SURVEYS:  FOTO 61% functional intake at eval, goal is 69%   COGNITION:           Overall cognitive status: Within functional limits for tasks assessed                              LOWER EXTREMITY ROM:   Active ROM Left eval  Left 11/17/21 Left 12/06/21 Left 01/10/22 Left 01/23/22 Left 02/28/22 Left 04/04/22 Left  05/01/22  Hip flexion           Hip extension           Hip abduction           Hip adduction           Hip internal rotation           Hip external rotation           Knee flexion           Knee extension           Ankle dorsiflexion '5 7 7 7 8 10 10 10  '$ Ankle plantarflexion WNL     WNL WNL WNL  Ankle inversion WNL     WNL WNL WNL  Ankle eversion '5 10 10 10 10 12 13 15   '$ (Blank rows = not tested)   LOWER EXTREMITY MMT:   MMT in sitting/supine Left eval Left 12/06/21 Left 01/10/22 Left 01/23/22 Left 02/20/22 Left 04/04/22 Left 05/01/22 in supine  Hip flexion 3+ 4       Hip extension          Hip abduction 3 4       Hip adduction          Hip internal rotation          Hip external rotation          Knee flexion          Knee extension          Ankle dorsiflexion 4 4+   4+ 5 5  Ankle plantarflexion 4 4+   4+ 4+ 5/ 3- in standing  Ankle inversion 5 5       Ankle eversion 2+ 3- 3 3+ 3+ 4 4   (Blank rows = not tested)   LOWER EXTREMITY SPECIAL TESTS:      FUNCTIONAL TESTS:  10/24/21: 5 times sit to stand: 11.74 seconds without UE support  03/28/22: SLS time 2 sec on Left, 10 sec on Rt 05/01/22: SLS time 5 seconds on left   GAIT: Distance walked: Buyer, retail device utilized: Single point cane Level of assistance: Modified independence Comments: foot drop noted on left with trendelenburg       TODAY'S TREATMENT: 05/01/22 -Recumbent bike L5 X 8 min -bilateral heel and toe raises 2X20 -Tandem walk 5 round trips in bars  -SLS balance 10 sec X 5 bilat -Sit to stands with calf raise 15# KB 2 X 10 -Side Stepping and diagonal stepping with red  band around ankles 5 round trips in bars -Rocker board 1.5 min A-P, 1.5 min lateral -ankle DF,PF,EV red X 20 each  -E-stim russian/NMES for motor recruitment of peroneals/Tibialis anterior 5/5 sec on/off with intensity to  tolerance and active EV/DF contraction/AROM for 5 sec hold during on phase in sidelying X 10 total minutes   -updated measurements and goals  04/27/22 -Nu step L8 X 8 min UE/LE -Slantboard 30 sec X 3 -bilateral heel and toe raises 2X20 -Tandem balance 30 sec X 2 bilat -SLS balance 10 sec X 5 bilat -Leg press 125# 2X15, then 75# SL 2X15 -Rocker board 1.5 min A-P, 1.5 min lateral -Sidestepping lateral in bars over obstacles x5 round trips  -ankle DF,PF,EV red X 20 each -ankle circles X 15 with 3#, ankle alphabet with 3#  X1  Manual therapy (performed by Elsie Ra PT,DPT)for skilled palpation and compression with Trigger Point Dry-Needling   Patient Consent Given: Yes Education handout provided: previously provided Muscles treated: left peroneal for muscle activation with Estim frequency 2 and intensity 3-4 to tolerance Treatment response/outcome: twitch response noted bilaterally      PATIENT EDUCATION:  Education details: exam findings and PT plan of care Person educated: Patient Education method: Explanation Education comprehension: verbalized understanding     HOME EXERCISE PROGRAM: Will review his HEP from last episode of care and revise PRN next session       ASSESSMENT:   CLINICAL IMPRESSION: Recert due to his PT plan of care visits expired after today. His date was to go until the end of February and I feel it is medically necessary for him to continue to benefit from skilled PT for up to 8 more visits or until the end of February. We will plan to discharge him to HEP after this. He has had extensive rehab post op peroneal nerve decompression surgery and he does still have limitations in foot EV strength and overall balance but he has made progress in PT, see above for updated measurements and below for the status of his PT goals. He has 2 more long term PT goals we are still working on meeting.   OBJECTIVE IMPAIRMENTS: decreased activity tolerance, difficulty walking,  decreased balance, decreased endurance, decreased mobility, decreased ROM, decreased strength, impaired flexibility, impaired LE use, postural dysfunction, and pain.   ACTIVITY LIMITATIONS: bending, lifting, carry, locomotion, cleaning, community activity, and or occupation   PERSONAL FACTORS: Lt peroneal nerve decompression, foot drop,2020 Colon Cancer, Neuropathy in feet, PVD, prostate cancer, hernia repair are also affecting patient's functional outcome.   REHAB POTENTIAL: Fair     CLINICAL DECISION MAKING: Stable/uncomplicated   EVALUATION COMPLEXITY: Low       GOALS: Short term PT Goals Target date: 03/31/21 Pt will be I and compliant with HEP. Baseline:  Goal status: MET Pt will decrease pain by 25% overall with prolonged standing Baseline: Goal status:MET   Long term PT goals Target date: 2/29/23 Pt will improve left ankle ROM to WFL (10 deg EV and DF) in supine to improve functional mobility Baseline: Goal status: MET 04/04/22 Pt will improve  hip/knee strength to at least 5-/5 MMT in sitting, and ankle EV strength to 4/5, ankle DF strength to 4+ to improve functional strength Baseline: Goal status: MET  04/04/22 Pt will improve FOTO to at least 69% functional to show improved function Baseline: Goal status: Discontinued, incorrect body part/set up Pt will reduce pain by overall 50% overall with usual activity and prolonged standing Baseline: Goal status: MET  04/04/22 5. Pt will improve SLS balance time on left foot to 8 seconds or more   Baseline: 2 sec but has improved to 5 sec on 05/01/22   Goal status: NEW    PLAN: PT FREQUENCY: 1-2 times per week    PT DURATION: 8-12 weeks   PLANNED INTERVENTIONS (unless contraindicated): aquatic PT, Canalith repositioning, cryotherapy, Electrical stimulation, Iontophoresis with 4 mg/ml dexamethasome, Moist heat, traction, Ultrasound, gait training, Therapeutic exercise, balance training, neuromuscular re-education, patient/family  education, prosthetic training, manual techniques, passive ROM, dry needling, taping, vasopnuematic device, vestibular, spinal manipulations, joint manipulations   PLAN FOR NEXT SESSION:  PT until end of feburary then discharge to HEP, EV strength, balance, general leg strength, DN with stim and or [place] for peroneal recuitment    Debbe Odea, PT,DPT 05/01/2022, 8:57 AM

## 2022-05-04 ENCOUNTER — Encounter: Payer: Self-pay | Admitting: Physical Therapy

## 2022-05-04 ENCOUNTER — Ambulatory Visit (INDEPENDENT_AMBULATORY_CARE_PROVIDER_SITE_OTHER): Payer: Medicare Other | Admitting: Physical Therapy

## 2022-05-04 DIAGNOSIS — M79662 Pain in left lower leg: Secondary | ICD-10-CM

## 2022-05-04 DIAGNOSIS — M25552 Pain in left hip: Secondary | ICD-10-CM | POA: Diagnosis not present

## 2022-05-04 DIAGNOSIS — R262 Difficulty in walking, not elsewhere classified: Secondary | ICD-10-CM

## 2022-05-04 DIAGNOSIS — M6281 Muscle weakness (generalized): Secondary | ICD-10-CM

## 2022-05-04 NOTE — Therapy (Signed)
OUTPATIENT PHYSICAL THERAPY TREATMENT    Patient Name: Oscar Sparks MRN: [id number] DOB:1940/08/07, 82 y.o., male Today's Date: 05/04/2022   END OF SESSION:   PT End of Session - 05/04/22 0804     Visit Number 59    Number of Visits 7    Date for PT Re-Evaluation 05/25/22    Authorization Type MCR    Progress Note Due on Visit 56   last did at 52   PT Start Time 0802    PT Stop Time 0846    PT Time Calculation (min) 44 min    Activity Tolerance Patient tolerated treatment well    Behavior During Therapy [facility] for tasks assessed/performed                Past Medical History:  Diagnosis Date   Anemia    Arthritis    knees   Colon cancer ([place]) 09/24/2018   ED (erectile dysfunction)    Inguinal hernia    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 Gaynelle Arabian, received cesium seeds    Past Surgical History:  Procedure Laterality Date   BACK SURGERY     INGUINAL HERNIA REPAIR Right 05/20/2021   Procedure: OPEN RIGHT INGUINAL HERNIA REPAIR WITH MESH;  Surgeon: Jovita Kussmaul, MD;  Location: [place];  Service: General;  Laterality: Right;   SPINE SURGERY  2004   Dr. 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 He reports no pain.   PERTINENT HISTORY: Lt peroneal nerve decompression, foot drop,2020 Colon Cancer, Neuropathy in feet, PVD, prostate cancer, hernia repair   PAIN:  Are you having pain? Yes: NPRS scale: 0/10 Pain location: left hip Pain description: inconvience Aggravating factors: prolonged standing or walking Relieving factors: rest, NSAIDS   PRECAUTIONS: Fall   WEIGHT BEARING RESTRICTIONS No   FALLS:  Has patient fallen in last 6 months? No, but has had several near falls   OCCUPATION: retired   PLOF: Independent with basic ADLs   PATIENT GOALS  improve walking and balance by reducing foot drop     OBJECTIVE:    DIAGNOSTIC FINDINGS: nothing recent   PATIENT SURVEYS:  FOTO 61% functional intake at eval, goal is 69%   COGNITION:           Overall cognitive status: Within functional limits for tasks assessed                              LOWER EXTREMITY ROM:   Active ROM Left eval Left 11/17/21 Left 12/06/21 Left 01/10/22 Left 01/23/22 Left 02/28/22 Left 04/04/22 Left  05/01/22  Hip flexion           Hip extension           Hip abduction           Hip adduction           Hip internal rotation           Hip external rotation           Knee flexion           Knee extension           Ankle dorsiflexion  '5 7 7 7 8 10 10 10  '$ Ankle plantarflexion WNL     WNL WNL WNL  Ankle inversion WNL     WNL WNL WNL  Ankle eversion '5 10 10 10 10 12 13 15   '$ (Blank rows = not tested)   LOWER EXTREMITY MMT:   MMT in sitting/supine Left eval Left 12/06/21 Left 01/10/22 Left 01/23/22 Left 02/20/22 Left 04/04/22 Left 05/01/22 in supine  Hip flexion 3+ 4       Hip extension          Hip abduction 3 4       Hip adduction          Hip internal rotation          Hip external rotation          Knee flexion          Knee extension          Ankle dorsiflexion 4 4+   4+ 5 5  Ankle plantarflexion 4 4+   4+ 4+ 5/ 3- in standing  Ankle inversion 5 5       Ankle eversion 2+ 3- 3 3+ 3+ 4 4   (Blank rows = not tested)   LOWER EXTREMITY SPECIAL TESTS:      FUNCTIONAL TESTS:  10/24/21: 5 times sit to stand: 11.74 seconds without UE support  03/28/22: SLS time 2 sec on Left, 10 sec on Rt 05/01/22: SLS time 5 seconds on left   GAIT: Distance walked: Buyer, retail device utilized: Single point cane Level of assistance: Modified independence Comments: foot drop noted on left with trendelenburg       TODAY'S TREATMENT: 05/04/22 -Recumbent bike L5 X 10 min -Bilateral heel and toe raises 2X20 -Tandem balance 15 sec x2 bilat  -SLS balance 10 sec X 5 bilat -Sit to stands with calf raise 15# KB 2 X 10 -Side Stepping and diagonal stepping with red band around ankles 5 round trips in bars -Tandem walking on airex runway in bars touchdown support x5 round trips -Rocker board 1.5 min A-P, 1.5 min lateral touchdown support  -ankle DF,PF,EV red X 20 each -ankle circles X 15 with 3#, ankle alphabet with 3#  X1   Manual therapy (performed  by Elsie Ra PT,DPT)for skilled palpation and compression with Trigger Point Dry-Needling   Patient Consent Given: Yes Education handout provided: previously provided Muscles treated: left peroneal for muscle activation with Estim frequency 2 and intensity 3-4 to  tolerance Treatment response/outcome: twitch response noted bilaterally  05/01/22 -Recumbent bike L5 X 8 min -bilateral heel and toe raises 2X20 -Tandem walk 5 round trips in bars  -SLS balance 10 sec X 5 bilat -Sit to stands with calf raise 15# KB 2 X 10 -Side Stepping and diagonal stepping with red band around ankles 5 round trips in bars -Rocker board 1.5 min A-P, 1.5 min lateral -ankle DF,PF,EV red X 20 each  -E-stim russian/NMES for motor recruitment of peroneals/Tibialis anterior 5/5 sec on/off with intensity to tolerance and active EV/DF contraction/AROM for 5 sec hold during on phase in sidelying X 10 total minutes   -updated measurements and goals  04/27/22 -Nu step L8 X 8 min UE/LE -Slantboard 30 sec X 3 -bilateral heel and toe raises 2X20 -Tandem balance 30 sec X 2 bilat -SLS balance 10 sec X 5 bilat -Leg press 125# 2X15, then 75# SL 2X15 -Rocker board 1.5 min A-P, 1.5 min lateral -Sidestepping lateral in bars over obstacles x5 round trips  -ankle DF,PF,EV red X 20 each -ankle circles X 15 with 3#, ankle alphabet with 3#  X1  Manual therapy (performed by Elsie Ra PT,DPT)for skilled palpation and compression with Trigger Point Dry-Needling   Patient Consent Given: Yes Education handout provided: previously provided Muscles treated: left peroneal for muscle activation with Estim frequency 2 and intensity 3-4 to tolerance Treatment response/outcome: twitch response noted bilaterally      PATIENT EDUCATION:  Education details: exam findings and PT plan of care Person educated: Patient Education method: Explanation Education comprehension: verbalized understanding     HOME EXERCISE PROGRAM: Will review his HEP from last episode of care and revise PRN next session       ASSESSMENT:   CLINICAL IMPRESSION: Bill showed good tolerance to treatment today. He has progressed well especially with his balance, but still could use some more work with his dynamic  balance. He will continue to benefit from skilled PT to continue improving his balance as well as improving strengthening with his foot to increase functionally.   OBJECTIVE IMPAIRMENTS: decreased activity tolerance, difficulty walking, decreased balance, decreased endurance, decreased mobility, decreased ROM, decreased strength, impaired flexibility, impaired LE use, postural dysfunction, and pain.   ACTIVITY LIMITATIONS: bending, lifting, carry, locomotion, cleaning, community activity, and or occupation   PERSONAL FACTORS: Lt peroneal nerve decompression, foot drop,2020 Colon Cancer, Neuropathy in feet, PVD, prostate cancer, hernia repair are also affecting patient's functional outcome.   REHAB POTENTIAL: Fair     CLINICAL DECISION MAKING: Stable/uncomplicated   EVALUATION COMPLEXITY: Low       GOALS: Short term PT Goals Target date: 03/31/21 Pt will be I and compliant with HEP. Baseline:  Goal status: MET Pt will decrease pain by 25% overall with prolonged standing Baseline: Goal status:MET   Long term PT goals Target date: 2/29/23 Pt will improve left ankle ROM to WFL (10 deg EV and DF) in supine to improve functional mobility Baseline: Goal status: MET 04/04/22 Pt will improve  hip/knee strength to at least 5-/5 MMT in sitting, and ankle EV strength to 4/5, ankle DF strength to 4+ to improve functional strength Baseline: Goal status: MET  04/04/22 Pt will improve FOTO to at least 69% functional to show improved function Baseline: Goal status:  Discontinued, incorrect body part/set up Pt will reduce pain by overall 50% overall with usual activity and prolonged standing Baseline: Goal status: MET 04/04/22 5. Pt will improve SLS balance time on left foot to 8 seconds or more   Baseline: 2 sec but has improved to 5 sec on 05/01/22   Goal status: NEW    PLAN: PT FREQUENCY: 1-2 times per week    PT DURATION: 8-12 weeks   PLANNED INTERVENTIONS (unless contraindicated): aquatic PT,  Canalith repositioning, cryotherapy, Electrical stimulation, Iontophoresis with 4 mg/ml dexamethasome, Moist heat, traction, Ultrasound, gait training, Therapeutic exercise, balance training, neuromuscular re-education, patient/family education, prosthetic training, manual techniques, passive ROM, dry needling, taping, vasopnuematic device, vestibular, spinal manipulations, joint manipulations   PLAN FOR NEXT SESSION:  PT until end of feburary then discharge to HEP, EV strength, balance, general leg strength, DN with stim and or [place] for peroneal recuitment   Abigaile Rossie, Student-PT 05/04/2022, 8:51 AM

## 2022-05-09 ENCOUNTER — Ambulatory Visit (INDEPENDENT_AMBULATORY_CARE_PROVIDER_SITE_OTHER): Payer: Medicare Other | Admitting: Physical Therapy

## 2022-05-09 ENCOUNTER — Encounter: Payer: Self-pay | Admitting: Physical Therapy

## 2022-05-09 DIAGNOSIS — R262 Difficulty in walking, not elsewhere classified: Secondary | ICD-10-CM | POA: Diagnosis not present

## 2022-05-09 DIAGNOSIS — M6281 Muscle weakness (generalized): Secondary | ICD-10-CM

## 2022-05-09 DIAGNOSIS — M25552 Pain in left hip: Secondary | ICD-10-CM

## 2022-05-09 DIAGNOSIS — M79662 Pain in left lower leg: Secondary | ICD-10-CM | POA: Diagnosis not present

## 2022-05-09 NOTE — Therapy (Signed)
OUTPATIENT PHYSICAL THERAPY TREATMENT    Patient Name: Oscar Sparks MRN: [id number] DOB:Aug 14, 1940, 82 y.o., male Today's Date: 05/09/2022   END OF SESSION:   PT End of Session - 05/09/22 0803     Visit Number 70    Number of Visits 33    Date for PT Re-Evaluation 05/25/22    Progress Note Due on Visit 76    PT Start Time 0801    PT Stop Time [id number]    PT Time Calculation (min) 48 min    Activity Tolerance Patient tolerated treatment well                Past Medical History:  Diagnosis Date   Anemia    Arthritis    knees   Colon cancer ([place]) 09/24/2018   ED (erectile dysfunction)    Inguinal hernia    Neuromuscular disorder (HCC)    neuropathy in feet   Peripheral vascular disease ([place])    poor curculation in hands and feet hard to monitor with pulse oximetry   Prostate cancer ([place])    sees Dr. Gaynelle Arabian, received cesium seeds    Past Surgical History:  Procedure Laterality Date   BACK SURGERY     INGUINAL HERNIA REPAIR Right 05/20/2021   Procedure: OPEN RIGHT INGUINAL HERNIA REPAIR WITH MESH;  Surgeon: Jovita Kussmaul, MD;  Location: [place];  Service: General;  Laterality: Right;   SPINE SURGERY  2004   Dr. 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    PERTINENT HISTORY: Lt peroneal nerve decompression, foot drop,2020 Colon Cancer, Neuropathy in feet, PVD, prostate cancer, hernia repair   PAIN:  Are you having pain? Yes: NPRS scale: 0/10 Pain location: left hip Pain description: inconvience Aggravating factors: prolonged standing or walking Relieving factors: rest, NSAIDS   PRECAUTIONS: Fall   WEIGHT BEARING RESTRICTIONS No   FALLS:  Has patient fallen in last 6 months? No, but has had several near falls   OCCUPATION: retired   PLOF: Independent with basic ADLs   PATIENT GOALS improve walking and balance by reducing foot drop     OBJECTIVE:    DIAGNOSTIC FINDINGS: nothing recent   PATIENT SURVEYS:  FOTO 61% functional intake at eval, goal is 69%  COGNITION:           Overall cognitive status: Within functional limits for tasks assessed                              LOWER EXTREMITY ROM:   Active ROM Left eval Left 11/17/21 Left 12/06/21 Left 01/10/22 Left 01/23/22 Left 02/28/22 Left 04/04/22 Left  05/01/22  Hip flexion           Hip extension           Hip abduction           Hip adduction           Hip internal rotation           Hip external rotation           Knee flexion           Knee extension           Ankle dorsiflexion 5 7 7 7 8 10 10 10  $ Ankle plantarflexion WNL     WNL WNL WNL  Ankle inversion WNL     WNL WNL WNL  Ankle eversion 5 10 10 10 10 12 13 15   $ (Blank rows = not tested)   LOWER  EXTREMITY MMT:   MMT in sitting/supine Left eval Left 12/06/21 Left 01/10/22 Left 01/23/22 Left 02/20/22 Left 04/04/22 Left 05/01/22 in supine  Hip flexion 3+ 4       Hip extension          Hip abduction 3 4       Hip adduction          Hip internal rotation          Hip external rotation          Knee flexion          Knee extension          Ankle dorsiflexion 4 4+   4+ 5 5  Ankle plantarflexion 4 4+   4+ 4+ 5/ 3- in standing  Ankle inversion 5 5       Ankle eversion 2+ 3- 3 3+ 3+ 4 4   (Blank rows = not tested)   LOWER EXTREMITY SPECIAL TESTS:      FUNCTIONAL TESTS:  10/24/21: 5 times sit to stand: 11.74 seconds without UE support  03/28/22: SLS time 2 sec on Left, 10 sec on Rt 05/01/22: SLS time 5 seconds on left   GAIT: Distance walked: Buyer, retail device utilized: Single point cane Level of assistance: Modified independence Comments: foot drop noted on left with trendelenburg       TODAY'S TREATMENT: 05/08/22 -Recumbent bike L5 X 8 min -Slant board straight leg stretch 3x30 sec -Bilateral heel and toe raises 2X20 -Heel raises up with both down with left 2x10  -Tandem balance 15 sec x2 bilat  -SLS balance 10 sec X 5 bilat -Leg press DL #125 2x15, single leg #75 2x15 bilat -Side Stepping and diagonal stepping with red band around ankles in bars 5 round trips each  -Tandem walking in bars with touchdown support x5 round trips  -[employer] with one point switching between A-P and lateral x1.5 min with UE support  -ankle DF,PF,EV red X 20 each  -E-stim russian/NMES for motor recruitment of peroneals/Tibialis anterior 5/5 sec on/off with intensity to tolerance and active EV/DF contraction/AROM for 5 sec hold during on phase in sidelying X 10  total minutes   05/04/22 -Recumbent bike L5 X 10 min -Bilateral heel and toe raises 2X20 -Tandem balance 15 sec x2 bilat  -SLS balance 10 sec X 5 bilat -Sit to stands with calf raise 15# KB 2 X 10 -Side  Stepping and diagonal stepping with red band around ankles 5 round trips in bars -Tandem walking on airex runway in bars touchdown support x5 round trips -Rocker board 1.5 min A-P, 1.5 min lateral touchdown support  -ankle DF,PF,EV red X 20 each -ankle circles X 15 with 3#, ankle alphabet with 3#  X1   Manual therapy (performed by Elsie Ra PT,DPT)for skilled palpation and compression with Trigger Point Dry-Needling   Patient Consent Given: Yes Education handout provided: previously provided Muscles treated: left peroneal for muscle activation with Estim frequency 2 and intensity 3-4 to tolerance Treatment response/outcome: twitch response noted bilaterally  05/01/22 -Recumbent bike L5 X 8 min -bilateral heel and toe raises 2X20 -Tandem walk 5 round trips in bars  -SLS balance 10 sec X 5 bilat -Sit to stands with calf raise 15# KB 2 X 10 -Side Stepping and diagonal stepping with red band around ankles 5 round trips in bars -Rocker board 1.5 min A-P, 1.5 min lateral -ankle DF,PF,EV red X 20 each  -E-stim russian/NMES for motor recruitment of peroneals/Tibialis anterior 5/5 sec on/off with intensity to tolerance and active EV/DF contraction/AROM for 5 sec hold during on phase in sidelying X 10 total minutes   -updated measurements and goals    PATIENT EDUCATION:  Education details: exam findings and PT plan of care Person educated: Patient Education method: Explanation Education comprehension: verbalized understanding     HOME EXERCISE PROGRAM: Will review his HEP from last episode of care and revise PRN next session     ASSESSMENT:   CLINICAL IMPRESSION: Bill tolerated treatment well today. He has shown good progress in strength and ROM of the left foot, but still shows some some deficits. His balance was challenged a little more with the rocker board with one point today which he needed more UE support. He will continue to benefit from skilled PT to address these limitations  and improve functionally.   OBJECTIVE IMPAIRMENTS: decreased activity tolerance, difficulty walking, decreased balance, decreased endurance, decreased mobility, decreased ROM, decreased strength, impaired flexibility, impaired LE use, postural dysfunction, and pain.   ACTIVITY LIMITATIONS: bending, lifting, carry, locomotion, cleaning, community activity, and or occupation   PERSONAL FACTORS: Lt peroneal nerve decompression, foot drop,2020 Colon Cancer, Neuropathy in feet, PVD, prostate cancer, hernia repair are also affecting patient's functional outcome.   REHAB POTENTIAL: Fair     CLINICAL DECISION MAKING: Stable/uncomplicated   EVALUATION COMPLEXITY: Low       GOALS: Short term PT Goals Target date: 03/31/21 Pt will be I and compliant with HEP. Baseline:  Goal status: MET Pt will decrease pain by 25% overall with prolonged standing Baseline: Goal status:MET   Long term PT goals Target date: 2/29/23 Pt will improve left ankle ROM to WFL (10 deg EV and DF) in supine to improve functional mobility Baseline: Goal status: MET 04/04/22 Pt will improve  hip/knee strength to at least 5-/5 MMT in sitting, and ankle EV strength to 4/5, ankle DF strength to 4+ to improve functional strength Baseline: Goal status: MET  04/04/22 Pt will improve FOTO to at least 69% functional to show improved function Baseline: Goal status: Discontinued, incorrect body part/set up Pt will reduce pain by overall 50% overall with usual activity and prolonged  standing Baseline: Goal status: MET 04/04/22 5. Pt will improve SLS balance time on left foot to 8 seconds or more   Baseline: 2 sec but has improved to 5 sec on 05/01/22   Goal status: NEW    PLAN: PT FREQUENCY: 1-2 times per week    PT DURATION: 8-12 weeks   PLANNED INTERVENTIONS (unless contraindicated): aquatic PT, Canalith repositioning, cryotherapy, Electrical stimulation, Iontophoresis with 4 mg/ml dexamethasome, Moist heat, traction,  Ultrasound, gait training, Therapeutic exercise, balance training, neuromuscular re-education, patient/family education, prosthetic training, manual techniques, passive ROM, dry needling, taping, vasopnuematic device, vestibular, spinal manipulations, joint manipulations   PLAN FOR NEXT SESSION:  PT until end of feburary then discharge to HEP, EV strength, balance progressions, general leg strength, DN with stim and or [place] for peroneal recuitment   Jacey Pelc, Student-PT 05/09/2022, 9:03 AM

## 2022-05-11 ENCOUNTER — Encounter: Payer: Self-pay | Admitting: Physical Therapy

## 2022-05-11 ENCOUNTER — Ambulatory Visit (INDEPENDENT_AMBULATORY_CARE_PROVIDER_SITE_OTHER): Payer: Medicare Other | Admitting: Physical Therapy

## 2022-05-11 DIAGNOSIS — R262 Difficulty in walking, not elsewhere classified: Secondary | ICD-10-CM

## 2022-05-11 DIAGNOSIS — M6281 Muscle weakness (generalized): Secondary | ICD-10-CM | POA: Diagnosis not present

## 2022-05-11 DIAGNOSIS — M25552 Pain in left hip: Secondary | ICD-10-CM

## 2022-05-11 DIAGNOSIS — M79662 Pain in left lower leg: Secondary | ICD-10-CM | POA: Diagnosis not present

## 2022-05-11 NOTE — Therapy (Signed)
OUTPATIENT PHYSICAL THERAPY TREATMENT    Patient Name: Oscar Sparks MRN: [id number] DOB:1940/07/11, 82 y.o., male Today's Date: 05/11/2022   END OF SESSION:   PT End of Session - 05/11/22 0832     Visit Number 61    Number of Visits 57    Date for PT Re-Evaluation 05/25/22    Authorization Type MCR    Progress Note Due on Visit 16    PT Start Time 0835    PT Stop Time 0920    PT Time Calculation (min) 45 min    Activity Tolerance Patient tolerated treatment well                Past Medical History:  Diagnosis Date   Anemia    Arthritis    knees   Colon cancer ([place]) 09/24/2018   ED (erectile dysfunction)    Inguinal hernia    Neuromuscular disorder (HCC)    neuropathy in feet   Peripheral vascular disease ([facility])    poor curculation in hands and feet hard to monitor with pulse oximetry   Prostate cancer ([place])    sees Dr. Gaynelle Arabian, received cesium seeds    Past Surgical History:  Procedure Laterality Date   BACK SURGERY     INGUINAL HERNIA REPAIR Right 05/20/2021   Procedure: OPEN RIGHT INGUINAL HERNIA REPAIR WITH MESH;  Surgeon: Jovita Kussmaul, MD;  Location: [place];  Service: General;  Laterality: Right;   SPINE SURGERY  2004   Dr. 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  PERTINENT HISTORY: Lt peroneal nerve decompression, foot drop,2020 Colon Cancer, Neuropathy in feet, PVD, prostate cancer, hernia repair   PAIN:  Are you having pain? Yes: NPRS scale: 0/10 Pain location: left hip Pain description: inconvience Aggravating factors: prolonged standing or walking Relieving factors: rest, NSAIDS   PRECAUTIONS: Fall   WEIGHT BEARING RESTRICTIONS No   FALLS:  Has patient fallen in last 6 months? No, but has had several near falls   OCCUPATION: retired   PLOF: Independent with basic ADLs   PATIENT GOALS improve walking and balance by reducing foot drop     OBJECTIVE:    DIAGNOSTIC FINDINGS: nothing recent   PATIENT SURVEYS:  FOTO 61% functional intake at eval, goal is 69%  COGNITION:           Overall cognitive status: Within functional limits for tasks assessed                              LOWER EXTREMITY ROM:   Active ROM Left eval Left 11/17/21 Left 12/06/21 Left 01/10/22 Left 01/23/22 Left 02/28/22 Left 04/04/22 Left  05/01/22  Hip flexion           Hip extension           Hip abduction           Hip adduction           Hip internal rotation           Hip external rotation           Knee flexion           Knee extension           Ankle dorsiflexion 5 7 7 7 8 10 10 10  $ Ankle plantarflexion WNL     WNL WNL WNL  Ankle inversion WNL     WNL WNL WNL  Ankle eversion 5 10 10 10 10 12 13 15   $ (Blank rows = not  tested)   LOWER EXTREMITY MMT:   MMT in sitting/supine Left eval Left 12/06/21 Left 01/10/22 Left 01/23/22 Left 02/20/22 Left 04/04/22 Left 05/01/22 in supine  Hip flexion 3+ 4       Hip extension          Hip abduction 3 4       Hip adduction          Hip internal rotation          Hip external rotation          Knee flexion          Knee extension          Ankle dorsiflexion 4 4+   4+ 5 5  Ankle plantarflexion 4 4+   4+ 4+ 5/ 3- in standing  Ankle inversion 5 5       Ankle eversion 2+ 3- 3 3+ 3+ 4 4   (Blank rows = not tested)   LOWER EXTREMITY SPECIAL TESTS:      FUNCTIONAL TESTS:  10/24/21: 5 times sit to stand: 11.74 seconds without UE support  03/28/22: SLS time 2 sec on Left, 10 sec on Rt 05/01/22: SLS time 5 seconds on left   GAIT: Distance walked: Buyer, retail device utilized: Single point cane Level of assistance: Modified independence Comments: foot drop noted on left with trendelenburg       TODAY'S TREATMENT: 05/11/22 -Recumbent bike L5 X 8 min  -Slant board straight leg stretch 3x30 sec -Bilateral heel and toe raises 2X20 -Heel raises up with both down with left 2x10  -Tandem balance 15 sec x2 bilat  -SLS balance 10 sec X 5 bilat -Leg press DL #125 2x15, single leg #75 2x15 bilat -Side Stepping and diagonal stepping with red band around ankles in bars 5 round trips each  -Rocker board A-P x1.45mn, lateral x1.5 min with touchdown support -ankle DF,PF,EV red X 20 each  Manual therapy (performed by BElsie RaPT,DPT)for skilled palpation and compression with Trigger Point Dry-Needling   Patient Consent Given: Yes Education handout provided: previously provided Muscles treated: left peroneal for muscle activation with Estim frequency 2 and intensity 3-4 to tolerance Treatment response/outcome: twitch response  noted bilaterally  05/08/22 -Recumbent bike L5 X 8 min -Slant board straight leg stretch 3x30 sec -Bilateral heel and toe raises  2X20 -Heel raises up with both down with left 2x10  -Tandem balance 15 sec x2 bilat  -SLS balance 10 sec X 5 bilat -Leg press DL #125 2x15, single leg #75 2x15 bilat -Side Stepping and diagonal stepping with red band around ankles in bars 5 round trips each  -Tandem walking in bars with touchdown support x5 round trips  -[employer] with one point switching between A-P and lateral x1.5 min with UE support  -ankle DF,PF,EV red X 20 each  -E-stim russian/NMES for motor recruitment of peroneals/Tibialis anterior 5/5 sec on/off with intensity to tolerance and active EV/DF contraction/AROM for 5 sec hold during on phase in sidelying X 10 total minutes   05/04/22 -Recumbent bike L5 X 10 min -Bilateral heel and toe raises 2X20 -Tandem balance 15 sec x2 bilat  -SLS balance 10 sec X 5 bilat -Sit to stands with calf raise 15# KB 2 X 10 -Side Stepping and diagonal stepping with red band around ankles 5 round trips in bars -Tandem walking on airex runway in bars touchdown support x5 round trips -Rocker board 1.5 min A-P, 1.5 min lateral touchdown support  -ankle DF,PF,EV red X 20 each -ankle circles X 15 with 3#, ankle alphabet with 3#  X1   Manual therapy (performed by Elsie Ra PT,DPT)for skilled palpation and compression with Trigger Point Dry-Needling   Patient Consent Given: Yes Education handout provided: previously provided Muscles treated: left peroneal for muscle activation with Estim frequency 2 and intensity 3-4 to tolerance Treatment response/outcome: twitch response noted bilaterally    PATIENT EDUCATION:  Education details: exam findings and PT plan of care Person educated: Patient Education method: Explanation Education comprehension: verbalized understanding     HOME EXERCISE PROGRAM: Will review his HEP from last episode of care and revise PRN next session     ASSESSMENT:   CLINICAL IMPRESSION: Oscar Sparks continues to show good tolerance to his treatment sessions. He is  scheduled out until the end of February and will benefit from skilled PT to maximize his function and improve in main areas of eversion and plantar flexion strength and balance. We will then transition him to independent program.  OBJECTIVE IMPAIRMENTS: decreased activity tolerance, difficulty walking, decreased balance, decreased endurance, decreased mobility, decreased ROM, decreased strength, impaired flexibility, impaired LE use, postural dysfunction, and pain.   ACTIVITY LIMITATIONS: bending, lifting, carry, locomotion, cleaning, community activity, and or occupation   PERSONAL FACTORS: Lt peroneal nerve decompression, foot drop,2020 Colon Cancer, Neuropathy in feet, PVD, prostate cancer, hernia repair are also affecting patient's functional outcome.   REHAB POTENTIAL: Fair     CLINICAL DECISION MAKING: Stable/uncomplicated   EVALUATION COMPLEXITY: Low       GOALS: Short term PT Goals Target date: 03/31/21 Pt will be I and compliant with HEP. Baseline:  Goal status: MET Pt will decrease pain by 25% overall with prolonged standing Baseline: Goal status:MET   Long term PT goals Target date: 2/29/23 Pt will improve left ankle ROM to WFL (10 deg EV and DF) in supine to improve functional mobility Baseline: Goal status: MET 04/04/22 Pt will improve  hip/knee strength to at least 5-/5 MMT in sitting, and ankle EV strength to 4/5, ankle DF strength to 4+ to improve functional strength Baseline: Goal status: MET  04/04/22 Pt will improve FOTO to at least 69% functional to show improved function Baseline: Goal  status: Discontinued, incorrect body part/set up Pt will reduce pain by overall 50% overall with usual activity and prolonged standing Baseline: Goal status: MET 04/04/22 5. Pt will improve SLS balance time on left foot to 8 seconds or more   Baseline: 2 sec but has improved to 5 sec on 05/01/22   Goal status: NEW    PLAN: PT FREQUENCY: 1-2 times per week    PT DURATION: 8-12  weeks   PLANNED INTERVENTIONS (unless contraindicated): aquatic PT, Canalith repositioning, cryotherapy, Electrical stimulation, Iontophoresis with 4 mg/ml dexamethasome, Moist heat, traction, Ultrasound, gait training, Therapeutic exercise, balance training, neuromuscular re-education, patient/family education, prosthetic training, manual techniques, passive ROM, dry needling, taping, vasopnuematic device, vestibular, spinal manipulations, joint manipulations   PLAN FOR NEXT SESSION:  PT until end of feburary then discharge to HEP, EV strength, balance progressions, general leg strength, DN with stim and or [place] for peroneal recuitment   Oscar Sparks, Student-PT 05/11/2022, 9:20 AM  During this treatment session, this physical therapist was present, participating in and directing the treatment.   This note has been reviewed and this clinician agrees with the information provided.  Elsie Ra, PT, DPT 05/11/22 11:45 AM

## 2022-05-16 ENCOUNTER — Encounter: Payer: Self-pay | Admitting: Physical Therapy

## 2022-05-16 ENCOUNTER — Ambulatory Visit (INDEPENDENT_AMBULATORY_CARE_PROVIDER_SITE_OTHER): Payer: Medicare Other | Admitting: Physical Therapy

## 2022-05-16 ENCOUNTER — Telehealth: Payer: Self-pay | Admitting: Physical Therapy

## 2022-05-16 DIAGNOSIS — M79662 Pain in left lower leg: Secondary | ICD-10-CM

## 2022-05-16 DIAGNOSIS — M6281 Muscle weakness (generalized): Secondary | ICD-10-CM | POA: Diagnosis not present

## 2022-05-16 DIAGNOSIS — M25552 Pain in left hip: Secondary | ICD-10-CM

## 2022-05-16 NOTE — Therapy (Signed)
OUTPATIENT PHYSICAL THERAPY TREATMENT    Patient Name: Oscar Sparks MRN: [id number] DOB:01-29-1941, 82 y.o., male Today's Date: 05/16/2022   END OF SESSION:   PT End of Session - 05/16/22 0856     Visit Number 50    Number of Visits 59    Date for PT Re-Evaluation 05/25/22    Authorization Type MCR    Progress Note Due on Visit 36    PT Start Time 0842    PT Stop Time 0927    PT Time Calculation (min) 45 min    Activity Tolerance Patient tolerated treatment well    Behavior During Therapy [facility] for tasks assessed/performed                Past Medical History:  Diagnosis Date   Anemia    Arthritis    knees   Colon cancer ([place]) 09/24/2018   ED (erectile dysfunction)    Inguinal hernia    Neuromuscular disorder (HCC)    neuropathy in feet   Peripheral vascular disease (HCC)    poor curculation in hands and feet hard to monitor with pulse oximetry   Prostate cancer ([place])    sees Dr. Gaynelle Arabian, received cesium seeds    Past Surgical History:  Procedure Laterality Date   BACK SURGERY     INGUINAL HERNIA REPAIR Right 05/20/2021   Procedure: OPEN RIGHT INGUINAL HERNIA REPAIR WITH MESH;  Surgeon: Jovita Kussmaul, MD;  Location: [place];  Service: General;  Laterality: Right;   SPINE SURGERY  2004   Dr. 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  PERTINENT HISTORY: Lt peroneal nerve decompression, foot drop,2020 Colon Cancer, Neuropathy in feet, PVD, prostate cancer, hernia repair   PAIN:  Are you having pain? Yes: NPRS scale: 0/10 Pain location: left hip Pain description: inconvience Aggravating factors: prolonged standing or walking Relieving factors: rest, NSAIDS   PRECAUTIONS: Fall   WEIGHT BEARING RESTRICTIONS No   FALLS:  Has patient fallen in last 6 months? No, but has had several near falls   OCCUPATION: retired   PLOF: Independent with basic ADLs   PATIENT GOALS improve walking and balance by reducing foot drop     OBJECTIVE:    DIAGNOSTIC FINDINGS: nothing recent   PATIENT SURVEYS:  FOTO 61% functional intake at eval,  goal is 69%   COGNITION:           Overall cognitive status: Within functional limits for tasks assessed                              LOWER EXTREMITY ROM:   Active ROM Left eval Left 11/17/21 Left 12/06/21 Left 01/10/22 Left 01/23/22 Left 02/28/22 Left 04/04/22 Left  05/01/22  Hip flexion           Hip extension           Hip abduction           Hip adduction           Hip internal rotation           Hip external rotation           Knee flexion           Knee extension           Ankle dorsiflexion 5 7 7 7 8 10 10 10  $ Ankle plantarflexion WNL     WNL WNL WNL  Ankle inversion WNL     WNL WNL WNL  Ankle eversion 5 10 10 10 10 12 13 15    $ (Blank rows = not tested)   LOWER EXTREMITY MMT:   MMT in sitting/supine Left eval Left 12/06/21 Left 01/10/22 Left 01/23/22 Left 02/20/22 Left 04/04/22 Left 05/01/22 in supine  Hip flexion 3+ 4       Hip extension          Hip abduction 3 4       Hip adduction          Hip internal rotation          Hip external rotation          Knee flexion          Knee extension          Ankle dorsiflexion 4 4+   4+ 5 5  Ankle plantarflexion 4 4+   4+ 4+ 5/ 3- in standing  Ankle inversion 5 5       Ankle eversion 2+ 3- 3 3+ 3+ 4 4   (Blank rows = not tested)   LOWER EXTREMITY SPECIAL TESTS:      FUNCTIONAL TESTS:  10/24/21: 5 times sit to stand: 11.74 seconds without UE support  03/28/22: SLS time 2 sec on Left, 10 sec on Rt 05/01/22: SLS time 5 seconds on left   GAIT: Distance walked: Buyer, retail device utilized: Single point cane Level of assistance: Modified independence Comments: foot drop noted on left with trendelenburg       TODAY'S TREATMENT: 05/16/22 -Recumbent bike L5 X 8 min -Slant board straight leg stretch 3x30 sec -Bilateral heel and toe raises 2X20 -Heel raises up with both down with left 2x10  -Tandem balance 15 sec x2 bilat  -SLS balance 10 sec X 5 bilat -Leg press DL #125 2x15, single leg #75 2x15 bilat -Side Stepping and diagonal stepping with red band around ankles in bars 5 round trips each  -Tandem walking in bars with touchdown support x5 round trips  -[employer] with one point, 1 min each of A-P, lateral, circles with UE support  -ankle DF,PF,EV red X 20 each  -E-stim russian/NMES for motor recruitment of peroneals/Tibialis anterior 5/5 sec on/off with intensity to tolerance and active EV/DF contraction/AROM for 5 sec hold during on  phase in sidelying X 10 total minutes   05/11/22 -Recumbent bike L5 X 8 min  -Slant board straight leg stretch 3x30 sec -Bilateral heel and toe raises 2X20 -Heel raises up with both down with left 2x10   -Tandem balance 15 sec x2 bilat  -SLS balance 10 sec X 5 bilat -Leg press DL #125 2x15, single leg #75 2x15 bilat -Side Stepping and diagonal stepping with red band around ankles in bars 5 round trips each  -Rocker board A-P x1.92mn, lateral x1.5 min with touchdown support -ankle DF,PF,EV red X 20 each  Manual therapy (performed by BElsie RaPT,DPT)for skilled palpation and compression with Trigger Point Dry-Needling   Patient Consent Given: Yes Education handout provided: previously provided Muscles treated: left peroneal for muscle activation with Estim frequency 2 and intensity 3-4 to tolerance Treatment response/outcome: twitch response noted bilaterally      PATIENT EDUCATION:  Education details: exam findings and PT plan of care Person educated: Patient Education method: Explanation Education comprehension: verbalized understanding     HOME EXERCISE PROGRAM: Will review his HEP from last episode of care and revise PRN next session     ASSESSMENT:   CLINICAL IMPRESSION: Bill arrived at the wrong time but we were able to work him in today due to another clinician having an opening. He continues to show good effort with PT and remains highly motivated. We will plan to see him until the end of February and work to transition him to independent program.   OBJECTIVE IMPAIRMENTS: decreased activity tolerance, difficulty walking, decreased balance, decreased endurance, decreased mobility, decreased ROM, decreased strength, impaired flexibility, impaired LE use, postural dysfunction, and pain.   ACTIVITY LIMITATIONS: bending, lifting, carry, locomotion, cleaning, community activity, and or occupation   PERSONAL FACTORS: Lt peroneal nerve decompression, foot drop,2020 Colon Cancer, Neuropathy in feet, PVD, prostate cancer, hernia repair are also affecting patient's functional outcome.   REHAB POTENTIAL: Fair     CLINICAL DECISION MAKING: Stable/uncomplicated   EVALUATION  COMPLEXITY: Low       GOALS: Short term PT Goals Target date: 03/31/21 Pt will be I and compliant with HEP. Baseline:  Goal status: MET Pt will decrease pain by 25% overall with prolonged standing Baseline: Goal status:MET   Long term PT goals Target date: 2/29/23 Pt will improve left ankle ROM to WFL (10 deg EV and DF) in supine to improve functional mobility Baseline: Goal status: MET 04/04/22 Pt will improve  hip/knee strength to at least 5-/5 MMT in sitting, and ankle EV strength to 4/5, ankle DF strength to 4+ to improve functional strength Baseline: Goal status: MET  04/04/22 Pt will improve FOTO to at least 69% functional to show improved function Baseline: Goal status: Discontinued, incorrect body part/set up Pt will reduce pain by overall 50% overall with usual activity and prolonged standing Baseline: Goal status: MET 04/04/22 5. Pt will improve SLS balance time on left foot to 8 seconds or more   Baseline: 2 sec but has improved to 5 sec on 05/01/22   Goal status: NEW    PLAN: PT FREQUENCY: 1-2 times per week    PT DURATION: 8-12 weeks   PLANNED INTERVENTIONS (unless contraindicated): aquatic PT, Canalith repositioning, cryotherapy, Electrical stimulation, Iontophoresis with 4 mg/ml dexamethasome, Moist heat, traction, Ultrasound, gait training, Therapeutic exercise, balance training, neuromuscular re-education, patient/family education, prosthetic training, manual techniques, passive ROM, dry needling, taping, vasopnuematic device, vestibular, spinal manipulations, joint manipulations   PLAN FOR NEXT SESSION:  PT until end of  feburary then discharge to HEP, EV strength, balance progressions, general leg strength, DN with stim and or [place] for peroneal recuitment   Debbe Odea, PT,DPT 05/16/2022, 8:56 AM

## 2022-05-16 NOTE — Telephone Encounter (Addendum)
Patient did not arrive for treatment session today. Left a voicemail and reminded him of his next appointment on Thursday 2/22 at 8am. He later arrived at 845 and had his appointment time mixed up, we were able to to still see him for physical therapy then.   Elsie Ra, PT, DPT 05/16/22 8:49 AM

## 2022-05-18 ENCOUNTER — Ambulatory Visit (INDEPENDENT_AMBULATORY_CARE_PROVIDER_SITE_OTHER): Payer: Medicare Other | Admitting: Physical Therapy

## 2022-05-18 ENCOUNTER — Encounter: Payer: Self-pay | Admitting: Physical Therapy

## 2022-05-18 DIAGNOSIS — R262 Difficulty in walking, not elsewhere classified: Secondary | ICD-10-CM

## 2022-05-18 DIAGNOSIS — M6281 Muscle weakness (generalized): Secondary | ICD-10-CM

## 2022-05-18 DIAGNOSIS — M25552 Pain in left hip: Secondary | ICD-10-CM

## 2022-05-18 DIAGNOSIS — M79662 Pain in left lower leg: Secondary | ICD-10-CM | POA: Diagnosis not present

## 2022-05-18 NOTE — Therapy (Signed)
OUTPATIENT PHYSICAL THERAPY TREATMENT    Patient Name: Oscar Sparks MRN: [id number] DOB:1941/02/19, 82 y.o., male Today's Date: 05/18/2022   END OF SESSION:   PT End of Session - 05/18/22 0816     Visit Number 51    Number of Visits 76    Date for PT Re-Evaluation 05/25/22    Authorization Type MCR    Progress Note Due on Visit 54    PT Start Time 0800    PT Stop Time 0845    PT Time Calculation (min) 45 min    Activity Tolerance Patient tolerated treatment well    Behavior During Therapy [facility] for tasks assessed/performed                Past Medical History:  Diagnosis Date   Anemia    Arthritis    knees   Colon cancer ([place]) 09/24/2018   ED (erectile dysfunction)    Inguinal hernia    Neuromuscular disorder (HCC)    neuropathy in feet   Peripheral vascular disease (HCC)    poor curculation in hands and feet hard to monitor with pulse oximetry   Prostate cancer ([place])    sees Oscar Sparks, received cesium seeds    Past Surgical History:  Procedure Laterality Date   BACK SURGERY     INGUINAL HERNIA REPAIR Right 05/20/2021   Procedure: OPEN RIGHT INGUINAL HERNIA REPAIR WITH MESH;  Surgeon: Oscar Kussmaul, MD;  Location: [place];  Service: General;  Laterality: Right;   SPINE SURGERY  2004   Dr. 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  PERTINENT HISTORY: Lt peroneal nerve decompression, foot drop,2020 Colon Cancer, Neuropathy in feet, PVD, prostate cancer, hernia repair   PAIN:  Are you having pain? Yes: NPRS scale: 0/10 Pain location: left hip Pain description: inconvience Aggravating factors: prolonged standing or walking Relieving factors: rest, NSAIDS   PRECAUTIONS: Fall   WEIGHT BEARING RESTRICTIONS No   FALLS:  Has patient fallen in last 6 months? No, but has had several near falls   OCCUPATION: retired   PLOF: Independent with basic ADLs   PATIENT GOALS improve walking and balance by reducing foot drop     OBJECTIVE:    DIAGNOSTIC FINDINGS: nothing recent   PATIENT SURVEYS:  FOTO 61% functional intake at eval, goal is 69%   COGNITION:           Overall cognitive status: Within functional limits for tasks assessed                              LOWER EXTREMITY ROM:   Active ROM Left eval Left 11/17/21 Left 12/06/21 Left 01/10/22 Left 01/23/22 Left 02/28/22 Left 04/04/22 Left  05/01/22  Hip flexion           Hip extension           Hip abduction           Hip adduction           Hip internal rotation           Hip external rotation           Knee flexion           Knee extension           Ankle dorsiflexion 5 7 7 7 8 10 10 10  $ Ankle plantarflexion WNL     WNL WNL WNL  Ankle inversion WNL     WNL WNL  WNL  Ankle eversion 5 10 10 10 10 12 13 15   $ (Blank rows = not tested)   LOWER EXTREMITY MMT:   MMT in sitting/supine Left eval Left 12/06/21 Left 01/10/22 Left 01/23/22 Left 02/20/22 Left 04/04/22 Left 05/01/22 in supine  Hip flexion 3+ 4       Hip extension          Hip abduction 3 4       Hip adduction          Hip internal rotation          Hip external rotation          Knee flexion          Knee extension          Ankle dorsiflexion 4 4+   4+ 5 5  Ankle plantarflexion 4 4+   4+ 4+ 5/ 3- in standing  Ankle inversion 5 5       Ankle eversion 2+ 3- 3 3+ 3+ 4 4   (Blank rows = not tested)   LOWER EXTREMITY SPECIAL TESTS:      FUNCTIONAL TESTS:  10/24/21: 5 times sit to stand: 11.74 seconds without UE support  03/28/22: SLS time 2 sec on Left, 10 sec on Rt 05/01/22: SLS time 5 seconds on left   GAIT: Distance walked: Buyer, retail device utilized: Single point cane Level of assistance: Modified independence Comments: foot drop noted on left with trendelenburg       TODAY'S TREATMENT: 05/18/22 -Recumbent bike L5 X 8 min  -Slant board straight leg stretch 3x30 sec -Bilateral heel and toe raises 2X20 -Heel raises up with both down with left 2x10  -Tandem balance 15 sec x2 bilat  -SLS balance 10 sec X 5 bilat -Leg press DL #125 2x15, single leg #75 2x15 bilat -Side Stepping and diagonal stepping with red band around ankles in bars 5 round trips each  -Rocker board A-P x1.75mn, lateral x1.5 min with touchdown support -ankle DF,PF,EV red X 20 each  Manual therapy (performed by BElsie Sparks)for skilled palpation and compression with Trigger Point Dry-Needling   Patient Consent Given: Yes Education handout provided: previously provided Muscles treated: left peroneal for muscle activation with Estim  frequency 2 and intensity 3-4 to tolerance Treatment response/outcome: twitch response noted bilaterally  05/16/22 -Recumbent bike L5 X 8 min -Slant  board straight leg stretch 3x30 sec -Bilateral heel and toe raises 2X20 -Heel raises up with both down with left 2x10  -Tandem balance 15 sec x2 bilat  -SLS balance 10 sec X 5 bilat -Leg press DL #125 2x15, single leg #75 2x15 bilat -Side Stepping and diagonal stepping with red band around ankles in bars 5 round trips each  -Tandem walking in bars with touchdown support x5 round trips  -[employer] with one point, 1 min each of A-P, lateral, circles with UE support  -ankle DF,PF,EV red X 20 each  -E-stim russian/NMES for motor recruitment of peroneals/Tibialis anterior 5/5 sec on/off with intensity to tolerance and active EV/DF contraction/AROM for 5 sec hold during on phase in sidelying X 10 total minutes       PATIENT EDUCATION:  Education details: exam findings and PT plan of care Person educated: Patient Education method: Explanation Education comprehension: verbalized understanding     HOME EXERCISE PROGRAM: Will review his HEP from last episode of care and revise PRN next session     ASSESSMENT:   CLINICAL IMPRESSION: He is responding well to ankle strength and stability program. We will plan to see him until the end of February and work to transition him to independent program.   OBJECTIVE IMPAIRMENTS: decreased activity tolerance, difficulty walking, decreased balance, decreased endurance, decreased mobility, decreased ROM, decreased strength, impaired flexibility, impaired LE use, postural dysfunction, and pain.   ACTIVITY LIMITATIONS: bending, lifting, carry, locomotion, cleaning, community activity, and or occupation   PERSONAL FACTORS: Lt peroneal nerve decompression, foot drop,2020 Colon Cancer, Neuropathy in feet, PVD, prostate cancer, hernia repair are also affecting patient's functional outcome.   REHAB POTENTIAL: Fair     CLINICAL DECISION MAKING: Stable/uncomplicated   EVALUATION COMPLEXITY: Low       GOALS: Short term PT Goals Target date: 03/31/21 Pt  will be I and compliant with HEP. Baseline:  Goal status: MET Pt will decrease pain by 25% overall with prolonged standing Baseline: Goal status:MET   Long term PT goals Target date: 2/29/23 Pt will improve left ankle ROM to WFL (10 deg EV and DF) in supine to improve functional mobility Baseline: Goal status: MET 04/04/22 Pt will improve  hip/knee strength to at least 5-/5 MMT in sitting, and ankle EV strength to 4/5, ankle DF strength to 4+ to improve functional strength Baseline: Goal status: MET  04/04/22 Pt will improve FOTO to at least 69% functional to show improved function Baseline: Goal status: Discontinued, incorrect body part/set up Pt will reduce pain by overall 50% overall with usual activity and prolonged standing Baseline: Goal status: MET 04/04/22 5. Pt will improve SLS balance time on left foot to 8 seconds or more   Baseline: 2 sec but has improved to 5 sec on 05/01/22   Goal status: Sparks    PLAN: PT FREQUENCY: 1-2 times per week    PT DURATION: 8-12 weeks   PLANNED INTERVENTIONS (unless contraindicated): aquatic PT, Canalith repositioning, cryotherapy, Electrical stimulation, Iontophoresis with 4 mg/ml dexamethasome, Moist heat, traction, Ultrasound, gait training, Therapeutic exercise, balance training, neuromuscular re-education, patient/family education, prosthetic training, manual techniques, passive ROM, dry needling, taping, vasopnuematic device, vestibular, spinal manipulations, joint manipulations   PLAN FOR NEXT SESSION:  PT until end of feburary then discharge to HEP, EV strength, balance progressions, general leg strength, DN with stim and or [place]  for peroneal recuitment   Debbe Odea, PT,DPT 05/18/2022, 8:17 AM

## 2022-05-23 ENCOUNTER — Ambulatory Visit (INDEPENDENT_AMBULATORY_CARE_PROVIDER_SITE_OTHER): Payer: Medicare Other | Admitting: Physical Therapy

## 2022-05-23 ENCOUNTER — Encounter: Payer: Self-pay | Admitting: Physical Therapy

## 2022-05-23 DIAGNOSIS — M25552 Pain in left hip: Secondary | ICD-10-CM

## 2022-05-23 DIAGNOSIS — R262 Difficulty in walking, not elsewhere classified: Secondary | ICD-10-CM

## 2022-05-23 DIAGNOSIS — M79662 Pain in left lower leg: Secondary | ICD-10-CM | POA: Diagnosis not present

## 2022-05-23 DIAGNOSIS — M6281 Muscle weakness (generalized): Secondary | ICD-10-CM | POA: Diagnosis not present

## 2022-05-23 NOTE — Therapy (Signed)
OUTPATIENT PHYSICAL THERAPY TREATMENT    Patient Name: Oscar Sparks MRN: [id number] DOB:[birth year], 82 y.o., male Today's Date: 05/23/2022   END OF SESSION:   PT End of Session - 05/23/22 0805     Visit Number 58    Number of Visits 72    Date for PT Re-Evaluation 05/25/22    Authorization Type MCR    Progress Note Due on Visit 24    PT Start Time 0802    PT Stop Time 0848    PT Time Calculation (min) 46 min    Activity Tolerance Patient tolerated treatment well    Behavior During Therapy [facility] for tasks assessed/performed                Past Medical History:  Diagnosis Date   Anemia    Arthritis    knees   Colon cancer ([place]) 09/24/2018   ED (erectile dysfunction)    Inguinal hernia    Neuromuscular disorder (HCC)    neuropathy in feet   Peripheral vascular disease (HCC)    poor curculation in hands and feet hard to monitor with pulse oximetry   Prostate cancer ([place])    sees Dr. Gaynelle Arabian, received cesium seeds    Past Surgical History:  Procedure Laterality Date   BACK SURGERY     INGUINAL HERNIA REPAIR Right 05/20/2021   Procedure: OPEN RIGHT INGUINAL HERNIA REPAIR WITH MESH;  Surgeon: Jovita Kussmaul, MD;  Location: [place];  Service: General;  Laterality: Right;   SPINE SURGERY  2004   Dr. 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  PERTINENT HISTORY: Lt peroneal nerve decompression, foot drop, 2020 Colon Cancer, Neuropathy in feet, PVD, prostate cancer, hernia repair   PAIN:  Are you having pain? Yes: NPRS scale: 0/10 Pain location: left hip Pain description: inconvience Aggravating factors: prolonged standing or walking Relieving factors: rest, NSAIDS   PRECAUTIONS: Fall   WEIGHT BEARING RESTRICTIONS No   FALLS:  Has patient fallen in last 6 months? No, but has had several near falls   OCCUPATION: retired   PLOF: Independent with basic ADLs   PATIENT GOALS improve walking and balance by reducing foot drop     OBJECTIVE:    DIAGNOSTIC FINDINGS: nothing recent   PATIENT SURVEYS:  FOTO 61% functional intake at eval, goal is 69%   COGNITION:           Overall cognitive status: Within functional limits for tasks assessed                              LOWER EXTREMITY ROM:   Active ROM Left eval Left 11/17/21 Left 12/06/21 Left 01/10/22 Left 01/23/22 Left 02/28/22 Left 04/04/22 Left  05/01/22  Hip flexion           Hip extension           Hip abduction           Hip adduction           Hip internal rotation           Hip external rotation           Knee flexion           Knee extension           Ankle dorsiflexion '5 7 7 7 8 10 10 10  '$ Ankle plantarflexion WNL     WNL WNL WNL  Ankle inversion WNL     WNL WNL WNL  Ankle  eversion '5 10 10 10 10 12 13 15   '$ (Blank rows = not tested)   LOWER EXTREMITY MMT:   MMT in sitting/supine Left eval Left 12/06/21 Left 01/10/22 Left 01/23/22 Left 02/20/22 Left 04/04/22 Left 05/01/22 in supine  Hip flexion 3+ 4       Hip extension          Hip abduction 3 4       Hip adduction          Hip internal rotation          Hip external rotation          Knee flexion          Knee extension          Ankle dorsiflexion 4 4+   4+ 5 5  Ankle plantarflexion 4 4+   4+ 4+ 5/ 3- in standing  Ankle inversion 5 5       Ankle eversion 2+ 3- 3 3+ 3+ 4 4   (Blank rows = not tested)   LOWER EXTREMITY SPECIAL TESTS:      FUNCTIONAL TESTS:  10/24/21: 5 times sit to stand: 11.74 seconds without UE support  03/28/22: SLS time 2 sec on Left, 10 sec on Rt 05/01/22: SLS time 5 seconds on left   GAIT: Distance walked: Buyer, retail device utilized: Single point cane Level of assistance: Modified independence Comments: foot drop noted on left with trendelenburg       TODAY'S TREATMENT: 05/22/22 -Recumbent bike L7 X 8 min  -Slant board straight leg stretch 3x30 sec -Bilateral heel and toe raises 2X20 -Heel raises up with both down with left 2x10  -Tandem balance 15 sec x2 bilat  -SLS balance 10 sec X 5 bilat -Leg press DL #125 2x15, single leg #75 2x15 bilat -Side Stepping and diagonal stepping with red band around ankles in bars 5 round trips each  -Rocker board A-P x1.71mn, lateral x1.5 min with touchdown support -ankle DF,PF,EV red X 20 each  -E-stim russian/NMES for motor recruitment of peroneals/Tibialis anterior 5/5 sec on/off with intensity to tolerance and active EV/DF contraction/AROM for 5 sec hold during on phase in sidelying X 10 total minutes   05/18/22 -  Recumbent bike L5 X 8 min  -Slant board straight leg stretch 3x30 sec -Bilateral heel and toe raises 2X20 -Heel raises up with both down with left 2x10  -Tandem balance 15 sec x2 bilat  -SLS  balance 10 sec X 5 bilat -Leg press DL #125 2x15, single leg #75 2x15 bilat -Side Stepping and diagonal stepping with red band around ankles in bars 5 round trips each  -Rocker board A-P x1.28mn, lateral x1.5 min with touchdown support -ankle DF,PF,EV red X 20 each  Manual therapy (performed by BElsie RaPT,DPT)for skilled palpation and compression with Trigger Point Dry-Needling   Patient Consent Given: Yes Education handout provided: previously provided Muscles treated: left peroneal for muscle activation with Estim frequency 2 and intensity 3-4 to tolerance Treatment response/outcome: twitch response noted bilaterally  05/16/22 -Recumbent bike L5 X 8 min -Slant board straight leg stretch 3x30 sec -Bilateral heel and toe raises 2X20 -Heel raises up with both down with left 2x10  -Tandem balance 15 sec x2 bilat  -SLS balance 10 sec X 5 bilat -Leg press DL #125 2x15, single leg #75 2x15 bilat -Side Stepping and diagonal stepping with red band around ankles in bars 5 round trips each  -Tandem walking in bars with touchdown support x5 round trips  -RWinn-Dixiewith one point, 1 min each of A-P, lateral, circles with UE support  -ankle DF,PF,EV red X 20 each  -E-stim russian/NMES for motor recruitment of peroneals/Tibialis anterior 5/5 sec on/off with intensity to tolerance and active EV/DF contraction/AROM for 5 sec hold during on phase in sidelying X 10 total minutes       PATIENT EDUCATION:  Education details: exam findings and PT plan of care Person educated: Patient Education method: Explanation Education comprehension: verbalized understanding     HOME EXERCISE PROGRAM: Will review his HEP from last episode of care and revise PRN next session     ASSESSMENT:   CLINICAL IMPRESSION: He continues to show good tolerance to treatment sessions. He has one more visit scheduled after this with plans to discharge to independent program with focus on ankle strength and  stability program.   OBJECTIVE IMPAIRMENTS: decreased activity tolerance, difficulty walking, decreased balance, decreased endurance, decreased mobility, decreased ROM, decreased strength, impaired flexibility, impaired LE use, postural dysfunction, and pain.   ACTIVITY LIMITATIONS: bending, lifting, carry, locomotion, cleaning, community activity, and or occupation   PERSONAL FACTORS: Lt peroneal nerve decompression, foot drop,2020 Colon Cancer, Neuropathy in feet, PVD, prostate cancer, hernia repair are also affecting patient's functional outcome.   REHAB POTENTIAL: Fair     CLINICAL DECISION MAKING: Stable/uncomplicated   EVALUATION COMPLEXITY: Low       GOALS: Short term PT Goals Target date: 03/31/21 Pt will be I and compliant with HEP. Baseline:  Goal status: MET Pt will decrease pain by 25% overall with prolonged standing Baseline: Goal status:MET   Long term PT goals Target date: 2/29/23 Pt will improve left ankle ROM to WFL (10 deg EV and DF) in supine to improve functional mobility Baseline: Goal status: MET 04/04/22 Pt will improve  hip/knee strength to at least 5-/5 MMT in sitting, and ankle EV strength to 4/5, ankle DF strength to 4+ to improve functional strength Baseline: Goal status: MET  04/04/22 Pt will improve FOTO to at least 69% functional to show improved function Baseline: Goal status: Discontinued, incorrect body part/set up Pt will reduce pain by overall 50% overall with usual activity and prolonged standing Baseline: Goal status: MET 04/04/22 5.  Pt will improve SLS balance time on left foot to 8 seconds or more   Baseline: 2 sec but has improved to 5 sec on 05/01/22   Goal status: NEW    PLAN: PT FREQUENCY: 1-2 times per week    PT DURATION: 8-12 weeks   PLANNED INTERVENTIONS (unless contraindicated): aquatic PT, Canalith repositioning, cryotherapy, Electrical stimulation, Iontophoresis with 4 mg/ml dexamethasome, Moist heat, traction, Ultrasound, gait  training, Therapeutic exercise, balance training, neuromuscular re-education, patient/family education, prosthetic training, manual techniques, passive ROM, dry needling, taping, vasopnuematic device, vestibular, spinal manipulations, joint manipulations   PLAN FOR NEXT SESSION:  PT until end of feburary then discharge to HEP, EV strength, balance progressions, general leg strength, DN with stim and or [place] for peroneal recuitment  Roshelle Traub, Student-PT 05/23/2022, 8:43 AM

## 2022-05-25 ENCOUNTER — Encounter: Payer: Self-pay | Admitting: Physical Therapy

## 2022-05-25 ENCOUNTER — Ambulatory Visit (INDEPENDENT_AMBULATORY_CARE_PROVIDER_SITE_OTHER): Payer: Medicare Other | Admitting: Physical Therapy

## 2022-05-25 DIAGNOSIS — R262 Difficulty in walking, not elsewhere classified: Secondary | ICD-10-CM | POA: Diagnosis not present

## 2022-05-25 DIAGNOSIS — M6281 Muscle weakness (generalized): Secondary | ICD-10-CM

## 2022-05-25 DIAGNOSIS — M79662 Pain in left lower leg: Secondary | ICD-10-CM | POA: Diagnosis not present

## 2022-05-25 DIAGNOSIS — M25552 Pain in left hip: Secondary | ICD-10-CM | POA: Diagnosis not present

## 2022-05-25 NOTE — Therapy (Signed)
OUTPATIENT PHYSICAL THERAPY TREATMENT/DISCHARGE NOTE    Patient Name: Oscar Sparks MRN: [id number] DOB:26-Jun-1940, 82 y.o., male Today's Date: 05/25/2022  PHYSICAL THERAPY DISCHARGE SUMMARY  Visits from Start of Care: 29  Current functional level related to goals / functional outcomes: See below   Remaining deficits: See below   Education / Equipment: HEP  Plan:  Patient goals were all met. Patient is being discharged due to meeting all his goals and feeling ready to discharge to independent program.      END OF SESSION:   PT End of Session - 05/25/22 0939     Visit Number 53    Number of Visits 38    Date for PT Re-Evaluation 05/25/22    Authorization Type MCR    Progress Note Due on Visit 37    PT Start Time 0801    PT Stop Time [id number]    PT Time Calculation (min) 46 min    Activity Tolerance Patient tolerated treatment well    Behavior During Therapy [facility] for tasks assessed/performed                Past Medical History:  Diagnosis Date   Anemia    Arthritis    knees   Colon cancer ([place]) 09/24/2018   ED (erectile dysfunction)    Inguinal hernia    Neuromuscular disorder (HCC)    neuropathy in feet   Peripheral vascular disease ([place])    poor curculation in hands and feet hard to monitor with pulse oximetry   Prostate cancer ([place])    sees Dr. Gaynelle Arabian, received cesium seeds    Past Surgical History:  Procedure Laterality Date   BACK SURGERY     INGUINAL HERNIA REPAIR Right 05/20/2021   Procedure: OPEN RIGHT INGUINAL HERNIA REPAIR WITH MESH;  Surgeon: Jovita Kussmaul, MD;  Location: [place];  Service: General;  Laterality: Right;   SPINE SURGERY  2004   Dr. 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   PERTINENT HISTORY: Lt peroneal nerve decompression, foot drop, 2020 Colon Cancer, Neuropathy in feet, PVD, prostate cancer, hernia repair   PAIN:  Are you having pain? Yes: NPRS scale: 0/10 Pain location: left hip Pain description: inconvience Aggravating factors: prolonged standing or walking Relieving  factors: rest, NSAIDS   PRECAUTIONS: Fall   WEIGHT BEARING RESTRICTIONS No   FALLS:  Has patient fallen in last 6 months? No, but has had several near falls   OCCUPATION: retired   PLOF: Independent with basic ADLs   PATIENT GOALS improve walking and balance by reducing foot drop     OBJECTIVE:    DIAGNOSTIC FINDINGS: nothing recent     COGNITION:           Overall cognitive status: Within functional limits for tasks assessed                              LOWER EXTREMITY ROM:   Active ROM Left eval Left 11/17/21 Left 12/06/21 Left 01/10/22 Left 01/23/22 Left 02/28/22 Left 04/04/22 Left  05/01/22 Left 05/25/22   Hip flexion            Hip extension            Hip abduction            Hip adduction            Hip internal rotation            Hip external rotation            Knee flexion            Knee extension            Ankle dorsiflexion '5 7 7 7 8 10 10 10 15  '$ Ankle plantarflexion WNL     WNL WNL WNL   Ankle inversion WNL     WNL WNL WNL   Ankle eversion '5 10 10 10 10 12 13 15 16   '$ (Blank rows = not tested)   LOWER EXTREMITY MMT:   MMT in sitting/supine Left eval Left 12/06/21 Left 01/10/22 Left 01/23/22 Left 02/20/22 Left 04/04/22 Left 05/01/22 in supine Left 05/25/22  Hip flexion 3+ 4        Hip extension           Hip abduction 3 4        Hip adduction           Hip internal rotation           Hip external rotation           Knee flexion           Knee extension           Ankle dorsiflexion 4 4+   4+ '5 5 5  '$ Ankle plantarflexion 4 4+   4+ 4+ 5/ 3- in standing 5/4- standing   Ankle inversion 5 5        Ankle eversion 2+ 3- 3 3+ 3+ '4 4 4   '$ (Blank rows = not tested)   LOWER EXTREMITY SPECIAL TESTS:      FUNCTIONAL TESTS:  10/24/21: 5 times sit to stand: 11.74 seconds without UE support  03/28/22: SLS time 2 sec on Left, 10 sec on Rt 05/01/22: SLS time 5 seconds on left 05/25/22: SLS time on right 14 sec, on left 9 sec   GAIT: Distance walked: Buyer, retail device utilized: Single point cane Level of assistance: Modified independence Comments: foot drop noted on left with trendelenburg       TODAY'S TREATMENT: 05/25/22 -Recumbent bike L6 X 8 min  -Slant board straight leg stretch 3x30 sec -Bilateral heel and toe raises 2X20 -Heel raises up  with both down with left 2x10  -Tandem balance 15 sec x2 bilat  -SLS balance 10 sec X 5 bilat -Side Stepping and diagonal stepping with red band around ankles in bars 5 round trips each  -Rocker board A-P x 2 min, lateral x 2 min with touchdown support -Tandem walking on airex runway x3 round trips with touchdown  support  -Sidestepping on airex runway x3 round trips no UE support  -Leg press DL 125# 2x15, single leg 75# 2x15 bilat  -updated measurements      PATIENT EDUCATION:  Education details: exam findings and PT plan of care Person educated: Patient Education method: Explanation Education comprehension: verbalized understanding     HOME EXERCISE PROGRAM: Will review his HEP from last episode of care and revise PRN next session     ASSESSMENT:   CLINICAL IMPRESSION: He has made great progress with physical therapy. He felt ready to transition to an independent program today and felt good about his current HEP. He was given information on at home TENS/NMES unit to purchase if he would like to continue at home.    OBJECTIVE IMPAIRMENTS: decreased activity tolerance, difficulty walking, decreased balance, decreased endurance, decreased mobility, decreased ROM, decreased strength, impaired flexibility, impaired LE use, postural dysfunction, and pain.   ACTIVITY LIMITATIONS: bending, lifting, carry, locomotion, cleaning, community activity, and or occupation   PERSONAL FACTORS: Lt peroneal nerve decompression, foot drop,2020 Colon Cancer, Neuropathy in feet, PVD, prostate cancer, hernia repair are also affecting patient's functional outcome.   REHAB POTENTIAL: Fair     CLINICAL DECISION MAKING: Stable/uncomplicated   EVALUATION COMPLEXITY: Low       GOALS: Short term PT Goals Target date: 03/31/21 Pt will be I and compliant with HEP. Baseline:  Goal status: MET Pt will decrease pain by 25% overall with prolonged standing Baseline: Goal status:MET   Long term PT goals Target date: 2/29/23 Pt will improve left ankle ROM to WFL (10 deg EV and DF) in supine to improve functional mobility Baseline: Goal status: MET 04/04/22 Pt will improve  hip/knee strength to at least 5-/5 MMT in sitting, and ankle EV strength to 4/5, ankle DF strength to 4+ to improve functional  strength Baseline: Goal status: MET  04/04/22 Pt will improve FOTO to at least 69% functional to show improved function Baseline: Goal status: Discontinued, incorrect body part/set up Pt will reduce pain by overall 50% overall with usual activity and prolonged standing Baseline: Goal status: MET 04/04/22 5. Pt will improve SLS balance time on left foot to 8 seconds or more   Baseline: 2 sec but has improved to 5 sec on 05/01/22   Goal status: MET 05/25/22    PLAN: PT FREQUENCY: 1-2 times per week    PT DURATION: 8-12 weeks   PLANNED INTERVENTIONS (unless contraindicated): aquatic PT, Canalith repositioning, cryotherapy, Electrical stimulation, Iontophoresis with 4 mg/ml dexamethasome, Moist heat, traction, Ultrasound, gait training, Therapeutic exercise, balance training, neuromuscular re-education, patient/family education, prosthetic training, manual techniques, passive ROM, dry needling, taping, vasopnuematic device, vestibular, spinal manipulations, joint manipulations   PLAN FOR NEXT SESSION: discharged to independent program  Zariah Cavendish, Student-PT 05/25/2022, 9:42 AM

## 2022-06-07 DIAGNOSIS — C4361 Malignant melanoma of right upper limb, including shoulder: Secondary | ICD-10-CM | POA: Diagnosis not present

## 2022-06-07 DIAGNOSIS — D225 Melanocytic nevi of trunk: Secondary | ICD-10-CM | POA: Diagnosis not present

## 2022-06-07 DIAGNOSIS — Z85820 Personal history of malignant melanoma of skin: Secondary | ICD-10-CM | POA: Diagnosis not present

## 2022-06-07 DIAGNOSIS — Z08 Encounter for follow-up examination after completed treatment for malignant neoplasm: Secondary | ICD-10-CM | POA: Diagnosis not present

## 2022-06-07 DIAGNOSIS — L821 Other seborrheic keratosis: Secondary | ICD-10-CM | POA: Diagnosis not present

## 2022-06-21 DIAGNOSIS — D2261 Melanocytic nevi of right upper limb, including shoulder: Secondary | ICD-10-CM | POA: Diagnosis not present

## 2022-06-21 DIAGNOSIS — C4361 Malignant melanoma of right upper limb, including shoulder: Secondary | ICD-10-CM | POA: Diagnosis not present

## 2022-06-21 DIAGNOSIS — D0361 Melanoma in situ of right upper limb, including shoulder: Secondary | ICD-10-CM | POA: Diagnosis not present

## 2022-07-07 DIAGNOSIS — C4361 Malignant melanoma of right upper limb, including shoulder: Secondary | ICD-10-CM | POA: Diagnosis not present

## 2022-07-19 DIAGNOSIS — C4361 Malignant melanoma of right upper limb, including shoulder: Secondary | ICD-10-CM | POA: Diagnosis not present

## 2022-08-15 DIAGNOSIS — Z85820 Personal history of malignant melanoma of skin: Secondary | ICD-10-CM | POA: Diagnosis not present

## 2022-08-15 DIAGNOSIS — Z803 Family history of malignant neoplasm of breast: Secondary | ICD-10-CM | POA: Diagnosis not present

## 2022-08-15 DIAGNOSIS — C4361 Malignant melanoma of right upper limb, including shoulder: Secondary | ICD-10-CM | POA: Diagnosis not present

## 2022-08-23 DIAGNOSIS — C4361 Malignant melanoma of right upper limb, including shoulder: Secondary | ICD-10-CM | POA: Diagnosis not present

## 2022-08-23 DIAGNOSIS — Z09 Encounter for follow-up examination after completed treatment for conditions other than malignant neoplasm: Secondary | ICD-10-CM | POA: Diagnosis not present

## 2022-09-13 DIAGNOSIS — H2511 Age-related nuclear cataract, right eye: Secondary | ICD-10-CM | POA: Diagnosis not present

## 2022-09-13 DIAGNOSIS — H353132 Nonexudative age-related macular degeneration, bilateral, intermediate dry stage: Secondary | ICD-10-CM | POA: Diagnosis not present

## 2022-10-13 DIAGNOSIS — H2511 Age-related nuclear cataract, right eye: Secondary | ICD-10-CM | POA: Diagnosis not present

## 2022-10-20 DIAGNOSIS — H2512 Age-related nuclear cataract, left eye: Secondary | ICD-10-CM | POA: Diagnosis not present
